# Patient Record
Sex: Female | Born: 1960 | Race: White | Hispanic: No | Smoking: Former smoker
Health system: Southern US, Community
[De-identification: ages and names within clinical notes are randomized; demographics above are authoritative.]

## PROBLEM LIST (undated history)

## (undated) ENCOUNTER — Emergency Department (HOSPITAL_COMMUNITY)

## (undated) ENCOUNTER — Emergency Department (HOSPITAL_COMMUNITY): Admission: EM | Discharge status: Absent without leave | Payer: Medicare HMO | Source: Home / Self Care

## (undated) DIAGNOSIS — M419 Scoliosis, unspecified: Secondary | ICD-10-CM

## (undated) DIAGNOSIS — E785 Hyperlipidemia, unspecified: Secondary | ICD-10-CM

## (undated) DIAGNOSIS — J439 Emphysema, unspecified: Secondary | ICD-10-CM

## (undated) DIAGNOSIS — K219 Gastro-esophageal reflux disease without esophagitis: Secondary | ICD-10-CM

## (undated) DIAGNOSIS — Z9981 Dependence on supplemental oxygen: Secondary | ICD-10-CM

## (undated) DIAGNOSIS — R0902 Hypoxemia: Secondary | ICD-10-CM

## (undated) DIAGNOSIS — F329 Major depressive disorder, single episode, unspecified: Secondary | ICD-10-CM

## (undated) DIAGNOSIS — C801 Malignant (primary) neoplasm, unspecified: Secondary | ICD-10-CM

## (undated) DIAGNOSIS — Z923 Personal history of irradiation: Secondary | ICD-10-CM

## (undated) DIAGNOSIS — F32A Depression, unspecified: Secondary | ICD-10-CM

## (undated) DIAGNOSIS — F419 Anxiety disorder, unspecified: Secondary | ICD-10-CM

## (undated) DIAGNOSIS — J449 Chronic obstructive pulmonary disease, unspecified: Secondary | ICD-10-CM

## (undated) HISTORY — DX: Gastro-esophageal reflux disease without esophagitis: K21.9

## (undated) HISTORY — PX: TUBAL LIGATION: SHX77

## (undated) HISTORY — DX: Hypoxemia: R09.02

## (undated) HISTORY — DX: Scoliosis, unspecified: M41.9

## (undated) HISTORY — DX: Emphysema, unspecified: J43.9

---

## 2002-01-09 ENCOUNTER — Encounter: Payer: Self-pay | Admitting: Internal Medicine

## 2002-01-09 ENCOUNTER — Ambulatory Visit (HOSPITAL_COMMUNITY): Admission: RE | Admit: 2002-01-09 | Discharge: 2002-01-09 | Payer: Self-pay | Admitting: Internal Medicine

## 2002-09-29 ENCOUNTER — Ambulatory Visit (HOSPITAL_COMMUNITY): Admission: RE | Admit: 2002-09-29 | Discharge: 2002-09-29 | Payer: Self-pay | Admitting: Internal Medicine

## 2002-09-29 ENCOUNTER — Encounter: Payer: Self-pay | Admitting: Internal Medicine

## 2002-10-03 ENCOUNTER — Ambulatory Visit (HOSPITAL_COMMUNITY): Admission: RE | Admit: 2002-10-03 | Discharge: 2002-10-03 | Payer: Self-pay | Admitting: Internal Medicine

## 2002-10-16 ENCOUNTER — Ambulatory Visit (HOSPITAL_COMMUNITY): Admission: RE | Admit: 2002-10-16 | Discharge: 2002-10-16 | Payer: Self-pay | Admitting: Internal Medicine

## 2003-01-11 ENCOUNTER — Ambulatory Visit (HOSPITAL_COMMUNITY): Admission: RE | Admit: 2003-01-11 | Discharge: 2003-01-11 | Payer: Self-pay | Admitting: Internal Medicine

## 2003-01-18 ENCOUNTER — Ambulatory Visit (HOSPITAL_COMMUNITY): Admission: RE | Admit: 2003-01-18 | Discharge: 2003-01-18 | Payer: Self-pay | Admitting: Internal Medicine

## 2004-01-14 ENCOUNTER — Ambulatory Visit (HOSPITAL_COMMUNITY): Admission: RE | Admit: 2004-01-14 | Discharge: 2004-01-14 | Payer: Self-pay | Admitting: Internal Medicine

## 2005-01-20 ENCOUNTER — Ambulatory Visit (HOSPITAL_COMMUNITY): Admission: RE | Admit: 2005-01-20 | Discharge: 2005-01-20 | Payer: Self-pay | Admitting: Internal Medicine

## 2006-01-22 ENCOUNTER — Ambulatory Visit (HOSPITAL_COMMUNITY): Admission: RE | Admit: 2006-01-22 | Discharge: 2006-01-22 | Payer: Self-pay | Admitting: Internal Medicine

## 2007-01-24 ENCOUNTER — Ambulatory Visit (HOSPITAL_COMMUNITY): Admission: RE | Admit: 2007-01-24 | Discharge: 2007-01-24 | Payer: Self-pay | Admitting: Internal Medicine

## 2007-06-06 ENCOUNTER — Other Ambulatory Visit: Admission: RE | Admit: 2007-06-06 | Discharge: 2007-06-06 | Payer: Self-pay | Admitting: Obstetrics and Gynecology

## 2007-06-26 ENCOUNTER — Emergency Department (HOSPITAL_COMMUNITY): Admission: EM | Admit: 2007-06-26 | Discharge: 2007-06-26 | Payer: Self-pay | Admitting: Emergency Medicine

## 2008-02-15 ENCOUNTER — Ambulatory Visit (HOSPITAL_COMMUNITY): Admission: RE | Admit: 2008-02-15 | Discharge: 2008-02-15 | Payer: Self-pay | Admitting: Family Medicine

## 2009-01-07 ENCOUNTER — Other Ambulatory Visit: Admission: RE | Admit: 2009-01-07 | Discharge: 2009-01-07 | Payer: Self-pay | Admitting: Obstetrics & Gynecology

## 2009-02-18 ENCOUNTER — Ambulatory Visit (HOSPITAL_COMMUNITY): Admission: RE | Admit: 2009-02-18 | Discharge: 2009-02-18 | Payer: Self-pay | Admitting: Family Medicine

## 2010-01-24 ENCOUNTER — Other Ambulatory Visit (HOSPITAL_COMMUNITY): Payer: Self-pay | Admitting: Family Medicine

## 2010-01-24 DIAGNOSIS — Z1239 Encounter for other screening for malignant neoplasm of breast: Secondary | ICD-10-CM

## 2010-01-26 ENCOUNTER — Encounter: Payer: Self-pay | Admitting: Family Medicine

## 2010-02-24 ENCOUNTER — Ambulatory Visit (HOSPITAL_COMMUNITY): Payer: Medicare Other

## 2010-05-05 ENCOUNTER — Ambulatory Visit (HOSPITAL_COMMUNITY)
Admission: RE | Admit: 2010-05-05 | Discharge: 2010-05-05 | Disposition: A | Discharge status: Home / Self Care | Payer: Medicare Other | Source: Ambulatory Visit | Attending: Family Medicine | Admitting: Family Medicine

## 2010-05-05 ENCOUNTER — Other Ambulatory Visit (HOSPITAL_COMMUNITY): Payer: Self-pay | Admitting: Family Medicine

## 2010-05-05 DIAGNOSIS — R634 Abnormal weight loss: Secondary | ICD-10-CM

## 2010-05-05 DIAGNOSIS — R059 Cough, unspecified: Secondary | ICD-10-CM | POA: Insufficient documentation

## 2010-05-05 DIAGNOSIS — F17200 Nicotine dependence, unspecified, uncomplicated: Secondary | ICD-10-CM | POA: Insufficient documentation

## 2010-05-05 DIAGNOSIS — R05 Cough: Secondary | ICD-10-CM | POA: Insufficient documentation

## 2010-05-19 ENCOUNTER — Ambulatory Visit (HOSPITAL_COMMUNITY): Payer: Medicare Other

## 2010-05-23 NOTE — Procedures (Signed)
   NAMELISSANDRA, Donna Watkins NO.:  [id number]   MEDICAL RECORD NO.:  [id number]                    PATIENT TYPE:   LOCATION:                                       FACILITY:   PHYSICIAN:  Edward L. Juanetta Gosling, M.D.             DATE OF BIRTH:   DATE OF PROCEDURE:  DATE OF DISCHARGE:                              PULMONARY FUNCTION TEST   IMPRESSION:  1. Spirometry shows a moderate ventilatory defect with evidence of airflow     obstruction.  2. Lung  volumes show no restrictive change, but marked air trapping.  3. DLCO is severely reduced.  4. Arterial blood gas shows marked resting hypoxemia as well as increased     pCO2.      ___________________________________________                                            Oneal Deputy. Juanetta Gosling, M.D.   ELH/MEDQ  D:  10/06/2002  T:  10/06/2002  Job:  [id number]   cc:   Tesfaye D. Felecia Shelling, M.D.  [address]  [place]  [place] [address]  Fax: [phone]

## 2010-05-26 ENCOUNTER — Ambulatory Visit (HOSPITAL_COMMUNITY)
Admission: RE | Admit: 2010-05-26 | Discharge: 2010-05-26 | Disposition: A | Discharge status: Home / Self Care | Payer: Medicare Other | Source: Ambulatory Visit | Attending: Family Medicine | Admitting: Family Medicine

## 2010-05-26 DIAGNOSIS — Z1231 Encounter for screening mammogram for malignant neoplasm of breast: Secondary | ICD-10-CM | POA: Insufficient documentation

## 2010-05-26 DIAGNOSIS — Z1239 Encounter for other screening for malignant neoplasm of breast: Secondary | ICD-10-CM

## 2010-10-02 LAB — DIFFERENTIAL
Basophils Absolute: 0.1
Basophils Relative: 1
Eosinophils Absolute: 0
Eosinophils Relative: 0
Lymphocytes Relative: 17
Monocytes Absolute: 0.5
Monocytes Relative: 4
Neutrophils Relative %: 77

## 2010-10-02 LAB — POCT CARDIAC MARKERS
CKMB, poc: 1.2
Myoglobin, poc: 60.2
Operator id: 237661
Troponin i, poc: <0.05

## 2010-10-02 LAB — BASIC METABOLIC PANEL
BUN: 4 — ABNORMAL LOW
CO2: 30
Calcium: 8.9
Chloride: 105
Creatinine, Ser: 0.64
GFR calc Af Amer: >60
GFR calc non Af Amer: >60
Glucose, Bld: 112 — ABNORMAL HIGH
Potassium: 3.6
Sodium: 139

## 2010-10-02 LAB — CBC
HCT: 48.2 — ABNORMAL HIGH
Hemoglobin: 16.4 — ABNORMAL HIGH
MCHC: 34.1
MCV: 93.3
Platelets: 256
RBC: 5.16 — ABNORMAL HIGH
RDW: 13.3
WBC: 10.8 — ABNORMAL HIGH

## 2011-06-12 ENCOUNTER — Inpatient Hospital Stay (HOSPITAL_COMMUNITY)
Admission: EM | Admit: 2011-06-12 | Discharge: 2011-06-16 | DRG: 189 | Disposition: A | Discharge status: Home / Self Care | Payer: Medicare Other | Attending: Family Medicine | Admitting: Family Medicine

## 2011-06-12 ENCOUNTER — Encounter (HOSPITAL_COMMUNITY): Payer: Self-pay | Admitting: *Deleted

## 2011-06-12 ENCOUNTER — Emergency Department (HOSPITAL_COMMUNITY): Payer: Medicare Other

## 2011-06-12 DIAGNOSIS — I498 Other specified cardiac arrhythmias: Secondary | ICD-10-CM | POA: Diagnosis present

## 2011-06-12 DIAGNOSIS — G935 Compression of brain: Secondary | ICD-10-CM | POA: Diagnosis present

## 2011-06-12 DIAGNOSIS — R911 Solitary pulmonary nodule: Secondary | ICD-10-CM | POA: Diagnosis present

## 2011-06-12 DIAGNOSIS — Z79899 Other long term (current) drug therapy: Secondary | ICD-10-CM

## 2011-06-12 DIAGNOSIS — J9600 Acute respiratory failure, unspecified whether with hypoxia or hypercapnia: Secondary | ICD-10-CM | POA: Diagnosis not present

## 2011-06-12 DIAGNOSIS — E876 Hypokalemia: Secondary | ICD-10-CM | POA: Diagnosis not present

## 2011-06-12 DIAGNOSIS — D509 Iron deficiency anemia, unspecified: Secondary | ICD-10-CM | POA: Diagnosis present

## 2011-06-12 DIAGNOSIS — E860 Dehydration: Secondary | ICD-10-CM | POA: Diagnosis present

## 2011-06-12 DIAGNOSIS — D751 Secondary polycythemia: Secondary | ICD-10-CM | POA: Diagnosis present

## 2011-06-12 DIAGNOSIS — J9692 Respiratory failure, unspecified with hypercapnia: Secondary | ICD-10-CM

## 2011-06-12 DIAGNOSIS — F329 Major depressive disorder, single episode, unspecified: Secondary | ICD-10-CM | POA: Diagnosis present

## 2011-06-12 DIAGNOSIS — J449 Chronic obstructive pulmonary disease, unspecified: Secondary | ICD-10-CM

## 2011-06-12 DIAGNOSIS — J441 Chronic obstructive pulmonary disease with (acute) exacerbation: Secondary | ICD-10-CM | POA: Diagnosis present

## 2011-06-12 DIAGNOSIS — D7589 Other specified diseases of blood and blood-forming organs: Secondary | ICD-10-CM | POA: Diagnosis present

## 2011-06-12 DIAGNOSIS — I959 Hypotension, unspecified: Secondary | ICD-10-CM | POA: Diagnosis present

## 2011-06-12 DIAGNOSIS — IMO0002 Reserved for concepts with insufficient information to code with codable children: Secondary | ICD-10-CM

## 2011-06-12 DIAGNOSIS — R0602 Shortness of breath: Secondary | ICD-10-CM | POA: Diagnosis not present

## 2011-06-12 DIAGNOSIS — J9611 Chronic respiratory failure with hypoxia: Secondary | ICD-10-CM | POA: Diagnosis present

## 2011-06-12 DIAGNOSIS — R519 Headache, unspecified: Secondary | ICD-10-CM | POA: Diagnosis present

## 2011-06-12 DIAGNOSIS — J9620 Acute and chronic respiratory failure, unspecified whether with hypoxia or hypercapnia: Principal | ICD-10-CM | POA: Diagnosis present

## 2011-06-12 DIAGNOSIS — R634 Abnormal weight loss: Secondary | ICD-10-CM | POA: Diagnosis present

## 2011-06-12 DIAGNOSIS — J9601 Acute respiratory failure with hypoxia: Secondary | ICD-10-CM | POA: Diagnosis present

## 2011-06-12 DIAGNOSIS — F341 Dysthymic disorder: Secondary | ICD-10-CM | POA: Diagnosis present

## 2011-06-12 DIAGNOSIS — R51 Headache: Secondary | ICD-10-CM | POA: Diagnosis not present

## 2011-06-12 DIAGNOSIS — Q0700 Arnold-Chiari syndrome without spina bifida or hydrocephalus: Secondary | ICD-10-CM | POA: Diagnosis present

## 2011-06-12 DIAGNOSIS — F32A Depression, unspecified: Secondary | ICD-10-CM | POA: Diagnosis present

## 2011-06-12 DIAGNOSIS — J4489 Other specified chronic obstructive pulmonary disease: Secondary | ICD-10-CM | POA: Diagnosis not present

## 2011-06-12 DIAGNOSIS — R0609 Other forms of dyspnea: Secondary | ICD-10-CM | POA: Diagnosis not present

## 2011-06-12 DIAGNOSIS — R0902 Hypoxemia: Secondary | ICD-10-CM | POA: Diagnosis present

## 2011-06-12 DIAGNOSIS — R001 Bradycardia, unspecified: Secondary | ICD-10-CM | POA: Diagnosis present

## 2011-06-12 DIAGNOSIS — J9612 Chronic respiratory failure with hypercapnia: Secondary | ICD-10-CM

## 2011-06-12 HISTORY — DX: Depression, unspecified: F32.A

## 2011-06-12 HISTORY — DX: Hyperlipidemia, unspecified: E78.5

## 2011-06-12 HISTORY — DX: Major depressive disorder, single episode, unspecified: F32.9

## 2011-06-12 HISTORY — DX: Anxiety disorder, unspecified: F41.9

## 2011-06-12 HISTORY — DX: Chronic obstructive pulmonary disease, unspecified: J44.9

## 2011-06-12 MED ORDER — METHYLPREDNISOLONE SODIUM SUCC 125 MG IJ SOLR
125.0000 mg | Freq: Once | INTRAMUSCULAR | Status: AC
  Administered 2011-06-13: 125 mg via INTRAVENOUS
  Filled 2011-06-12: qty 2

## 2011-06-12 MED ORDER — ALBUTEROL SULFATE (5 MG/ML) 0.5% IN NEBU
2.5000 mg | INHALATION_SOLUTION | Freq: Once | RESPIRATORY_TRACT | Status: AC
  Administered 2011-06-12: 2.5 mg via RESPIRATORY_TRACT
  Filled 2011-06-12: qty 0.5

## 2011-06-12 MED ORDER — IPRATROPIUM BROMIDE 0.02 % IN SOLN
0.5000 mg | Freq: Once | RESPIRATORY_TRACT | Status: AC
  Administered 2011-06-12: 0.5 mg via RESPIRATORY_TRACT
  Filled 2011-06-12: qty 2.5

## 2011-06-12 MED ORDER — SODIUM CHLORIDE 0.9 % IV SOLN
INTRAVENOUS | Status: DC
  Administered 2011-06-13: via INTRAVENOUS

## 2011-06-12 NOTE — ED Provider Notes (Addendum)
History   This chart was scribed for Shelda Jakes, MD by Charolett Bumpers . The patient was seen in room APA08/APA08.    CSN: [id number]  Arrival date & time 06/12/11  2306   First MD Initiated Contact with Patient 06/12/11 2308      Chief Complaint  Patient presents with  . Shortness of Breath    (Consider location/radiation/quality/duration/timing/severity/associated sxs/prior treatment) HPI Donna Watkins is a 51 y.o. female who presents to the Emergency Department complaining of intermittent, moderate SOB for the past 2 months, with it worsening over the last 2 nights. Patient reports a h/o COPD and Emphysema. Patient state that she uses an inhaler, but denies using oxygen at home. Patient denies taking prednisone. Patient reports an associated productive cough and diaphoresis. Patient also reports diarrhea, unexpected weight loss and loss of appetite for the past several weeks and states Dr. Sudie Bailey is aware. Patient also reports an associated headache for the past several weeks. Patient states that she is a smoker, pack/day. Patient states that she has breathing treatment regularly, but states she did not do a breathing treatment today.   PCP: Dr. Sudie Bailey  No past medical history on file.  No past surgical history on file.  No family history on file.  History  Substance Use Topics  . Smoking status: Not on file  . Smokeless tobacco: Not on file  . Alcohol Use: Not on file    OB History    No data available      Review of Systems  Constitutional: Positive for diaphoresis, appetite change and unexpected weight change. Negative for fever.  HENT: Negative for congestion, sore throat and neck pain.   Respiratory: Positive for cough and shortness of breath.   Cardiovascular: Negative for chest pain and leg swelling.  Gastrointestinal: Positive for diarrhea. Negative for nausea, vomiting and abdominal pain.  Genitourinary: Negative for dysuria.    Musculoskeletal: Negative for back pain.  Skin: Negative for rash.  Neurological: Positive for headaches.  All other systems reviewed and are negative.    Allergies  Review of patient's allergies indicates not on file.  Home Medications  No current outpatient prescriptions on file.  BP 122/77  Pulse 98  Temp 98.7 F (37.1 C)  Resp 24  Ht 5\' 3"  (1.6 m)  Wt 133 lb (60.328 kg)  BMI 23.56 kg/m2  SpO2 70%  Physical Exam  Nursing note and vitals reviewed. Constitutional: She is oriented to person, place, and time. She appears well-developed and well-nourished. No distress.  HENT:  Head: Normocephalic and atraumatic.  Mouth/Throat: Oropharynx is clear and moist.       Mucous membranes moist.   Eyes: EOM are normal. Pupils are equal, round, and reactive to light.  Neck: Normal range of motion. Neck supple. No tracheal deviation present.  Cardiovascular: Normal rate, regular rhythm and normal heart sounds.   No murmur heard. Pulmonary/Chest: No respiratory distress. She has no wheezes.       Minimal air movement.   Abdominal: Soft. Bowel sounds are normal. She exhibits no distension. There is no tenderness.  Musculoskeletal: Normal range of motion. She exhibits no edema.  Lymphadenopathy:    She has no cervical adenopathy.  Neurological: She is alert and oriented to person, place, and time. No cranial nerve deficit or sensory deficit.  Skin: Skin is warm and dry.       Cap refill is 1 sec. Lips were initially cyanotic, but resolved with oxygen.   Psychiatric: She  has a normal mood and affect. Her behavior is normal.    ED Course  Procedures (including critical care time)  DIAGNOSTIC STUDIES: Oxygen Saturation is 70% on 6 L [place], low by my interpretation.    COORDINATION OF CARE:  2332: Discussed planned course of treatment with the patient and family, who is agreeable at this time.  2345: Medication Orders: Methylprednisolone sodium succinate (Solu-medrol) 125 mg/2 mL  injection 125 mg-once; Ipratropium (Atrovent) nebulizer solution 0.5 mg-once; Albuterol (Proventil) (5 mg/mL) 0.5% nebulizer solution 2.5 mg-once 0000: Medication Orders: 0.9% Sodium chloride infusion-continuous.    Labs Reviewed - No data to display No results found.   No diagnosis found.  CRITICAL CARE Performed by: Shelda Jakes.   Total critical care time: 60  Critical care time was exclusive of separately billable procedures and treating other patients.  Critical care was necessary to treat or prevent imminent or life-threatening deterioration.  Critical care was time spent personally by me on the following activities: development of treatment plan with patient and/or surrogate as well as nursing, discussions with consultants, evaluation of patient's response to treatment, examination of patient, obtaining history from patient or surrogate, ordering and performing treatments and interventions, ordering and review of laboratory studies, ordering and review of radiographic studies, pulse oximetry and re-evaluation of patient's condition.   MDM  Patient with known history of COPD. Arrived cyanotic room air oxygen saturation was 70%. Patient was alert and mentating and oriented. In ED started on 4 L of oxygen she's and sats only up into the 80s oxygen raised to 6 L oxygen saturation slowly improved to around 90%. No wheezing on exam however poor air movement bilaterally. Patient does have a nebulizer treatments at home has been using them. Patient also with a complaint of headache for 2 weeks mild not severe no injury. The headaches resulted in getting a head CT thinking more in terms since a heavy smoker possible metastatic disease to the brain. Head CT raised concern for subarachnoid hemorrhage and/or edema. This did not fit her clinical scenario. Discussed with neurosurgery at [place] they were not overly impressed with a head CT did not feel that it is a subarachnoid hemorrhage  there is significant edema however do agree it's not normal and recommended MRI/MRA when possible. Felt the patient could be admitted here. For the pulmonary stuff the patient was started on BiPAP initial blood gas showed a markedly elevated PCO2 surprisingly patient mentating fine suspect that the elevated PCO2 has been chronic and not acute. On the BiPAP patient improves significantly oxygen sats up and skin color improved to a pink color once again patient was never in any acute distress never showed any focal neural deficits or acute mental status changes. Repeat blood gas after being on the BiPAP showed a PCO2 of 71 PO2 of 178. Patient's pH was not significantly acidotic. We'll get CT angios to rule out PE had discussed the case with the hospitalist they are admitting for Dr. Sudie Bailey this week he is on vacation. After this CT angios will recontact them they are concerned about the head CT findings have confirmed with radiology here the MRI will not be available over the weekend patient may need to be transferred to [facility] for admission to the internal medicine hospitalist service there. Updates to come.  CT angiogram negative for pulmonary embolism or any other significant pulmonary findings other than evidence of emphysema. Hospitalist will come and see the patient here will most likely transfer patient to [facility] portion  get an MRI when off the BiPAP. Did confirm not able to get an MRI here all weekend long. Patient remains alert tolerating BiPAP fine.   I personally performed the services described in this documentation, which was scribed in my presence. The recorded information has been reviewed and considered.         Shelda Jakes, MD 06/13/11 1610  Shelda Jakes, MD 06/13/11 2142

## 2011-06-12 NOTE — ED Notes (Signed)
O2 Saturation increased to 94 on 6lpm.

## 2011-06-12 NOTE — ED Notes (Addendum)
Pt reports increasing SOB.  Also reporting diarrhea and loss of appetite for several days.  Pt's lips very cyanotic.  Placed on oxygen. Pt denies home 02.  Reports she has been using nebulizer treatments. Pt O2 saturations 40% on room air.

## 2011-06-12 NOTE — ED Notes (Signed)
EDP at bedside. Pt O2 Saturations increased to 87% on 6 lpm.

## 2011-06-13 ENCOUNTER — Encounter (HOSPITAL_COMMUNITY): Payer: Self-pay | Admitting: Internal Medicine

## 2011-06-13 ENCOUNTER — Inpatient Hospital Stay (HOSPITAL_COMMUNITY): Payer: Medicare Other

## 2011-06-13 ENCOUNTER — Emergency Department (HOSPITAL_COMMUNITY): Payer: Medicare Other

## 2011-06-13 DIAGNOSIS — D7589 Other specified diseases of blood and blood-forming organs: Secondary | ICD-10-CM | POA: Diagnosis present

## 2011-06-13 DIAGNOSIS — E876 Hypokalemia: Secondary | ICD-10-CM | POA: Diagnosis present

## 2011-06-13 DIAGNOSIS — R634 Abnormal weight loss: Secondary | ICD-10-CM | POA: Diagnosis present

## 2011-06-13 DIAGNOSIS — J9601 Acute respiratory failure with hypoxia: Secondary | ICD-10-CM | POA: Diagnosis present

## 2011-06-13 DIAGNOSIS — J9612 Chronic respiratory failure with hypercapnia: Secondary | ICD-10-CM | POA: Diagnosis present

## 2011-06-13 DIAGNOSIS — J441 Chronic obstructive pulmonary disease with (acute) exacerbation: Secondary | ICD-10-CM | POA: Diagnosis present

## 2011-06-13 DIAGNOSIS — J9611 Chronic respiratory failure with hypoxia: Secondary | ICD-10-CM | POA: Diagnosis present

## 2011-06-13 DIAGNOSIS — R0602 Shortness of breath: Secondary | ICD-10-CM | POA: Diagnosis not present

## 2011-06-13 DIAGNOSIS — F329 Major depressive disorder, single episode, unspecified: Secondary | ICD-10-CM | POA: Diagnosis present

## 2011-06-13 DIAGNOSIS — F32A Depression, unspecified: Secondary | ICD-10-CM | POA: Diagnosis present

## 2011-06-13 DIAGNOSIS — R519 Headache, unspecified: Secondary | ICD-10-CM | POA: Diagnosis present

## 2011-06-13 DIAGNOSIS — R51 Headache: Secondary | ICD-10-CM | POA: Diagnosis present

## 2011-06-13 DIAGNOSIS — Q0700 Arnold-Chiari syndrome without spina bifida or hydrocephalus: Secondary | ICD-10-CM | POA: Diagnosis present

## 2011-06-13 LAB — HEPATIC FUNCTION PANEL
ALT: 13 U/L (ref 0–35)
AST: 15 U/L (ref 0–37)
Albumin: 3.4 g/dL — ABNORMAL LOW (ref 3.5–5.2)
Alkaline Phosphatase: 66 U/L (ref 39–117)
Bilirubin, Direct: 0.2 mg/dL (ref 0.0–0.3)
Indirect Bilirubin: 0.7 mg/dL (ref 0.3–0.9)
Total Protein: 6.5 g/dL (ref 6.0–8.3)

## 2011-06-13 LAB — BLOOD GAS, ARTERIAL
Acid-Base Excess: 18.2 mmol/L — ABNORMAL HIGH (ref 0.0–2.0)
Bicarbonate: 41.3 mEq/L — ABNORMAL HIGH (ref 20.0–24.0)
Bicarbonate: 44.5 mEq/L — ABNORMAL HIGH (ref 20.0–24.0)
Delivery systems: POSITIVE
O2 Content: 6 L/min
O2 Saturation: 89.7 %
O2 Saturation: 98.9 %
Patient temperature: 37
TCO2: 34.4 mmol/L (ref 0–100)
pCO2 arterial: 71.9 mmHg (ref 35.0–45.0)
pCO2 arterial: 78.8 mmHg (ref 35.0–45.0)
pH, Arterial: 7.377 (ref 7.350–7.400)
pO2, Arterial: 63.5 mmHg — ABNORMAL LOW (ref 80.0–100.0)

## 2011-06-13 LAB — URINALYSIS, ROUTINE W REFLEX MICROSCOPIC
Glucose, UA: NEGATIVE mg/dL
Ketones, ur: NEGATIVE mg/dL
Leukocytes, UA: NEGATIVE
Nitrite: NEGATIVE
Protein, ur: 30 mg/dL — AB
Specific Gravity, Urine: >1.03 — ABNORMAL HIGH (ref 1.005–1.030)
pH: 6 (ref 5.0–8.0)

## 2011-06-13 LAB — URINE MICROSCOPIC-ADD ON

## 2011-06-13 LAB — BASIC METABOLIC PANEL
BUN: 8 mg/dL (ref 6–23)
Chloride: 91 mEq/L — ABNORMAL LOW (ref 96–112)
Glucose, Bld: 128 mg/dL — ABNORMAL HIGH (ref 70–99)
Potassium: 2.8 mEq/L — ABNORMAL LOW (ref 3.5–5.1)
Sodium: 141 mEq/L (ref 135–145)

## 2011-06-13 LAB — DIFFERENTIAL
Basophils Absolute: 0 10*3/uL (ref 0.0–0.1)
Basophils Relative: 0 % (ref 0–1)
Eosinophils Absolute: 0 10*3/uL (ref 0.0–0.7)
Eosinophils Relative: 1 % (ref 0–5)
Lymphocytes Relative: 22 % (ref 12–46)
Lymphs Abs: 1.1 10*3/uL (ref 0.7–4.0)
Monocytes Absolute: 0.4 10*3/uL (ref 0.1–1.0)
Monocytes Relative: 8 % (ref 3–12)
Neutrophils Relative %: 69 % (ref 43–77)

## 2011-06-13 LAB — CBC
Hemoglobin: 18.9 g/dL — ABNORMAL HIGH (ref 12.0–15.0)
MCH: 32.6 pg (ref 26.0–34.0)
MCHC: 31.8 g/dL (ref 30.0–36.0)
MCV: 102.6 fL — ABNORMAL HIGH (ref 78.0–100.0)
Platelets: 180 10*3/uL (ref 150–400)
WBC: 5.1 10*3/uL (ref 4.0–10.5)

## 2011-06-13 LAB — MRSA PCR SCREENING: MRSA by PCR: NEGATIVE

## 2011-06-13 LAB — MAGNESIUM: Magnesium: 1.6 mg/dL (ref 1.5–2.5)

## 2011-06-13 LAB — TSH: TSH: 0.428 u[IU]/mL (ref 0.350–4.500)

## 2011-06-13 MED ORDER — BIOTENE DRY MOUTH MT LIQD
15.0000 mL | Freq: Two times a day (BID) | OROMUCOSAL | Status: DC
  Administered 2011-06-14 – 2011-06-16 (×5): 15 mL via OROMUCOSAL

## 2011-06-13 MED ORDER — ONDANSETRON HCL 4 MG/2ML IJ SOLN: 4.0000 mg | Freq: Four times a day (QID) | INTRAMUSCULAR | Status: DC | PRN

## 2011-06-13 MED ORDER — ACETAMINOPHEN 325 MG PO TABS: 650.0000 mg | ORAL_TABLET | Freq: Four times a day (QID) | ORAL | Status: DC | PRN

## 2011-06-13 MED ORDER — LEVOFLOXACIN 500 MG PO TABS
500.0000 mg | ORAL_TABLET | ORAL | Status: DC
  Administered 2011-06-14 – 2011-06-16 (×3): 500 mg via ORAL
  Filled 2011-06-13 (×4): qty 1

## 2011-06-13 MED ORDER — POTASSIUM CHLORIDE CRYS ER 20 MEQ PO TBCR
40.0000 meq | EXTENDED_RELEASE_TABLET | Freq: Once | ORAL | Status: AC
  Administered 2011-06-13: 40 meq via ORAL
  Filled 2011-06-13: qty 2

## 2011-06-13 MED ORDER — ALBUTEROL SULFATE (5 MG/ML) 0.5% IN NEBU
2.5000 mg | INHALATION_SOLUTION | RESPIRATORY_TRACT | Status: DC
  Administered 2011-06-13 – 2011-06-15 (×13): 2.5 mg via RESPIRATORY_TRACT
  Filled 2011-06-13 (×13): qty 0.5

## 2011-06-13 MED ORDER — ALPRAZOLAM 0.5 MG PO TABS: 0.5000 mg | ORAL_TABLET | Freq: Three times a day (TID) | ORAL | Status: DC | PRN

## 2011-06-13 MED ORDER — POTASSIUM CHLORIDE 10 MEQ/100ML IV SOLN
10.0000 meq | Freq: Once | INTRAVENOUS | Status: AC
  Administered 2011-06-13: 10 meq via INTRAVENOUS

## 2011-06-13 MED ORDER — FLUTICASONE-SALMETEROL 250-50 MCG/DOSE IN AEPB
1.0000 | INHALATION_SPRAY | Freq: Two times a day (BID) | RESPIRATORY_TRACT | Status: DC
  Administered 2011-06-13 – 2011-06-15 (×4): 1 via RESPIRATORY_TRACT
  Filled 2011-06-13: qty 14

## 2011-06-13 MED ORDER — SODIUM CHLORIDE 0.9 % IV BOLUS (SEPSIS)
500.0000 mL | Freq: Once | INTRAVENOUS | Status: AC
  Administered 2011-06-13: 500 mL via INTRAVENOUS

## 2011-06-13 MED ORDER — ONDANSETRON HCL 4 MG PO TABS: 4.0000 mg | ORAL_TABLET | Freq: Four times a day (QID) | ORAL | Status: DC | PRN

## 2011-06-13 MED ORDER — GADOBENATE DIMEGLUMINE 529 MG/ML IV SOLN
13.0000 mL | Freq: Once | INTRAVENOUS | Status: AC | PRN
  Administered 2011-06-13: 13 mL via INTRAVENOUS

## 2011-06-13 MED ORDER — ALBUTEROL SULFATE (5 MG/ML) 0.5% IN NEBU: 2.5000 mg | INHALATION_SOLUTION | RESPIRATORY_TRACT | Status: DC | PRN

## 2011-06-13 MED ORDER — LEVOFLOXACIN IN D5W 500 MG/100ML IV SOLN
500.0000 mg | Freq: Once | INTRAVENOUS | Status: AC
  Administered 2011-06-13: 500 mg via INTRAVENOUS
  Filled 2011-06-13: qty 100

## 2011-06-13 MED ORDER — PANTOPRAZOLE SODIUM 40 MG PO TBEC
40.0000 mg | DELAYED_RELEASE_TABLET | Freq: Every day | ORAL | Status: DC
  Administered 2011-06-13 – 2011-06-16 (×4): 40 mg via ORAL
  Filled 2011-06-13 (×5): qty 1

## 2011-06-13 MED ORDER — OXYCODONE HCL 5 MG PO TABS: 5.0000 mg | ORAL_TABLET | ORAL | Status: DC | PRN

## 2011-06-13 MED ORDER — POTASSIUM CHLORIDE 10 MEQ/100ML IV SOLN
10.0000 meq | Freq: Once | INTRAVENOUS | Status: AC
Start: 2011-06-13 — End: 2011-06-13
  Administered 2011-06-13: 10 meq via INTRAVENOUS
  Filled 2011-06-13 (×2): qty 100

## 2011-06-13 MED ORDER — NICOTINE 21 MG/24HR TD PT24
21.0000 mg | MEDICATED_PATCH | Freq: Every day | TRANSDERMAL | Status: DC
  Administered 2011-06-13 – 2011-06-15 (×3): 21 mg via TRANSDERMAL
  Filled 2011-06-13 (×4): qty 1

## 2011-06-13 MED ORDER — LEVOFLOXACIN IN D5W 500 MG/100ML IV SOLN: 500.0000 mg | INTRAVENOUS | Status: DC

## 2011-06-13 MED ORDER — ACETAMINOPHEN 650 MG RE SUPP: 650.0000 mg | Freq: Four times a day (QID) | RECTAL | Status: DC | PRN

## 2011-06-13 MED ORDER — METHYLPREDNISOLONE SODIUM SUCC 125 MG IJ SOLR
80.0000 mg | Freq: Four times a day (QID) | INTRAMUSCULAR | Status: DC
  Administered 2011-06-13 – 2011-06-14 (×5): 80 mg via INTRAVENOUS
  Filled 2011-06-13 (×2): qty 2
  Filled 2011-06-13: qty 1.28
  Filled 2011-06-13: qty 2
  Filled 2011-06-13 (×4): qty 1.28

## 2011-06-13 MED ORDER — ALPRAZOLAM 0.5 MG PO TABS
1.0000 mg | ORAL_TABLET | Freq: Four times a day (QID) | ORAL | Status: DC | PRN
  Administered 2011-06-13 – 2011-06-16 (×10): 1 mg via ORAL
  Filled 2011-06-13 (×11): qty 2

## 2011-06-13 MED ORDER — IOHEXOL 350 MG/ML SOLN
100.0000 mL | Freq: Once | INTRAVENOUS | Status: AC | PRN
  Administered 2011-06-13: 100 mL via INTRAVENOUS

## 2011-06-13 MED ORDER — IPRATROPIUM BROMIDE 0.02 % IN SOLN
0.5000 mg | RESPIRATORY_TRACT | Status: DC
  Administered 2011-06-13 – 2011-06-15 (×13): 0.5 mg via RESPIRATORY_TRACT
  Filled 2011-06-13 (×13): qty 2.5

## 2011-06-13 MED ORDER — POTASSIUM CHLORIDE IN NACL 20-0.9 MEQ/L-% IV SOLN
INTRAVENOUS | Status: DC
  Filled 2011-06-13 (×2): qty 1000

## 2011-06-13 NOTE — ED Notes (Signed)
[facility] to department to transfer pt.

## 2011-06-13 NOTE — ED Notes (Signed)
Report given to [facility]  

## 2011-06-13 NOTE — ED Notes (Signed)
CRITICAL VALUE ALERT  Critical value received:  ABG result  Date of notification:  06/13/2011  Time of notification: 0008  Critical value read back: Yes  Nurse who received alert. Mendel Corning RN  EDP notified at [employer]

## 2011-06-13 NOTE — Progress Notes (Signed)
[facility] PROGRESS NOTE  Donna Watkins YNW:[id number] DOB: [birth year] DOA: 06/12/2011 PCP: Milana Obey, MD, MD  Assessment/Plan: 1. COPD exacerbation in a patient with severe emphysema on Ct scan and chronic CO2 retention suggestive of chronic respiratory failure  - improving - off Bipap - oxygenation is better - c/w steroids, abx, nebs 2. Abnormal Head CT - ? Significance - no neuro defficits - no headache - will get MRI brain  3. Hypokalemia - replete and folllow bmet 4. Lung nodule - outpt F/U  5. Gastric mucosa thickening  - Ct abdomen ordered for tomorrow . C/w PPI 6. Tobacco abuse - counseling and nicotine patch  7.   Principal Problem:  *Acute exacerbation of chronic obstructive pulmonary disease (COPD) Active Problems:  Abnormal CT of brain  Depression  Headache  Weight loss, non-intentional  Hypokalemia  Acute respiratory failure  Macrocytosis  Chronic respiratory failure with hypercapnia  Code Status: full Family Communication:  Disposition Plan: home  Jedaiah Rathbun, MD  [facility] Pager [phone]  If 7PM-7AM, please contact night-coverage [email or web address] Password [email or web address] 06/13/2011, 8:58 AM   LOS: 1 day    Antibiotics:  Levaquin 6/7  HPI/Subjective: Much better today  Objective: Filed Vitals:   06/13/11 0618 06/13/11 0730 06/13/11 0814 06/13/11 0856  BP: 95/57 105/70    Pulse: 66 60    Temp: 98.5 F (36.9 C)  98.5 F (36.9 C)   TempSrc: Oral  Axillary   Resp: 24 30    Height:      Weight:      SpO2: 92% 91%  96%   No intake or output data in the 24 hours ending 06/13/11 0858  Exam:   General:  Alert and oriented x3  Cardiovascular: RRR  Respiratory: poor air movement, no wheezes  Abdomen: soft, NT  Neuro - strength 5/5 all 4 extremities,  cerebellar intact  Data Reviewed: Basic Metabolic Panel:  Lab 06/12/11 6295  NA 141  K 2.8*  CL 91*  CO2 42*  GLUCOSE 128*  BUN 8  CREATININE 0.53  CALCIUM  9.3  MG --  PHOS --   Liver Function Tests:  Lab 06/12/11 2341  AST 15  ALT 13  ALKPHOS 66  BILITOT 0.9  PROT 6.5  ALBUMIN 3.4*   No results found for this basename: LIPASE:5,AMYLASE:5 in the last 168 hours No results found for this basename: AMMONIA:5 in the last 168 hours CBC:  Lab 06/12/11 2341  WBC 5.1  NEUTROABS 3.5  HGB 18.9*  HCT 59.4*  MCV 102.6*  PLT 180   Cardiac Enzymes: No results found for this basename: CKTOTAL:5,CKMB:5,CKMBINDEX:5,TROPONINI:5 in the last 168 hours BNP (last 3 results) No results found for this basename: PROBNP:3 in the last 8760 hours CBG: No results found for this basename: GLUCAP:5 in the last 168 hours  No results found for this or any previous visit (from the past 240 hour(s)).   Studies: Dg Chest 2 View  06/13/2011  *RADIOLOGY REPORT*  Clinical Data: Dyspnea, lethargy, weakness.  CHEST - 2 VIEW  Comparison: 05/05/2010  Findings: Hyperinflation with mild interstitial prominence.  Mild left hemidiaphragm elevation and mild bibasilar opacity.  Stable cardiomediastinal contours with mild pulmonary arterial vascular prominence.  No pneumothorax.  No pleural effusion.  No acute osseous finding.  IMPRESSION: Mild hyperinflation.  Mild lung base opacities likely scarring or atelectasis though early infiltrate not entirely excluded.  Original Report Authenticated By: Waneta Martins, M.D.   Ct Head Wo Contrast  06/13/2011  *RADIOLOGY  REPORT*  Clinical Data: Headache  CT HEAD WITHOUT CONTRAST  Technique:  Contiguous axial images were obtained from the base of the skull through the vertex without contrast.  Comparison: None.  Findings: There is increased attenuation within the subarachnoid spaces, fairly diffusely.  The sulci, cisterns, and posterior horn left lateral ventricle appear partially effaced.  There is mild fullness at the foramen magnum and tonsillar herniation is not excluded.  No intraparenchymal hemorrhage or mass identified. The  visualized paranasal sinuses and mastoid air cells are predominately clear.  No displaced calvarial fracture.  IMPRESSION: Sulcal, cisterns, and posterior horn left lateral ventricle effacement suggests diffuse cerebral edema. Per discussion with the ordering clinician, patient has not undergone a recent contrast enhanced examination.  Therefore, the high attenuation within the subarachnoid spaces may reflect subarachnoid hemorrhage or perhaps pseudo subarachnoid hemorrhage. There is mild fullness at the foramen magnum and early tonsillar herniation is not excluded.  Recommend MRI / MRA follow-up.  Critical Value/emergent results were called by telephone at the time of interpretation on 06/13/2011  at 01:00 a.m.  to  Dr. Deretha Emory, who verbally acknowledged these results.  .  Original Report Authenticated By: Waneta Martins, M.D.   Ct Angio Chest W/cm &/or Wo Cm  06/13/2011  *RADIOLOGY REPORT*  Clinical Data: Shortness of breath  CT ANGIOGRAPHY CHEST  Technique:  Multidetector CT imaging of the chest using the standard protocol during bolus administration of intravenous contrast. Multiplanar reconstructed images including MIPs were obtained and reviewed to evaluate the vascular anatomy.  Contrast: OMNIPAQUE IOHEXOL 350 MG/ML SOLN  Comparison: 06/13/2011 radiograph  Findings: No pulmonary arterial branch filling defect.  Prominence of the right pulmonary artery measuring up to 3.3 cm.  Normal caliber aorta.  Normal to mildly enlarged heart size.  Trace pericardial fluid versus thickening.  No pleural effusion.  No intrathoracic lymphadenopathy.  Debris layering dependently within the left main bronchus.  Mild to moderate centrolobular emphysematous changes and biapical scarring. There is a 4 mm nodule within the left lung apex on image 18 of series 5.  Linear opacities within the right upper lobe and associated 5 mm nodular opacity on image 22.  Hyperinflation. Minimal lung base scarring versus  atelectasis.  No pneumothorax.  Limited images through the upper abdomen demonstrate nonspecific gastric wall thickening given incomplete distension.  There is soft tissue encasing the left gastric artery (image 29 of series 8 for example.  No acute osseous finding.  IMPRESSION: No pulmonary embolism or acute intrathoracic process.  Emphysematous changes and biapical scarring.  Biapical nodular opacities also likely reflect sequelae of scarring however as the patient is at high risk for bronchogenic carcinoma, follow-up chest CT at 6-12 months is recommended.  This recommendation follows the consensus statement: Guidelines for Management of Small Pulmonary Nodules Detected on CT Scans: A Statement from the Fleischner Society as published in Radiology 2005; 237:395-400.  Gastric wall thickening is nonspecific given incomplete distension. Consider EGD if clinically warranted.  Soft tissue surrounding the left gastric artery is nonspecific and may represent prominent venous collaterals however I cannot exclude infiltrative tumor on this examination.  Consider a dedicated abdominal CT with intravenous contrast.  Original Report Authenticated By: Waneta Martins, M.D.    Scheduled Meds:   . albuterol  2.5 mg Nebulization Once  . albuterol  2.5 mg Nebulization Q4H  . Fluticasone-Salmeterol  1 puff Inhalation Q12H  . ipratropium  0.5 mg Nebulization Once  . ipratropium  0.5 mg Nebulization Q4H  .  levofloxacin (LEVAQUIN) IV  500 mg Intravenous Once  . levofloxacin  500 mg Oral Daily  . methylPREDNISolone sodium succinate  125 mg Intravenous Once  . methylPREDNISolone (SOLU-MEDROL) injection  80 mg Intravenous Q6H  . pantoprazole  40 mg Oral Q1200  . potassium chloride  10 mEq Intravenous Once  . potassium chloride  10 mEq Intravenous Once  . potassium chloride SA  40 mEq Oral Once  . DISCONTD: levofloxacin (LEVAQUIN) IV  500 mg Intravenous Q24H   Continuous Infusions:   . DISCONTD: sodium chloride  100 mL/hr at 06/13/11 0003  . DISCONTD: 0.9 % NaCl with KCl 20 mEq / L

## 2011-06-13 NOTE — Progress Notes (Signed)
Pt. Refused bipap for tonight. Pt. States she just wants to wear her oxygen. Pt. States she will notify if she changes her mind.

## 2011-06-13 NOTE — ED Notes (Signed)
CRITICAL VALUE ALERT  Critical value received:  PCO2  71.9  Date of notification: 09/13/2011  Time of notification:  0215  Critical value read back yes  Nurse who received alert:  [email or web address]  MD notified: Dr. Deretha Emory  Time: 9811

## 2011-06-13 NOTE — ED Notes (Signed)
CRITICAL VALUE ALERT  Critical value received:  co2  Date of notification:  06/13/11  Time of notification:  0027  Critical value read back:yes  Nurse who received alert:  Thornton Dales, RN  MD notified (1st page):  [email or web address]  Time of first page:  0028  MD notified (2nd page):  Time of second page:  Responding MD:  zackowski  Time MD responded:  [phone]

## 2011-06-13 NOTE — ED Notes (Signed)
Patient transported to CT 

## 2011-06-13 NOTE — H&P (Signed)
Donna Watkins is an 51 y.o. female.    PCP: Milana Obey, MD, MD   Chief Complaint: Shortness of breath  HPI: This is a 51 year old, Caucasian female, with a past medical history of COPD, depression, who was in her usual state of health about 3-4 weeks ago, when she started having difficulty breathing. She tells me that initially, she would does start getting short of breath with some exertion, but then subsequently, even with minimal exertion she would get short of breath. The symptoms worsened over the past few days and then she decided to come in because the nebulizer treatments she was taking at home were not really helping her. She's had a cough, which is dry. Denies any chest pain. No leg swelling. No fever or chills. Denies any nausea, vomiting. No sick contacts. No recent travel. In the emergency department patient was found to be in respiratory distress. She was given nebulizer treatments, and was placed on BiPAP. Her breathing is much improved now.  Patient also tells me, that she's been having headaches on and off for the last couple of weeks. Located all over the head, but predominantly in the front side. Would get as worse as 8/10 in intensity. Denies any visual changes or focal neurological deficits or seizure activity. She has noted some pain in the neck also at times.  She also tells me, that she's lost about 50 pounds over the last one year. She's had some issues with diarrhea and loose stools. She has a history of hemorrhoids and has noted blood in the tissue paper, but not recently. She's never had a colonoscopy.   Home Medications: Prior to Admission medications   Medication Sig Start Date End Date Taking? Authorizing Provider  albuterol (PROVENTIL HFA;VENTOLIN HFA) 108 (90 BASE) MCG/ACT inhaler Inhale 2 puffs into the lungs every 6 (six) hours as needed.   Yes Historical Provider, MD  albuterol (PROVENTIL) (2.5 MG/3ML) 0.083% nebulizer solution Take 2.5 mg by  nebulization every 6 (six) hours as needed.   Yes Historical Provider, MD  ALPRAZolam Prudy Feeler) 1 MG tablet Take 1 mg by mouth 3 (three) times daily as needed.   Yes Historical Provider, MD  citalopram (CELEXA) 20 MG tablet Take 20 mg by mouth daily.   Yes Historical Provider, MD  fluticasone (FLONASE) 50 MCG/ACT nasal spray Place 2 sprays into the nose daily.   Yes Historical Provider, MD  Fluticasone-Salmeterol (ADVAIR) 250-50 MCG/DOSE AEPB Inhale 1 puff into the lungs every 12 (twelve) hours.   Yes Historical Provider, MD  furosemide (LASIX) 40 MG tablet Take 40 mg by mouth daily.   Yes Historical Provider, MD  ipratropium (ATROVENT) 0.02 % nebulizer solution Take 500 mcg by nebulization 4 (four) times daily.   Yes Historical Provider, MD  omeprazole (PRILOSEC) 20 MG capsule Take 20 mg by mouth daily.   Yes Historical Provider, MD    Allergies: No Known Allergies  Past Medical History: Past Medical History  Diagnosis Date  . COPD (chronic obstructive pulmonary disease)   . Anxiety   . Depression   . Hyperlipidemia     Past Surgical History  Procedure Date  . Tubal ligation     Social History:  reports that she has been smoking Cigarettes.  She has been smoking about 1 pack per day. She does not have any smokeless tobacco history on file. She reports that she does not drink alcohol or use illicit drugs.  Family History:  Family History  Problem Relation Age of  Onset  . COPD Mother     Review of Systems - History obtained from the patient General ROS: positive for  - fatigue Psychological ROS: positive for - anxiety Ophthalmic ROS: negative ENT ROS: negative Allergy and Immunology ROS: negative Hematological and Lymphatic ROS: negative Endocrine ROS: negative Respiratory ROS: as in hpi Cardiovascular ROS: negative Gastrointestinal ROS: as in hpi Genito-Urinary ROS: no dysuria, trouble voiding, or hematuria Musculoskeletal ROS: negative Neurological ROS: positive for -  headaches Dermatological ROS: negative  Physical Examination Blood pressure 122/77, pulse 71, temperature 98.7 F (37.1 C), resp. rate 28, height 5\' 3"  (1.6 m), weight 60.328 kg (133 lb), last menstrual period 05/06/2010, SpO2 96.00%.  General appearance: alert, cooperative, appears stated age and no distress Head: Normocephalic, without obvious abnormality, atraumatic Eyes: conjunctivae/corneas clear. PERRL, EOM's intact.  Throat: lips, mucosa, and tongue normal; teeth and gums normal Neck: no adenopathy, no carotid bruit, no JVD, supple, symmetrical, trachea midline and thyroid not enlarged, symmetric, no tenderness/mass/nodules Resp: Decreased air entry at the bases. No wheezing is appreciated. No crackles. Cardio: regular rate and rhythm, S1, S2 normal, no murmur, click, rub or gallop GI: soft, non-tender; bowel sounds normal; no masses,  no organomegaly Extremities: extremities normal, atraumatic, no cyanosis or edema Pulses: 2+ and symmetric Skin: Skin color, texture, turgor normal. No rashes or lesions Lymph nodes: Cervical, supraclavicular, and axillary nodes normal. Neurologic: Alert and oriented X 3, normal strength and tone. Normal symmetric reflexes.   Laboratory Data: Results for orders placed during the hospital encounter of 06/12/11 (from the past 48 hour(s))  CBC     Status: Abnormal   Collection Time   06/12/11 11:41 PM      Component Value Range Comment   WBC 5.1  4.0 - 10.5 (K/uL)    RBC 5.79 (*) 3.87 - 5.11 (MIL/uL)    Hemoglobin 18.9 (*) 12.0 - 15.0 (g/dL)    HCT 62.1 (*) 30.8 - 46.0 (%)    MCV 102.6 (*) 78.0 - 100.0 (fL)    MCH 32.6  26.0 - 34.0 (pg)    MCHC 31.8  30.0 - 36.0 (g/dL)    RDW 65.7  84.6 - 96.2 (%)    Platelets 180  150 - 400 (K/uL)   DIFFERENTIAL     Status: Normal   Collection Time   06/12/11 11:41 PM      Component Value Range Comment   Neutrophils Relative 69  43 - 77 (%)    Neutro Abs 3.5  1.7 - 7.7 (K/uL)    Lymphocytes Relative 22  12 -  46 (%)    Lymphs Abs 1.1  0.7 - 4.0 (K/uL)    Monocytes Relative 8  3 - 12 (%)    Monocytes Absolute 0.4  0.1 - 1.0 (K/uL)    Eosinophils Relative 1  0 - 5 (%)    Eosinophils Absolute 0.0  0.0 - 0.7 (K/uL)    Basophils Relative 0  0 - 1 (%)    Basophils Absolute 0.0  0.0 - 0.1 (K/uL)   BASIC METABOLIC PANEL     Status: Abnormal   Collection Time   06/12/11 11:41 PM      Component Value Range Comment   Sodium 141  135 - 145 (mEq/L)    Potassium 2.8 (*) 3.5 - 5.1 (mEq/L)    Chloride 91 (*) 96 - 112 (mEq/L)    CO2 42 (*) 19 - 32 (mEq/L)    Glucose, Bld 128 (*) 70 - 99 (mg/dL)  BUN 8  6 - 23 (mg/dL)    Creatinine, Ser 4.01  0.50 - 1.10 (mg/dL)    Calcium 9.3  8.4 - 10.5 (mg/dL)    GFR calc non Af Amer >90  >90 (mL/min)    GFR calc Af Amer >90  >90 (mL/min)   HEPATIC FUNCTION PANEL     Status: Abnormal   Collection Time   06/12/11 11:41 PM      Component Value Range Comment   Total Protein 6.5  6.0 - 8.3 (g/dL)    Albumin 3.4 (*) 3.5 - 5.2 (g/dL)    AST 15  0 - 37 (U/L)    ALT 13  0 - 35 (U/L)    Alkaline Phosphatase 66  39 - 117 (U/L)    Total Bilirubin 0.9  0.3 - 1.2 (mg/dL)    Bilirubin, Direct 0.2  0.0 - 0.3 (mg/dL)    Indirect Bilirubin 0.7  0.3 - 0.9 (mg/dL)   BLOOD GAS, ARTERIAL     Status: Abnormal   Collection Time   06/12/11 11:59 PM      Component Value Range Comment   O2 Content 6.0      Delivery systems NASAL CANNULA      pH, Arterial 7.371  7.350 - 7.400     pCO2 arterial 78.8 (*) 35.0 - 45.0 (mmHg)    pO2, Arterial 63.5 (*) 80.0 - 100.0 (mmHg)    Bicarbonate 44.5 (*) 20.0 - 24.0 (mEq/L)    TCO2 37.0  0 - 100 (mmol/L)    Acid-Base Excess 18.2 (*) 0.0 - 2.0 (mmol/L)    O2 Saturation 89.7      Patient temperature 37.0      Collection site RIGHT RADIAL      Drawn by COLLECTED BY RT      Sample type ARTERIAL      Allens test (pass/fail) PASS  PASS    URINALYSIS, ROUTINE W REFLEX MICROSCOPIC     Status: Abnormal   Collection Time   06/13/11  1:48 AM      Component  Value Range Comment   Color, Urine YELLOW  YELLOW     APPearance CLEAR  CLEAR     Specific Gravity, Urine >1.030 (*) 1.005 - 1.030     pH 6.0  5.0 - 8.0     Glucose, UA NEGATIVE  NEGATIVE (mg/dL)    Hgb urine dipstick TRACE (*) NEGATIVE     Bilirubin Urine MODERATE (*) NEGATIVE     Ketones, ur NEGATIVE  NEGATIVE (mg/dL)    Protein, ur 30 (*) NEGATIVE (mg/dL)    Urobilinogen, UA 1.0  0.0 - 1.0 (mg/dL)    Nitrite NEGATIVE  NEGATIVE     Leukocytes, UA NEGATIVE  NEGATIVE    URINE MICROSCOPIC-ADD ON     Status: Abnormal   Collection Time   06/13/11  1:48 AM      Component Value Range Comment   Squamous Epithelial / LPF RARE  RARE     WBC, UA 7-10  <3 (WBC/hpf)    RBC / HPF 3-6  <3 (RBC/hpf)    Bacteria, UA MANY (*) RARE    BLOOD GAS, ARTERIAL     Status: Abnormal   Collection Time   06/13/11  2:14 AM      Component Value Range Comment   FIO2 0.60      Delivery systems BILEVEL POSITIVE AIRWAY PRESSURE      Inspiratory PAP 8      Expiratory PAP 4  pH, Arterial 7.377  7.350 - 7.400     pCO2 arterial 71.9 (*) 35.0 - 45.0 (mmHg)    pO2, Arterial 178.0 (*) 80.0 - 100.0 (mmHg)    Bicarbonate 41.3 (*) 20.0 - 24.0 (mEq/L)    TCO2 34.4  0 - 100 (mmol/L)    Acid-Base Excess 15.4 (*) 0.0 - 2.0 (mmol/L)    O2 Saturation 98.9      Patient temperature 37.0      Collection site RIGHT RADIAL      Drawn by COLLECTED BY RT      Sample type ARTERIAL      Allens test (pass/fail) PASS  PASS      Radiology Reports: Dg Chest 2 View  06/13/2011  *RADIOLOGY REPORT*  Clinical Data: Dyspnea, lethargy, weakness.  CHEST - 2 VIEW  Comparison: 05/05/2010  Findings: Hyperinflation with mild interstitial prominence.  Mild left hemidiaphragm elevation and mild bibasilar opacity.  Stable cardiomediastinal contours with mild pulmonary arterial vascular prominence.  No pneumothorax.  No pleural effusion.  No acute osseous finding.  IMPRESSION: Mild hyperinflation.  Mild lung base opacities likely scarring or  atelectasis though early infiltrate not entirely excluded.  Original Report Authenticated By: Waneta Martins, M.D.   Ct Head Wo Contrast  06/13/2011  *RADIOLOGY REPORT*  Clinical Data: Headache  CT HEAD WITHOUT CONTRAST  Technique:  Contiguous axial images were obtained from the base of the skull through the vertex without contrast.  Comparison: None.  Findings: There is increased attenuation within the subarachnoid spaces, fairly diffusely.  The sulci, cisterns, and posterior horn left lateral ventricle appear partially effaced.  There is mild fullness at the foramen magnum and tonsillar herniation is not excluded.  No intraparenchymal hemorrhage or mass identified. The visualized paranasal sinuses and mastoid air cells are predominately clear.  No displaced calvarial fracture.  IMPRESSION: Sulcal, cisterns, and posterior horn left lateral ventricle effacement suggests diffuse cerebral edema. Per discussion with the ordering clinician, patient has not undergone a recent contrast enhanced examination.  Therefore, the high attenuation within the subarachnoid spaces may reflect subarachnoid hemorrhage or perhaps pseudo subarachnoid hemorrhage. There is mild fullness at the foramen magnum and early tonsillar herniation is not excluded.  Recommend MRI / MRA follow-up.  Critical Value/emergent results were called by telephone at the time of interpretation on 06/13/2011  at 01:00 a.m.  to  Dr. Deretha Emory, who verbally acknowledged these results.  .  Original Report Authenticated By: Waneta Martins, M.D.   Ct Angio Chest W/cm &/or Wo Cm  06/13/2011  *RADIOLOGY REPORT*  Clinical Data: Shortness of breath  CT ANGIOGRAPHY CHEST  Technique:  Multidetector CT imaging of the chest using the standard protocol during bolus administration of intravenous contrast. Multiplanar reconstructed images including MIPs were obtained and reviewed to evaluate the vascular anatomy.  Contrast: OMNIPAQUE IOHEXOL 350 MG/ML SOLN   Comparison: 06/13/2011 radiograph  Findings: No pulmonary arterial branch filling defect.  Prominence of the right pulmonary artery measuring up to 3.3 cm.  Normal caliber aorta.  Normal to mildly enlarged heart size.  Trace pericardial fluid versus thickening.  No pleural effusion.  No intrathoracic lymphadenopathy.  Debris layering dependently within the left main bronchus.  Mild to moderate centrolobular emphysematous changes and biapical scarring. There is a 4 mm nodule within the left lung apex on image 18 of series 5.  Linear opacities within the right upper lobe and associated 5 mm nodular opacity on image 22.  Hyperinflation. Minimal lung base scarring versus atelectasis.  No pneumothorax.  Limited images through the upper abdomen demonstrate nonspecific gastric wall thickening given incomplete distension.  There is soft tissue encasing the left gastric artery (image 29 of series 8 for example.  No acute osseous finding.  IMPRESSION: No pulmonary embolism or acute intrathoracic process.  Emphysematous changes and biapical scarring.  Biapical nodular opacities also likely reflect sequelae of scarring however as the patient is at high risk for bronchogenic carcinoma, follow-up chest CT at 6-12 months is recommended.  This recommendation follows the consensus statement: Guidelines for Management of Small Pulmonary Nodules Detected on CT Scans: A Statement from the Fleischner Society as published in Radiology 2005; 237:395-400.  Gastric wall thickening is nonspecific given incomplete distension. Consider EGD if clinically warranted.  Soft tissue surrounding the left gastric artery is nonspecific and may represent prominent venous collaterals however I cannot exclude infiltrative tumor on this examination.  Consider a dedicated abdominal CT with intravenous contrast.  Original Report Authenticated By: Waneta Martins, M.D.     Assessment/Plan  Principal Problem:  *Acute exacerbation of chronic  obstructive pulmonary disease (COPD) Active Problems:  Abnormal CT of brain  Depression  Headache  Weight loss, non-intentional  Hypokalemia  Acute respiratory failure  Macrocytosis   #1 acute exacerbation of the COPD with acute respiratory failure: She'll be given steroids, antibiotics, nebulizer treatments. BiPAP will be continued. ABG will be repeated later in the day today. Am hoping that the BiPAP can be discontinued later today if the patient is feeling better. Smoking cessation emphasized to the patient.  #2 headache with an abnormal CT head: This CT finding has been discussed with the neurosurgeon, Dr. Venetia Maxon. According to Dr. Deretha Emory who discussed with the neurosurgeon, they were not very impressed with a CT scan. Especially considering her clinical presentation. However, MRI was recommended. I also spoke to Dr. Roseanne Reno with neurology, and they also recommend MRI and MRA with contrast at this time. Patient is without any significant headache at this time. She does not have any neurological deficits. She's completely stable from a neurological standpoint. Unfortunately MRI is not available at this facility through the weekend and so, the patient will need to be transferred to [facility].  #3 hypokalemia: This will be repleted intravenously. Magnesium level will be checked.  #4 abnormal abdominal findings on chest CT: Soft tissue encasing of the left gastric artery was noted. In light of her weight loss and diarrhea and loss of appetite she will require abdominal CT with contrast. This will be done on a nonurgent basis.  #5 history of weight loss. And there is always concern about malignancy in such situations. She gives me a history of diarrhea with some blood in the stools occasionally. However, she is not anemic. As mentioned above we will first proceed with an abdominal CT and if that is unremarkable further management can be pursued as an outpatient.  #6 pulmonary nodules:  Considering that she is a smoker there is always concern about cancer so, a repeat CT of the chest should be considered in 3-6 months time. Would also recommend followup with the pulmonologist. However, this can be pursued as an outpatient.  She's a full code. Sequential compressive devices will be utilized for DVT, prophylaxis.  Patient is agreeable with the plan to transfer to [facility].  Further management decisions will depend on results of further testing and patient's response to treatment.  [facility]  [facility] Pager [phone]  06/13/2011, 5:03 AM

## 2011-06-14 ENCOUNTER — Inpatient Hospital Stay (HOSPITAL_COMMUNITY): Payer: Medicare Other

## 2011-06-14 DIAGNOSIS — R001 Bradycardia, unspecified: Secondary | ICD-10-CM | POA: Diagnosis present

## 2011-06-14 DIAGNOSIS — R634 Abnormal weight loss: Secondary | ICD-10-CM

## 2011-06-14 DIAGNOSIS — E876 Hypokalemia: Secondary | ICD-10-CM

## 2011-06-14 DIAGNOSIS — G9389 Other specified disorders of brain: Secondary | ICD-10-CM

## 2011-06-14 DIAGNOSIS — J441 Chronic obstructive pulmonary disease with (acute) exacerbation: Secondary | ICD-10-CM

## 2011-06-14 DIAGNOSIS — I959 Hypotension, unspecified: Secondary | ICD-10-CM | POA: Diagnosis present

## 2011-06-14 DIAGNOSIS — E860 Dehydration: Secondary | ICD-10-CM | POA: Diagnosis present

## 2011-06-14 LAB — RETICULOCYTES
RBC.: 5.11 MIL/uL (ref 3.87–5.11)
Retic Count, Absolute: 122.6 10*3/uL (ref 19.0–186.0)
Retic Ct Pct: 2.4 % (ref 0.4–3.1)

## 2011-06-14 LAB — BASIC METABOLIC PANEL
CO2: 37 mEq/L — ABNORMAL HIGH (ref 19–32)
Calcium: 8.4 mg/dL (ref 8.4–10.5)
Chloride: 98 mEq/L (ref 96–112)
GFR calc non Af Amer: >90 mL/min (ref >90)
Glucose, Bld: 143 mg/dL — ABNORMAL HIGH (ref 70–99)
Potassium: 3.5 mEq/L (ref 3.5–5.1)
Sodium: 140 mEq/L (ref 135–145)

## 2011-06-14 LAB — CBC
HCT: 53.3 % — ABNORMAL HIGH (ref 36.0–46.0)
Hemoglobin: 16.9 g/dL — ABNORMAL HIGH (ref 12.0–15.0)
MCH: 32.4 pg (ref 26.0–34.0)
MCHC: 31.7 g/dL (ref 30.0–36.0)
MCV: 102.1 fL — ABNORMAL HIGH (ref 78.0–100.0)
Platelets: 120 10*3/uL — ABNORMAL LOW (ref 150–400)
RBC: 5.22 MIL/uL — ABNORMAL HIGH (ref 3.87–5.11)
WBC: 7.3 10*3/uL (ref 4.0–10.5)

## 2011-06-14 LAB — IRON AND TIBC
Iron: 16 ug/dL — ABNORMAL LOW (ref 42–135)
Saturation Ratios: 7 % — ABNORMAL LOW (ref 20–55)
TIBC: 235 ug/dL — ABNORMAL LOW (ref 250–470)

## 2011-06-14 LAB — FOLATE: Folate: 4.6 ng/mL

## 2011-06-14 LAB — FERRITIN: Ferritin: 16 ng/mL (ref 10–291)

## 2011-06-14 MED ORDER — IOHEXOL 300 MG/ML  SOLN
100.0000 mL | Freq: Once | INTRAMUSCULAR | Status: AC | PRN
  Administered 2011-06-14: 100 mL via INTRAVENOUS

## 2011-06-14 MED ORDER — IOHEXOL 300 MG/ML  SOLN
20.0000 mL | INTRAMUSCULAR | Status: AC
  Administered 2011-06-14: 20 mL via ORAL

## 2011-06-14 MED ORDER — SODIUM CHLORIDE 0.9 % IV SOLN
INTRAVENOUS | Status: DC
  Administered 2011-06-14: 125 mL/h via INTRAVENOUS
  Administered 2011-06-14 – 2011-06-15 (×2): via INTRAVENOUS

## 2011-06-14 MED ORDER — METHYLPREDNISOLONE SODIUM SUCC 125 MG IJ SOLR
80.0000 mg | Freq: Two times a day (BID) | INTRAMUSCULAR | Status: DC
  Administered 2011-06-14 – 2011-06-16 (×4): 80 mg via INTRAVENOUS
  Filled 2011-06-14 (×4): qty 1.28

## 2011-06-14 NOTE — Plan of Care (Signed)
Problem: Phase I Progression Outcomes Goal: O2 sats > or equal 90% or at baseline Outcome: Progressing Pt on 5L via [place]; pt doesn't feel she needs to wear BiPAP; O2 sats being maintained >90%

## 2011-06-14 NOTE — Progress Notes (Signed)
Pt. Refused bipap for tonight. Pt. States she just wants to wear her oxygen. 

## 2011-06-14 NOTE — Progress Notes (Signed)
[facility]  Interim history: This is a 51 year old, Caucasian female, with a past medical history of COPD, depression, who was in her usual state of health about 3-4 weeks ago, when she started having difficulty breathing.The symptoms worsened over the past few days and then she decided to come in because the nebulizer treatments she was taking at home were not really helping her. She's had a cough, which is dry.  Antibiotics: Levaquin  Subjective: Mild dry cough still. Breathing better. Has lost 50 lb in 1 yr. Appetite poor. Has constipation alternating with diarrhea without use of laxative. Wants to have a colonoscopy as outpt and not here.   Objective: Blood pressure 113/70, pulse 53, temperature 98.1 F (36.7 C), temperature source Oral, resp. rate 24, height 5\' 3"  (1.6 m), weight 60.328 kg (133 lb), last menstrual period 05/06/2010, SpO2 93.00%. Weight change:   Intake/Output Summary (Last 24 hours) at 06/14/11 1707 Last data filed at 06/14/11 0800  Gross per 24 hour  Intake   1040 ml  Output    300 ml  Net    740 ml    Physical Exam: General appearance: alert, cooperative and no distress Throat: lips, mucosa, and tongue normal; teeth and gums normal Lungs: extermely poor air entry- no wheeze, ronchi or crackles Heart: regular rate and rhythm, S1, S2 normal, no murmur, click, rub or gallop Abdomen: soft, non-tender; bowel sounds normal; no masses,  no organomegaly Extremities: extremities normal, atraumatic, no cyanosis or edema Skin: Skin color, texture, turgor normal. No rashes or lesions  Lab Results:  Basename 06/14/11 0550 06/13/11 0932 06/12/11 2341  NA 140 -- 141  K 3.5 -- 2.8*  CL 98 -- 91*  CO2 37* -- 42*  GLUCOSE 143* -- 128*  BUN 10 -- 8  CREATININE 0.44* -- 0.53  CALCIUM 8.4 -- 9.3  MG -- 1.6 --  PHOS -- -- --    Basename 06/12/11 2341  AST 15  ALT 13  ALKPHOS 66  BILITOT 0.9  PROT 6.5  ALBUMIN 3.4*   No results found for this basename:  LIPASE:2,AMYLASE:2 in the last 72 hours  Basename 06/14/11 0550 06/12/11 2341  WBC 7.3 5.1  NEUTROABS -- 3.5  HGB 16.9* 18.9*  HCT 53.3* 59.4*  MCV 102.1* 102.6*  PLT 120* 180   No results found for this basename: CKTOTAL:3,CKMB:3,CKMBINDEX:3,TROPONINI:3 in the last 72 hours No components found with this basename: POCBNP:3 No results found for this basename: DDIMER:2 in the last 72 hours No results found for this basename: HGBA1C:2 in the last 72 hours No results found for this basename: CHOL:2,HDL:2,LDLCALC:2,TRIG:2,CHOLHDL:2,LDLDIRECT:2 in the last 72 hours  Basename 06/13/11 0932  TSH 0.428  T4TOTAL --  T3FREE --  THYROIDAB --    Basename 06/14/11 1227  VITAMINB12 --  FOLATE --  FERRITIN --  TIBC --  IRON --  RETICCTPCT 2.4    Micro Results: Recent Results (from the past 240 hour(s))  MRSA PCR SCREENING     Status: Normal   Collection Time   06/13/11  8:04 AM      Component Value Range Status Comment   MRSA by PCR NEGATIVE  NEGATIVE  Final     Studies/Results: Dg Chest 2 View  06/13/2011  *RADIOLOGY REPORT*  Clinical Data: Dyspnea, lethargy, weakness.  CHEST - 2 VIEW  Comparison: 05/05/2010  Findings: Hyperinflation with mild interstitial prominence.  Mild left hemidiaphragm elevation and mild bibasilar opacity.  Stable cardiomediastinal contours with mild pulmonary arterial vascular prominence.  No pneumothorax.  No pleural effusion.  No acute osseous finding.  IMPRESSION: Mild hyperinflation.  Mild lung base opacities likely scarring or atelectasis though early infiltrate not entirely excluded.  Original Report Authenticated By: Waneta Martins, M.D.   Ct Head Wo Contrast  06/13/2011  *RADIOLOGY REPORT*  Clinical Data: Headache  CT HEAD WITHOUT CONTRAST  Technique:  Contiguous axial images were obtained from the base of the skull through the vertex without contrast.  Comparison: None.  Findings: There is increased attenuation within the subarachnoid spaces, fairly  diffusely.  The sulci, cisterns, and posterior horn left lateral ventricle appear partially effaced.  There is mild fullness at the foramen magnum and tonsillar herniation is not excluded.  No intraparenchymal hemorrhage or mass identified. The visualized paranasal sinuses and mastoid air cells are predominately clear.  No displaced calvarial fracture.  IMPRESSION: Sulcal, cisterns, and posterior horn left lateral ventricle effacement suggests diffuse cerebral edema. Per discussion with the ordering clinician, patient has not undergone a recent contrast enhanced examination.  Therefore, the high attenuation within the subarachnoid spaces may reflect subarachnoid hemorrhage or perhaps pseudo subarachnoid hemorrhage. There is mild fullness at the foramen magnum and early tonsillar herniation is not excluded.  Recommend MRI / MRA follow-up.  Critical Value/emergent results were called by telephone at the time of interpretation on 06/13/2011  at 01:00 a.m.  to  Dr. Deretha Emory, who verbally acknowledged these results.  .  Original Report Authenticated By: Waneta Martins, M.D.   Ct Angio Chest W/cm &/or Wo Cm  06/13/2011  *RADIOLOGY REPORT*  Clinical Data: Shortness of breath  CT ANGIOGRAPHY CHEST  Technique:  Multidetector CT imaging of the chest using the standard protocol during bolus administration of intravenous contrast. Multiplanar reconstructed images including MIPs were obtained and reviewed to evaluate the vascular anatomy.  Contrast: OMNIPAQUE IOHEXOL 350 MG/ML SOLN  Comparison: 06/13/2011 radiograph  Findings: No pulmonary arterial branch filling defect.  Prominence of the right pulmonary artery measuring up to 3.3 cm.  Normal caliber aorta.  Normal to mildly enlarged heart size.  Trace pericardial fluid versus thickening.  No pleural effusion.  No intrathoracic lymphadenopathy.  Debris layering dependently within the left main bronchus.  Mild to moderate centrolobular emphysematous changes and  biapical scarring. There is a 4 mm nodule within the left lung apex on image 18 of series 5.  Linear opacities within the right upper lobe and associated 5 mm nodular opacity on image 22.  Hyperinflation. Minimal lung base scarring versus atelectasis.  No pneumothorax.  Limited images through the upper abdomen demonstrate nonspecific gastric wall thickening given incomplete distension.  There is soft tissue encasing the left gastric artery (image 29 of series 8 for example.  No acute osseous finding.  IMPRESSION: No pulmonary embolism or acute intrathoracic process.  Emphysematous changes and biapical scarring.  Biapical nodular opacities also likely reflect sequelae of scarring however as the patient is at high risk for bronchogenic carcinoma, follow-up chest CT at 6-12 months is recommended.  This recommendation follows the consensus statement: Guidelines for Management of Small Pulmonary Nodules Detected on CT Scans: A Statement from the Fleischner Society as published in Radiology 2005; 237:395-400.  Gastric wall thickening is nonspecific given incomplete distension. Consider EGD if clinically warranted.  Soft tissue surrounding the left gastric artery is nonspecific and may represent prominent venous collaterals however I cannot exclude infiltrative tumor on this examination.  Consider a dedicated abdominal CT with intravenous contrast.  Original Report Authenticated By: Waneta Martins, M.D.  Mr Laqueta Jean Wo Contrast  06/13/2011  *RADIOLOGY REPORT*  Clinical Data:  Headache, poor dentition.  MRI HEAD WITHOUT AND WITH CONTRAST MRA HEAD WITHOUT CONTRAST  Technique:  Multiplanar, multiecho pulse sequences of the brain and surrounding structures were obtained without and with intravenous contrast.  Angiographic images of the head were obtained using MRA technique without contrast.  Contrast: 13mL MULTIHANCE GADOBENATE DIMEGLUMINE 529 MG/ML IV SOLN  Comparison:  CT head earlier in the day.  MRI HEAD WITHOUT AND  WITH CONTRAST  Findings:  There is no acute stroke or intracranial hemorrhage.  9 mm of tonsillar herniation with slight impaction of the cerebellar tonsils suggest mild Chiari I malformation.  No visible subarachnoid blood or cerebritis by MR.  No abnormal post contrast enhancement.  No venous sinus thrombosis.  Carotid and basilar arteries widely patent.  Compared with prior CT, a similar appearance is noted.   There is slight effacement of the cortical sulci on the left (image 49 series 12) as compared to the right which may be related to Chiari deformity. There is slight ventriculomegaly without dilated temporal horns.  Normal pituitary.  Extracranial soft tissues unremarkable. No visible cervical hydromyelia or syrinx.  IMPRESSION: Mild Chiari I malformation. 9 mm of measurable tonsillar descent below the foramen magnum with slight abnormal shape.  Mild ventriculomegaly.  No acute stroke, cerebritis, or abnormal intracranial enhancement.  MRA HEAD  Findings: Carotid arteries are widely patent.  Basilar artery is widely patent with vertebrals codominant.  No intracranial stenosis or aneurysm.  IMPRESSION: Negative.  Original Report Authenticated By: Elsie Stain, M.D.   Ct Abdomen Pelvis W Contrast  06/14/2011  *RADIOLOGY REPORT*  Clinical Data: A loss.  Abnormality on chest CT in the region of the left gastric artery.  CT ABDOMEN AND PELVIS WITH CONTRAST  Technique:  Multidetector CT imaging of the abdomen and pelvis was performed following the standard protocol during bolus administration of intravenous contrast.  Contrast: OMNIPAQUE IOHEXOL 300 MG/ML  SOLN, 1 OMNIPAQUE IOHEXOL 300 MG/ML  SOLN  Comparison: CT angiography chest 06/13/2011  Findings: The imaged lung bases are clear, aside from focal atelectasis or scarring in the lingula.  Please also see report for recent CT of the chest dated 06/13/2011.  There is small hiatal hernia.  The stomach is moderately distended with oral contrast on today's  examination.  The apparent gastric wall thickening at the fundus on yesterday's chest CT appears much less prominent on today's examination with better distention.  There is however some reason there is however some haziness and stranding in the upper central abdominal mesenteries along the course of the celiac axis and left gastric artery.  It appears less mass-like on the abdominal CT compared to yesterday's chest CT.  There is suggestion of mild periportal edema in the liver. Adjacent to the falciform ligament of the liver is a nonspecific 16 mm low density lesion.  This could reflect a benign oblong cyst or focal fatty infiltration.  There is a small amount of fluid surrounding the gallbladder.  The gallbladder mucosa is enhancing.  No definite gallbladder wall thickening.  No calcified gallstones are seen.  The common bile duct is normal in caliber.  The spleen is normal in size and enhancement.  The pancreas shows no evidence of mass, calcification or ductal dilatation.  Small cyst lower pole right kidney.  Otherwise, the kidneys are within normal limits.  The duodenum, jejunum, and proximal ileal loops appear normal. There is some fecalization of  the distal/terminal ileum.  Colon and rectum demonstrate normal wall thickness. The appendix is normal.  No lymphadenopathy is identified in the mesenteries or retroperitoneum of the abdomen or pelvis.  The abdominal aorta is normal in caliber.  Uterus, urinary bladder, and adnexa are within normal limits. There is a small amount of free fluid in the right pelvis.  No acute or suspicious bony abnormality.  IMPRESSION:  1.  Nonspecific findings suggestive of some inflammatory changes in the upper abdomen.  Specifically, there is some mesenteric edema adjacent to the celiac axis the left gastric artery, mild periportal edema within the liver, and small amount of pericholecystic fluid.  Suggest correlation with laboratory results to exclude hepatitis or pancreatitis.   If the patient has clinical signs or symptoms of gastritis, upper endoscopy could be considered.  It is noted that the stomach is better distended on today's examination and does not show any definite persistent gastric wall thickening. 2.  Given the presence of the pericholecystic edema, if the patient has pain in the right upper quadrant, right upper quadrant ultrasound could be performed to exclude gallbladder wall thickening or noncalcified stones.  Gallbladder wall appears within normal thickness by CT. 3.  Low density lesion adjacent to the falciform ligament the liver.  This is favored to be a cyst or focal fatty infiltration. 4.  Fecalization of the distal/terminal ileum.  This can be seen in patients with chronic constipation.  Original Report Authenticated By: Britta Mccreedy, M.D.    Medications: Scheduled Meds:   . albuterol  2.5 mg Nebulization Q4H  . antiseptic oral rinse  15 mL Mouth Rinse BID  . Fluticasone-Salmeterol  1 puff Inhalation Q12H  . iohexol  20 mL Oral Q1 Hr x 2  . ipratropium  0.5 mg Nebulization Q4H  . levofloxacin  500 mg Oral Q24H  . methylPREDNISolone (SOLU-MEDROL) injection  80 mg Intravenous Q12H  . nicotine  21 mg Transdermal Daily  . pantoprazole  40 mg Oral Q1200  . sodium chloride  500 mL Intravenous Once  . DISCONTD: methylPREDNISolone (SOLU-MEDROL) injection  80 mg Intravenous Q6H   Continuous Infusions:   . sodium chloride 125 mL/hr (06/14/11 1238)   PRN Meds:.acetaminophen, acetaminophen, albuterol, ALPRAZolam, iohexol, ondansetron (ZOFRAN) IV, ondansetron, DISCONTD: oxyCODONE  Assessment/Plan: Principal Problem:  COPD exacerbation/  Acute respiratory failure/ Chronic respiratory failure with hypercapnia Cont steroids but taper slightly, cont nebs and levaquin- still on 5 L O2 - cont to watch in SDU.  Active Problems:  Arnold-Chiari malformation- mild per MRI- neuro, Dr Marvel Plan does not recommend further w/u.    Depression Her PCP suspects  this is the cause of her weight loss.    Weight loss, non-intentional May be cancer related as well- may need biopsy lung nodules Needs EGD and colonoscopy with above mentioned CT results   Nodules on CT High risk for cancer- will likely need biopsy when resp status improves-    Hypokalemia Replaced    Macrocytosis F/u anemia profile   Dehydration, severe/ Hypotension Will be resuming hydration today    Sinus bradycardia BP and mental status holding     Code Status: Family Communication: DispositionCalvert Cantor [phone] 06/14/2011, 5:07 PM  LOS: 2 days

## 2011-06-14 NOTE — Progress Notes (Signed)
Pt removed Nicotine patch; found on the bedside table; pt states that she feels that the Nicotine patch is what may be keeping her awake and states she is not having any cravings; will continue to monitor

## 2011-06-15 DIAGNOSIS — G9389 Other specified disorders of brain: Secondary | ICD-10-CM

## 2011-06-15 DIAGNOSIS — R911 Solitary pulmonary nodule: Secondary | ICD-10-CM | POA: Diagnosis present

## 2011-06-15 DIAGNOSIS — E876 Hypokalemia: Secondary | ICD-10-CM

## 2011-06-15 DIAGNOSIS — D509 Iron deficiency anemia, unspecified: Secondary | ICD-10-CM | POA: Diagnosis present

## 2011-06-15 DIAGNOSIS — R634 Abnormal weight loss: Secondary | ICD-10-CM

## 2011-06-15 DIAGNOSIS — D751 Secondary polycythemia: Secondary | ICD-10-CM | POA: Diagnosis present

## 2011-06-15 DIAGNOSIS — J441 Chronic obstructive pulmonary disease with (acute) exacerbation: Secondary | ICD-10-CM

## 2011-06-15 LAB — CBC
HCT: 55.2 % — ABNORMAL HIGH (ref 36.0–46.0)
Hemoglobin: 17.6 g/dL — ABNORMAL HIGH (ref 12.0–15.0)
MCH: 32.6 pg (ref 26.0–34.0)
MCV: 102.2 fL — ABNORMAL HIGH (ref 78.0–100.0)
Platelets: 125 10*3/uL — ABNORMAL LOW (ref 150–400)
RBC: 5.4 MIL/uL — ABNORMAL HIGH (ref 3.87–5.11)

## 2011-06-15 LAB — BASIC METABOLIC PANEL
CO2: 38 mEq/L — ABNORMAL HIGH (ref 19–32)
Calcium: 8.6 mg/dL (ref 8.4–10.5)
Chloride: 99 mEq/L (ref 96–112)
GFR calc Af Amer: >90 mL/min (ref >90)
Glucose, Bld: 115 mg/dL — ABNORMAL HIGH (ref 70–99)
Potassium: 3.8 mEq/L (ref 3.5–5.1)
Sodium: 142 mEq/L (ref 135–145)

## 2011-06-15 MED ORDER — ENSURE COMPLETE PO LIQD
237.0000 mL | ORAL | Status: DC
  Administered 2011-06-15 – 2011-06-16 (×2): 237 mL via ORAL

## 2011-06-15 MED ORDER — IPRATROPIUM BROMIDE 0.02 % IN SOLN
0.5000 mg | Freq: Four times a day (QID) | RESPIRATORY_TRACT | Status: DC
  Administered 2011-06-15 – 2011-06-16 (×4): 0.5 mg via RESPIRATORY_TRACT
  Filled 2011-06-15 (×5): qty 2.5

## 2011-06-15 MED ORDER — ALBUTEROL SULFATE (5 MG/ML) 0.5% IN NEBU
2.5000 mg | INHALATION_SOLUTION | Freq: Four times a day (QID) | RESPIRATORY_TRACT | Status: DC
  Administered 2011-06-15 – 2011-06-16 (×4): 2.5 mg via RESPIRATORY_TRACT
  Filled 2011-06-15 (×5): qty 0.5

## 2011-06-15 NOTE — Care Management Note (Addendum)
    Page 1 of 1   06/16/2011     3:09:50 PM   CARE MANAGEMENT NOTE 06/16/2011  Patient:  Donna Watkins, Donna Watkins   Account Number:  [id number]  Date Initiated:  06/15/2011  Documentation initiated by:  Junius Creamer  Subjective/Objective Assessment:   adm w resp failure     Action/Plan:   lives alone, pcp dr Sudie Bailey   Anticipated DC Date:     Anticipated DC Plan:        DC Planning Services  CM consult      Choice offered to / List presented to:  C-1 Patient   DME arranged  [facility]      DME agency  [facility]        Status of service:  Completed, signed off Medicare Important Message given?   (If response is "NO", the following Medicare IM given date fields will be blank) Date Medicare IM given:   Date Additional Medicare IM given:    Discharge Disposition:  HOME/SELF CARE  Per UR Regulation:  Reviewed for med. necessity/level of care/duration of stay  If discussed at Long Length of Stay Meetings, dates discussed:    Comments:  06/16/11 Onnie Boer, RN, BSN 1507 PT IS TO DC TO HOME WITH SELF CARE.  PT NEEDED HOME O2 AND HAS CHOSEN THE ABOVE AGENCY.  O2 IS TO BE DELIVERED TO THE PT PRIOR TO DC.  6/10 12:30p debbie dowell rn,bsn [phone]

## 2011-06-15 NOTE — Progress Notes (Addendum)
[facility] [place] TEAM 1 - Stepdown/ICU TEAM  Subjective: Alert and reports that previous dyspnea has resolved. Clarify that although recently was smoking only one pack of cigarettes per day has been much heavier smoker in the past. Also related that first and second degree family members died from complications related to COPD although no family history of liver disorders or other non-smoking-related lung disorders. The patient clarified to [place] at least an 80 pound nonvolitional weight loss over the past year. She adds that she has had anorexia and has had recurrent issues with irritable bowel syndrome alternating between constipated and looser diarrheal stools for at least one month.  Objective: Blood pressure 112/65, pulse 59, temperature 98.5 F (36.9 C), temperature source Oral, resp. rate 17, height 5\' 3"  (1.6 m), weight 60.328 kg (133 lb), last menstrual period 05/06/2010, SpO2 90.00%.  Intake/Output from previous day: 06/09 0701 - 06/10 0700 In: 2465 [P.O.:840; I.V.:1625] Out: 975 [Urine:975] Intake/Output this shift: Total I/O In: 400 [P.O.:150; I.V.:250] Out: 100 [Urine:100]  General appearance: alert, cooperative, appears older than stated age and no distress Resp: clear to auscultation bilaterally without wheezes but poor air movement noted th/o all areas  Cardio: regular rate and sinus rhythm, S1, S2 normal, no murmur, click, rub or gallop GI: soft, non-tender; bowel sounds normal; no masses,  no organomegaly Extremities: extremities normal, atraumatic, no cyanosis or edema Neurologic: Grossly normal  Lab Results:  Basename 06/15/11 0535 06/14/11 0550  WBC 7.8 7.3  HGB 17.6* 16.9*  HCT 55.2* 53.3*  PLT 125* 120*   BMET  Basename 06/15/11 0535 06/14/11 0550  NA 142 140  K 3.8 3.5  CL 99 98  CO2 38* 37*  GLUCOSE 115* 143*  BUN 8 10  CREATININE 0.48* 0.44*  CALCIUM 8.6 8.4    Studies/Results: Ct Abdomen Pelvis W Contrast  06/14/2011  *RADIOLOGY  REPORT*  Clinical Data: A loss.  Abnormality on chest CT in the region of the left gastric artery.  CT ABDOMEN AND PELVIS WITH CONTRAST  Technique:  Multidetector CT imaging of the abdomen and pelvis was performed following the standard protocol during bolus administration of intravenous contrast.  Contrast: OMNIPAQUE IOHEXOL 300 MG/ML  SOLN, 1 OMNIPAQUE IOHEXOL 300 MG/ML  SOLN  Comparison: CT angiography chest 06/13/2011  Findings: The imaged lung bases are clear, aside from focal atelectasis or scarring in the lingula.  Please also see report for recent CT of the chest dated 06/13/2011.  There is small hiatal hernia.  The stomach is moderately distended with oral contrast on today's examination.  The apparent gastric wall thickening at the fundus on yesterday's chest CT appears much less prominent on today's examination with better distention.  There is however some reason there is however some haziness and stranding in the upper central abdominal mesenteries along the course of the celiac axis and left gastric artery.  It appears less mass-like on the abdominal CT compared to yesterday's chest CT.  There is suggestion of mild periportal edema in the liver. Adjacent to the falciform ligament of the liver is a nonspecific 16 mm low density lesion.  This could reflect a benign oblong cyst or focal fatty infiltration.  There is a small amount of fluid surrounding the gallbladder.  The gallbladder mucosa is enhancing.  No definite gallbladder wall thickening.  No calcified gallstones are seen.  The common bile duct is normal in caliber.  The spleen is normal in size and enhancement.  The pancreas shows no evidence of  mass, calcification or ductal dilatation.  Small cyst lower pole right kidney.  Otherwise, the kidneys are within normal limits.  The duodenum, jejunum, and proximal ileal loops appear normal. There is some fecalization of the distal/terminal ileum.  Colon and rectum demonstrate normal wall  thickness. The appendix is normal.  No lymphadenopathy is identified in the mesenteries or retroperitoneum of the abdomen or pelvis.  The abdominal aorta is normal in caliber.  Uterus, urinary bladder, and adnexa are within normal limits. There is a small amount of free fluid in the right pelvis.  No acute or suspicious bony abnormality.  IMPRESSION:  1.  Nonspecific findings suggestive of some inflammatory changes in the upper abdomen.  Specifically, there is some mesenteric edema adjacent to the celiac axis the left gastric artery, mild periportal edema within the liver, and small amount of pericholecystic fluid.  Suggest correlation with laboratory results to exclude hepatitis or pancreatitis.  If the patient has clinical signs or symptoms of gastritis, upper endoscopy could be considered.  It is noted that the stomach is better distended on today's examination and does not show any definite persistent gastric wall thickening. 2.  Given the presence of the pericholecystic edema, if the patient has pain in the right upper quadrant, right upper quadrant ultrasound could be performed to exclude gallbladder wall thickening or noncalcified stones.  Gallbladder wall appears within normal thickness by CT. 3.  Low density lesion adjacent to the falciform ligament the liver.  This is favored to be a cyst or focal fatty infiltration. 4.  Fecalization of the distal/terminal ileum.  This can be seen in patients with chronic constipation.  Original Report Authenticated By: Britta Mccreedy, M.D.   Medications: I have reviewed the patient's current medications.  Assessment/Plan:  Acute exacerbation of chronic obstructive pulmonary disease (COPD) *Compensated and not actively wheezing *Continue supportive care with oxygen and nebulizer/MDI *Continue empiric Levaquin to cover for acute bronchitis *Continue IV steroids and taper soon *CT scan reveals severe bullous disease - needs referral to Pulmonary MD for f/u as outpt  as sx will be difficult to manage (we have arranged for 06/26/2011 at [facility] at 3:30PM) - may also need home O2 - will check RA ABG in AM  Acute respiratory failure with hypoxia *Likely has a degree of chronic hypoxemia as well related to acute COPD exac *Check ABG in the morning - likely patient will require home O2  Chronic respiratory failure with hypercapnia *Related to severe COPD *Treat symptoms and avoid excessive amounts of oxygen *Currently alert and oriented  Weight loss, non-intentional *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 *Obtain nutritional consultation *Will need to followup with primary care to continue to monitor response to medical therapy and to ensure patient doesn't have underlying malignancy *Followup CT abdomen does not demonstrate significant gastric wall thickening - nonetheless, we have discussed with the patient the absolute need to followup with a gastroenterologist for EGD as well as for routine screening colonoscopy given her age and weight loss sx  Hypokalemia *Resolved  Dehydration, severe *Appears to be euvolemic but we'll continue to monitor *Was on when necessary Lasix prior to admission which was ordered for swelling of the feet and hands  Hypotension *Related to dehydration and improving with systolic blood pressure now consistently greater than 95  Sinus bradycardia *Likely related to acute on chronic hypoxemia *Was not on beta blocker prior to admission  Pulmonary nodule seen on imaging study *4 mm nodule left lung apex *Radiologist's recommendation based on the consensus statement "  guidelines for management of small pulmonary nodules detected on CT scans..." is for followup chest CT at 6 months  Iron deficiency anemia/ Macrocytosis *B12 level is normal at 409 but iron is low at 16 with a low TIBC of 235 *These abnormalities and iron studies likely related to malnutrition *Treat underlying issues related to malnutrition and recent  weight loss  Arnold-Chiari malformation- mild *No symptoms-continue to follow  Depression *On Xanax prior to admission  Polycythemia secondary to smoking *An expected finding in this patient  Disposition *Transfer to non-telemetry floor   LOS: 3 days   Junious Silk, ANP pager [phone]  [facility] [email or web address] Password: [email or web address]  06/15/2011, 1:00 PM  I have personally examined this patient and reviewed the entire database. I have reviewed the above note, made any necessary editorial changes, and agree with its content.  Lonia Blood, MD [facility]

## 2011-06-15 NOTE — Progress Notes (Signed)
INITIAL ADULT NUTRITION ASSESSMENT Date: 06/15/2011   Time: 11:01 AM  Reason for Assessment: MD Consult + Nutrition Risk Report  ASSESSMENT: Female 51 y.o.  Dx: Acute exacerbation of chronic obstructive pulmonary disease (COPD)  Hx:  Past Medical History  Diagnosis Date  . COPD (chronic obstructive pulmonary disease)   . Anxiety   . Depression   . Hyperlipidemia    Past Surgical History  Procedure Date  . Tubal ligation    Related Meds:     . albuterol  2.5 mg Nebulization QID  . antiseptic oral rinse  15 mL Mouth Rinse BID  . Fluticasone-Salmeterol  1 puff Inhalation Q12H  . ipratropium  0.5 mg Nebulization QID  . levofloxacin  500 mg Oral Q24H  . methylPREDNISolone (SOLU-MEDROL) injection  80 mg Intravenous Q12H  . nicotine  21 mg Transdermal Daily  . pantoprazole  40 mg Oral Q1200  . DISCONTD: albuterol  2.5 mg Nebulization Q4H  . DISCONTD: ipratropium  0.5 mg Nebulization Q4H  . DISCONTD: methylPREDNISolone (SOLU-MEDROL) injection  80 mg Intravenous Q6H   Ht: 5\' 3"  (160 cm)  Wt: 133 lb (60.328 kg)  Ideal Wt: 52.3 kg % Ideal Wt: 115%  Usual Wt: 180 lb per pt % Usual Wt: 74%  Body mass index is 23.56 kg/(m^2). Weight is WNL.  Food/Nutrition Related Hx: declining wt x 1 year  Labs:  CMP     Component Value Date/Time   NA 142 06/15/2011 0535   K 3.8 06/15/2011 0535   CL 99 06/15/2011 0535   CO2 38* 06/15/2011 0535   GLUCOSE 115* 06/15/2011 0535   BUN 8 06/15/2011 0535   CREATININE 0.48* 06/15/2011 0535   CALCIUM 8.6 06/15/2011 0535   PROT 6.5 06/12/2011 2341   ALBUMIN 3.4* 06/12/2011 2341   AST 15 06/12/2011 2341   ALT 13 06/12/2011 2341   ALKPHOS 66 06/12/2011 2341   BILITOT 0.9 06/12/2011 2341   GFRNONAA >90 06/15/2011 0535   GFRAA >90 06/15/2011 0535   Magnesium  Date/Time Value Range Status  06/13/2011  9:32 AM 1.6  1.5-2.5 (mg/dL) Final     Intake/Output Summary (Last 24 hours) at 06/15/11 1103 Last data filed at 06/15/11 0900  Gross per 24 hour  Intake    2625 ml  Output    975 ml  Net   1650 ml    Diet Order: General  Supplements/Tube Feeding:  IVF:    DISCONTD: sodium chloride Last Rate: 125 mL/hr at 06/15/11 0628    Estimated Nutritional Needs:   Kcal: 1400 - 1600 kcal Protein:  66 - 78 grams protein Fluid:  at least 1.8 liters daily  Admitted with SOB. RD consulted 2/2 "Unintentional Wt Loss." Per H&P, pt has lost about 50 lbs over the last year. Pt reports that she usually weighs 180 lb and last weighed this around March 2012. Weight has been slowly declining since that time. Pt reports that she typically eats only 1-2 meals a day and has never regularly consumed 3 meals daily.  Pt drinks at least 4 cans of Pepsi daily and often has a large meal around dinner time. Pt does admit to having recent stressful events in her life recently and suspected that may be attributing to some of her weight loss, including death of her mother and moving. Based on dietary recall, suspect pt is not meeting protein needs.  Noted abnormal abdominal findings on CT. Per MD, concern for malignancy is setting of unintentional wt loss.  Pt states  that she is eating well currently and enjoys the food here. Agreeable to Ensure Complete daily. Discussed importance of eating throughout the day to maximize intake.  Pt meets criteria for moderate malnutrition in the context of social/environmental circumstances 2/2 intake of <75% of estimated energy requirement for at least 3 months, 26% wt loss x 1 year. Suspect some level of fluctuating intake to stress per pt's report however also noted possible malignancy per chart review.  NUTRITION DIAGNOSIS: -Inadequate protein energy intake (NI-5.3).  Status: Ongoing  RELATED TO: food preferences   AS EVIDENCE BY: dietary recall and ongoing wt loss  MONITORING/EVALUATION(Goals): Goal: Pt to consume at least 90% of estimated needs. Monitor: weights, labs, PO intake, I/O's  EDUCATION NEEDS: -Education needs  addressed  INTERVENTION: 1. Discussed importance of consistent intake throughout the day to maximize PO intake. 2. Ensure Complete PO daily 3. RD to continue to follow nutrition care plan  Dietitian #: [phone]  DOCUMENTATION CODES Per approved criteria  -Non-severe (moderate) malnutrition in the context of social or environmental circumstances    Adair Laundry 06/15/2011, 11:01 AM

## 2011-06-16 DIAGNOSIS — R634 Abnormal weight loss: Secondary | ICD-10-CM

## 2011-06-16 DIAGNOSIS — E876 Hypokalemia: Secondary | ICD-10-CM

## 2011-06-16 DIAGNOSIS — J441 Chronic obstructive pulmonary disease with (acute) exacerbation: Secondary | ICD-10-CM

## 2011-06-16 DIAGNOSIS — G9389 Other specified disorders of brain: Secondary | ICD-10-CM

## 2011-06-16 LAB — BLOOD GAS, ARTERIAL
Acid-Base Excess: 13.4 mmol/L — ABNORMAL HIGH (ref 0.0–2.0)
Bicarbonate: 38.6 mEq/L — ABNORMAL HIGH (ref 20.0–24.0)
FIO2: 0.45 %
O2 Saturation: 90.2 %
TCO2: 40.4 mmol/L (ref 0–100)
pCO2 arterial: 60.5 mmHg (ref 35.0–45.0)
pH, Arterial: 7.421 — ABNORMAL HIGH (ref 7.350–7.400)
pO2, Arterial: 60.6 mmHg — ABNORMAL LOW (ref 80.0–100.0)

## 2011-06-16 LAB — CBC
HCT: 55.7 % — ABNORMAL HIGH (ref 36.0–46.0)
Hemoglobin: 17.6 g/dL — ABNORMAL HIGH (ref 12.0–15.0)
MCH: 32.1 pg (ref 26.0–34.0)
MCHC: 31.6 g/dL (ref 30.0–36.0)
MCV: 101.6 fL — ABNORMAL HIGH (ref 78.0–100.0)
Platelets: 111 10*3/uL — ABNORMAL LOW (ref 150–400)
RBC: 5.48 MIL/uL — ABNORMAL HIGH (ref 3.87–5.11)
RDW: 14.9 % (ref 11.5–15.5)
WBC: 9.3 10*3/uL (ref 4.0–10.5)

## 2011-06-16 MED ORDER — PREDNISONE 20 MG PO TABS: 20.0000 mg | ORAL_TABLET | ORAL | Status: AC

## 2011-06-16 MED ORDER — NICOTINE 21 MG/24HR TD PT24: 1.0000 | MEDICATED_PATCH | Freq: Every day | TRANSDERMAL | Status: DC

## 2011-06-16 MED ORDER — LEVOFLOXACIN 500 MG PO TABS: 500.0000 mg | ORAL_TABLET | ORAL | Status: AC

## 2011-06-16 NOTE — Progress Notes (Signed)
Upon d/c gave all health education and arranged the O2 calender to take home. No concerns.

## 2011-06-16 NOTE — Progress Notes (Signed)
With 5 ltr O2 pt was having 96% sat. Took the O2 off and sat dropped down to 85% with in 2 mts, also pt stated to have cough.  Put her back on O2 5 ltr and sat came back to 93%. No other concerns.

## 2011-06-16 NOTE — Consult Note (Signed)
Unable to see pt at this time. Will revisit later.

## 2011-06-16 NOTE — Progress Notes (Signed)
Call received from respiratory reference blood gas result; MD made aware of result, which is to be follow up with attending this morning.  On coming RN made aware. Patient has been asymptomatic except for ocassional cough and  Oxygen saturation of 89% @ 0600; patient continues on oxygen @ 5l [place], MD is aware.

## 2011-06-16 NOTE — Discharge Summary (Signed)
Physician Discharge Summary  Patient ID: Donna Watkins MRN: [id number] DOB/AGE: [id number] y.o.  Admit date: 06/12/2011 Discharge date: 06/16/2011  Admission Diagnoses: Principal Problem:  *Acute exacerbation of chronic obstructive pulmonary disease (COPD)  Abnormal CT of brain  Depression  Headache  Weight loss, non-intentional  Hypokalemia  Acute respiratory failure  Macrocytosis    Discharge Diagnoses:    *Acute exacerbation of chronic obstructive pulmonary disease (COPD)  Arnold-Chiari malformation- mild  Depression  Weight loss, non-intentional  Hypokalemia  Acute respiratory failure with hypoxia  Macrocytosis  Chronic respiratory failure with hypercapnia  Dehydration, severe  Hypotension  Sinus bradycardia  Pulmonary nodule seen on imaging study  Polycythemia secondary to smoking  Iron deficiency anemia  Discharged Condition: stable, GOOD  Hospital Course: THINGS TO FOLLOW UP ON WITH PRIMARY CARE PHYSICIAN  Acute exacerbation of chronic obstructive pulmonary disease (COPD)  *Compensated and not actively wheezing  *Continue supportive care with oxygen and nebulizer/MDI  *Continue empiric Levaquin to cover for acute bronchitis  *tapering steroids over next 9 days Close follow up with primary care physician  *CT scan reveals severe bullous disease - needs referral to Pulmonary MD for f/u as outpt as sx will be difficult to manage (we have arranged for 06/26/2011 at [facility] at 3:30PM) -  The patient is going home with home oxygen arranged at 5 L per minute.  Acute respiratory failure with hypoxia  *Likely has a degree of chronic hypoxemia as well related to acute COPD exac  *The patient is going home on oxygen 5 L per minute as above.   Chronic respiratory failure with hypercapnia  *Related to severe COPD  *Treat symptoms and avoid excessive amounts of oxygen  *Currently alert and oriented and in no respiratory distress.  headache with an abnormal  CT head: This CT finding has been discussed with the neurosurgeon, Dr. Venetia Maxon. According to Dr. Deretha Emory who discussed with the neurosurgeon, they were not very impressed with a CT scan. Especially considering her clinical presentation. However, MRI was recommended. I also spoke to Dr. Roseanne Reno with neurology, and they also recommend MRI and MRA with contrast at this time. Patient is without any significant headache at this time. She does not have any neurological deficits. She's completely stable from a neurological standpoint.  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  *Will need to followup with primary care to continue to monitor response to medical therapy and to ensure patient doesn't have underlying malignancy  *Followup CT abdomen does not demonstrate significant gastric wall thickening - nonetheless, we have discussed with the patient the absolute need to followup with a gastroenterologist for EGD as well as for routine screening colonoscopy given her age and weight loss sx   Hypokalemia  *Resolved   Dehydration, severe  *Appears to be euvolemic but we'll continue to monitor  *Was on Lasix prior to admission which was ordered for swelling of the feet and hands   Hypotension  *Related to dehydration and improving with systolic blood pressure now consistently greater than 95   Sinus bradycardia  *Likely related to acute on chronic hypoxemia  *Was not on beta blocker prior to admission   Pulmonary nodule seen on imaging study  *4 mm nodule left lung apex  *Radiologist's recommendation based on the consensus statement "guidelines for management of small pulmonary nodules detected on CT scans..." is for followup chest CT at 6 months   Iron deficiency anemia/ Macrocytosis  *B12 level is normal at 409 but iron is  low at 16 with a low TIBC of 235  *These abnormalities and iron studies likely related to malnutrition  *Treat underlying issues related to  malnutrition and recent weight loss   Arnold-Chiari malformation- mild  *No symptoms-continue to follow   Depression  *On Xanax prior to admission   Polycythemia secondary to smoking  *An expected finding in this patient   Discharge Exam: The patient says she feels much better.  She's not having any shortness of breath except for when ambulating more than 500 feet.  She is going to require the home oxygen and is willing to use it at home.  She's currently requiring 5 L per minute.  She's in no distress and asking to go home.  She will followup with her primary care physician shortly.  She also agrees to followup with the pulmonologist that we scheduled for her and follow up with a gastroenterologist for further evaluation. Blood pressure 127/77, pulse 66, temperature 98.1 F (36.7 C), temperature source Oral, resp. rate 18, height 5\' 3"  (1.6 m), weight 60.328 kg (133 lb), last menstrual period 05/06/2010, SpO2 93.00%. General appearance: alert, cooperative, appears older than stated age and no distress  Resp: clear to auscultation bilaterally without wheezes but poor air movement noted th/o all areas  Cardio: regular rate and sinus rhythm, S1, S2 normal, no murmur, click, rub or gallop  GI: soft, non-tender; bowel sounds normal; no masses, no organomegaly  Extremities: extremities normal, atraumatic, no cyanosis or edema  Neurologic: Grossly normal  Disposition: Home with home oxygen  Discharge Orders    Future Appointments: Provider: Department: Dept Phone: Center:   06/26/2011 3:30 PM Julio Sicks, NP [facility] [phone] None     Future Orders Please Complete By Expires   Increase activity slowly      Discharge instructions      Comments:   RETURN IF SYMPTOMS RECUR, WORSEN OR NEW PROBLEMS DEVELOP.  FOLLOW UP ON Monday WITH PRIMARY CARE PHYSICIAN FOLLOW UP WITH LUNG DOCTOR AS SCHEDULED GET AN APPOINTMENT TO SEE A GASTROENTEROLOGIST REGARDING WEIGHT LOSS ASAP.       Medication List  As of 06/16/2011 11:22 AM   TAKE these medications         albuterol 108 (90 BASE) MCG/ACT inhaler   Commonly known as: PROVENTIL HFA;VENTOLIN HFA   Inhale 2 puffs into the lungs every 6 (six) hours as needed. For shortness of breath      albuterol (2.5 MG/3ML) 0.083% nebulizer solution   Commonly known as: PROVENTIL   Take 2.5 mg by nebulization every 6 (six) hours as needed. For shortness of breath      ALPRAZolam 1 MG tablet   Commonly known as: XANAX   Take 1 mg by mouth 4 (four) times daily as needed. For anxiety      fluticasone 50 MCG/ACT nasal spray   Commonly known as: FLONASE   Place 2 sprays into the nose daily.      Fluticasone-Salmeterol 250-50 MCG/DOSE Aepb   Commonly known as: ADVAIR   Inhale 1 puff into the lungs every 12 (twelve) hours.                  ipratropium 0.02 % nebulizer solution   Commonly known as: ATROVENT   Take 500 mcg by nebulization 4 (four) times daily as needed. For shortness of breath      levofloxacin 500 MG tablet   Commonly known as: LEVAQUIN   Take 1 tablet (500 mg total) by  mouth daily.      nicotine 21 mg/24hr patch   Commonly known as: NICODERM CQ - dosed in mg/24 hours   Place 1 patch onto the skin daily.      omeprazole 20 MG capsule   Commonly known as: PRILOSEC   Take 20 mg by mouth daily.      predniSONE 20 MG tablet   Commonly known as: DELTASONE   Take 1 tablet (20 mg total) by mouth as directed. TAKE 3 TABLETS PO BID X 3 DAYS, THEN TAKE 2 TABLET PO BID X 3 DAYS, THEN TAKE 1 TABLET PO BID X 3 DAYS, THEN TAKE 1 TABLET PO DAILY X 3 DAYS, THEN STOP           Follow-up Information    Follow up with [facility] on 06/26/2011. (An apppointment has been scheduled for you with the NP @ 330pm)    Contact information:   [address] [place] [address]       Follow up with Milana Obey, MD. Schedule an appointment as soon as possible for a visit in 5 days.  [facility] FOLLOW UP)    Contact information:   [address] [place] [address] [phone]       Follow up with GASTROENTEROLOGIST. Schedule an appointment as soon as possible for a visit in 3 weeks.        I spent 40 mins preparing discharge, reviewing hospital course, notes, records, imaging, labs, reconciling medications, arranging for follow up, counseling patient, etc.   Signed: Carlos Quackenbush 06/16/2011, 11:22 AM Pager [phone]

## 2011-06-16 NOTE — Discharge Instructions (Signed)
Chronic Obstructive Pulmonary Disease Chronic obstructive pulmonary disease (COPD) is a condition in which airflow from the lungs is restricted. The lungs can never return to normal, but there are measures you can take which will improve them and make you feel better. CAUSES   Smoking.   Exposure to secondhand smoke.   Breathing in irritants (pollution, cigarette smoke, strong smells, aerosol sprays, paint fumes).   History of lung infections.  TREATMENT  Treatment focuses on making you comfortable (supportive care). Your caregiver may prescribe medications (inhaled or pills) to help improve your breathing. HOME CARE INSTRUCTIONS   If you smoke, stop smoking.   Avoid exposure to smoke, chemicals, and fumes that aggravate your breathing.   Take antibiotic medicines as directed by your caregiver.   Avoid medicines that dry up your system and slow down the elimination of secretions (antihistamines and cough syrups). This decreases respiratory capacity and may lead to infections.   Drink enough water and fluids to keep your urine clear or pale yellow. This loosens secretions.   Use humidifiers at home and at your bedside if they do not make breathing difficult.   Receive all protective vaccines your caregiver suggests, especially pneumococcal and influenza.   Use home oxygen as suggested.   Stay active. Exercise and physical activity will help maintain your ability to do things you want to do.   Eat a healthy diet.  SEEK MEDICAL CARE IF:   You develop pus-like mucus (sputum).   Breathing is more labored or exercise becomes difficult to do.   You are running out of the medicine you take for your breathing.  SEEK IMMEDIATE MEDICAL CARE IF:   You have a rapid heart rate.   You have agitation, confusion, tremors, or are in a stupor (family members may need to observe this).   It becomes difficult to breathe.   You develop chest pain.   You have a fever.  MAKE SURE YOU:    Understand these instructions.   Will watch your condition.   Will get help right away if you are not doing well or get worse.  Document Released: 10/01/2004 Document Revised: 12/11/2010 Document Reviewed: 02/21/2010 [facility] Patient Information 2012 [place], [place].  Oxygen Use at Home Oxygen can be prescribed for home use. The prescription will show the flow rate. This is how much oxygen is to be used per minute. This will be listed in liters per minute (LPM or L/M). A liter is a metric measurement of volume. You will use oxygen therapy as directed. It can be used while exercising, sleeping, or at rest. You may need oxygen continuously. Your caregiver may order a blood oxygen test (arterial blood gas or pulse oximetry test) that will show what your oxygen level is. Your caregiver will use these measurements to learn about your needs and follow your progress. Home oxygen therapy is commonly used on patients with various lung (pulmonary) related conditions. Some of these conditions include:  Asthma.   Lung cancer.   Pneumonia.   Emphysema.   Chronic bronchitis.   Cystic fibrosis.   Other lung diseases.   Pulmonary fibrosis.   Occupational lung disease.   Heart failure.   Chronic obstructive pulmonary disease (COPD).  3 COMMON WAYS OF PROVIDING OXYGEN THERAPY  Gas: The gas form of oxygen is put into variously sized cylinders or tanks. The cylinders or oxygen tanks contain compressed oxygen. The cylinder is equipped with a regulator that controls the flow rate. Because the flow of oxygen out  of the cylinder is constant, an oxygen conserving device may be attached to the system to avoid waste. This device releases the gas only when you inhale and cuts it off when you exhale. Oxygen can be provided in a small cylinder that can be carried with you. Large tanks are heavy and are only for stationary use. After use, empty tanks must be exchanged for full tanks.   Liquid: The liquid  form of oxygen is put into a container similar to a thermos. When released, the liquid converts to a gas and you breathe it in just like the compressed gas. This storage method takes up less space than the compressed gas cylinder, and you can transfer the liquid to a small, portable vessel at home. Liquid oxygen is more expensive than the compressed gas, and the vessel vents when not in use. An oxygen conserving device may be built into the vessel to conserve the oxygen. Liquid oxygen is very cold, around 297 below zero.   Oxygen concentrator: This medical device filters oxygen from room air and gives almost 100% oxygen to the patient. Oxygen concentrators are powered by electricity. Benefits of this system are:   It does not need to be resupplied.   It is not as costly as liquid oxygen.   Extra tubing permits the user to move around easier.  There are several types of small, portable oxygen systems available which can help you remain active and mobile. You must have a cylinder of oxygen as a backup in the event of a power failure. Advise your electric power company that you are on oxygen therapy in order to get priority service when there is a power failure. OXYGEN DELIVERY DEVICES There are 3 common ways to deliver oxygen to your body.  Nasal cannula. This is a 2-pronged device inserted in the nostrils that is connected to tubing carrying the oxygen. The tubing can rest on the ears or be attached to the frame of eyeglasses.   Mask. People who need a high flow of oxygen generally use a mask.   Transtracheal catheter. Transtracheal oxygen therapy requires the insertion of a small, flexible tube (catheter) in the windpipe (trachea). This catheter is held in place by a necklace. Since transtracheal oxygen bypasses the mouth, nose, and throat, a humidifier is absolutely required at flow rates of 1 LPM or greater.  SAFETY ISSUES  Never smoke while using oxygen.   Oxygen does not burn or explode,  but materials will burn faster in the presence of oxygen.   Keep a Government social research officer close by. Let your fire department know that you have oxygen in your home.   Warn visitors not to smoke near you when you are using oxygen. Put up "no smoking" signs in your home where you most often use the oxygen.   When you go to a restaurant with your portable oxygen source, ask to be seated in the nonsmoking section.   Stay at least 5 feet away from gas stoves, candles, lighted fireplaces, or other heat sources.   Do not use materials that burn easily (flammable) while using your oxygen.   If you use an oxygen cylinder, make sure it is secured to some fixed object or in a stand. If you use liquid oxygen, make sure the vessel is kept upright to keep the oxygen from pouring out. Liquid oxygen is so cold it can hurt your skin.   If you use an oxygen concentrator, call your electric company so you will be  given priority if there is a power failure. Avoid using extension cords, if possible.  GUIDELINES FOR CLEANING YOUR EQUIPMENT  Wash the nasal prongs with a liquid soap. Thoroughly rinse them once or twice a week.   Replace the prongs every 2 to 4 weeks. If you have an infection (cold, pneumonia) change them when you are well.   Your caregiver will give you instructions on how to clean your transtracheal catheter.   The humidifier bottle should be washed with soap and warm water and rinsed thoroughly between each refill. Air-dry the bottle before filling it with sterile or distilled water. The bottle and its top should be disinfected after they are cleaned.   If you use an oxygen concentrator, unplug the unit. Then wipe down the cabinet with a damp cloth and dry it daily. The air filter should be cleaned at least twice a week.   Follow your home medical equipment and service company's directions for cleaning the compressor filter.  HOME CARE INSTRUCTIONS   Do not change the flow of oxygen unless  directed by your caregiver.   Do not use alcohol or other sedating drugs unless instructed. They slow your breathing rate.   Do not use materials that burn easily (flammable) while using your oxygen.   Always keep a spare tank of oxygen. Plan ahead for holidays when you may not be able to get a prescription filled.   Use water-based lubricants on your lips or nostrils. Do not use an oil-based product like petroleum jelly.   To prevent your cheeks or the skin behind your ears from becoming irritated, tuck some gauze under the tubing.   If you have persistent redness under your nose, call your caregiver.   When you no longer need oxygen, your doctor will have the oxygen discontinued. Oxygen is not addicting or habit-forming.   Use the oxygen as instructed. Too much oxygen can be harmful and too little will not give you the benefit you need.   Shortness of breath is not always from a lack of oxygen. If your oxygen level is not the cause of your shortness of breath, taking oxygen will not help.  SEEK MEDICAL CARE IF:   You have frequent headaches.   You have shortness of breath or a lasting cough.   You have anxiety.   You are confused.   You are drowsy or sleepy all the time.   You develop an illness which aggravates your breathing.   You cannot exercise.   You are restless.   You have blue lips or fingernails.   You have difficult or irregular breathing and it is getting worse.   You have an oral temperature above 102 F (38.9 C).  Document Released: 03/14/2003 Document Revised: 12/11/2010 Document Reviewed: 11/18/2006 [facility] Patient Information 2012 [place], [place].  Antibiotic Medication Antibiotic medicine helps fight germs. Germs cause infections. This type of medicine will not work for colds, flu, or other viral infections. Tell your doctor if you:  Are allergic to any medicines.   Are pregnant or are trying to get pregnant.   Are taking other medicines.    Have other medical problems.  HOME CARE  Take your medicine with a glass of water or food as told by your doctor.   Take the medicine as told. Finish them even if you start to feel better.   Do not give your medicine to other people.   Do not use your medicine in the future for a different infection.  Ask your doctor about which side effects to watch for.   Try not to miss any doses. If you miss a dose, take it as soon as possible. If it is almost time for your next dose, and your dosing schedule is:   Two doses a day, take the missed dose and the next dose 5 to 6 hours later.   Three or more doses a day, take the missed dose and the next dose 2 to 4 hours later, or double your next dose.   Then go back to your normal schedule.  GET HELP RIGHT AWAY IF:   You get worse or do not get better within a few days.   The medicine makes you sick.   You develop a rash or any other side effects.   You have questions or concerns.  MAKE SURE YOU:  Understand these instructions.   Will watch your condition.   Will get help right away if you are not doing well or get worse.  Document Released: 10/01/2007 Document Revised: 12/11/2010 Document Reviewed: 11/27/2008 [facility] Patient Information 2012 [place], [place].   Smoking Cessation This document explains the best ways for you to quit smoking and new treatments to help. It lists new medicines that can double or triple your chances of quitting and quitting for good. It also considers ways to avoid relapses and concerns you may have about quitting, including weight gain. NICOTINE: A POWERFUL ADDICTION If you have tried to quit smoking, you know how hard it can be. It is hard because nicotine is a very addictive drug. For some people, it can be as addictive as heroin or cocaine. Usually, people make 2 or 3 tries, or more, before finally being able to quit. Each time you try to quit, you can learn about what helps and what hurts. Quitting  takes hard work and a lot of effort, but you can quit smoking. QUITTING SMOKING IS ONE OF THE MOST IMPORTANT THINGS YOU WILL EVER DO.  You will live longer, feel better, and live better.   The impact on your body of quitting smoking is felt almost immediately:   Within 20 minutes, blood pressure decreases. Pulse returns to its normal level.   After 8 hours, carbon monoxide levels in the blood return to normal. Oxygen level increases.   After 24 hours, chance of heart attack starts to decrease. Breath, hair, and body stop smelling like smoke.   After 48 hours, damaged nerve endings begin to recover. Sense of taste and smell improve.   After 72 hours, the body is virtually free of nicotine. Bronchial tubes relax and breathing becomes easier.   After 2 to 12 weeks, lungs can hold more air. Exercise becomes easier and circulation improves.   Quitting will reduce your risk of having a heart attack, stroke, cancer, or lung disease:   After 1 year, the risk of coronary heart disease is cut in half.   After 5 years, the risk of stroke falls to the same as a nonsmoker.   After 10 years, the risk of lung cancer is cut in half and the risk of other cancers decreases significantly.   After 15 years, the risk of coronary heart disease drops, usually to the level of a nonsmoker.   If you are pregnant, quitting smoking will improve your chances of having a healthy baby.   The people you live with, especially your children, will be healthier.   You will have extra money to spend on things other  than cigarettes.  FIVE KEYS TO QUITTING Studies have shown that these 5 steps will help you quit smoking and quit for good. You have the best chances of quitting if you use them together: 1. Get ready.  2. Get support and encouragement.  3. Learn new skills and behaviors.  4. Get medicine to reduce your nicotine addiction and use it correctly.  5. Be prepared for relapse or difficult situations. Be  determined to continue trying to quit, even if you do not succeed at first.  1. GET READY  Set a quit date.   Change your environment.   Get rid of ALL cigarettes, ashtrays, matches, and lighters in your home, car, and place of work.   Do not let people smoke in your home.   Review your past attempts to quit. Think about what worked and what did not.   Once you quit, do not smoke. NOT EVEN A PUFF!  2. GET SUPPORT AND ENCOURAGEMENT Studies have shown that you have a better chance of being successful if you have help. You can get support in many ways.  Tell your family, friends, and coworkers that you are going to quit and need their support. Ask them not to smoke around you.   Talk to your caregivers (doctor, dentist, nurse, pharmacist, psychologist, and/or smoking counselor).   Get individual, group, or telephone counseling and support. The more counseling you have, the better your chances are of quitting. Programs are available at [employer] and health centers. Call your local health department for information about programs in your area.   Spiritual beliefs and practices may help some smokers quit.   Quit meters are Photographer that keep track of quit statistics, such as amount of "quit-time," cigarettes not smoked, and money saved.   Many smokers find one or more of the many self-help books available useful in helping them quit and stay off tobacco.  3. LEARN NEW SKILLS AND BEHAVIORS  Try to distract yourself from urges to smoke. Talk to someone, go for a walk, or occupy your time with a task.   When you first try to quit, change your routine. Take a different route to work. Drink tea instead of coffee. Eat breakfast in a different place.   Do something to reduce your stress. Take a hot bath, exercise, or read a book.   Plan something enjoyable to do every day. Reward yourself for not smoking.   Explore interactive web-based programs  that specialize in helping you quit.  4. GET MEDICINE AND USE IT CORRECTLY Medicines can help you stop smoking and decrease the urge to smoke. Combining medicine with the above behavioral methods and support can quadruple your chances of successfully quitting smoking. The [place] Food and Drug Administration (FDA) has approved 7 medicines to help you quit smoking. These medicines fall into 3 categories.  Nicotine replacement therapy (delivers nicotine to your body without the negative effects and risks of smoking):   Nicotine gum: Available over-the-counter.   Nicotine lozenges: Available over-the-counter.   Nicotine inhaler: Available by prescription.   Nicotine nasal spray: Available by prescription.   Nicotine skin patches (transdermal): Available by prescription and over-the-counter.   Antidepressant medicine (helps people abstain from smoking, but how this works is unknown):   Bupropion sustained-release (SR) tablets: Available by prescription.   Nicotinic receptor partial agonist (simulates the effect of nicotine in your brain):   Varenicline tartrate tablets: Available by prescription.   Ask your caregiver for  advice about which medicines to use and how to use them. Carefully read the information on the package.   Everyone who is trying to quit may benefit from using a medicine. If you are pregnant or trying to become pregnant, nursing an infant, you are under age 110, or you smoke fewer than 10 cigarettes per day, talk to your caregiver before taking any nicotine replacement medicines.   You should stop using a nicotine replacement product and call your caregiver if you experience nausea, dizziness, weakness, vomiting, fast or irregular heartbeat, mouth problems with the lozenge or gum, or redness or swelling of the skin around the patch that does not go away.   Do not use any other product containing nicotine while using a nicotine replacement product.   Talk to your caregiver  before using these products if you have diabetes, heart disease, asthma, stomach ulcers, you had a recent heart attack, you have high blood pressure that is not controlled with medicine, a history of irregular heartbeat, or you have been prescribed medicine to help you quit smoking.  5. BE PREPARED FOR RELAPSE OR DIFFICULT SITUATIONS  Most relapses occur within the first 3 months after quitting. Do not be discouraged if you start smoking again. Remember, most people try several times before they finally quit.   You may have symptoms of withdrawal because your body is used to nicotine. You may crave cigarettes, be irritable, feel very hungry, cough often, get headaches, or have difficulty concentrating.   The withdrawal symptoms are only temporary. They are strongest when you first quit, but they will go away within 10 to 14 days.  Here are some difficult situations to watch for:  Alcohol. Avoid drinking alcohol. Drinking lowers your chances of successfully quitting.   Caffeine. Try to reduce the amount of caffeine you consume. It also lowers your chances of successfully quitting.   Other smokers. Being around smoking can make you want to smoke. Avoid smokers.   Weight gain. Many smokers will gain weight when they quit, usually less than 10 pounds. Eat a healthy diet and stay active. Do not let weight gain distract you from your main goal, quitting smoking. Some medicines that help you quit smoking may also help delay weight gain. You can always lose the weight gained after you quit.   Bad mood or depression. There are a lot of ways to improve your mood other than smoking.  If you are having problems with any of these situations, talk to your caregiver. SPECIAL SITUATIONS AND CONDITIONS Studies suggest that everyone can quit smoking. Your situation or condition can give you a special reason to quit.  Pregnant women/new mothers: By quitting, you protect your baby's health and your own.    Hospitalized patients: By quitting, you reduce health problems and help healing.   Heart attack patients: By quitting, you reduce your risk of a second heart attack.   Lung, head, and neck cancer patients: By quitting, you reduce your chance of a second cancer.   Parents of children and adolescents: By quitting, you protect your children from illnesses caused by secondhand smoke.  QUESTIONS TO THINK ABOUT Think about the following questions before you try to stop smoking. You may want to talk about your answers with your caregiver.  Why do you want to quit?   If you tried to quit in the past, what helped and what did not?   What will be the most difficult situations for you after you quit? How will  you plan to handle them?   Who can help you through the tough times? Your family? Friends? Caregiver?   What pleasures do you get from smoking? What ways can you still get pleasure if you quit?  Here are some questions to ask your caregiver:  How can you help me to be successful at quitting?   What medicine do you think would be best for me and how should I take it?   What should I do if I need more help?   What is smoking withdrawal like? How can I get information on withdrawal?  Quitting takes hard work and a lot of effort, but you can quit smoking. FOR MORE INFORMATION  Smokefree.gov (http://www.davis-sullivan.com/) provides free, accurate, evidence-based information and professional assistance to help support the immediate and long-term needs of people trying to quit smoking. Document Released: 12/16/2000 Document Revised: 12/11/2010 Document Reviewed: 10/08/2008 [facility] Patient Information 2012 [place], [place].  Smoking Cessation, Tips for Success YOU CAN QUIT SMOKING If you are ready to quit smoking, congratulations! You have chosen to help yourself be healthier. Cigarettes bring nicotine, tar, carbon monoxide, and other irritants into your body. Your lungs, heart, and blood  vessels will be able to work better without these poisons. There are many different ways to quit smoking. Nicotine gum, nicotine patches, a nicotine inhaler, or nicotine nasal spray can help with physical craving. Hypnosis, support groups, and medicines help break the habit of smoking. Here are some tips to help you quit for good.  Throw away all cigarettes.   Clean and remove all ashtrays from your home, work, and car.   On a card, write down your reasons for quitting. Carry the card with you and read it when you get the urge to smoke.   Cleanse your body of nicotine. Drink enough water and fluids to keep your urine clear or pale yellow. Do this after quitting to flush the nicotine from your body.   Learn to predict your moods. Do not let a bad situation be your excuse to have a cigarette. Some situations in your life might tempt you into wanting a cigarette.   Never have "just one" cigarette. It leads to wanting another and another. Remind yourself of your decision to quit.   Change habits associated with smoking. If you smoked while driving or when feeling stressed, try other activities to replace smoking. Stand up when drinking your coffee. Brush your teeth after eating. Sit in a different chair when you read the paper. Avoid alcohol while trying to quit, and try to drink fewer caffeinated beverages. Alcohol and caffeine may urge you to smoke.   Avoid foods and drinks that can trigger a desire to smoke, such as sugary or spicy foods and alcohol.   Ask people who smoke not to smoke around you.   Have something planned to do right after eating or having a cup of coffee. Take a walk or exercise to perk you up. This will help to keep you from overeating.   Try a relaxation exercise to calm you down and decrease your stress. Remember, you may be tense and nervous for the first 2 weeks after you quit, but this will pass.   Find new activities to keep your hands busy. Play with a pen, coin, or  rubber band. Doodle or draw things on paper.   Brush your teeth right after eating. This will help cut down on the craving for the taste of tobacco after meals. You can try mouthwash,  too.   Use oral substitutes, such as lemon drops, carrots, a cinnamon stick, or chewing gum, in place of cigarettes. Keep them handy so they are available when you have the urge to smoke.   When you have the urge to smoke, try deep breathing.   Designate your home as a nonsmoking area.   If you are a heavy smoker, ask your caregiver about a prescription for nicotine chewing gum. It can ease your withdrawal from nicotine.   Reward yourself. Set aside the cigarette money you save and buy yourself something nice.   Look for support from others. Join a support group or smoking cessation program. Ask someone at home or at work to help you with your plan to quit smoking.   Always ask yourself, "Do I need this cigarette or is this just a reflex?" Tell yourself, "Today, I choose not to smoke," or "I do not want to smoke." You are reminding yourself of your decision to quit, even if you do smoke a cigarette.  HOW WILL I FEEL WHEN I QUIT SMOKING?  The benefits of not smoking start within days of quitting.   You may have symptoms of withdrawal because your body is used to nicotine (the addictive substance in cigarettes). You may crave cigarettes, be irritable, feel very hungry, cough often, get headaches, or have difficulty concentrating.   The withdrawal symptoms are only temporary. They are strongest when you first quit but will go away within 10 to 14 days.   When withdrawal symptoms occur, stay in control. Think about your reasons for quitting. Remind yourself that these are signs that your body is healing and getting used to being without cigarettes.   Remember that withdrawal symptoms are easier to treat than the major diseases that smoking can cause.   Even after the withdrawal is over, expect periodic urges to  smoke. However, these cravings are generally short-lived and will go away whether you smoke or not. Do not smoke!   If you relapse and smoke again, do not lose hope. Most smokers quit 3 times before they are successful.   If you relapse, do not give up! Plan ahead and think about what you will do the next time you get the urge to smoke.  LIFE AS A NONSMOKER: MAKE IT FOR A MONTH, MAKE IT FOR LIFE Day 1: Hang this page where you will see it every day. Day 2: Get rid of all ashtrays, matches, and lighters. Day 3: Drink water. Breathe deeply between sips. Day 4: Avoid places with smoke-filled air, such as bars, clubs, or the smoking section of restaurants. Day 5: Keep track of how much money you save by not smoking. Day 6: Avoid boredom. Keep a good book with you or go to the movies. Day 7: Reward yourself! One week without smoking! Day 8: Make a dental appointment to get your teeth cleaned. Day 9: Decide how you will turn down a cigarette before it is offered to you. Day 10: Review your reasons for quitting. Day 11: Distract yourself. Stay active to keep your mind off smoking and to relieve tension. Take a walk, exercise, read a book, do a crossword puzzle, or try a new hobby. Day 12: Exercise. Get off the bus before your stop or use stairs instead of escalators. Day 13: Call on friends for support and encouragement. Day 14: Reward yourself! Two weeks without smoking! Day 15: Practice deep breathing exercises. Day 16: Bet a friend that you can stay a nonsmoker.  Day 17: Ask to sit in nonsmoking sections of restaurants. Day 18: Hang up "No Smoking" signs. Day 19: Think of yourself as a nonsmoker. Day 20: Each morning, tell yourself you will not smoke. Day 21: Reward yourself! Three weeks without smoking! Day 22: Think of smoking in negative ways. Remember how it stains your teeth, gives you bad breath, and leaves you short of breath. Day 23: Eat a nutritious breakfast. Day 24:Do not relive  your days as a smoker. Day 25: Hold a pencil in your hand when talking on the telephone. Day 26: Tell all your friends you do not smoke. Day 27: Think about how much better food tastes. Day 28: Remember, one cigarette is one too many. Day 29: Take up a hobby that will keep your hands busy. Day 30: Congratulations! One month without smoking! Give yourself a big reward. Your caregiver can direct you to community resources or hospitals for support, which may include:  Group support.   Education.   Hypnosis.   Subliminal therapy.  Document Released: 09/20/2003 Document Revised: 12/11/2010 Document Reviewed: 10/08/2008 [facility] Patient Information 2012 [place], [place].  Nicotine skin patches What is this medicine? NICOTINE (NIK oh teen) helps people stop smoking. The patches replace the nicotine found in cigarettes and help to decrease withdrawal effects. They are most effective when used in combination with a stop-smoking program. This medicine may be used for other purposes; ask your health care provider or pharmacist if you have questions. What should I tell my health care provider before I take this medicine? They need to know if you have any of these conditions: -diabetes -heart disease, angina, irregular heartbeat or previous heart attack -lung disease, including asthma -overactive thyroid -pheochromocytoma -skin problems -stomach problems or ulcers -an unusual or allergic reaction to nicotine, adhesives, other medicines, foods, dyes, or preservatives -pregnant or trying to get pregnant -breast-feeding How should I use this medicine? This medicine is for use on the skin. Follow the directions that come with the patches. Find an area of skin on your upper arm, chest, or back that is clean, dry, greaseless, undamaged and hairless. Wash hands with plain soap and water. Do not use anything that contains aloe, lanolin or glycerin as these may prevent the patch from sticking. Dry  thoroughly. Remove the patch from the sealed pouch. Do not try to cut or trim the patch. Using your palm, press the patch firmly in place for 10 seconds to make sure that there is good contact with your skin. After applying the patch, wash your hands. Change the patch every day, keeping to a regular schedule. When you apply a new patch, use a new area of skin. Wait at least 1 week before using the same area again. Talk to your pediatrician regarding the use of this medicine in children. Special care may be needed. Overdosage: If you think you have taken too much of this medicine contact a poison control center or emergency room at once. NOTE: This medicine is only for you. Do not share this medicine with others. What if I miss a dose? If you forget to replace a patch, use it as soon as you can. Only use one patch at a time and do not leave on the skin for longer than directed. If a patch falls off, you can replace it, but keep to your schedule and remove the patch at the right time. What may interact with this medicine? -medicines for asthma -medicines for blood pressure -medicines for mental depression  This list may not describe all possible interactions. Give your health care provider a list of all the medicines, herbs, non-prescription drugs, or dietary supplements you use. Also tell them if you smoke, drink alcohol, or use illegal drugs. Some items may interact with your medicine. What should I watch for while using this medicine? Do not smoke, chew nicotine gum, or use snuff while you are using this medicine. This reduces the chance of a nicotine overdose. You can keep the patch in place during swimming, bathing, and showering. If your patch falls off during these activities, replace it. When you first apply the patch, your skin may itch or burn. This should soon go away. When you remove a patch, the skin may look red, but this should only last for a day. Call your doctor or health care  professional if you get a permanent skin rash. If you are a diabetic and you quit smoking, the effects of insulin may be increased and you may need to reduce your insulin dose. Check with your doctor or health care professional about how you should adjust your insulin dose. If you are going to have a magnetic resonance imaging (MRI) procedure, tell your MRI technician if you have this patch on your body. It must be removed before a MRI. What side effects may I notice from receiving this medicine? Side effects that you should report to your doctor or health care professional as soon as possible: -allergic reactions like skin rash, itching or hives, swelling of the face, lips, or tongue -breathing problems -changes in hearing -changes in vision -chest pain -cold sweats -confusion -fast, irregular heartbeat -feeling faint or lightheaded, falls -headache -increased saliva -nausea, vomiting -skin redness that lasts more than 4 days -stomach pain -weakness Side effects that usually do not require medical attention (report to your doctor or health care professional if they continue or are bothersome): -diarrhea -dry mouth -hiccups -irritability -nervousness or restlessness -trouble sleeping or vivid dreams This list may not describe all possible side effects. Call your doctor for medical advice about side effects. You may report side effects to FDA at 1-800-FDA-1088. Where should I keep my medicine? Keep out of the reach of children. Store at room temperature between 20 and 25 degrees C (68 and 77 degrees F). Protect from heat and light. Store in Tax inspector until ready to use. Throw away unused medicine after the expiration date. When you remove a patch, fold with sticky sides together; put in an empty opened pouch and throw away. NOTE: This sheet is a summary. It may not cover all possible information. If you have questions about this medicine, talk to your doctor, pharmacist,  or health care provider.  2012, Elsevier/Gold Standard. (02/25/2010 1:06:00 PM)   Smoking Hazards Smoking cigarettes is extremely bad for your health. Tobacco smoke has over 200 known poisons in it. There are over 60 chemicals in tobacco smoke that cause cancer. Some of the chemicals found in cigarette smoke include:   Cyanide.   Benzene.   Formaldehyde.   Methanol (wood alcohol).   Acetylene (fuel used in welding torches).   Ammonia.  Cigarette smoke also contains the poisonous gases nitrogen oxide and carbon monoxide.  Cigarette smokers have an increased risk of many serious medical problems, including:  Lung cancer.   Lung disease (such as pneumonia, bronchitis, and emphysema).   Heart attack and chest pain due to the heart not getting enough oxygen (angina).   Heart disease and peripheral blood vessel disease.  Hypertension.   Stroke.   Oral cancer (cancer of the lip, mouth, or voice box).   Bladder cancer.   Pancreatic cancer.   Cervical cancer.   Pregnancy complications, including premature birth.   Low birthweight babies.   Early menopause.   Lower estrogen level for women.   Infertility.   Facial wrinkles.   Blindness.   Increased risk of broken bones (fractures).   Senile dementia.   Stillbirths and smaller newborn babies, birth defects, and genetic damage to sperm.   Stomach ulcers and internal bleeding.  Children of smokers have an increased risk of the following, because of secondhand smoke exposure:   Sudden infant death syndrome (SIDS).   Respiratory infections.   Lung cancer.   Heart disease.   Ear infections.  Smoking causes approximately:  90% of all lung cancer deaths in men.   80% of all lung cancer deaths in women.   90% of deaths from chronic obstructive lung disease.  Compared with nonsmokers, smoking increases the risk of:  Coronary heart disease by 2 to 4 times.   Stroke by 2 to 4 times.   Men developing  lung cancer by 23 times.   Women developing lung cancer by 13 times.   Dying from chronic obstructive lung diseases by 12 times.  Someone who smokes 2 packs a day loses about 8 years of his or her life. Even smoking lightly shortens your life expectancy by several years. You can greatly reduce the risk of medical problems for you and your family by stopping now. Smoking is the most preventable cause of death and disease in our society. Within days of quitting smoking, your circulation returns to normal, you decrease the risk of having a heart attack, and your lung capacity improves. There may be some increased phlegm in the first few days after quitting, and it may take months for your lungs to clear up completely. Quitting for 10 years cuts your lung cancer risk to almost that of a nonsmoker. WHY IS SMOKING ADDICTIVE?  Nicotine is the chemical agent in tobacco that is capable of causing addiction or dependence.   When you smoke and inhale, nicotine is absorbed rapidly into the bloodstream through your lungs. Nicotine absorbed through the lungs is capable of creating a powerful addiction. Both inhaled and non-inhaled nicotine may be addictive.   Addiction studies of cigarettes and spit tobacco show that addiction to nicotine occurs mainly during the teen years, when young people begin using tobacco products.  WHAT ARE THE BENEFITS OF QUITTING?  There are many health benefits to quitting smoking.   Likelihood of developing cancer and heart disease decreases. Health improvements are seen almost immediately.   Blood pressure, pulse rate, and breathing patterns start returning to normal soon after quitting.   People who quit may see an improvement in their overall quality of life.  Some people choose to quit all at once. Other options include nicotine replacement products, such as patches, gum, and nasal sprays. Do not use these products without first checking with your caregiver. QUITTING  SMOKING It is not easy to quit smoking. Nicotine is addicting, and longtime habits are hard to change. To start, you can write down all your reasons for quitting, tell your family and friends you want to quit, and ask for their help. Throw your cigarettes away, chew gum or cinnamon sticks, keep your hands busy, and drink extra water or juice. Go for walks and practice deep breathing to relax. Think of all the  money you are saving: around $1,000 a year, for the average pack-a-day smoker. Nicotine patches and gum have been shown to improve success at efforts to stop smoking. Zyban (bupropion) is an anti-depressant drug that can be prescribed to reduce nicotine withdrawal symptoms and to suppress the urge to smoke. Smoking is an addiction with both physical and psychological effects. Joining a stop-smoking support group can help you cope with the emotional issues. For more information and advice on programs to stop smoking, call your doctor, your local hospital, or these organizations:  American Lung Association - 1-800-LUNGUSA   American Cancer Society - 1-800-ACS-2345  Document Released: 01/30/2004 Document Revised: 12/11/2010 Document Reviewed: 10/03/2008 [facility] Patient Information 2012 [place], [place].  Please stop smoking and please usually nicotine patches to help you quit.  Please do not smoking use the nicotine patches at the same time.

## 2011-06-26 ENCOUNTER — Inpatient Hospital Stay: Payer: Medicare Other | Admitting: Adult Health

## 2011-06-30 ENCOUNTER — Encounter: Payer: Self-pay | Admitting: Pulmonary Disease

## 2011-06-30 ENCOUNTER — Other Ambulatory Visit (INDEPENDENT_AMBULATORY_CARE_PROVIDER_SITE_OTHER): Payer: Medicare Other

## 2011-06-30 ENCOUNTER — Other Ambulatory Visit (HOSPITAL_COMMUNITY): Payer: Self-pay | Admitting: Family Medicine

## 2011-06-30 ENCOUNTER — Ambulatory Visit (INDEPENDENT_AMBULATORY_CARE_PROVIDER_SITE_OTHER): Payer: Medicare Other | Admitting: Pulmonary Disease

## 2011-06-30 VITALS — BP 112/72 | HR 78 | Temp 98.5°F | Ht 63.0 in | Wt 149.2 lb

## 2011-06-30 DIAGNOSIS — J4489 Other specified chronic obstructive pulmonary disease: Secondary | ICD-10-CM

## 2011-06-30 DIAGNOSIS — Z139 Encounter for screening, unspecified: Secondary | ICD-10-CM

## 2011-06-30 DIAGNOSIS — R911 Solitary pulmonary nodule: Secondary | ICD-10-CM

## 2011-06-30 DIAGNOSIS — J449 Chronic obstructive pulmonary disease, unspecified: Secondary | ICD-10-CM

## 2011-06-30 NOTE — Patient Instructions (Addendum)
Taper off prednisone Stay on 2.5 L O2, chk satn on this while walking to advise CT scan in 6 months to follow nodules Rehab program Check Potassium level today Stop lasix YOU have to QUIT smoking

## 2011-06-30 NOTE — Assessment & Plan Note (Signed)
Taper off prednisone Stay on 2.5 L O2, chk satn on this while walking to advise CT scan in 6 months to follow nodules Rehab program Check Potassium level today Stop lasix

## 2011-06-30 NOTE — Progress Notes (Signed)
  Subjective:    Patient ID: Donna Watkins, female    DOB: 01/30/1960, 51 y.o.   MRN: [id number]  HPI PCP - Sudie Bailey  50/F smoker recent adm 6/53from [facility] to [facility] for  COPD flaresuspected sub arachnoid hemorrhage. She was admitted with weight loss, headache & worsening DOE x months &  hypoxia. She required bipap in the ED for ABG of 7.37/79/63 on 6L [place] ABG on 6/11 was 7.42/60/61/90% MRA head was neg for [facility] CT angio was neg for PE, showed  Emphysematous changes and biapical scarring. Biapical nodular  opacities also likely reflect sequelae of scarring  CT abdomen was neg. She was treated with levaquin, steroids & dc'd  home with home oxygen arranged at 5 L per minute.  She is now trying to quit smoking. Oxygen helps- she is 93% on 2.5 L [place] at rest & does not desatn on walking 3 laps around the office  Past Medical History  Diagnosis Date  . COPD (chronic obstructive pulmonary disease)   . Anxiety   . Depression   . Hyperlipidemia    Past Surgical History  Procedure Date  . Tubal ligation     No Known Allergies  History   Social History  . Marital Status: Divorced    Spouse Name: N/A    Number of Children: N/A  . Years of Education: N/A   Occupational History  . Not on file.   Social History Main Topics  . Smoking status: Former Smoker -- 2.5 packs/day for 30 years    Types: Cigarettes    Quit date: 06/12/2011  . Smokeless tobacco: Never Used  . Alcohol Use: No  . Drug Use: No  . Sexually Active: Not on file   Other Topics Concern  . Not on file   Social History Narrative  . No narrative on file   ABG    Component Value Date/Time   PHART 7.421* 06/16/2011 0630   PCO2ART 60.5* 06/16/2011 0630   PO2ART 60.6* 06/16/2011 0630   HCO3 38.6* 06/16/2011 0630   TCO2 40.4 06/16/2011 0630   O2SAT 90.2 06/16/2011 0630      Review of Systems  Constitutional: negative for anorexia, fevers and sweats  POS for wt loss Eyes: negative for irritation, redness and  visual disturbance  Ears, nose, mouth, throat, and face: negative for earaches, epistaxis, nasal congestion and sore throat  Respiratory: POS for cough, dyspnea on exertion, sputum and wheezing  Cardiovascular: negative for chest pain, dyspnea, lower extremity edema, orthopnea, palpitations and syncope  Gastrointestinal: negative for abdominal pain, constipation, diarrhea, melena, nausea and vomiting  Genitourinary:negative for dysuria, frequency and hematuria  Hematologic/lymphatic: negative for bleeding, easy bruising and lymphadenopathy  Musculoskeletal:negative for arthralgias, muscle weakness and stiff joints  Neurological: negative for coordination problems, gait problems, headaches and weakness  Endocrine: negative for diabetic symptoms including polydipsia, polyuria and weight loss     Objective:   Physical Exam  Gen. Pleasant, thin woman, in no distress, normal affect ENT - no lesions, no post nasal drip Neck: No JVD, no thyromegaly, no carotid bruits Lungs: no use of accessory muscles, no dullness to percussion, decreased without rales or rhonchi  Cardiovascular: Rhythm regular, heart sounds  normal, no murmurs or gallops, no peripheral edema Abdomen: soft and non-tender, no hepatosplenomegaly, BS normal. Musculoskeletal: No deformities, no cyanosis or clubbing Neuro:  alert, non focal       Assessment & Plan:

## 2011-07-01 ENCOUNTER — Ambulatory Visit (HOSPITAL_COMMUNITY)
Admission: RE | Admit: 2011-07-01 | Discharge: 2011-07-01 | Disposition: A | Discharge status: Home / Self Care | Payer: Medicare Other | Source: Ambulatory Visit | Attending: Family Medicine | Admitting: Family Medicine

## 2011-07-01 DIAGNOSIS — Z1231 Encounter for screening mammogram for malignant neoplasm of breast: Secondary | ICD-10-CM | POA: Insufficient documentation

## 2011-07-01 DIAGNOSIS — Z139 Encounter for screening, unspecified: Secondary | ICD-10-CM

## 2011-07-01 LAB — BASIC METABOLIC PANEL
BUN: 13 mg/dL (ref 6–23)
CO2: 38 mEq/L — ABNORMAL HIGH (ref 19–32)
Calcium: 8.9 mg/dL (ref 8.4–10.5)
Creatinine, Ser: 0.5 mg/dL (ref 0.4–1.2)
GFR: 145.73 mL/min (ref >60.00)
Glucose, Bld: 109 mg/dL — ABNORMAL HIGH (ref 70–99)
Potassium: 4.4 mEq/L (ref 3.5–5.1)

## 2011-07-13 NOTE — Assessment & Plan Note (Signed)
FU CT in 42m for stability

## 2011-08-03 ENCOUNTER — Encounter: Payer: Self-pay | Admitting: Adult Health

## 2011-08-03 ENCOUNTER — Ambulatory Visit (INDEPENDENT_AMBULATORY_CARE_PROVIDER_SITE_OTHER): Payer: Medicare Other | Admitting: Adult Health

## 2011-08-03 VITALS — BP 118/64 | HR 77 | Temp 98.1°F | Ht 63.0 in | Wt 147.2 lb

## 2011-08-03 DIAGNOSIS — J449 Chronic obstructive pulmonary disease, unspecified: Secondary | ICD-10-CM

## 2011-08-03 DIAGNOSIS — J4489 Other specified chronic obstructive pulmonary disease: Secondary | ICD-10-CM

## 2011-08-03 NOTE — Patient Instructions (Addendum)
Continue on current regimen.  Great job quitting smoking.  follow up Dr. Vassie Loll  In 3 months and As needed

## 2011-08-03 NOTE — Progress Notes (Signed)
Subjective:    Patient ID: Donna Watkins, female    DOB: 1960-11-16, 51 y.o.   MRN: [id number]  HPI  PCP - Sudie Bailey  50/F smoker recent adm 6/78from [facility] for  COPD flare suspected sub arachnoid hemorrhage. She was admitted with weight loss, headache & worsening DOE x months &  hypoxia. She required bipap in the ED for ABG of 7.37/79/63 on 6L [place] ABG on 6/11 was 7.42/60/61/90% MRA head was neg for [facility]. CT angio was neg for PE, showed  Emphysematous changes and biapical scarring. Biapical nodular  opacities also likely reflect sequelae of scarring  CT abdomen was neg. She was treated with levaquin, steroids & dc'd  home with home oxygen arranged at 5 L per minute.  She is now trying to quit smoking. Oxygen helps- she is 93% on 2.5 L [place] at rest & does not desatn on walking 3 laps around the office  08/03/11 Follow  Returns for 1 month follow up COPD - reports breathing "is good".  no new complaints Has quit smoking  No flare in cough or dyspnea Has finished prednisone taper.     Past Medical History  Diagnosis Date  . COPD (chronic obstructive pulmonary disease)   . Anxiety   . Depression   . Hyperlipidemia    Past Surgical History  Procedure Date  . Tubal ligation     No Known Allergies  History   Social History  . Marital Status: Divorced    Spouse Name: N/A    Number of Children: N/A  . Years of Education: N/A   Occupational History  . Not on file.   Social History Main Topics  . Smoking status: Former Smoker -- 2.5 packs/day for 30 years    Types: Cigarettes    Quit date: 06/12/2011  . Smokeless tobacco: Never Used  . Alcohol Use: No  . Drug Use: No  . Sexually Active: Not on file   Other Topics Concern  . Not on file   Social History Narrative  . No narrative on file   ABG    Component Value Date/Time   PHART 7.421* 06/16/2011 0630   PCO2ART 60.5* 06/16/2011 0630   PO2ART 60.6* 06/16/2011 0630   HCO3 38.6* 06/16/2011 0630   TCO2 40.4  06/16/2011 0630   O2SAT 90.2 06/16/2011 0630      Review of Systems Constitutional:   No  weight loss, night sweats,  Fevers, chills, + fatigue, or  lassitude.  HEENT:   No headaches,  Difficulty swallowing,  Tooth/dental problems, or  Sore throat,                No sneezing, itching, ear ache, nasal congestion, post nasal drip,   CV:  No chest pain,  Orthopnea, PND, swelling in lower extremities, anasarca, dizziness, palpitations, syncope.   GI  No heartburn, indigestion, abdominal pain, nausea, vomiting, diarrhea, change in bowel habits, loss of appetite, bloody stools.   Resp:    No coughing up of blood.  No change in color of mucus.  No wheezing.  No chest wall deformity  Skin: no rash or lesions.  GU: no dysuria, change in color of urine, no urgency or frequency.  No flank pain, no hematuria   MS:  No joint pain or swelling.  No decreased range of motion.  No back pain.  Psych:  No change in mood or affect. No depression or anxiety.  No memory loss.  Objective:   Physical Exam   Gen. Pleasant, thin woman, in no distress, normal affect ENT - no lesions, no post nasal drip Neck: No JVD, no thyromegaly, no carotid bruits Lungs: no use of accessory muscles, no dullness to percussion, decreased without rales or rhonchi  Cardiovascular: Rhythm regular, heart sounds  normal, no murmurs or gallops, no peripheral edema Abdomen: soft and non-tender, no hepatosplenomegaly, BS normal. Musculoskeletal: No deformities, no cyanosis or clubbing Neuro:  alert, non focal       Assessment & Plan:

## 2011-08-04 NOTE — Assessment & Plan Note (Signed)
Compensated on present regimen.  No changes Congratulated on smoking cesstation

## 2011-10-08 ENCOUNTER — Telehealth: Payer: Self-pay | Admitting: Pulmonary Disease

## 2011-10-08 NOTE — Telephone Encounter (Signed)
Called pt and left message x3 to make a follow up apt. No return call back. Sent letter 10/08/11 for pt to call and schd follow up.  °

## 2011-12-25 ENCOUNTER — Telehealth: Payer: Self-pay | Admitting: Pulmonary Disease

## 2011-12-25 DIAGNOSIS — R911 Solitary pulmonary nodule: Secondary | ICD-10-CM

## 2011-12-25 NOTE — Telephone Encounter (Signed)
I do not have any orders in my queue to sign - so do not know how to do this - pl put it in again if required

## 2011-12-25 NOTE — Telephone Encounter (Signed)
I spoke with Misty Stanley and she states they it's showing on there end that Dr. Vassie Loll has not "signed" pt's CT order. So either he needs to sign the order. Please advise if you can sign this order in epic Dr. Vassie Loll thanks

## 2011-12-25 NOTE — Telephone Encounter (Signed)
I have put this is bc they are still saying this is unsigned by Dr. Vassie Loll. Please sign off on order Dr. Vassie Loll thanks.

## 2011-12-28 ENCOUNTER — Ambulatory Visit (HOSPITAL_COMMUNITY)
Admission: RE | Admit: 2011-12-28 | Discharge: 2011-12-28 | Disposition: A | Discharge status: Home / Self Care | Payer: Medicare Other | Source: Ambulatory Visit | Attending: Pulmonary Disease | Admitting: Pulmonary Disease

## 2011-12-28 DIAGNOSIS — J449 Chronic obstructive pulmonary disease, unspecified: Secondary | ICD-10-CM | POA: Insufficient documentation

## 2011-12-28 DIAGNOSIS — R918 Other nonspecific abnormal finding of lung field: Secondary | ICD-10-CM | POA: Insufficient documentation

## 2011-12-28 DIAGNOSIS — R911 Solitary pulmonary nodule: Secondary | ICD-10-CM

## 2011-12-28 DIAGNOSIS — J4489 Other specified chronic obstructive pulmonary disease: Secondary | ICD-10-CM | POA: Insufficient documentation

## 2011-12-28 DIAGNOSIS — F17200 Nicotine dependence, unspecified, uncomplicated: Secondary | ICD-10-CM | POA: Insufficient documentation

## 2011-12-31 ENCOUNTER — Telehealth: Payer: Self-pay | Admitting: Pulmonary Disease

## 2011-12-31 NOTE — Telephone Encounter (Signed)
Notes Recorded by Oretha Milch, MD on 12/28/2011 at 4:46 PM LUL nodule has resolved compared to 6/13 New 4mm RUL nodule needs 6-12 month FU CT      Pt aware of results and will follow up on 01-08-12 at 2 pm with RA as she is pass due for OV.

## 2012-01-04 ENCOUNTER — Telehealth: Payer: Self-pay | Admitting: Pulmonary Disease

## 2012-01-04 NOTE — Telephone Encounter (Signed)
Rehab closed at [facility] for today. Will need to call back in the morning.

## 2012-01-05 NOTE — Telephone Encounter (Signed)
ok 

## 2012-01-05 NOTE — Telephone Encounter (Signed)
RA, do you wish to refer the pt to pulm rehab at [facility]? Please advise thanks!

## 2012-01-05 NOTE — Telephone Encounter (Signed)
LMTCB for Donna Watkins

## 2012-01-05 NOTE — Telephone Encounter (Signed)
Spoke with Diane at [facility] rehab-- she is aware that Dr. Vassie Loll is ok with this.  Diane unsure if refertral made on June 2013 is still good.  Per Diane, she thinks this will suffice. If not I have informed her to call our office and a new order will be placed.  Nothing further needed at this time.

## 2012-01-07 ENCOUNTER — Other Ambulatory Visit: Payer: Self-pay | Admitting: Pulmonary Disease

## 2012-01-07 DIAGNOSIS — J441 Chronic obstructive pulmonary disease with (acute) exacerbation: Secondary | ICD-10-CM

## 2012-01-07 DIAGNOSIS — J449 Chronic obstructive pulmonary disease, unspecified: Secondary | ICD-10-CM

## 2012-01-08 ENCOUNTER — Ambulatory Visit: Payer: Medicare Other | Admitting: Pulmonary Disease

## 2012-01-15 ENCOUNTER — Telehealth: Payer: Self-pay | Admitting: Pulmonary Disease

## 2012-01-15 NOTE — Telephone Encounter (Signed)
Spoke with Diane at [facility] She states that she is unable to reach the pt after many attempts  She states that this is the second referral for this service without a response from the pt I verified that she is calling the number that we have in the system I called the pt myself and LMOMTCB If we cant reach her will just forward to RA and let him know

## 2012-01-18 NOTE — Telephone Encounter (Signed)
Spoke with pt advised her she needed to call pulm rehab and let them  Know if she is still wanting to participate at this time or wait. Advised her  That pulm rehab has a waiting list and takes a while to get in. Gave her phone  Number to call and confirm .verbally understood nothing further needed.

## 2012-01-18 NOTE — Telephone Encounter (Addendum)
lmomtcb for pt 

## 2012-02-09 ENCOUNTER — Ambulatory Visit: Payer: Medicare Other | Admitting: Pulmonary Disease

## 2012-02-29 ENCOUNTER — Encounter: Payer: Self-pay | Admitting: Pulmonary Disease

## 2012-02-29 ENCOUNTER — Telehealth: Payer: Self-pay | Admitting: Pulmonary Disease

## 2012-02-29 ENCOUNTER — Ambulatory Visit (INDEPENDENT_AMBULATORY_CARE_PROVIDER_SITE_OTHER): Payer: Medicare Other | Admitting: Pulmonary Disease

## 2012-02-29 VITALS — BP 112/76 | HR 77 | Temp 97.7°F | Ht 63.0 in | Wt 192.4 lb

## 2012-02-29 DIAGNOSIS — J449 Chronic obstructive pulmonary disease, unspecified: Secondary | ICD-10-CM

## 2012-02-29 DIAGNOSIS — J4489 Other specified chronic obstructive pulmonary disease: Secondary | ICD-10-CM

## 2012-02-29 DIAGNOSIS — R911 Solitary pulmonary nodule: Secondary | ICD-10-CM

## 2012-02-29 NOTE — Patient Instructions (Addendum)
Decrease O2 to 2L/m with oximiser & 4L/m at home Check ABG at [facility] on these settings Pulmonary rehab - you have to increase activity Get pulse oximeter to measure O2 saturation levels,  >90% ok Trial of melatonin 5 mg at 9 pm to see if we can restore your nighttime sleep pattern

## 2012-02-29 NOTE — Progress Notes (Signed)
  Subjective:    Patient ID: Donna Watkins, female    DOB: 1960-01-07, 52 y.o.   MRN: [id number]  HPI PCP - Sudie Bailey   51/F for FU of COPD on home O2.  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  She was admitted with weight loss, headache & worsening DOE x months & hypoxia.  She required bipap in the ED for ABG of 7.37/79/63 on 6L [place]  ABG on 6/11 was 7.42/60/61/90%  MRA head was neg for [facility].  CT angio was neg for PE, showed Emphysematous changes and biapical scarring. Biapical nodular  opacities also likely reflect sequelae of scarring  CT abdomen was neg.  She was treated with levaquin, steroids & dc'd home with home oxygen arranged at 5 L per minute.  Oxygen helps- she is 93% on 2.5 L [place] at rest & does not desatn on walking 3 laps around the office    02/29/2012 Accompanied by sister breathing has not been good. pt has not been exercising and never started pulm rehab. C/o cough w/ clear phkem, chest tx, occasional wheezing w/ exertion.  She c/o days & nights being messed up Ct chest 12/13 LUL nodule has resolved compared to 6/13  4mm RUL nodule persists Quit smoking 6/13 , gained wt, dyspneic on daily activities She uses 2.5 L with oximiser & 5L at home   Past Medical History  Diagnosis Date  . COPD (chronic obstructive pulmonary disease)   . Anxiety   . Depression   . Hyperlipidemia     Review of Systems neg for any significant sore throat, dysphagia, itching, sneezing, nasal congestion or excess/ purulent secretions, fever, chills, sweats, unintended wt loss, pleuritic or exertional cp, hempoptysis, orthopnea pnd or change in chronic leg swelling. Also denies presyncope, palpitations, heartburn, abdominal pain, nausea, vomiting, diarrhea or change in bowel or urinary habits, dysuria,hematuria, rash, arthralgias, visual complaints, headache, numbness weakness or ataxia.     Objective:   Physical Exam   Gen. Pleasant, well-nourished,  in no distress ENT - no lesions, no post nasal drip Neck: No JVD, no thyromegaly, no carotid bruits Lungs: no use of accessory muscles, no dullness to percussion, clear without rales or rhonchi  Cardiovascular: Rhythm regular, heart sounds  normal, no murmurs or gallops, no peripheral edema Musculoskeletal: No deformities, no cyanosis or clubbing         Assessment & Plan:

## 2012-02-29 NOTE — Assessment & Plan Note (Signed)
Decrease O2 to 2L/m with oximiser & 4L/m at home Check ABG at [facility] on these settings Pulmonary rehab - you have to increase activity Get pulse oximeter to measure O2 saturation levels,  >90% ok Trial of melatonin 5 mg at 9 pm to see if we can restore your nighttime sleep pattern    

## 2012-02-29 NOTE — Telephone Encounter (Signed)
Order was placed. Will forward to [facility] so they are for when they schedule ABG.

## 2012-02-29 NOTE — Telephone Encounter (Signed)
She needs spirometry pre-post scheduled - never got this done - can do at [facility] with ABG or can do in our office

## 2012-02-29 NOTE — Assessment & Plan Note (Signed)
RPt CT in 1 yr for stability

## 2012-02-29 NOTE — Addendum Note (Signed)
Addended by: Tommie Sams on: 02/29/2012 05:33 PM   Modules accepted: Orders

## 2012-03-01 ENCOUNTER — Telehealth: Payer: Self-pay | Admitting: Pulmonary Disease

## 2012-03-01 DIAGNOSIS — J449 Chronic obstructive pulmonary disease, unspecified: Secondary | ICD-10-CM

## 2012-03-01 HISTORY — DX: Chronic obstructive pulmonary disease, unspecified: J44.9

## 2012-03-01 NOTE — Telephone Encounter (Signed)
ABG'S and spiro pre and post scheduled @[facility] 03-09-12

## 2012-03-01 NOTE — Telephone Encounter (Signed)
I have made pt aware pulm rehab will be contacting her to keep a watch out on there phone call. She voiced her understanding. Will sign off message

## 2012-03-09 ENCOUNTER — Ambulatory Visit (HOSPITAL_COMMUNITY): Payer: Medicare Other | Attending: Family Medicine

## 2012-03-09 ENCOUNTER — Ambulatory Visit (HOSPITAL_COMMUNITY): Admission: RE | Admit: 2012-03-09 | Payer: Medicare Other | Source: Ambulatory Visit

## 2012-03-17 ENCOUNTER — Telehealth: Payer: Self-pay | Admitting: Pulmonary Disease

## 2012-03-17 NOTE — Telephone Encounter (Signed)
I spoke with pt. She stated she will need to re-qualify for her O2 by 06/15/12. She has appt scheduled w/ TP on 06/09/12 at 4:00. She will have this done at that time. Nothing further was needed

## 2012-04-08 ENCOUNTER — Telehealth: Payer: Self-pay | Admitting: Pulmonary Disease

## 2012-04-08 DIAGNOSIS — J9601 Acute respiratory failure with hypoxia: Secondary | ICD-10-CM

## 2012-04-08 NOTE — Telephone Encounter (Signed)
Pt is scheduled for 4-9/14 at 2:30 at [facility]. Pt is to go over to admitting. I called and made pt aware of this. She voiced her understanding I called michelle bc per RA her wants her to have this done at these setting and did not put this in order before this was scheduled: Decrease O2 to 2L/m with oximiser & 4L/m at home  Check ABG at [facility] on these settings She asked that I fax this over to her so she can give this to RT. I have done so [phone]. Nothing further was needed

## 2012-04-13 ENCOUNTER — Ambulatory Visit (HOSPITAL_COMMUNITY): Payer: Medicare Other

## 2012-04-14 ENCOUNTER — Encounter (HOSPITAL_COMMUNITY): Payer: Medicare Other

## 2012-04-21 ENCOUNTER — Encounter (HOSPITAL_COMMUNITY)
Admission: RE | Admit: 2012-04-21 | Discharge: 2012-04-21 | Disposition: A | Discharge status: Home / Self Care | Payer: Medicare Other | Source: Ambulatory Visit | Attending: Pulmonary Disease | Admitting: Pulmonary Disease

## 2012-04-21 ENCOUNTER — Encounter (HOSPITAL_COMMUNITY): Payer: Medicare Other

## 2012-04-21 ENCOUNTER — Telehealth: Payer: Self-pay | Admitting: Pulmonary Disease

## 2012-04-21 ENCOUNTER — Encounter (HOSPITAL_COMMUNITY): Payer: Self-pay

## 2012-04-21 DIAGNOSIS — Z5189 Encounter for other specified aftercare: Secondary | ICD-10-CM | POA: Insufficient documentation

## 2012-04-21 DIAGNOSIS — J4489 Other specified chronic obstructive pulmonary disease: Secondary | ICD-10-CM | POA: Insufficient documentation

## 2012-04-21 DIAGNOSIS — J449 Chronic obstructive pulmonary disease, unspecified: Secondary | ICD-10-CM | POA: Insufficient documentation

## 2012-04-21 NOTE — Progress Notes (Signed)
Patient was referred to Pulmonary Rehab by Dr. Vassie Loll due to COPD 496. During orientation advised patient on arrival and appointment times what to wear, what to do before, during and after exercise. Reviewed attendance and class policy. Talked about inclement weather and class consultation policy. Pt is scheduled to start Pulm Rehab on 04/25/12 at 1 pm. Pt was advised to come to class 5 minutes before class starts. He was also given instructions on meeting with the dietician and attending the Family Structure classes. Pt is eager to get started. Patient performed the 6 minute pre-walk test. We also discussed what pursed-lip breathing is and how to preform it to improve breathing.

## 2012-04-21 NOTE — Patient Instructions (Signed)
Pt has finished orientation and is scheduled to start PR on 04/25/12 at 1 pm. Pt has been instructed to arrive to class 15 minutes early for scheduled class. Pt has been instructed to wear comfortable clothing and shoes with rubber soles. Pt has been told to take their medications 1 hour prior to coming to class.  If the patient is not going to attend class, he/she has been instructed to call.   

## 2012-04-21 NOTE — Telephone Encounter (Signed)
I spoke with Donna Watkins. She stated pt decided she will do both at the same time. Nothing further was eneded

## 2012-04-25 ENCOUNTER — Encounter (HOSPITAL_COMMUNITY)
Admission: RE | Admit: 2012-04-25 | Discharge: 2012-04-25 | Disposition: A | Discharge status: Home / Self Care | Payer: Medicare Other | Source: Ambulatory Visit | Attending: Pulmonary Disease | Admitting: Pulmonary Disease

## 2012-04-27 ENCOUNTER — Encounter (HOSPITAL_COMMUNITY)
Admission: RE | Admit: 2012-04-27 | Discharge: 2012-04-27 | Disposition: A | Discharge status: Home / Self Care | Payer: Medicare Other | Source: Ambulatory Visit | Attending: Pulmonary Disease | Admitting: Pulmonary Disease

## 2012-04-27 ENCOUNTER — Ambulatory Visit (HOSPITAL_COMMUNITY)
Admission: RE | Admit: 2012-04-27 | Discharge: 2012-04-27 | Disposition: A | Discharge status: Home / Self Care | Payer: Medicare Other | Source: Ambulatory Visit | Attending: Pulmonary Disease | Admitting: Pulmonary Disease

## 2012-04-27 DIAGNOSIS — R0989 Other specified symptoms and signs involving the circulatory and respiratory systems: Secondary | ICD-10-CM | POA: Insufficient documentation

## 2012-04-27 DIAGNOSIS — J4489 Other specified chronic obstructive pulmonary disease: Secondary | ICD-10-CM | POA: Insufficient documentation

## 2012-04-27 DIAGNOSIS — R0609 Other forms of dyspnea: Secondary | ICD-10-CM | POA: Insufficient documentation

## 2012-04-27 DIAGNOSIS — J9601 Acute respiratory failure with hypoxia: Secondary | ICD-10-CM

## 2012-04-27 DIAGNOSIS — J449 Chronic obstructive pulmonary disease, unspecified: Secondary | ICD-10-CM

## 2012-04-27 LAB — BLOOD GAS, ARTERIAL
Acid-Base Excess: 6.7 mmol/L — ABNORMAL HIGH (ref 0.0–2.0)
Bicarbonate: 32.1 mEq/L — ABNORMAL HIGH (ref 20.0–24.0)
O2 Content: 2 L/min
Patient temperature: 37
TCO2: 28.9 mmol/L (ref 0–100)
pCO2 arterial: 59.7 mmHg (ref 35.0–45.0)
pH, Arterial: 7.35 (ref 7.350–7.450)
pO2, Arterial: 73.8 mmHg — ABNORMAL LOW (ref 80.0–100.0)

## 2012-04-27 MED ORDER — ALBUTEROL SULFATE (5 MG/ML) 0.5% IN NEBU
2.5000 mg | INHALATION_SOLUTION | Freq: Once | RESPIRATORY_TRACT | Status: AC
  Administered 2012-04-27: 2.5 mg via RESPIRATORY_TRACT

## 2012-05-02 ENCOUNTER — Encounter (HOSPITAL_COMMUNITY): Payer: Medicare Other

## 2012-05-02 LAB — PULMONARY FUNCTION TEST

## 2012-05-04 ENCOUNTER — Encounter (HOSPITAL_COMMUNITY): Payer: Medicare Other

## 2012-05-04 NOTE — Procedures (Signed)
Donna Watkins, Donna Watkins NO.:  [id number]  MEDICAL RECORD NO.:  [id number]  LOCATION:                                 FACILITY:  PHYSICIAN:  Ranson Belluomini L. Juanetta Gosling, M.D.DATE OF BIRTH:  Jan 17, 1960  DATE OF PROCEDURE:  05/02/2012 DATE OF DISCHARGE:                           PULMONARY FUNCTION TEST   Reason for pulmonary function testing is acute respiratory failure. 1. Spirometry shows a severe ventilatory defect with evidence of     airflow obstruction. 2. Lung volumes show marked air trapping. 3. DLCO is moderately reduced. 4. Airflow obstruction.  Airway resistance is elevated confirming the     presence of airflow obstruction. 5. Arterial blood gas shows elevated pCO2 and relatively normal pH     suggesting chronic respiratory failure. 6. This study is consistent with COPD.     Loran Auguste L. Juanetta Gosling, M.D.     ELH/MEDQ  D:  05/02/2012  T:  05/03/2012  Job:  [id number]  cc:   Oretha Milch, MD [address] [place] [address]

## 2012-05-09 ENCOUNTER — Encounter (HOSPITAL_COMMUNITY)
Admission: RE | Admit: 2012-05-09 | Discharge: 2012-05-09 | Disposition: A | Discharge status: Home / Self Care | Payer: Medicare Other | Source: Ambulatory Visit | Attending: Pulmonary Disease | Admitting: Pulmonary Disease

## 2012-05-09 DIAGNOSIS — J449 Chronic obstructive pulmonary disease, unspecified: Secondary | ICD-10-CM | POA: Insufficient documentation

## 2012-05-09 DIAGNOSIS — J4489 Other specified chronic obstructive pulmonary disease: Secondary | ICD-10-CM | POA: Insufficient documentation

## 2012-05-09 DIAGNOSIS — Z5189 Encounter for other specified aftercare: Secondary | ICD-10-CM | POA: Insufficient documentation

## 2012-05-11 ENCOUNTER — Encounter (HOSPITAL_COMMUNITY)
Admission: RE | Admit: 2012-05-11 | Discharge: 2012-05-11 | Disposition: A | Discharge status: Home / Self Care | Payer: Medicare Other | Source: Ambulatory Visit | Attending: Pulmonary Disease | Admitting: Pulmonary Disease

## 2012-05-12 LAB — PULMONARY FUNCTION TEST

## 2012-05-16 ENCOUNTER — Encounter (HOSPITAL_COMMUNITY)
Admission: RE | Admit: 2012-05-16 | Discharge: 2012-05-16 | Disposition: A | Discharge status: Home / Self Care | Payer: Medicare Other | Source: Ambulatory Visit | Attending: Pulmonary Disease | Admitting: Pulmonary Disease

## 2012-05-18 ENCOUNTER — Encounter (HOSPITAL_COMMUNITY)
Admission: RE | Admit: 2012-05-18 | Discharge: 2012-05-18 | Disposition: A | Discharge status: Home / Self Care | Payer: Medicare Other | Source: Ambulatory Visit | Attending: Pulmonary Disease | Admitting: Pulmonary Disease

## 2012-05-23 ENCOUNTER — Encounter (HOSPITAL_COMMUNITY): Payer: Medicare Other

## 2012-05-25 ENCOUNTER — Encounter (HOSPITAL_COMMUNITY)
Admission: RE | Admit: 2012-05-25 | Discharge: 2012-05-25 | Disposition: A | Discharge status: Home / Self Care | Payer: Medicare Other | Source: Ambulatory Visit | Attending: Pulmonary Disease | Admitting: Pulmonary Disease

## 2012-05-26 ENCOUNTER — Telehealth: Payer: Self-pay | Admitting: Pulmonary Disease

## 2012-05-26 NOTE — Telephone Encounter (Signed)
ABG on 2L/m oximiser was OK - stay on this level at rest - she may require more during exercise which rehab will adjust Pl obtain pFTs for me

## 2012-05-26 NOTE — Telephone Encounter (Signed)
Called and spoke with pt and she is requesting results of the PFT and ABG that was done on 4/23.    Pt stated that she is also doing pulmonary rehab and they increase her oxygen while she is exercising.  Pt is wanting to know if she will need to do this at home as well. She stated she is on her concentrator at home at 4 liters and on 2 liters on the portable tank.  RA please advise.  Thanks  No Known Allergies

## 2012-05-26 NOTE — Telephone Encounter (Signed)
Will call [facility] in AM to get results. And once RA advises on this will call with results.

## 2012-05-27 NOTE — Telephone Encounter (Signed)
I spoke with patient about results and she verbalized understanding and had no questions 

## 2012-05-27 NOTE — Telephone Encounter (Signed)
I called for results. Will await

## 2012-05-27 NOTE — Telephone Encounter (Signed)
Results have been received and giving to Dr. Vassie Loll

## 2012-05-27 NOTE — Telephone Encounter (Signed)
PFTs showed severe COPD with fev1 only 26%, diffusion at 52%

## 2012-05-30 ENCOUNTER — Encounter (HOSPITAL_COMMUNITY): Payer: Medicare Other

## 2012-06-01 ENCOUNTER — Encounter (HOSPITAL_COMMUNITY): Payer: Medicare Other

## 2012-06-06 ENCOUNTER — Encounter (HOSPITAL_COMMUNITY)
Admission: RE | Admit: 2012-06-06 | Discharge: 2012-06-06 | Disposition: A | Discharge status: Home / Self Care | Payer: Medicare Other | Source: Ambulatory Visit | Attending: Pulmonary Disease | Admitting: Pulmonary Disease

## 2012-06-06 DIAGNOSIS — J4489 Other specified chronic obstructive pulmonary disease: Secondary | ICD-10-CM | POA: Insufficient documentation

## 2012-06-06 DIAGNOSIS — J449 Chronic obstructive pulmonary disease, unspecified: Secondary | ICD-10-CM | POA: Insufficient documentation

## 2012-06-06 DIAGNOSIS — Z5189 Encounter for other specified aftercare: Secondary | ICD-10-CM | POA: Insufficient documentation

## 2012-06-08 ENCOUNTER — Encounter (HOSPITAL_COMMUNITY)
Admission: RE | Admit: 2012-06-08 | Discharge: 2012-06-08 | Disposition: A | Discharge status: Home / Self Care | Payer: Medicare Other | Source: Ambulatory Visit | Attending: Pulmonary Disease | Admitting: Pulmonary Disease

## 2012-06-09 ENCOUNTER — Ambulatory Visit (INDEPENDENT_AMBULATORY_CARE_PROVIDER_SITE_OTHER): Payer: Medicare Other | Admitting: Adult Health

## 2012-06-09 ENCOUNTER — Encounter: Payer: Self-pay | Admitting: Adult Health

## 2012-06-09 VITALS — BP 118/70 | HR 86 | Temp 97.9°F | Ht 63.0 in | Wt 188.4 lb

## 2012-06-09 DIAGNOSIS — J449 Chronic obstructive pulmonary disease, unspecified: Secondary | ICD-10-CM

## 2012-06-09 DIAGNOSIS — Z23 Encounter for immunization: Secondary | ICD-10-CM

## 2012-06-09 DIAGNOSIS — J4489 Other specified chronic obstructive pulmonary disease: Secondary | ICD-10-CM

## 2012-06-09 NOTE — Progress Notes (Signed)
  Subjective:    Patient ID: Donna Watkins, female    DOB: [birth year], 52 y.o.   MRN: [id number]  HPI  PCP - Sudie Bailey   51/F for FU of COPD on home O2.  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  She was admitted with weight loss, headache & worsening DOE x months & hypoxia.  She required bipap in the ED for ABG of 7.37/79/63 on 6L [place]  ABG on 6/11 was 7.42/60/61/90%  MRA head was neg for [facility].  CT angio was neg for PE, showed Emphysematous changes and biapical scarring. Biapical nodular  opacities also likely reflect sequelae of scarring  CT abdomen was neg.  She was treated with levaquin, steroids & dc'd home with home oxygen arranged at 5 L per minute.  Oxygen helps- she is 93% on 2.5 L [place] at rest & does not desatn on walking 3 laps around the office    02/29/12  Accompanied by sister breathing has not been good. pt has not been exercising and never started pulm rehab. C/o cough w/ clear phkem, chest tx, occasional wheezing w/ exertion.  She c/o days & nights being messed up Ct chest 12/13 LUL nodule has resolved compared to 6/13  4mm RUL nodule persists Quit smoking 6/13 , gained wt, dyspneic on daily activities She uses 2.5 L with oximiser & 5L at home  06/09/2012  Returns for follow up .  At baseline, no flare of cough or dyspnea.  DOE is at her baseline.  Started Pulmonary rehab in April . Likes it well.  No ER visits, no admission.  Believes Prilosec is causing loose stools.  No hemoptysis, chest pain or edema.   Past Medical History  Diagnosis Date  . COPD (chronic obstructive pulmonary disease)   . Anxiety   . Depression   . Hyperlipidemia   . COPD (chronic obstructive pulmonary disease) 03/01/2012    Review of Systems  neg for any significant sore throat, dysphagia, itching, sneezing, nasal congestion or excess/ purulent secretions, fever, chills, sweats, unintended wt loss, pleuritic or exertional cp, hempoptysis, orthopnea  pnd or change in chronic leg swelling. Also denies presyncope, palpitations, heartburn, abdominal pain, nausea, vomiting, diarrhea or change in bowel or urinary habits, dysuria,hematuria, rash, arthralgias, visual complaints, headache, numbness weakness or ataxia.     Objective:   Physical Exam    Gen. Pleasant, well-nourished, in no distress ENT - no lesions, no post nasal drip Neck: No JVD, no thyromegaly, no carotid bruits Lungs: no use of accessory muscles, no dullness to percussion, clear without rales or rhonchi  Cardiovascular: Rhythm regular, heart sounds  normal, no murmurs or gallops, no peripheral edema Musculoskeletal: No deformities, no cyanosis or clubbing         Assessment & Plan:

## 2012-06-09 NOTE — Patient Instructions (Addendum)
May stop prilosec for now May use Pepcid As needed  Heartburn.  Continue on Advair Twice daily  .  Continue on Ipratropium and Albuterol every 4 hr as needed.  Continue with pulmonary rehab.  Pneumovax today  Follow up Dr. Vassie Loll  In 4 months and As needed

## 2012-06-10 NOTE — Assessment & Plan Note (Addendum)
Compensated on present regimen.  Stop prilosec for now to see if helps with loose stools.   Plan  May stop prilosec for now May use Pepcid As needed  Heartburn.  Continue on Advair Twice daily  .  Continue on Ipratropium and Albuterol every 4 hr as needed.  Continue with pulmonary rehab.  Pneumovax today  Follow up Dr. Vassie Loll  In 4 months and As needed

## 2012-06-13 ENCOUNTER — Encounter (HOSPITAL_COMMUNITY)
Admission: RE | Admit: 2012-06-13 | Discharge: 2012-06-13 | Disposition: A | Discharge status: Home / Self Care | Payer: Medicare Other | Source: Ambulatory Visit | Attending: Pulmonary Disease | Admitting: Pulmonary Disease

## 2012-06-15 ENCOUNTER — Encounter (HOSPITAL_COMMUNITY): Payer: Medicare Other

## 2012-06-17 ENCOUNTER — Encounter: Payer: Self-pay | Admitting: Pulmonary Disease

## 2012-06-20 ENCOUNTER — Encounter (HOSPITAL_COMMUNITY)
Admission: RE | Admit: 2012-06-20 | Discharge: 2012-06-20 | Disposition: A | Discharge status: Home / Self Care | Payer: Medicare Other | Source: Ambulatory Visit | Attending: Pulmonary Disease | Admitting: Pulmonary Disease

## 2012-06-22 ENCOUNTER — Encounter (HOSPITAL_COMMUNITY)
Admission: RE | Admit: 2012-06-22 | Discharge: 2012-06-22 | Disposition: A | Discharge status: Home / Self Care | Payer: Medicare Other | Source: Ambulatory Visit | Attending: Pulmonary Disease | Admitting: Pulmonary Disease

## 2012-06-27 ENCOUNTER — Encounter (HOSPITAL_COMMUNITY)
Admission: RE | Admit: 2012-06-27 | Discharge: 2012-06-27 | Disposition: A | Discharge status: Home / Self Care | Payer: Medicare Other | Source: Ambulatory Visit | Attending: Pulmonary Disease | Admitting: Pulmonary Disease

## 2012-06-29 ENCOUNTER — Encounter (HOSPITAL_COMMUNITY): Payer: Medicare Other

## 2012-06-30 ENCOUNTER — Ambulatory Visit: Payer: Medicare Other | Admitting: Adult Health

## 2012-07-04 ENCOUNTER — Encounter (HOSPITAL_COMMUNITY): Payer: Medicare Other

## 2012-07-04 ENCOUNTER — Other Ambulatory Visit (HOSPITAL_COMMUNITY): Payer: Self-pay | Admitting: Family Medicine

## 2012-07-04 DIAGNOSIS — Z139 Encounter for screening, unspecified: Secondary | ICD-10-CM

## 2012-07-06 ENCOUNTER — Encounter (HOSPITAL_COMMUNITY): Payer: Medicare Other

## 2012-07-11 ENCOUNTER — Ambulatory Visit (HOSPITAL_COMMUNITY)
Admission: RE | Admit: 2012-07-11 | Discharge: 2012-07-11 | Disposition: A | Discharge status: Home / Self Care | Payer: Medicare Other | Source: Ambulatory Visit | Attending: Family Medicine | Admitting: Family Medicine

## 2012-07-11 ENCOUNTER — Encounter (HOSPITAL_COMMUNITY)
Admission: RE | Admit: 2012-07-11 | Discharge: 2012-07-11 | Disposition: A | Discharge status: Home / Self Care | Payer: Medicare Other | Source: Ambulatory Visit | Attending: Pulmonary Disease | Admitting: Pulmonary Disease

## 2012-07-11 DIAGNOSIS — Z1231 Encounter for screening mammogram for malignant neoplasm of breast: Secondary | ICD-10-CM | POA: Insufficient documentation

## 2012-07-11 DIAGNOSIS — Z139 Encounter for screening, unspecified: Secondary | ICD-10-CM

## 2012-07-11 DIAGNOSIS — J4489 Other specified chronic obstructive pulmonary disease: Secondary | ICD-10-CM | POA: Insufficient documentation

## 2012-07-11 DIAGNOSIS — J449 Chronic obstructive pulmonary disease, unspecified: Secondary | ICD-10-CM | POA: Insufficient documentation

## 2012-07-11 DIAGNOSIS — Z5189 Encounter for other specified aftercare: Secondary | ICD-10-CM | POA: Insufficient documentation

## 2012-07-13 ENCOUNTER — Encounter (HOSPITAL_COMMUNITY)
Admission: RE | Admit: 2012-07-13 | Discharge: 2012-07-13 | Disposition: A | Discharge status: Home / Self Care | Payer: Medicare Other | Source: Ambulatory Visit | Attending: Pulmonary Disease | Admitting: Pulmonary Disease

## 2012-07-18 ENCOUNTER — Encounter (HOSPITAL_COMMUNITY)
Admission: RE | Admit: 2012-07-18 | Discharge: 2012-07-18 | Disposition: A | Discharge status: Home / Self Care | Payer: Medicare Other | Source: Ambulatory Visit | Attending: Pulmonary Disease | Admitting: Pulmonary Disease

## 2012-07-20 ENCOUNTER — Encounter (HOSPITAL_COMMUNITY)
Admission: RE | Admit: 2012-07-20 | Discharge: 2012-07-20 | Disposition: A | Discharge status: Home / Self Care | Payer: Medicare Other | Source: Ambulatory Visit | Attending: Pulmonary Disease | Admitting: Pulmonary Disease

## 2012-07-25 ENCOUNTER — Encounter (HOSPITAL_COMMUNITY): Payer: Medicare Other

## 2012-07-27 ENCOUNTER — Encounter (HOSPITAL_COMMUNITY): Payer: Medicare Other

## 2012-08-01 ENCOUNTER — Encounter (HOSPITAL_COMMUNITY)
Admission: RE | Admit: 2012-08-01 | Discharge: 2012-08-01 | Disposition: A | Discharge status: Home / Self Care | Payer: Medicare Other | Source: Ambulatory Visit | Attending: Pulmonary Disease | Admitting: Pulmonary Disease

## 2012-08-02 ENCOUNTER — Telehealth: Payer: Self-pay

## 2012-08-02 NOTE — Telephone Encounter (Signed)
Pt is scheduled OV with [facility] on 08/30/2012 at 2:30 PM for hemorrhoids and constipation.

## 2012-08-03 ENCOUNTER — Encounter (HOSPITAL_COMMUNITY): Payer: Medicare Other

## 2012-08-08 ENCOUNTER — Encounter (HOSPITAL_COMMUNITY): Payer: Medicare Other

## 2012-08-10 ENCOUNTER — Encounter (HOSPITAL_COMMUNITY): Payer: Medicare Other

## 2012-08-11 ENCOUNTER — Telehealth: Payer: Self-pay | Admitting: Pulmonary Disease

## 2012-08-11 NOTE — Telephone Encounter (Signed)
I called and gave pt names of meds and she will see which is cheaper for her and let us know. Will sign off message in the meantime

## 2012-08-11 NOTE — Telephone Encounter (Signed)
Pt is currently on Advair 500/50. This rx is too expensive for the pt. She is requesting an alternative.   Dr. Vassie Loll - please advise. Thanks.

## 2012-08-11 NOTE — Telephone Encounter (Signed)
Can try symbicort 160 or advair 250 or dulera - whichever is on her formulary

## 2012-08-15 ENCOUNTER — Encounter (HOSPITAL_COMMUNITY): Payer: Medicare Other

## 2012-08-17 ENCOUNTER — Encounter (HOSPITAL_COMMUNITY): Payer: Medicare Other

## 2012-08-22 ENCOUNTER — Encounter (HOSPITAL_COMMUNITY)
Admission: RE | Admit: 2012-08-22 | Discharge: 2012-08-22 | Disposition: A | Discharge status: Home / Self Care | Payer: Medicare Other | Source: Ambulatory Visit | Attending: Pulmonary Disease | Admitting: Pulmonary Disease

## 2012-08-22 DIAGNOSIS — Z5189 Encounter for other specified aftercare: Secondary | ICD-10-CM | POA: Insufficient documentation

## 2012-08-22 DIAGNOSIS — J4489 Other specified chronic obstructive pulmonary disease: Secondary | ICD-10-CM | POA: Insufficient documentation

## 2012-08-22 DIAGNOSIS — J449 Chronic obstructive pulmonary disease, unspecified: Secondary | ICD-10-CM | POA: Insufficient documentation

## 2012-08-24 ENCOUNTER — Encounter (HOSPITAL_COMMUNITY)
Admission: RE | Admit: 2012-08-24 | Discharge: 2012-08-24 | Disposition: A | Discharge status: Home / Self Care | Payer: Medicare Other | Source: Ambulatory Visit | Attending: Pulmonary Disease | Admitting: Pulmonary Disease

## 2012-08-29 ENCOUNTER — Encounter (HOSPITAL_COMMUNITY)
Admission: RE | Admit: 2012-08-29 | Discharge: 2012-08-29 | Disposition: A | Discharge status: Home / Self Care | Payer: Medicare Other | Source: Ambulatory Visit | Attending: Pulmonary Disease | Admitting: Pulmonary Disease

## 2012-08-30 ENCOUNTER — Ambulatory Visit (INDEPENDENT_AMBULATORY_CARE_PROVIDER_SITE_OTHER): Payer: Medicare Other | Admitting: Gastroenterology

## 2012-08-30 ENCOUNTER — Encounter: Payer: Self-pay | Admitting: Gastroenterology

## 2012-08-30 VITALS — BP 115/70 | HR 87 | Temp 97.4°F | Ht 63.0 in | Wt 187.0 lb

## 2012-08-30 DIAGNOSIS — Z1211 Encounter for screening for malignant neoplasm of colon: Secondary | ICD-10-CM

## 2012-08-30 MED ORDER — LINACLOTIDE 145 MCG PO CAPS: 145.0000 ug | ORAL_CAPSULE | Freq: Every day | ORAL | Status: DC

## 2012-08-30 MED ORDER — PEG 3350-KCL-NA BICARB-NACL 420 G PO SOLR: 4000.0000 mL | ORAL | Status: DC

## 2012-08-30 NOTE — Progress Notes (Signed)
Primary Care Physician:  Milana Obey, MD Primary Gastroenterologist:  Dr. Darrick Penna   Chief Complaint  Patient presents with  . Colonoscopy  . Constipation  . Hemorrhoids    HPI:   [name] is a 52 year old female presenting today for a screening colonoscopy at the request of Dr. Sudie Bailey. States last June everything she ate ran right through her, loss of appetite. Ended up with COPD exacerbation June 2013. Since then, she is dealing with more constipation. Feels like it has to do with her diet. Small BM this morning. 2-3 times per week. Low-volume paper hematochezia. No abdominal pain currently. Weighed in the 130s in June 2013, now she is at her normal weight. No nausea or vomiting. No issues with indigestion, takes Prilosec prn. No dysphagia.   CT in June 2013 while inpatient noted non-specific findings of inflammatory changes in upper abdomen. No persistent gastric wall thickening. Again, no concerning upper GI features at time of appt.   Past Medical History  Diagnosis Date  . Anxiety     panic attacks  . Depression   . Hyperlipidemia   . COPD (chronic obstructive pulmonary disease) 03/01/2012  . GERD (gastroesophageal reflux disease)     Past Surgical History  Procedure Laterality Date  . Tubal ligation      Current Outpatient Prescriptions  Medication Sig Dispense Refill  . albuterol (PROVENTIL HFA;VENTOLIN HFA) 108 (90 BASE) MCG/ACT inhaler Inhale 2 puffs into the lungs every 6 (six) hours as needed. For shortness of breath      . albuterol (PROVENTIL) (2.5 MG/3ML) 0.083% nebulizer solution Take 2.5 mg by nebulization every 4 (four) hours as needed. For shortness of breath      . ALPRAZolam (XANAX) 1 MG tablet Take 1 mg by mouth 4 (four) times daily as needed. For anxiety      . aspirin 81 MG tablet Take 81 mg by mouth daily.      . fenofibrate micronized (LOFIBRA) 134 MG capsule Take 134 mg by mouth daily before breakfast.      . Fluticasone-Salmeterol  (ADVAIR) 500-50 MCG/DOSE AEPB Inhale 1 puff into the lungs every 12 (twelve) hours.      Marland Kitchen ipratropium (ATROVENT) 0.02 % nebulizer solution Take 500 mcg by nebulization 4 (four) times daily as needed. For shortness of breath      . omeprazole (PRILOSEC) 20 MG capsule Take 20 mg by mouth daily.       No current facility-administered medications for this visit.    Allergies as of 08/30/2012  . (No Known Allergies)    Family History  Problem Relation Age of Onset  . COPD Mother   . Colon cancer Neg Hx     History   Social History  . Marital Status: Divorced    Spouse Name: N/A    Number of Children: N/A  . Years of Education: N/A   Occupational History  . Not on file.   Social History Main Topics  . Smoking status: Former Smoker -- 2.50 packs/day for 30 years    Types: Cigarettes    Quit date: 06/12/2011  . Smokeless tobacco: Never Used  . Alcohol Use: No  . Drug Use: No  . Sexual Activity: Not on file   Other Topics Concern  . Not on file   Social History Narrative  . No narrative on file    Review of Systems: Negative unless mentioned in HPI  Physical Exam: BP 115/70  Pulse 87  Temp(Src) 97.4 F (36.3 C) (  Oral)  Ht 5\' 3"  (1.6 m)  Wt 187 lb (84.823 kg)  BMI 33.13 kg/m2 General:   Alert and oriented. Pleasant and cooperative. Well-nourished and well-developed. O2 4 liters continuous Head:  Normocephalic and atraumatic. Eyes:  Without icterus, sclera clear and conjunctiva pink.  Ears:  Normal auditory acuity. Nose:  No deformity, discharge,  or lesions. Mouth:  No deformity or lesions, oral mucosa pink.  Neck:  Supple, without mass or thyromegaly. Lungs:  Clear to auscultation bilaterally. Diminished bases Heart:  S1, S2 present without murmurs appreciated.  Abdomen:  +BS, soft, non-tender and non-distended. No HSM noted. No guarding or rebound. No masses appreciated.  Rectal:  Deferred  Msk:  Symmetrical without gross deformities. Normal  posture. Extremities:  Without edema. Neurologic:  Alert and  oriented x4;  grossly normal neurologically. Skin:  Intact without significant lesions or rashes. Cervical Nodes:  No significant cervical adenopathy. Psych:  Alert and cooperative. Normal mood and affect.   CT abd/pelvis with contrast June 2013:  IMPRESSION:  1. Nonspecific findings suggestive of some inflammatory changes in the upper abdomen. Specifically, there is some mesenteric edema  adjacent to the celiac axis the left gastric artery, mild  periportal edema within the liver, and small amount of  pericholecystic fluid. Suggest correlation with laboratory results to exclude hepatitis or pancreatitis. If the patient has clinical signs or symptoms of gastritis, upper endoscopy could be considered. It is noted that the stomach is better distended on today's examination and does not show any definite persistent gastric wall thickening.  2. Given the presence of the pericholecystic edema, if the patient has pain in the right upper quadrant, right upper quadrant ultrasound could be performed to exclude gallbladder wall thickening or noncalcified stones. Gallbladder wall appears within  normal thickness by CT.  3. Low density lesion adjacent to the falciform ligament the liver. This is favored to be a cyst or focal fatty infiltration.  4. Fecalization of the distal/terminal ileum. This can be seen in patients with chronic constipation.  June 2014 outside labs: Hgb 12.9  Hct 40 Plts 135

## 2012-08-30 NOTE — Patient Instructions (Addendum)
For constipation: Start taking Linzess 1 capsule each morning on an empty stomach. Make sure this is 30 minutes before breakfast to avoid diarrhea.  We have scheduled you for a colonoscopy with Dr. Darrick Penna in the near future.

## 2012-08-31 ENCOUNTER — Encounter (HOSPITAL_COMMUNITY)
Admission: RE | Admit: 2012-08-31 | Discharge: 2012-08-31 | Disposition: A | Discharge status: Home / Self Care | Payer: Medicare Other | Source: Ambulatory Visit | Attending: Pulmonary Disease | Admitting: Pulmonary Disease

## 2012-08-31 DIAGNOSIS — Z1211 Encounter for screening for malignant neoplasm of colon: Secondary | ICD-10-CM | POA: Insufficient documentation

## 2012-08-31 NOTE — Assessment & Plan Note (Signed)
52 year old female due for initial screening colonoscopy, with need for more aggressive management of constipation. Low-volume hematochezia likely benign in the setting of constipation.  Proceed with colonoscopy with Dr. Darrick Penna in the near future. The risks, benefits, and alternatives have been discussed in detail with the patient. They state understanding and desire to proceed.  Linzess 145 mcg daily As of note, no upper GI symptoms of concern. Prilosec used prn for GERD.

## 2012-09-01 ENCOUNTER — Encounter (HOSPITAL_COMMUNITY): Payer: Self-pay | Admitting: Pharmacy Technician

## 2012-09-01 NOTE — Progress Notes (Signed)
cc'd to pcp 

## 2012-09-05 ENCOUNTER — Encounter (HOSPITAL_COMMUNITY): Payer: Medicare Other

## 2012-09-07 ENCOUNTER — Encounter (HOSPITAL_COMMUNITY): Payer: Medicare Other

## 2012-09-09 ENCOUNTER — Ambulatory Visit (HOSPITAL_COMMUNITY)
Admission: RE | Admit: 2012-09-09 | Discharge: 2012-09-09 | Disposition: A | Discharge status: Home / Self Care | Payer: Medicare Other | Source: Ambulatory Visit | Attending: Gastroenterology | Admitting: Gastroenterology

## 2012-09-09 ENCOUNTER — Encounter (HOSPITAL_COMMUNITY)
Admission: RE | Disposition: A | Discharge status: Home / Self Care | Payer: Self-pay | Source: Ambulatory Visit | Attending: Gastroenterology

## 2012-09-09 ENCOUNTER — Encounter (HOSPITAL_COMMUNITY): Payer: Self-pay | Admitting: *Deleted

## 2012-09-09 DIAGNOSIS — K621 Rectal polyp: Secondary | ICD-10-CM

## 2012-09-09 DIAGNOSIS — K5730 Diverticulosis of large intestine without perforation or abscess without bleeding: Secondary | ICD-10-CM | POA: Insufficient documentation

## 2012-09-09 DIAGNOSIS — K620 Anal polyp: Secondary | ICD-10-CM

## 2012-09-09 DIAGNOSIS — D126 Benign neoplasm of colon, unspecified: Secondary | ICD-10-CM

## 2012-09-09 DIAGNOSIS — J4489 Other specified chronic obstructive pulmonary disease: Secondary | ICD-10-CM | POA: Insufficient documentation

## 2012-09-09 DIAGNOSIS — D128 Benign neoplasm of rectum: Secondary | ICD-10-CM | POA: Insufficient documentation

## 2012-09-09 DIAGNOSIS — Z1211 Encounter for screening for malignant neoplasm of colon: Secondary | ICD-10-CM

## 2012-09-09 DIAGNOSIS — K648 Other hemorrhoids: Secondary | ICD-10-CM | POA: Insufficient documentation

## 2012-09-09 DIAGNOSIS — J449 Chronic obstructive pulmonary disease, unspecified: Secondary | ICD-10-CM | POA: Insufficient documentation

## 2012-09-09 DIAGNOSIS — K644 Residual hemorrhoidal skin tags: Secondary | ICD-10-CM | POA: Insufficient documentation

## 2012-09-09 HISTORY — PX: COLONOSCOPY: SHX5424

## 2012-09-09 HISTORY — DX: Dependence on supplemental oxygen: Z99.81

## 2012-09-09 SURGERY — COLONOSCOPY
Anesthesia: Moderate Sedation

## 2012-09-09 MED ORDER — MEPERIDINE HCL 100 MG/ML IJ SOLN
INTRAMUSCULAR | Status: AC
  Filled 2012-09-09: qty 1

## 2012-09-09 MED ORDER — MEPERIDINE HCL 50 MG/ML IJ SOLN
INTRAMUSCULAR | Status: AC
  Filled 2012-09-09: qty 1

## 2012-09-09 MED ORDER — PROMETHAZINE HCL 25 MG/ML IJ SOLN
INTRAMUSCULAR | Status: DC | PRN
  Administered 2012-09-09: 12.5 mg via INTRAVENOUS

## 2012-09-09 MED ORDER — PROMETHAZINE HCL 25 MG/ML IJ SOLN
INTRAMUSCULAR | Status: AC
  Filled 2012-09-09: qty 1

## 2012-09-09 MED ORDER — MIDAZOLAM HCL 5 MG/5ML IJ SOLN
INTRAMUSCULAR | Status: DC | PRN
  Administered 2012-09-09 (×2): 1 mg via INTRAVENOUS
  Administered 2012-09-09 (×2): 2 mg via INTRAVENOUS
  Administered 2012-09-09 (×2): 1 mg via INTRAVENOUS

## 2012-09-09 MED ORDER — HYDROCORTISONE ACE-PRAMOXINE 1-1 % RE CREA: TOPICAL_CREAM | RECTAL | Status: DC

## 2012-09-09 MED ORDER — MEPERIDINE HCL 100 MG/ML IJ SOLN
INTRAMUSCULAR | Status: DC | PRN
  Administered 2012-09-09: 25 mg via INTRAVENOUS
  Administered 2012-09-09: 50 mg via INTRAVENOUS
  Administered 2012-09-09 (×2): 25 mg via INTRAVENOUS

## 2012-09-09 MED ORDER — SIMETHICONE 40 MG/0.6ML PO SUSP
ORAL | Status: DC | PRN
  Administered 2012-09-09: 13:00:00

## 2012-09-09 MED ORDER — SODIUM CHLORIDE 0.9 % IJ SOLN
INTRAMUSCULAR | Status: AC
  Filled 2012-09-09: qty 10

## 2012-09-09 MED ORDER — MIDAZOLAM HCL 5 MG/5ML IJ SOLN
INTRAMUSCULAR | Status: AC
  Filled 2012-09-09: qty 10

## 2012-09-09 MED ORDER — SODIUM CHLORIDE 0.9 % IV SOLN
INTRAVENOUS | Status: DC
  Administered 2012-09-09: 12:00:00 via INTRAVENOUS

## 2012-09-09 NOTE — Discharge Instructions (Signed)
You had 3 small polyps removed. You have internal  7 external hemorrhoids and diverticulosis IN YOUR LEFT COLON.   USE PROCTO-CREAM FOUR TIMES A DAY FOR 10 DAYS. CALL IF SX NOT BETTER IN 7 DAYS.   DRINK WATER TO KEEP YOUR URINE LIGHT YELLOW.  FOLLOW A HIGH FIBER DIET. AVOID ITEMS THAT CAUSE BLOATING. SEE INFO BELOW.  YOUR BIOPSY RESULTS SHOULD BE BACK IN 7 DAYS.  Next colonoscopy in 10 years.   Colonoscopy Care After Read the instructions outlined below and refer to this sheet in the next week. These discharge instructions provide you with general information on caring for yourself after you leave the hospital. While your treatment has been planned according to the most current medical practices available, unavoidable complications occasionally occur. If you have any problems or questions after discharge, call DR. Somaya Grassi, [phone].  ACTIVITY  You may resume your regular activity, but move at a slower pace for the next 24 hours.   Take frequent rest periods for the next 24 hours.   Walking will help get rid of the air and reduce the bloated feeling in your belly (abdomen).   No driving for 24 hours (because of the medicine (anesthesia) used during the test).   You may shower.   Do not sign any important legal documents or operate any machinery for 24 hours (because of the anesthesia used during the test).    NUTRITION  Drink plenty of fluids.   You may resume your normal diet as instructed by your doctor.   Begin with a light meal and progress to your normal diet. Heavy or fried foods are harder to digest and may make you feel sick to your stomach (nauseated).   Avoid alcoholic beverages for 24 hours or as instructed.    MEDICATIONS  You may resume your normal medications.   WHAT YOU CAN EXPECT TODAY  Some feelings of bloating in the abdomen.   Passage of more gas than usual.   Spotting of blood in your stool or on the toilet paper  .  IF YOU HAD POLYPS  REMOVED DURING THE COLONOSCOPY:  Eat a soft diet IF YOU HAVE NAUSEA, BLOATING, ABDOMINAL PAIN, OR VOMITING.    FINDING OUT THE RESULTS OF YOUR TEST Not all test results are available during your visit. DR. Darrick Penna WILL CALL YOU WITHIN 7 DAYS OF YOUR PROCEDUE WITH YOUR RESULTS. Do not assume everything is normal if you have not heard from DR. Lugene Beougher IN ONE WEEK, CALL HER OFFICE AT [phone].  SEEK IMMEDIATE MEDICAL ATTENTION AND CALL THE OFFICE: [phone] IF:  You have more than a spotting of blood in your stool.   Your belly is swollen (abdominal distention).   You are nauseated or vomiting.   You have a temperature over 101F.   You have abdominal pain or discomfort that is severe or gets worse throughout the day.  Polyps, Colon  A polyp is extra tissue that grows inside your body. Colon polyps grow in the large intestine. The large intestine, also called the colon, is part of your digestive system. It is a long, hollow tube at the end of your digestive tract where your body makes and stores stool. Most polyps are not dangerous. They are benign. This means they are not cancerous. But over time, some types of polyps can turn into cancer. Polyps that are smaller than a pea are usually not harmful. But larger polyps could someday become or may already be cancerous. To be safe, doctors  remove all polyps and test them.   WHO GETS POLYPS? Anyone can get polyps, but certain people are more likely than others. You may have a greater chance of getting polyps if:  You are over 50.   You have had polyps before.   Someone in your family has had polyps.   Someone in your family has had cancer of the large intestine.   Find out if someone in your family has had polyps. You may also be more likely to get polyps if you:   Eat a lot of fatty foods   Smoke   Drink alcohol   Do not exercise  Eat too much   TREATMENT  The caregiver will remove the polyp during sigmoidoscopy or  colonoscopy.  PREVENTION There is not one sure way to prevent polyps. You might be able to lower your risk of getting them if you:  Eat more fruits and vegetables and less fatty food.   Do not smoke.   Avoid alcohol.   Exercise every day.   Lose weight if you are overweight.   Eating more calcium and folate can also lower your risk of getting polyps. Some foods that are rich in calcium are milk, cheese, and broccoli. Some foods that are rich in folate are chickpeas, kidney beans, and spinach.   High-Fiber Diet A high-fiber diet changes your normal diet to include more whole grains, legumes, fruits, and vegetables. Changes in the diet involve replacing refined carbohydrates with unrefined foods. The calorie level of the diet is essentially unchanged. The Dietary Reference Intake (recommended amount) for adult males is 38 grams per day. For adult females, it is 25 grams per day. Pregnant and lactating women should consume 28 grams of fiber per day. Fiber is the intact part of a plant that is not broken down during digestion. Functional fiber is fiber that has been isolated from the plant to provide a beneficial effect in the body. PURPOSE  Increase stool bulk.   Ease and regulate bowel movements.   Lower cholesterol.  INDICATIONS THAT YOU NEED MORE FIBER  Constipation and hemorrhoids.   Uncomplicated diverticulosis (intestine condition) and irritable bowel syndrome.   Weight management.   As a protective measure against hardening of the arteries (atherosclerosis), diabetes, and cancer.   GUIDELINES FOR INCREASING FIBER IN THE DIET  Start adding fiber to the diet slowly. A gradual increase of about 5 more grams (2 slices of whole-wheat bread, 2 servings of most fruits or vegetables, or 1 bowl of high-fiber cereal) per day is best. Too rapid an increase in fiber may result in constipation, flatulence, and bloating.   Drink enough water and fluids to keep your urine clear or pale  yellow. Water, juice, or caffeine-free drinks are recommended. Not drinking enough fluid may cause constipation.   Eat a variety of high-fiber foods rather than one type of fiber.   Try to increase your intake of fiber through using high-fiber foods rather than fiber pills or supplements that contain small amounts of fiber.   The goal is to change the types of food eaten. Do not supplement your present diet with high-fiber foods, but replace foods in your present diet.  INCLUDE A VARIETY OF FIBER SOURCES  Replace refined and processed grains with whole grains, canned fruits with fresh fruits, and incorporate other fiber sources. White rice, white breads, and most bakery goods contain little or no fiber.   Brown whole-grain rice, buckwheat oats, and many fruits and vegetables are all  good sources of fiber. These include: broccoli, Brussels sprouts, cabbage, cauliflower, beets, sweet potatoes, white potatoes (skin on), carrots, tomatoes, eggplant, squash, berries, fresh fruits, and dried fruits.   Cereals appear to be the richest source of fiber. Cereal fiber is found in whole grains and bran. Bran is the fiber-rich outer coat of cereal grain, which is largely removed in refining. In whole-grain cereals, the bran remains. In breakfast cereals, the largest amount of fiber is found in those with "bran" in their names. The fiber content is sometimes indicated on the label.   You may need to include additional fruits and vegetables each day.   In baking, for 1 cup white flour, you may use the following substitutions:   1 cup whole-wheat flour minus 2 tablespoons.   1/2 cup white flour plus 1/2 cup whole-wheat flour.   Diverticulosis Diverticulosis is a common condition that develops when small pouches (diverticula) form in the wall of the colon. The risk of diverticulosis increases with age. It happens more often in people who eat a low-fiber diet. Most individuals with diverticulosis have no  symptoms. Those individuals with symptoms usually experience belly (abdominal) pain, constipation, or loose stools (diarrhea).  HOME CARE INSTRUCTIONS  Increase the amount of fiber in your diet as directed by your caregiver or dietician. This may reduce symptoms of diverticulosis.   Drink at least 6 to 8 glasses of water each day to prevent constipation.   Try not to strain when you have a bowel movement.   Avoiding nuts and seeds to prevent complications is still an uncertain benefit.       FOODS HAVING HIGH FIBER CONTENT INCLUDE:  Fruits. Apple, peach, pear, tangerine, raisins, prunes.   Vegetables. Brussels sprouts, asparagus, broccoli, cabbage, carrot, cauliflower, romaine lettuce, spinach, summer squash, tomato, winter squash, zucchini.   Starchy Vegetables. Baked beans, kidney beans, lima beans, split peas, lentils, potatoes (with skin).   Grains. Whole wheat bread, brown rice, bran flake cereal, plain oatmeal, white rice, shredded wheat, bran muffins.    SEEK IMMEDIATE MEDICAL CARE IF:  You develop increasing pain or severe bloating.   You have an oral temperature above 101F.   You develop vomiting or bowel movements that are bloody or black.   Hemorrhoids Hemorrhoids are dilated (enlarged) veins around the rectum. Sometimes clots will form in the veins. This makes them swollen and painful. These are called thrombosed hemorrhoids. Causes of hemorrhoids include:  Constipation.   Straining to have a bowel movement.   HEAVY LIFTING HOME CARE INSTRUCTIONS  Eat a well balanced diet and drink 6 to 8 glasses of water every day to avoid constipation. You may also use a bulk laxative.   Avoid straining to have bowel movements.   Keep anal area dry and clean.   Do not use a donut shaped pillow or sit on the toilet for long periods. This increases blood pooling and pain.   Move your bowels when your body has the urge; this will require less straining and will decrease  pain and pressure.

## 2012-09-09 NOTE — H&P (Signed)
Primary Care Physician:  Milana Obey, MD Primary Gastroenterologist:  Dr. Darrick Penna  Pre-Procedure History & Physical: HPI:  Donna Watkins is a 52 y.o. female here for COLON CANCER SCREENING.  Past Medical History  Diagnosis Date  . Anxiety     panic attacks  . Depression   . Hyperlipidemia   . COPD (chronic obstructive pulmonary disease) 03/01/2012  . GERD (gastroesophageal reflux disease)   . Oxygen dependent     Past Surgical History  Procedure Laterality Date  . Tubal ligation      Prior to Admission medications   Medication Sig Start Date End Date Taking? Authorizing Provider  albuterol (PROVENTIL HFA;VENTOLIN HFA) 108 (90 BASE) MCG/ACT inhaler Inhale 2 puffs into the lungs every 6 (six) hours as needed. For shortness of breath   Yes Historical Provider, MD  albuterol (PROVENTIL) (2.5 MG/3ML) 0.083% nebulizer solution Take 2.5 mg by nebulization every 4 (four) hours as needed. For shortness of breath   Yes Historical Provider, MD  ALPRAZolam Prudy Feeler) 1 MG tablet Take 1 mg by mouth 4 (four) times daily as needed. For anxiety   Yes Historical Provider, MD  aspirin 81 MG tablet Take 81 mg by mouth daily.   Yes Historical Provider, MD  Fluticasone-Salmeterol (ADVAIR) 500-50 MCG/DOSE AEPB Inhale 1 puff into the lungs every 12 (twelve) hours.   Yes Historical Provider, MD  ipratropium (ATROVENT) 0.02 % nebulizer solution Take 500 mcg by nebulization 4 (four) times daily as needed. For shortness of breath   Yes Historical Provider, MD  Linaclotide (LINZESS) 145 MCG CAPS capsule Take 1 capsule (145 mcg total) by mouth daily. Take 30 minutes before breakfast daily for constipation. 08/30/12  Yes Nira Retort, NP  omeprazole (PRILOSEC) 20 MG capsule Take 20 mg by mouth daily.   Yes Historical Provider, MD  polyethylene glycol-electrolytes (TRILYTE) 420 G solution Take 4,000 mLs by mouth as directed. 08/30/12  Yes West Bali, MD  fenofibrate micronized (LOFIBRA) 134 MG capsule  Take 134 mg by mouth daily before breakfast.    Historical Provider, MD    Allergies as of 08/30/2012  . (No Known Allergies)    Family History  Problem Relation Age of Onset  . COPD Mother   . Colon cancer Neg Hx     History   Social History  . Marital Status: Divorced    Spouse Name: N/A    Number of Children: N/A  . Years of Education: N/A   Occupational History  . Not on file.   Social History Main Topics  . Smoking status: Former Smoker -- 2.50 packs/day for 30 years    Types: Cigarettes    Quit date: 06/12/2011  . Smokeless tobacco: Never Used  . Alcohol Use: No  . Drug Use: No  . Sexual Activity: Not on file   Other Topics Concern  . Not on file   Social History Narrative  . No narrative on file    Review of Systems: See HPI, otherwise negative ROS   Physical Exam: BP 110/74  Pulse 85  Temp(Src) 97.7 F (36.5 C) (Oral)  Resp 26  Ht 5\' 3"  (1.6 m)  Wt 187 lb (84.823 kg)  BMI 33.13 kg/m2  SpO2 92%  LMP 05/06/2010 General:   Alert,  pleasant and cooperative in NAD Head:  Normocephalic and atraumatic. Neck:  Supple; Lungs:  Clear throughout to auscultation.    Heart:  Regular rate and rhythm. Abdomen:  Soft, nontender and nondistended. Normal bowel sounds, without guarding,  and without rebound.   Neurologic:  Alert and  oriented x4;  grossly normal neurologically.  Impression/Plan:     SCREENING  Plan:  1. TCS TODAY

## 2012-09-10 NOTE — Op Note (Signed)
[facility] [address] [place], [address]   COLONOSCOPY PROCEDURE REPORT  PATIENT: Donna Watkins, Donna Watkins  MR#: [id number] BIRTHDATE: 12-15-1960 , 51  yrs. old GENDER: Female ENDOSCOPIST: Jonette Eva, MD REFERRED XB:JYNWG Sudie Bailey, M.D. PROCEDURE DATE:  09/09/2012 PROCEDURE:   Colonoscopy with cold biopsy polypectomy INDICATIONS:Average risk patient for colon cancer. MEDICATIONS: Demerol 125 mg IV, Versed 8 mg IV, and Promethazine (Phenergan) 12.5mg  IV  DESCRIPTION OF PROCEDURE:    Physical exam was performed.  Informed consent was obtained from the patient after explaining the benefits, risks, and alternatives to procedure.  The patient was connected to monitor and placed in left lateral position. Continuous oxygen was provided by nasal cannula and IV medicine administered through an indwelling cannula.  After administration of sedation and rectal exam, the patients rectum was intubated and the EC-[id number] ([id number])  colonoscope was advanced under direct visualization to the ileum.  The scope was removed slowly by carefully examining the color, texture, anatomy, and integrity mucosa on the way out.  The patient was recovered in endoscopy and discharged home in satisfactory condition.     COLON FINDINGS: Three sessile polyps measuring 2-4 mm in size were found in the sigmoid colon and rectum.  A polypectomy was performed with cold forceps.  , Mild diverticulosis was noted in the sigmoid colon.  , and Moderate sized internal and LARGE external hemorrhoids were found.  PREP QUALITY: good.    CECAL W/D TIME: 13 minutes     COMPLICATIONS: None  ENDOSCOPIC IMPRESSION: Three colon polyps removed MILD SIGMOID COLON DIVERTICULOSIS MODERATE INTERNAL HEMORRHOID LARGE EXTERNAL HEMORRHOIDS  RECOMMENDATIONS: USE PROCTO-CREAM FOUR TIMES A DAY FOR 10 DAYS.  CALL IF SX NOT BETTER IN 7 DAYS. DRINK WATER TO KEEP YOUR URINE LIGHT YELLOW. FOLLOW A HIGH FIBER DIET.   AVOID ITEMS THAT CAUSE BLOATING. BIOPSY RESULTS SHOULD BE BACK IN 7 DAYS. OPV OCT 1 FOR [facility] BANDING Next colonoscopy in 10 years.       _______________________________ Rosalie DoctorJonette Eva, MD 09/10/2012 7:38 AM

## 2012-09-12 ENCOUNTER — Encounter (HOSPITAL_COMMUNITY): Payer: Medicare Other

## 2012-09-12 LAB — CBC
HCT: 40 %
HGB: 12.9 g/dL
platelet count: 135

## 2012-09-13 ENCOUNTER — Encounter (HOSPITAL_COMMUNITY): Payer: Self-pay | Admitting: Gastroenterology

## 2012-09-14 ENCOUNTER — Encounter (HOSPITAL_COMMUNITY): Payer: Medicare Other

## 2012-09-15 ENCOUNTER — Telehealth: Payer: Self-pay | Admitting: Gastroenterology

## 2012-09-15 NOTE — Telephone Encounter (Signed)
Please call pt. She had a THREE polypoid lesions, removed and THEY ARE benign. SHE SHOULD:  USE PROCTO-CREAM FOUR TIMES A DAY FOR 10 DAYS.  DRINK WATER TO KEEP YOUR URINE LIGHT YELLOW. FOLLOW A HIGH FIBER DIET. AVOID ITEMS THAT CAUSE BLOATING.  Next colonoscopy in 10 years.

## 2012-09-16 NOTE — Telephone Encounter (Signed)
Tried to call with no answer  

## 2012-09-19 ENCOUNTER — Encounter (HOSPITAL_COMMUNITY): Payer: Medicare Other

## 2012-09-19 ENCOUNTER — Telehealth: Payer: Self-pay | Admitting: *Deleted

## 2012-09-19 NOTE — Telephone Encounter (Signed)
Error

## 2012-09-19 NOTE — Telephone Encounter (Signed)
LMOM to call.

## 2012-09-19 NOTE — Telephone Encounter (Signed)
Pt returned call and was informed.  

## 2012-09-21 ENCOUNTER — Encounter (HOSPITAL_COMMUNITY): Payer: Medicare Other

## 2012-09-26 ENCOUNTER — Encounter (HOSPITAL_COMMUNITY): Payer: Medicare Other

## 2012-09-27 ENCOUNTER — Encounter: Payer: Medicare Other | Admitting: Gastroenterology

## 2012-09-28 ENCOUNTER — Encounter (HOSPITAL_COMMUNITY): Payer: Medicare Other

## 2012-09-30 ENCOUNTER — Telehealth: Payer: Self-pay | Admitting: Pulmonary Disease

## 2012-09-30 NOTE — Telephone Encounter (Signed)
Pt requests late afternoon OV and CT Chest appointments. Pt understands she will get a call first part of next week regarding this matter.

## 2012-09-30 NOTE — Telephone Encounter (Signed)
CT scan is already ordered in [facility]. Please advise PCC's thanks

## 2012-10-03 ENCOUNTER — Telehealth: Payer: Self-pay | Admitting: Pulmonary Disease

## 2012-10-03 ENCOUNTER — Encounter (HOSPITAL_COMMUNITY): Payer: Medicare Other

## 2012-10-03 NOTE — Telephone Encounter (Signed)
Dr. Vassie Loll you have consult slot on hold for 11/03/12. Please advise if okay to use one slot for pt? thanks

## 2012-10-03 NOTE — Telephone Encounter (Signed)
lmomtcb Sally E Ottinger ° °

## 2012-10-03 NOTE — Telephone Encounter (Signed)
appt for chest ct @[facility] 10/26/12@4 :00pm pt is aware Donna Watkins

## 2012-10-04 NOTE — Telephone Encounter (Signed)
Per RA don't use the block spots. I called pt and appt scheduled.

## 2012-10-04 NOTE — Telephone Encounter (Signed)
lmtcb x1 for pt. 

## 2012-10-04 NOTE — Telephone Encounter (Signed)
Make routine fu appt

## 2012-10-04 NOTE — Telephone Encounter (Signed)
Mindy, It was unclear to me if RA was okay with using a blocked spot for this pt Please advise and I will be happy to call her, thanks

## 2012-10-04 NOTE — Telephone Encounter (Signed)
Pt returned call. Donna Watkins °

## 2012-10-05 ENCOUNTER — Encounter (HOSPITAL_COMMUNITY): Payer: Medicare Other

## 2012-10-26 ENCOUNTER — Ambulatory Visit (HOSPITAL_COMMUNITY)
Admission: RE | Admit: 2012-10-26 | Discharge: 2012-10-26 | Disposition: A | Discharge status: Home / Self Care | Payer: Medicare Other | Source: Ambulatory Visit | Attending: Pulmonary Disease | Admitting: Pulmonary Disease

## 2012-10-26 DIAGNOSIS — I10 Essential (primary) hypertension: Secondary | ICD-10-CM | POA: Insufficient documentation

## 2012-10-26 DIAGNOSIS — J449 Chronic obstructive pulmonary disease, unspecified: Secondary | ICD-10-CM | POA: Insufficient documentation

## 2012-10-26 DIAGNOSIS — J4489 Other specified chronic obstructive pulmonary disease: Secondary | ICD-10-CM | POA: Insufficient documentation

## 2012-10-26 DIAGNOSIS — R0602 Shortness of breath: Secondary | ICD-10-CM | POA: Insufficient documentation

## 2012-10-26 DIAGNOSIS — R911 Solitary pulmonary nodule: Secondary | ICD-10-CM | POA: Insufficient documentation

## 2012-10-26 DIAGNOSIS — E119 Type 2 diabetes mellitus without complications: Secondary | ICD-10-CM | POA: Insufficient documentation

## 2012-10-27 ENCOUNTER — Telehealth: Payer: Self-pay | Admitting: Pulmonary Disease

## 2012-10-27 NOTE — Telephone Encounter (Signed)
Result Note    Slight decrease in size of nodule     I spoke with patient about results and she verbalized understanding and had no questions

## 2012-11-04 ENCOUNTER — Ambulatory Visit: Payer: Medicare Other | Admitting: Pulmonary Disease

## 2012-11-28 ENCOUNTER — Ambulatory Visit (INDEPENDENT_AMBULATORY_CARE_PROVIDER_SITE_OTHER): Payer: Medicare Other | Admitting: Pulmonary Disease

## 2012-11-28 ENCOUNTER — Encounter: Payer: Self-pay | Admitting: Pulmonary Disease

## 2012-11-28 VITALS — BP 118/66 | HR 86 | Ht 63.0 in | Wt 180.4 lb

## 2012-11-28 DIAGNOSIS — J449 Chronic obstructive pulmonary disease, unspecified: Secondary | ICD-10-CM

## 2012-11-28 DIAGNOSIS — R911 Solitary pulmonary nodule: Secondary | ICD-10-CM

## 2012-11-28 DIAGNOSIS — J4489 Other specified chronic obstructive pulmonary disease: Secondary | ICD-10-CM

## 2012-11-28 NOTE — Patient Instructions (Signed)
Advair 250/50 ok - sample Stay on nebs

## 2012-11-28 NOTE — Progress Notes (Signed)
  Subjective:    Patient ID: Donna Watkins, female    DOB: 1960/09/17, 52 y.o.   MRN: [id number]  HPI  PCP - Sudie Bailey   52/F for FU of COPD on home O2.  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  She was admitted with weight loss, headache & worsening DOE x months & hypoxia.  She required bipap in the ED for ABG of 7.37/79/63 on 6L [place]  ABG on 6/11 was 7.42/60/61/90%  MRA head was neg for [facility].  CT angio was neg for PE, showed Emphysematous changes and biapical scarring. Biapical nodular  opacities also likely reflect sequelae of scarring  CT abdomen was neg.  She was treated with levaquin, steroids & dc'd home with home oxygen arranged at 5 L per minute.   CT chest 12/13 LUL nodule resolved compared to 6/13  4mm RUL nodule persists  Quit smoking 6/13     11/28/2012 33m FU -Accompanied by sister  PFTs 5/14 showed severe COPD with fev1 only 26%, diffusion at 52% ABG on 2L/m oximiser was OK - stay on this level at rest - she may require more during exercise which rehab will adjust  Completed pulm rehab Advair too expensive $185 -in doughnut hole - PCP increased to 500/50 Ct chest >>Reduction in size of the Rt upper lobe pulmonary nodule over the past 10 months.  Cylindrical bronchiectasis in both lower lobes.   Review of Systems neg for any significant sore throat, dysphagia, itching, sneezing, nasal congestion or excess/ purulent secretions, fever, chills, sweats, unintended wt loss, pleuritic or exertional cp, hempoptysis, orthopnea pnd or change in chronic leg swelling. Also denies presyncope, palpitations, heartburn, abdominal pain, nausea, vomiting, diarrhea or change in bowel or urinary habits, dysuria,hematuria, rash, arthralgias, visual complaints, headache, numbness weakness or ataxia.      Objective:   Physical Exam  Gen. Pleasant, well-nourished, in no distress ENT - no lesions, no post nasal drip Neck: No JVD, no thyromegaly, no  carotid bruits Lungs: no use of accessory muscles, no dullness to percussion, clear without rales or rhonchi  Cardiovascular: Rhythm regular, heart sounds  normal, no murmurs or gallops, no peripheral edema Musculoskeletal: No deformities, no cyanosis or clubbing        Assessment & Plan:

## 2012-11-29 NOTE — Assessment & Plan Note (Signed)
Reduced size of RUL on CT 11/14 makes malignancy unlikely FU CT in 1 yr for completion

## 2012-11-29 NOTE — Assessment & Plan Note (Signed)
Advair 250/50 ok - sample Stay on nebs  

## 2012-12-22 NOTE — Progress Notes (Signed)
Pulmonary Rehabilitation Program Outcomes Report   Orientation:  04/21/2012 !st week report: 05/09/2012 Graduate Date:  tbd  Discharge Date:  tbd # of sessions completed: 3  DX: COPD  Pulmonologist: Vassie Loll Family MD:  Ellene Route Time:  13:00  A.  Exercise Program:  Tolerates exercise @ 3.81 METS for 15 minutes and Walk Test Results:  Pre: Pre walk Test: Resting HR 72, BP 100/60, O2 96%, RPE 7, and RPD 8. 6 minute HR 81, BP 90/60, O2 93%, Rpe 12, and RPD 15, Post HR 71, BP 112/70 O2 99% RPE 7 and RPD 8. Walked 950 ft at 1.79 mph at 2.38 METS.  B.  Mental Health:  Good mental attitude and Quality of Life (QOL)  improvements:  Overall  21.00 %, Health/Functioning 21.00 %, Socioeconomics 21.00 %, Psych/Spiritual 21.00 %, Family 21.00 %    C.  Education/Instruction/Skills  Accurately checks own pulse.  Rest:  78  Exercise:  88, Knows THR for exercise and Uses Perceived Exertion Scale and/or Dyspnea Scale  Uses Perceived Exertion Scale and/or Dyspnea Scale  D.  Nutrition/Weight Control/Body Composition:  Adherence to prescribed nutrition program: good    E.  Blood Lipids    No results found for this basename: CHOL, HDL, LDLCALC, LDLDIRECT, TRIG, CHOLHDL    F.  Lifestyle Changes:  Making positive lifestyle changes and Not smoking:  Quit 2013  G.  Symptoms noted with exercise:  Asymptomatic  Report Completed By:  Lelon Huh. Jacorey Donaway RN   Comments:  This is patients 1st week report. She has done well in pulmonary rehab. She achieved a peak METS of 3.81. Her resting Hr was 78 and her resting BP was 110/60, Peak HR was 88 and peak BP was 130/62. A halfway report will follow upon her 12 visit.

## 2012-12-22 NOTE — Progress Notes (Signed)
Pulmonary Rehabilitation Program Outcomes Report   Orientation:  04/21/2012 Halfway report: 08/22/2012 Graduate Date:  tbd Discharge Date:  tbd # of sessions completed: 12 DX: COPD  Pulmonologist: Vassie Loll Family MD:  Ellene Route Time:  13:00  A.  Exercise Program:  Tolerates exercise @ 3.81 METS for 15 minutes  B.  Mental Health:  Good mental attitude  C.  Education/Instruction/Skills  Accurately checks own pulse.  Rest:  80  Exercise: 87, Knows THR for exercise and Uses Perceived Exertion Scale and/or Dyspnea Scale  Uses Perceived Exertion Scale and/or Dyspnea Scale  D.  Nutrition/Weight Control/Body Composition:  Adherence to prescribed nutrition program: good    E.  Blood Lipids    No results found for this basename: CHOL, HDL, LDLCALC, LDLDIRECT, TRIG, CHOLHDL    F.  Lifestyle Changes:  Making positive lifestyle changes and Not smoking:  Quit 2013  G.  Symptoms noted with exercise:  Asymptomatic  Report Completed By:  Lelon Huh. Lesly Joslyn RN   Comments:  This is patients halfway report. She has completed 12 sessions. She achieved a peak METS of 3.81. Her resting Hr was 80 and resting BP was 90/60, Her peak HR was 87 and peak BP was 100/62. A graduation report will follow upon her 24 th visit.

## 2012-12-22 NOTE — Addendum Note (Signed)
Encounter addended by: Angelica Pou, RN on: 12/22/2012  7:36 AM<BR>     Documentation filed: Notes Section

## 2012-12-22 NOTE — Progress Notes (Signed)
Pulmonary Rehabilitation Program Outcomes Report   Orientation:  04/21/2012 Graduate Date:  NA Discharge Date:  08/31/2012 # of sessions completed: 22 of 24 sessions DX: COPD  Pulmonologist: Vassie Loll Family MD:  Ellene Route Time:  13:00  A.  Exercise Program:  Tolerates exercise @ 3.80 METS for 15 minutes, Walk Test Results:  Post: No post walk test performed patient did not finish program. and Discharged  B.  Mental Health:  Good mental attitude  C.  Education/Instruction/Skills  Accurately checks own pulse.  Rest:  73  Exercise:  108, Knows THR for exercise, Uses Perceived Exertion Scale and/or Dyspnea Scale and Attended 11education classes  Uses Perceived Exertion Scale and/or Dyspnea Scale  D.  Nutrition/Weight Control/Body Composition:  Adherence to prescribed nutrition program: good    E.  Blood Lipids    No results found for this basename: CHOL, HDL, LDLCALC, LDLDIRECT, TRIG, CHOLHDL    F.  Lifestyle Changes:  Making positive lifestyle changes and Not smoking:  Quit 2013  G.  Symptoms noted with exercise:  Asymptomatic  Report Completed By:  Lelon Huh. Dallan Schonberg RN   Comments:  This is patients discharge report. She attended 22 out of 24 sessions. She went out sick and never returned to finish her 2 sessions. She did well while in rehab. Her resting HR was 73 and her resting BP was 120/62and her peak HR was 108 and peak BP was 118/60. A call will be made on patients 1 month , 6 month, and 1 year as a follow up.

## 2012-12-22 NOTE — Addendum Note (Signed)
Encounter addended by: Angelica Pou, RN on: 12/22/2012  7:19 AM<BR>     Documentation filed: Notes Section

## 2012-12-22 NOTE — Addendum Note (Signed)
Encounter addended by: Angelica Pou, RN on: 12/22/2012  7:37 AM<BR>     Documentation filed: Clinical Notes

## 2012-12-22 NOTE — Addendum Note (Signed)
Encounter addended by: Angelica Pou, RN on: 12/22/2012  7:20 AM<BR>     Documentation filed: Clinical Notes

## 2012-12-22 NOTE — Addendum Note (Signed)
Encounter addended by: Angelica Pou, RN on: 12/22/2012  7:50 AM<BR>     Documentation filed: Notes Section

## 2012-12-22 NOTE — Addendum Note (Signed)
Encounter addended by: Angelica Pou, RN on: 12/22/2012  7:51 AM<BR>     Documentation filed: Clinical Notes

## 2013-01-24 ENCOUNTER — Telehealth: Payer: Self-pay | Admitting: Pulmonary Disease

## 2013-01-24 MED ORDER — FLUTICASONE-SALMETEROL 500-50 MCG/DOSE IN AEPB: 1.0000 | INHALATION_SPRAY | Freq: Two times a day (BID) | RESPIRATORY_TRACT | Status: DC

## 2013-01-24 NOTE — Telephone Encounter (Signed)
Spoke with pt. Aware RX has been sent. Nothing further needed

## 2013-02-07 ENCOUNTER — Telehealth: Payer: Self-pay | Admitting: Pulmonary Disease

## 2013-02-07 DIAGNOSIS — J449 Chronic obstructive pulmonary disease, unspecified: Secondary | ICD-10-CM

## 2013-02-07 NOTE — Telephone Encounter (Signed)
Order has been placed. Pt is aware.

## 2013-02-13 DIAGNOSIS — J4489 Other specified chronic obstructive pulmonary disease: Secondary | ICD-10-CM | POA: Diagnosis not present

## 2013-03-28 NOTE — Progress Notes (Signed)
REVIEWED. TCS SEP 2014 POLYPOID LESIONS

## 2013-03-30 ENCOUNTER — Ambulatory Visit: Payer: Medicare Other | Admitting: Adult Health

## 2013-04-03 ENCOUNTER — Ambulatory Visit: Payer: Medicare Other | Admitting: Adult Health

## 2013-04-17 ENCOUNTER — Ambulatory Visit: Payer: Medicare Other | Admitting: Adult Health

## 2013-05-23 ENCOUNTER — Ambulatory Visit (INDEPENDENT_AMBULATORY_CARE_PROVIDER_SITE_OTHER): Payer: Medicare Other | Admitting: Adult Health

## 2013-05-23 ENCOUNTER — Encounter: Payer: Self-pay | Admitting: Adult Health

## 2013-05-23 VITALS — BP 108/58 | HR 68 | Temp 98.1°F | Ht 63.0 in | Wt 180.8 lb

## 2013-05-23 DIAGNOSIS — J449 Chronic obstructive pulmonary disease, unspecified: Secondary | ICD-10-CM

## 2013-05-23 DIAGNOSIS — R911 Solitary pulmonary nodule: Secondary | ICD-10-CM

## 2013-05-23 DIAGNOSIS — J4489 Other specified chronic obstructive pulmonary disease: Secondary | ICD-10-CM

## 2013-05-23 MED ORDER — IPRATROPIUM BROMIDE 0.02 % IN SOLN: 500.0000 ug | Freq: Four times a day (QID) | RESPIRATORY_TRACT | Status: DC | PRN

## 2013-05-23 NOTE — Assessment & Plan Note (Signed)
Compensated without exacerbation  Plan  Continue on Advair Twice daily  .  Continue on Ipratropium and Albuterol every 4 hr as needed.  Call if you run into "Doughnut hole " and we can look at using nebs only .  Follow up Dr. Elsworth Soho  In 4 months and As needed

## 2013-05-23 NOTE — Addendum Note (Signed)
Addended by: Parke Poisson E on: 05/23/2013 05:23 PM   Modules accepted: Orders

## 2013-05-23 NOTE — Patient Instructions (Addendum)
Continue on Advair Twice daily  .  Continue on Ipratropium and Albuterol every 4 hr as needed.  Call if you run into "Doughnut hole " and we can look at using nebs only .  Follow up Dr. Elsworth Soho  In 4 months and As needed   CT chest prior to next office visit.

## 2013-05-23 NOTE — Assessment & Plan Note (Signed)
Plan for repeat CT chest in 10/2012  Ov with Dr. Elsworth Soho  To discuss  .

## 2013-05-23 NOTE — Addendum Note (Signed)
Addended by: Elie Confer on: 05/23/2013 04:49 PM   Modules accepted: Orders

## 2013-05-23 NOTE — Progress Notes (Signed)
  Subjective:    Patient ID: Donna Watkins, female    DOB: February 09, 1960, 53 y.o.   MRN: [id number]  HPI   PCP - Karie Kirks   53/F for FU of COPD on home O2.  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  She was admitted with weight loss, headache & worsening DOE x months & hypoxia.  She required bipap in the ED for ABG of 7.37/79/63 on 6L [place]  ABG on 6/11 was 7.42/60/61/90%  MRA head was neg for [facility].  CT angio was neg for PE, showed Emphysematous changes and biapical scarring. Biapical nodular  opacities also likely reflect sequelae of scarring  CT abdomen was neg.  She was treated with levaquin, steroids & dc'd home with home oxygen arranged at 5 L per minute.   CT chest 12/13 LUL nodule resolved compared to 6/13  56mm RUL nodule persists  Quit smoking 6/13   11/28/13 20m FU -Accompanied by sister  PFTs 5/14 showed severe COPD with fev1 only 26%, diffusion at 52% ABG on 2L/m oximiser was OK - stay on this level at rest - she may require more during exercise which rehab will adjust  Completed pulm rehab Advair too expensive $185 -in doughnut hole - PCP increased to 500/50 Ct chest >>Reduction in size of the Rt upper lobe pulmonary nodule over the past 10 months.  Cylindrical bronchiectasis in both lower lobes. >>no changes   05/23/2013 Follow up for COPD  Returns for 6 month follow up for COPD  Her breathing has been, at baseline. Does notice that when the weather changes, that she does have occasional cough, and wheezing intermittently. She denies any flare of cough, shortness, of breath. No fever or hemoptysis, orthopnea, PND, or increased leg swelling. We did discuss the cost of her medications as she runs into a doughnut hole. At times with her medication We discussed using nebulizer. Medications only. However, she was to wait at this time  Review of Systems  neg for any significant sore throat, dysphagia, itching, sneezing, nasal congestion  or excess/ purulent secretions, fever, chills, sweats, unintended wt loss, pleuritic or exertional cp, hempoptysis, orthopnea pnd or change in chronic leg swelling. Also denies presyncope, palpitations, heartburn, abdominal pain, nausea, vomiting, diarrhea or change in bowel or urinary habits, dysuria,hematuria, rash, arthralgias, visual complaints, headache, numbness weakness or ataxia.      Objective:   Physical Exam   Gen. Pleasant, in no distress ENT - no lesions, no post nasal drip Neck: No JVD, no thyromegaly, no carotid bruits Lungs: no use of accessory muscles, no dullness to percussion, clear without rales or rhonchi  Cardiovascular: Rhythm regular, heart sounds  normal, no murmurs or gallops, no peripheral edema Musculoskeletal: No deformities, no cyanosis or clubbing        Assessment & Plan:

## 2013-06-15 ENCOUNTER — Other Ambulatory Visit (HOSPITAL_COMMUNITY): Payer: Self-pay | Admitting: Family Medicine

## 2013-06-15 DIAGNOSIS — Z1231 Encounter for screening mammogram for malignant neoplasm of breast: Secondary | ICD-10-CM

## 2013-07-17 ENCOUNTER — Ambulatory Visit (HOSPITAL_COMMUNITY)
Admission: RE | Admit: 2013-07-17 | Discharge: 2013-07-17 | Disposition: A | Discharge status: Home / Self Care | Payer: Medicare Other | Source: Ambulatory Visit | Attending: Family Medicine | Admitting: Family Medicine

## 2013-07-17 DIAGNOSIS — Z1231 Encounter for screening mammogram for malignant neoplasm of breast: Secondary | ICD-10-CM | POA: Insufficient documentation

## 2013-08-21 ENCOUNTER — Telehealth: Payer: Self-pay | Admitting: Pulmonary Disease

## 2013-08-21 NOTE — Telephone Encounter (Signed)
Called spoke with pt. She can no longer afford her advair bc she is in the donut hole. She has to meet her deductible first. Per last OV with TP:  Patient Instructions      Continue on Advair Twice daily  .   Continue on Ipratropium and Albuterol every 4 hr as needed.   Call if you run into "Doughnut hole " and we can look at using nebs only .   Follow up Dr. Elsworth Soho  In 4 months and As needed    CT chest prior to next office visit   Please advise RA thanks

## 2013-08-22 NOTE — Telephone Encounter (Signed)
Pt called back and she is aware of RA recs to stop the advair and use the nebulizer meds TID   Pt voiced her understanding and nothing further is needed.

## 2013-08-22 NOTE — Telephone Encounter (Signed)
[email or web address].Derrion Tritz, CMA  

## 2013-08-22 NOTE — Telephone Encounter (Signed)
Ok to stop & use nebs (albut + atrovent)  tid

## 2013-09-25 ENCOUNTER — Telehealth: Payer: Self-pay | Admitting: Pulmonary Disease

## 2013-09-25 NOTE — Telephone Encounter (Signed)
ches [email or web address] 09/27/13@4 :00 pt aware Joellen Jersey

## 2013-09-25 NOTE — Telephone Encounter (Signed)
CT order is already in epic. Please advise PCC;s thanks

## 2013-09-27 ENCOUNTER — Ambulatory Visit (HOSPITAL_COMMUNITY): Admission: RE | Admit: 2013-09-27 | Payer: Medicare Other | Source: Ambulatory Visit

## 2013-09-27 ENCOUNTER — Telehealth: Payer: Self-pay | Admitting: Adult Health

## 2013-09-27 NOTE — Telephone Encounter (Signed)
CT rescheduled to 10.26.15 Will sign off

## 2013-09-27 NOTE — Telephone Encounter (Signed)
Pt scheduled for serial CT this afternoon at [facility] at 4pm Last CT was 10.22.14, insurance will not pay for next CT until after this date Per the 5.19.15 ov w/ TP, pt was to have CT prior to October visit with RA but pt thought this was to be done in Sept  Discussed with TP, it is okay for the CT to be done when insurance will pay for it  Called spoke with [facility] and advised okay for CT to be done in Oct.  She will call the patient to reschedule. ATC pt to discuss with her as well >> LMOM TCB x1.

## 2013-10-02 ENCOUNTER — Telehealth: Payer: Self-pay | Admitting: Pulmonary Disease

## 2013-10-02 NOTE — Telephone Encounter (Signed)
Error just needed to know new appointment date.Donna Watkins

## 2013-10-30 ENCOUNTER — Ambulatory Visit (HOSPITAL_COMMUNITY): Payer: Medicare Other

## 2013-11-01 ENCOUNTER — Telehealth: Payer: Self-pay | Admitting: Pulmonary Disease

## 2013-11-01 NOTE — Telephone Encounter (Signed)
Spoke with patient-she is aware that our PCC's will assist tomorrow in getting her CT scan [facility]. Pt would like to have any day appt but around 4:30pm if possible. Thanks.

## 2013-11-02 NOTE — Telephone Encounter (Signed)
LMTCB Sally E Ottinger ° °

## 2013-11-02 NOTE — Telephone Encounter (Signed)
Ct rescheduled to 11/09/13@4 :30pm Donna Watkins

## 2013-11-02 NOTE — Telephone Encounter (Signed)
Pt returned my call and is aware of this appt date and time Donna Watkins

## 2013-11-09 ENCOUNTER — Ambulatory Visit (HOSPITAL_COMMUNITY)
Admission: RE | Admit: 2013-11-09 | Discharge: 2013-11-09 | Disposition: A | Discharge status: Home / Self Care | Payer: Medicare Other | Source: Ambulatory Visit | Attending: Adult Health | Admitting: Adult Health

## 2013-11-09 DIAGNOSIS — R911 Solitary pulmonary nodule: Secondary | ICD-10-CM

## 2013-11-10 ENCOUNTER — Ambulatory Visit (HOSPITAL_COMMUNITY): Payer: Medicare Other

## 2013-11-11 ENCOUNTER — Other Ambulatory Visit: Payer: Self-pay | Admitting: Adult Health

## 2013-11-11 ENCOUNTER — Inpatient Hospital Stay (HOSPITAL_COMMUNITY): Admission: RE | Admit: 2013-11-11 | Payer: Medicare Other | Source: Ambulatory Visit

## 2013-11-11 DIAGNOSIS — R911 Solitary pulmonary nodule: Secondary | ICD-10-CM

## 2013-11-13 ENCOUNTER — Ambulatory Visit (HOSPITAL_COMMUNITY): Admission: RE | Admit: 2013-11-13 | Payer: Medicare Other | Source: Ambulatory Visit

## 2013-11-15 ENCOUNTER — Ambulatory Visit (HOSPITAL_COMMUNITY)
Admission: RE | Admit: 2013-11-15 | Discharge: 2013-11-15 | Disposition: A | Discharge status: Home / Self Care | Payer: Medicare Other | Source: Ambulatory Visit | Attending: Adult Health | Admitting: Adult Health

## 2013-11-15 DIAGNOSIS — Z09 Encounter for follow-up examination after completed treatment for conditions other than malignant neoplasm: Secondary | ICD-10-CM | POA: Insufficient documentation

## 2013-11-15 DIAGNOSIS — J449 Chronic obstructive pulmonary disease, unspecified: Secondary | ICD-10-CM | POA: Diagnosis not present

## 2013-11-15 DIAGNOSIS — R911 Solitary pulmonary nodule: Secondary | ICD-10-CM | POA: Insufficient documentation

## 2013-11-15 DIAGNOSIS — J479 Bronchiectasis, uncomplicated: Secondary | ICD-10-CM | POA: Diagnosis not present

## 2013-11-15 DIAGNOSIS — Z23 Encounter for immunization: Secondary | ICD-10-CM | POA: Diagnosis not present

## 2013-11-15 DIAGNOSIS — G4700 Insomnia, unspecified: Secondary | ICD-10-CM | POA: Diagnosis not present

## 2013-11-15 DIAGNOSIS — F410 Panic disorder [episodic paroxysmal anxiety] without agoraphobia: Secondary | ICD-10-CM | POA: Diagnosis not present

## 2014-01-22 ENCOUNTER — Emergency Department (HOSPITAL_COMMUNITY)
Admission: EM | Admit: 2014-01-22 | Discharge: 2014-01-22 | Disposition: A | Discharge status: Home / Self Care | Payer: Medicare Other | Attending: Emergency Medicine | Admitting: Emergency Medicine

## 2014-01-22 ENCOUNTER — Emergency Department (HOSPITAL_COMMUNITY): Payer: Medicare Other

## 2014-01-22 ENCOUNTER — Encounter (HOSPITAL_COMMUNITY): Payer: Self-pay | Admitting: Emergency Medicine

## 2014-01-22 DIAGNOSIS — J449 Chronic obstructive pulmonary disease, unspecified: Secondary | ICD-10-CM | POA: Insufficient documentation

## 2014-01-22 DIAGNOSIS — F410 Panic disorder [episodic paroxysmal anxiety] without agoraphobia: Secondary | ICD-10-CM | POA: Insufficient documentation

## 2014-01-22 DIAGNOSIS — Z9981 Dependence on supplemental oxygen: Secondary | ICD-10-CM | POA: Insufficient documentation

## 2014-01-22 DIAGNOSIS — R05 Cough: Secondary | ICD-10-CM | POA: Diagnosis not present

## 2014-01-22 DIAGNOSIS — F329 Major depressive disorder, single episode, unspecified: Secondary | ICD-10-CM | POA: Insufficient documentation

## 2014-01-22 DIAGNOSIS — K219 Gastro-esophageal reflux disease without esophagitis: Secondary | ICD-10-CM | POA: Diagnosis not present

## 2014-01-22 DIAGNOSIS — Z79899 Other long term (current) drug therapy: Secondary | ICD-10-CM | POA: Insufficient documentation

## 2014-01-22 DIAGNOSIS — Z7982 Long term (current) use of aspirin: Secondary | ICD-10-CM | POA: Diagnosis not present

## 2014-01-22 DIAGNOSIS — R059 Cough, unspecified: Secondary | ICD-10-CM

## 2014-01-22 DIAGNOSIS — R0989 Other specified symptoms and signs involving the circulatory and respiratory systems: Secondary | ICD-10-CM | POA: Insufficient documentation

## 2014-01-22 DIAGNOSIS — R0981 Nasal congestion: Secondary | ICD-10-CM | POA: Diagnosis present

## 2014-01-22 MED ORDER — BENZONATATE 100 MG PO CAPS: 100.0000 mg | ORAL_CAPSULE | Freq: Three times a day (TID) | ORAL | Status: DC | PRN

## 2014-01-22 MED ORDER — HYDROCOD POLST-CHLORPHEN POLST 10-8 MG/5ML PO LQCR
5.0000 mL | Freq: Once | ORAL | Status: AC
  Administered 2014-01-22: 5 mL via ORAL
  Filled 2014-01-22: qty 5

## 2014-01-22 MED ORDER — HYDROXYZINE HCL 25 MG PO TABS: 25.0000 mg | ORAL_TABLET | Freq: Every evening | ORAL | Status: DC | PRN

## 2014-01-22 MED ORDER — AZITHROMYCIN 500 MG PO TABS: 500.0000 mg | ORAL_TABLET | Freq: Every day | ORAL | Status: DC

## 2014-01-22 MED ORDER — AZITHROMYCIN 250 MG PO TABS
500.0000 mg | ORAL_TABLET | Freq: Once | ORAL | Status: AC
  Administered 2014-01-22: 500 mg via ORAL
  Filled 2014-01-22: qty 2

## 2014-01-22 NOTE — Discharge Instructions (Signed)
Cough, Adult   A cough is a reflex. It helps you clear your throat and airways. A cough can help heal your body. A cough can last 2 or 3 weeks (acute) or may last more than 8 weeks (chronic). Some common causes of a cough can include an infection, allergy, or a cold.  HOME CARE  · Only take medicine as told by your doctor.  · If given, take your medicines (antibiotics) as told. Finish them even if you start to feel better.  · Use a cold steam vaporizer or humidifier in your home. This can help loosen thick spit (secretions).  · Sleep so you are almost sitting up (semi-upright). Use pillows to do this. This helps reduce coughing.  · Rest as needed.  · Stop smoking if you smoke.  GET HELP RIGHT AWAY IF:  · You have yellowish-white fluid (pus) in your thick spit.  · Your cough gets worse.  · Your medicine does not reduce coughing, and you are losing sleep.  · You cough up blood.  · You have trouble breathing.  · Your pain gets worse and medicine does not help.  · You have a fever.  MAKE SURE YOU:   · Understand these instructions.  · Will watch your condition.  · Will get help right away if you are not doing well or get worse.  Document Released: 09/04/2010 Document Revised: 05/08/2013 Document Reviewed: 09/04/2010  ExitCare® Patient Information ©2015 [employer]. This information is not intended to replace advice given to you by your health care provider. Make sure you discuss any questions you have with your health care provider.

## 2014-01-22 NOTE — ED Notes (Signed)
Onset Friday, head, chest congestion, pt on home 02, coughing with clear drainage, headache

## 2014-01-25 NOTE — ED Provider Notes (Signed)
CSN: [id number]     Arrival date & time 01/22/14  1807 History   First MD Initiated Contact with Patient 01/22/14 1951     Chief Complaint  Patient presents with  . Nasal Congestion  . chest congestion      (Consider location/radiation/quality/duration/timing/severity/associated sxs/prior Treatment) HPI  Johnie E Teigen is a 54 y.o. female with chronic COPD and oxygen dependant, presents to the Emergency Department complaining of nasal and chest congestion and cough for 3 days.  She states she has been taking her albuterol tx's and using her O2 as directed, but feels "like I have a bad cold".  She reports difficulty breathing from her nose.  She reports productive cough with clear sputum, body aches.  She denies increased shortness of breath, chest pain, fever, chills, or sore throat.     Past Medical History  Diagnosis Date  . Anxiety     panic attacks  . Depression   . Hyperlipidemia   . COPD (chronic obstructive pulmonary disease) 03/01/2012  . GERD (gastroesophageal reflux disease)   . Oxygen dependent    Past Surgical History  Procedure Laterality Date  . Tubal ligation    . Colonoscopy N/A 09/09/2012    Procedure: COLONOSCOPY;  Surgeon: Danie Binder, MD;  Location: [facility];  Service: Endoscopy;  Laterality: N/A;  1:15-moved to 12:45 Melanie notified pt   Family History  Problem Relation Age of Onset  . COPD Mother   . Colon cancer Neg Hx    History  Substance Use Topics  . Smoking status: Former Smoker -- 2.50 packs/day for 30 years    Types: Cigarettes    Quit date: 06/12/2011  . Smokeless tobacco: Never Used  . Alcohol Use: No   OB History    No data available     Review of Systems  Constitutional: Negative for fever, chills and appetite change.  HENT: Positive for congestion, rhinorrhea and sneezing. Negative for sore throat and trouble swallowing.   Respiratory: Positive for cough. Negative for chest tightness, shortness of breath and wheezing.    Cardiovascular: Negative for chest pain.  Gastrointestinal: Negative for nausea, vomiting and abdominal pain.  Genitourinary: Negative for dysuria.  Musculoskeletal: Positive for myalgias. Negative for arthralgias and neck pain.  Skin: Negative for rash.  Neurological: Negative for dizziness, weakness and numbness.  Hematological: Negative for adenopathy.  All other systems reviewed and are negative.     Allergies  Review of patient's allergies indicates no known allergies.  Home Medications   Prior to Admission medications   Medication Sig Start Date End Date Taking? Authorizing Provider  albuterol (PROVENTIL HFA;VENTOLIN HFA) 108 (90 BASE) MCG/ACT inhaler Inhale 2 puffs into the lungs every 6 (six) hours as needed. For shortness of breath    Historical Provider, MD  albuterol (PROVENTIL) (2.5 MG/3ML) 0.083% nebulizer solution Take 2.5 mg by nebulization every 4 (four) hours as needed. For shortness of breath    Historical Provider, MD  ALPRAZolam Duanne Moron) 1 MG tablet Take 1 mg by mouth 4 (four) times daily as needed. For anxiety    Historical Provider, MD  aspirin 81 MG tablet Take 81 mg by mouth daily.    Historical Provider, MD  atorvastatin (LIPITOR) 10 MG tablet Take 1 tablet by mouth at bedtime. 02/27/13   Historical Provider, MD  azithromycin (ZITHROMAX) 500 MG tablet Take 1 tablet (500 mg total) by mouth daily. Take one tablet daily for 3 days 01/22/14   Brittian Renaldo L. Raiquan Chandler, PA-C  benzonatate (  TESSALON) 100 MG capsule Take 1 capsule (100 mg total) by mouth 3 (three) times daily as needed for cough. 01/22/14   Emberlin Verner L. Yzabella Crunk, PA-C  fenofibrate micronized (LOFIBRA) 134 MG capsule Take 134 mg by mouth daily before breakfast.    Historical Provider, MD  Fluticasone-Salmeterol (ADVAIR) 500-50 MCG/DOSE AEPB Inhale 1 puff into the lungs every 12 (twelve) hours. 01/24/13   Rigoberto Noel, MD  hydrOXYzine (ATARAX/VISTARIL) 25 MG tablet Take 1 tablet (25 mg total) by mouth at bedtime as  needed (for sleep). 01/22/14   Musa Rewerts L. Chestine Belknap, PA-C  ipratropium (ATROVENT) 0.02 % nebulizer solution Take 2.5 mLs (500 mcg total) by nebulization 4 (four) times daily as needed. For shortness of breath 05/23/13   Derric Dealmeida S Parrett, NP  Linaclotide (LINZESS) 145 MCG CAPS capsule Take 1 capsule (145 mcg total) by mouth daily. Take 30 minutes before breakfast daily for constipation. 08/30/12   Orvil Feil, NP  omeprazole (PRILOSEC) 20 MG capsule Take 20 mg by mouth daily.    Historical Provider, MD  pramoxine-hydrocortisone (PROCTOCREAM-HC) 1-1 % rectal cream USE PR 4 TIMES A DAY FOR 10 DAYS. 09/09/12   Danie Binder, MD   BP 131/95 mmHg  Pulse 72  Temp(Src) 98 F (36.7 C) (Oral)  Resp 20  Ht 5\' 3"  (1.6 m)  Wt 180 lb (81.647 kg)  BMI 31.89 kg/m2  SpO2 98%  LMP 05/06/2010 Physical Exam  Constitutional: She is oriented to person, place, and time. She appears well-developed and well-nourished. No distress.  HENT:  Head: Normocephalic and atraumatic.  Right Ear: Tympanic membrane and ear canal normal.  Left Ear: Tympanic membrane and ear canal normal.  Nose: Mucosal edema and rhinorrhea present.  Mouth/Throat: Uvula is midline, oropharynx is clear and moist and mucous membranes are normal. No oropharyngeal exudate.  Eyes: EOM are normal. Pupils are equal, round, and reactive to light.  Neck: Normal range of motion, full passive range of motion without pain and phonation normal. Neck supple.  Cardiovascular: Normal rate, regular rhythm, normal heart sounds and intact distal pulses.   No murmur heard. Pulmonary/Chest: Effort normal and breath sounds normal. No stridor. No respiratory distress. She has no decreased breath sounds. She has no wheezes. She has no rhonchi. She has no rales. She exhibits no tenderness.  Musculoskeletal: Normal range of motion. She exhibits no edema.  No edema of the LE's  Lymphadenopathy:    She has no cervical adenopathy.  Neurological: She is alert and oriented to  person, place, and time. She exhibits normal muscle tone. Coordination normal.  Skin: Skin is warm and dry.  Nursing note and vitals reviewed.   ED Course  Procedures (including critical care time) Labs Review Labs Reviewed - No data to display  Imaging Review Dg Chest 2 View  01/22/2014   CLINICAL DATA:  Onset Friday, head, chest congestion, pt on home 02, coughing with clear drainage, headache  EXAM: CHEST  2 VIEW  COMPARISON:  Chest CT, 11/15/2013.  FINDINGS: Cardiac silhouette normal in size and configuration. Normal mediastinal and hilar contours.  Lungs are hyperexpanded. No lung consolidation or edema. No pleural effusion or pneumothorax.  Bony thorax is intact.  IMPRESSION: 1. No acute cardiopulmonary disease. 2. COPD.   Electronically Signed   By: Lajean Manes M.D.   On: 01/22/2014 18:50     EKG Interpretation None      MDM   Final diagnoses:  Cough  Chest congestion    Pt is non-toxic appearing.  Productive cough, no wheezing or rales.  On continuous home O2.  No active distress on exam.  Vitals improved on recheck, no tachycardia or hypoxia. Ambulates without respiratory compromise .  CXR neg for PNA.  Pt feeling better after tussionex.  Has albuterol at home.  She agrees to close f/u with her pulmonologist in 1-2 days for recheck or to return here if the sx's are not improving.      Ethleen Lormand L. Vanessa Clifton, PA-C 01/25/14 [place] Alvino Chapel, MD 01/26/14 2248

## 2014-05-03 ENCOUNTER — Telehealth: Payer: Self-pay | Admitting: Adult Health

## 2014-05-03 NOTE — Telephone Encounter (Signed)
ATC pt line busy x 4 WCB

## 2014-05-04 NOTE — Telephone Encounter (Signed)
Spoke with pt - needing a refill of Alprazolam (has 1 pill left) Has not been able to get in touch with Dr Karie Kirks office (PCP) to get this refill.  Advised that I would try and call PCP to have them contact her and if not able to get in touch then will ask Dr Elsworth Soho.   Called Dr Karie Kirks office - they stated that they spoke with the patient about 30 mins ago and have a message sent to nurse to contact patient about refill.  Called patient and advised that Dr Elsworth Soho will not be refilling this as a message is with her PCP to approve.  Nothing further needed.

## 2014-06-26 ENCOUNTER — Other Ambulatory Visit (HOSPITAL_COMMUNITY): Payer: Self-pay | Admitting: Family Medicine

## 2014-06-26 DIAGNOSIS — Z1231 Encounter for screening mammogram for malignant neoplasm of breast: Secondary | ICD-10-CM

## 2014-07-23 ENCOUNTER — Ambulatory Visit (HOSPITAL_COMMUNITY)
Admission: RE | Admit: 2014-07-23 | Discharge: 2014-07-23 | Disposition: A | Discharge status: Home / Self Care | Payer: Medicare Other | Source: Ambulatory Visit | Attending: Family Medicine | Admitting: Family Medicine

## 2014-07-23 DIAGNOSIS — Z1231 Encounter for screening mammogram for malignant neoplasm of breast: Secondary | ICD-10-CM

## 2014-11-13 DIAGNOSIS — J449 Chronic obstructive pulmonary disease, unspecified: Secondary | ICD-10-CM | POA: Diagnosis not present

## 2015-07-25 ENCOUNTER — Other Ambulatory Visit (HOSPITAL_COMMUNITY): Payer: Self-pay | Admitting: Family Medicine

## 2015-07-25 DIAGNOSIS — Z1231 Encounter for screening mammogram for malignant neoplasm of breast: Secondary | ICD-10-CM

## 2015-07-29 ENCOUNTER — Ambulatory Visit (HOSPITAL_COMMUNITY)
Admission: RE | Admit: 2015-07-29 | Discharge: 2015-07-29 | Disposition: A | Discharge status: Home / Self Care | Payer: Medicare Other | Source: Ambulatory Visit | Attending: Family Medicine | Admitting: Family Medicine

## 2015-07-29 DIAGNOSIS — Z1231 Encounter for screening mammogram for malignant neoplasm of breast: Secondary | ICD-10-CM | POA: Diagnosis not present

## 2015-11-12 DIAGNOSIS — J449 Chronic obstructive pulmonary disease, unspecified: Secondary | ICD-10-CM | POA: Diagnosis not present

## 2016-07-31 ENCOUNTER — Other Ambulatory Visit (INDEPENDENT_AMBULATORY_CARE_PROVIDER_SITE_OTHER): Payer: Medicare Other

## 2016-07-31 ENCOUNTER — Ambulatory Visit (INDEPENDENT_AMBULATORY_CARE_PROVIDER_SITE_OTHER): Payer: Medicare Other | Admitting: Internal Medicine

## 2016-07-31 ENCOUNTER — Encounter: Payer: Self-pay | Admitting: Internal Medicine

## 2016-07-31 ENCOUNTER — Ambulatory Visit (INDEPENDENT_AMBULATORY_CARE_PROVIDER_SITE_OTHER)
Admission: RE | Admit: 2016-07-31 | Discharge: 2016-07-31 | Disposition: A | Discharge status: Home / Self Care | Payer: Medicare Other | Source: Ambulatory Visit | Attending: Internal Medicine | Admitting: Internal Medicine

## 2016-07-31 VITALS — BP 98/62 | HR 94 | Temp 98.3°F | Ht 63.0 in | Wt 145.6 lb

## 2016-07-31 DIAGNOSIS — J449 Chronic obstructive pulmonary disease, unspecified: Secondary | ICD-10-CM

## 2016-07-31 DIAGNOSIS — J9612 Chronic respiratory failure with hypercapnia: Secondary | ICD-10-CM

## 2016-07-31 DIAGNOSIS — J9611 Chronic respiratory failure with hypoxia: Secondary | ICD-10-CM

## 2016-07-31 DIAGNOSIS — E876 Hypokalemia: Secondary | ICD-10-CM | POA: Diagnosis not present

## 2016-07-31 LAB — CBC WITH DIFFERENTIAL/PLATELET
Basophils Absolute: 0 10*3/uL (ref 0.0–0.1)
Basophils Relative: 0.9 % (ref 0.0–3.0)
Eosinophils Absolute: 0.1 10*3/uL (ref 0.0–0.7)
Eosinophils Relative: 2.1 % (ref 0.0–5.0)
HEMATOCRIT: 41.8 % (ref 36.0–46.0)
Hemoglobin: 13.4 g/dL (ref 12.0–15.0)
LYMPHS PCT: 27.4 % (ref 12.0–46.0)
Lymphs Abs: 1.5 10*3/uL (ref 0.7–4.0)
MCHC: 32 g/dL (ref 30.0–36.0)
MCV: 92 fl (ref 78.0–100.0)
Monocytes Absolute: 0.2 10*3/uL (ref 0.1–1.0)
Monocytes Relative: 4.4 % (ref 3.0–12.0)
Neutro Abs: 3.7 10*3/uL (ref 1.4–7.7)
Neutrophils Relative %: 65.2 % (ref 43.0–77.0)
Platelets: 222 10*3/uL (ref 150.0–400.0)
RBC: 4.54 Mil/uL (ref 3.87–5.11)
RDW: 13.1 % (ref 11.5–15.5)
WBC: 5.6 10*3/uL (ref 4.0–10.5)

## 2016-07-31 LAB — BASIC METABOLIC PANEL
BUN: 8 mg/dL (ref 6–23)
CO2: 38 mEq/L — ABNORMAL HIGH (ref 19–32)
Calcium: 9.2 mg/dL (ref 8.4–10.5)
Chloride: 98 mEq/L (ref 96–112)
Creatinine, Ser: 0.77 mg/dL (ref 0.40–1.20)
GFR: 82.81 mL/min (ref >60.00)
Glucose, Bld: 100 mg/dL — ABNORMAL HIGH (ref 70–99)
Potassium: 3.2 mEq/L — ABNORMAL LOW (ref 3.5–5.1)
Sodium: 140 mEq/L (ref 135–145)

## 2016-07-31 NOTE — Progress Notes (Signed)
Subjective:    Patient ID: Donna Watkins, female    DOB: Jan 02, 1961,    MRN: [id number]  HPI  15 yowm quit smoking in 2013 with severe copd at baseline last seen in pulmonary clinic  by [facility] 11/2013  GOLD IV criteria with PFTs 05/2012 ith fev1 only 26%, diffusion at 52%     07/31/2016 1st office visit/    GOLD IV copd/ 02 2lpm 24/7  Chief Complaint  Patient presents with  . Pulmonary Consult    Pt is self referred for COPD. Pt c/o DOE, throat clearing. Pt denies CP/tightness anf recentl f/c/s. Pt states she rarely uses albuterol hfa.   doe -  turns up 3lpm walking but still doe x 50 ft Use scooter at [employer] x 5 years  Sleeps on 4lpm / no am ha / cough or congestion  Uses neb avg bid, very poor  insight into meds/02  Some better breathing p neb saba   No obvious day to day or daytime variability or assoc excess/ purulent sputum or mucus plugs or hemoptysis or cp or chest tightness, subjective wheeze or overt sinus or hb symptoms. No unusual exp hx or h/o childhood pna/ asthma or knowledge of premature birth.  Sleeping ok on 4lpm  without nocturnal  or early am exacerbation  of respiratory  c/o's or need for noct saba. Also denies any obvious fluctuation of symptoms with weather or environmental changes or other aggravating or alleviating factors except as outlined above   Current Medications, Allergies, Complete Past Medical History, Past Surgical History, Family History, and Social History were reviewed in [employer] record.        Review of Systems  Constitutional: Negative for chills, fever and unexpected weight change.  HENT: Negative for congestion, dental problem, ear pain, nosebleeds, postnasal drip, rhinorrhea, sinus pressure, sneezing, sore throat, trouble swallowing and voice change.   Eyes: Negative for visual disturbance.  Respiratory: Positive for cough and shortness of breath. Negative for choking.   Cardiovascular: Negative  for chest pain and leg swelling.  Gastrointestinal: Negative for abdominal pain, diarrhea and vomiting.  Genitourinary: Negative for difficulty urinating.  Musculoskeletal: Negative for arthralgias.  Skin: Negative for rash.  Neurological: Negative for tremors, syncope and headaches.  Hematological: Does not bruise/bleed easily.       Objective:   Physical Exam   Chronically ill wf much > stated age   Wt Readings from Last 3 Encounters:  07/31/16 145 lb 9.6 oz (66 kg)  01/22/14 180 lb (81.6 kg)  05/23/13 180 lb 12.8 oz (82 kg)    Vital signs reviewed  - Note on arrival 02 sats  88% on RA  And  95% on 2lpm continuous    HEENT: nl   turbinates bilaterally, and oropharynx. Nl external ear canals without cough reflex- very poor dentition    NECK :  without JVD/Nodes/TM/ nl carotid upstrokes bilaterally   LUNGS: no acc muscle use,  slt barrel contour chest with hyper-resonance bilaterally and very distant bs s audible wheeze    CV:  RRR  no s3 or murmur or increase in P2, and no significant peripheral edema   ABD:  soft and nontender with nl inspiratory excursion in the supine position. No bruits or organomegaly appreciated, bowel sounds nl  MS:  Nl gait/ ext warm without deformities, calf tenderness, cyanosis or clubbing No obvious joint restrictions   SKIN: warm and dry without lesions    NEURO:  alert, approp, nl sensorium with  no motor or cerebellar deficits apparent.    CXR PA and Lateral:   07/31/2016 :    I personally reviewed images and agree with radiology impression as follows:    COPD without acute abnormality.   Labs ordered/ reviewed:      Chemistry      Component Value Date/Time   NA 140 07/31/2016 1715   K 3.2 (L) 07/31/2016 1715   CL 98 07/31/2016 1715   CO2 38 (H) 07/31/2016 1715   BUN 8 07/31/2016 1715   CREATININE 0.77 07/31/2016 1715      Component Value Date/Time   CALCIUM 9.2 07/31/2016 1715   ALKPHOS 66 06/12/2011 2341   AST 15  06/12/2011 2341   ALT 13 06/12/2011 2341   BILITOT 0.9 06/12/2011 2341        Lab Results  Component Value Date   WBC 5.6 07/31/2016   HGB 13.4 07/31/2016   HCT 41.8 07/31/2016   MCV 92.0 07/31/2016   PLT 222.0 07/31/2016       EOS                       0.1                                                                   07/31/2016    Labs ordered 07/31/2016   Alpha one screening      Assessment & Plan:

## 2016-07-31 NOTE — Assessment & Plan Note (Addendum)
07/31/2016  Patient Saturations on Room Air at Rest = 88% and while Ambulating = 83% - HC03  07/31/2016  = 38  - 07/31/2016   Walked 3lpm  2 laps @ 185 ft each stopped due to  Sob at moderate pace with no desat   Though somewhat paradoxic, when the lung fails to clear C02 properly and pC02 rises the lung then becomes a more efficient scavenger of C02 allowing lower work of breathing and  better C02 clearance albeit at a higher serum pC02 level - this is why pts can look a lot better than their ABG's would suggest and why it's so difficult to prognosticate endstage dz.  It's also why I strongly rec DNI status (ventilating pts down to a nl pC02 adversely affects this compensatory mechanism)   rec 2lpm at rest and sleeping and 3lpm with activity and strongly rec restart pulmonary rehab.

## 2016-07-31 NOTE — Patient Instructions (Addendum)
Please remember to go to the lab and x-ray department downstairs in the basement  for your tests - we will call you with the results when they are available.  Call us if you want to be referred to [facility] for pulmonary rehab - in meantime continue 2lpm at rest and 3lpm with activity      Please schedule a follow up visit in 3 months but call sooner if needed with full pfts on return  Add: recheck K on high K diet

## 2016-07-31 NOTE — Assessment & Plan Note (Addendum)
Quit smoking 2013 - PFT's  04/27/12  FEV1 0.70 (26 %) ratio 35  p no % improvement from saba p ? prior to study with DLCO  53 % corrects to 69 % for alv volume with classic curvature    Severe copd but relatively stable on min medications - my concern is she's only 56 years old and acting like 85 so rec 1) needs alpha one screening 2) consider pulmonary rehab  3) repeat pfts and consider bevespi, anoro or stiolto  because at this point Pt is Group B in terms of symptom/risk and laba/lama therefore appropriate rx at this point.    Total time devoted to counseling  > 50 % of initial 60 min office visit:  review case with pt/ discussion of options/alternatives/ personally creating written customized instructions  in presence of pt  then going over those specific  Instructions directly with the pt including how to use all of the meds but in particular covering each new medication in detail and the difference between the maintenance= "automatic" meds and the prns using an action plan format for the latter (If this problem/symptom => do that organization reading Left to right).  Please see AVS from this visit for a full list of these instructions which I personally wrote for this pt and  are unique to this visit.

## 2016-08-03 ENCOUNTER — Other Ambulatory Visit: Payer: Self-pay | Admitting: Internal Medicine

## 2016-08-03 ENCOUNTER — Other Ambulatory Visit (HOSPITAL_COMMUNITY): Payer: Self-pay | Admitting: Family Medicine

## 2016-08-03 DIAGNOSIS — J9612 Chronic respiratory failure with hypercapnia: Principal | ICD-10-CM

## 2016-08-03 DIAGNOSIS — Z1231 Encounter for screening mammogram for malignant neoplasm of breast: Secondary | ICD-10-CM

## 2016-08-03 DIAGNOSIS — J9611 Chronic respiratory failure with hypoxia: Secondary | ICD-10-CM

## 2016-08-03 LAB — ALPHA-1-ANTITRYPSIN: A1 ANTITRYPSIN SER: 145 mg/dL (ref 83–199)

## 2016-08-03 NOTE — Progress Notes (Signed)
LMTCB

## 2016-08-03 NOTE — Assessment & Plan Note (Signed)
Not on diuretics or reporting any vomiting so rec increase K containing foods

## 2016-08-04 ENCOUNTER — Telehealth: Payer: Self-pay | Admitting: Internal Medicine

## 2016-08-04 LAB — ALPHA-1 ANTITRYPSIN PHENOTYPE: A-1 Antitrypsin: 157 mg/dL (ref 83–199)

## 2016-08-04 NOTE — Telephone Encounter (Signed)
Called and spoke to pt. Informed her of the results and recs per MW. Pt verbalized understanding and denied any further questions or concerns at this time.     Notes recorded by Tanda Rockers, MD on 08/01/2016 at 6:25 AM EDT Call pt: Reviewed cxr and no acute change so no change in recommendations made at ov  Notes recorded by Tanda Rockers, MD on 08/03/2016 at 2:39 PM EDT Call patient : Studies are c/w mild hypokalemia so rec lots of fruits/vegetables in diet but no need to change rx (unless on diuretic she didn't report)

## 2016-08-04 NOTE — Progress Notes (Signed)
See phone note from 08/04/16. Will sign off.

## 2016-08-10 ENCOUNTER — Ambulatory Visit (HOSPITAL_COMMUNITY): Payer: Medicare Other

## 2016-08-17 ENCOUNTER — Ambulatory Visit (HOSPITAL_COMMUNITY)
Admission: RE | Admit: 2016-08-17 | Discharge: 2016-08-17 | Disposition: A | Discharge status: Home / Self Care | Payer: Medicare Other | Source: Ambulatory Visit | Attending: Family Medicine | Admitting: Family Medicine

## 2016-08-17 DIAGNOSIS — Z1231 Encounter for screening mammogram for malignant neoplasm of breast: Secondary | ICD-10-CM

## 2016-08-24 ENCOUNTER — Telehealth: Payer: Self-pay | Admitting: Internal Medicine

## 2016-08-24 DIAGNOSIS — J9601 Acute respiratory failure with hypoxia: Secondary | ICD-10-CM

## 2016-08-24 NOTE — Telephone Encounter (Signed)
Attempted to contact pt. Received a fast busy signal x3. Will try back.

## 2016-08-25 NOTE — Telephone Encounter (Signed)
Spoke with pt. Pt states [employer] contacted her last week, requesting to have a debit card on file. Pt states she would like prefer to not have her debit care on file. Pt is requesting to go through [facility].  Order has been placed to [facility] per pt request.  Nothing further needed.

## 2016-08-25 NOTE — Telephone Encounter (Signed)
lmomtcb x 2  

## 2016-10-30 ENCOUNTER — Other Ambulatory Visit: Payer: Self-pay | Admitting: Internal Medicine

## 2016-10-30 DIAGNOSIS — R0600 Dyspnea, unspecified: Secondary | ICD-10-CM

## 2016-11-02 ENCOUNTER — Ambulatory Visit: Payer: Medicare Other | Admitting: Internal Medicine

## 2016-11-10 ENCOUNTER — Ambulatory Visit: Payer: Medicare Other | Admitting: Internal Medicine

## 2016-11-12 ENCOUNTER — Ambulatory Visit: Payer: Medicare Other | Admitting: Internal Medicine

## 2016-11-18 ENCOUNTER — Other Ambulatory Visit (HOSPITAL_COMMUNITY): Payer: Self-pay | Admitting: Family Medicine

## 2016-11-18 DIAGNOSIS — R8290 Unspecified abnormal findings in urine: Secondary | ICD-10-CM

## 2016-11-18 DIAGNOSIS — R109 Unspecified abdominal pain: Secondary | ICD-10-CM

## 2016-11-18 DIAGNOSIS — R634 Abnormal weight loss: Secondary | ICD-10-CM

## 2016-11-23 ENCOUNTER — Other Ambulatory Visit (HOSPITAL_COMMUNITY): Payer: Self-pay | Admitting: Family Medicine

## 2016-11-23 DIAGNOSIS — R634 Abnormal weight loss: Secondary | ICD-10-CM

## 2016-11-25 ENCOUNTER — Other Ambulatory Visit (HOSPITAL_COMMUNITY): Payer: Self-pay | Admitting: Family Medicine

## 2016-11-25 DIAGNOSIS — R8291 Other chromoabnormalities of urine: Secondary | ICD-10-CM

## 2016-11-25 DIAGNOSIS — N390 Urinary tract infection, site not specified: Secondary | ICD-10-CM

## 2016-12-01 ENCOUNTER — Ambulatory Visit (HOSPITAL_COMMUNITY): Payer: Medicare Other

## 2016-12-02 ENCOUNTER — Ambulatory Visit (HOSPITAL_COMMUNITY)
Admission: RE | Admit: 2016-12-02 | Discharge: 2016-12-02 | Disposition: A | Discharge status: Home / Self Care | Payer: Medicare Other | Source: Ambulatory Visit | Attending: Family Medicine | Admitting: Family Medicine

## 2016-12-02 DIAGNOSIS — J439 Emphysema, unspecified: Secondary | ICD-10-CM | POA: Insufficient documentation

## 2016-12-02 DIAGNOSIS — N390 Urinary tract infection, site not specified: Secondary | ICD-10-CM

## 2016-12-02 DIAGNOSIS — N289 Disorder of kidney and ureter, unspecified: Secondary | ICD-10-CM | POA: Diagnosis not present

## 2016-12-02 DIAGNOSIS — R634 Abnormal weight loss: Secondary | ICD-10-CM

## 2016-12-02 MED ORDER — IOPAMIDOL (ISOVUE-300) INJECTION 61%
100.0000 mL | Freq: Once | INTRAVENOUS | Status: AC | PRN
  Administered 2016-12-02: 100 mL via INTRAVENOUS

## 2016-12-07 ENCOUNTER — Other Ambulatory Visit (HOSPITAL_COMMUNITY): Payer: Self-pay | Admitting: Family Medicine

## 2016-12-07 DIAGNOSIS — Z122 Encounter for screening for malignant neoplasm of respiratory organs: Secondary | ICD-10-CM

## 2016-12-15 ENCOUNTER — Ambulatory Visit: Payer: Medicare Other | Admitting: Internal Medicine

## 2016-12-15 ENCOUNTER — Ambulatory Visit: Payer: Medicare Other | Admitting: General Surgery

## 2016-12-16 ENCOUNTER — Ambulatory Visit (HOSPITAL_COMMUNITY): Admission: RE | Admit: 2016-12-16 | Payer: Medicare Other | Source: Ambulatory Visit

## 2016-12-17 ENCOUNTER — Ambulatory Visit: Payer: Medicare Other | Admitting: General Surgery

## 2016-12-30 ENCOUNTER — Ambulatory Visit (HOSPITAL_COMMUNITY)
Admission: RE | Admit: 2016-12-30 | Discharge: 2016-12-30 | Disposition: A | Discharge status: Home / Self Care | Payer: Medicare Other | Source: Ambulatory Visit | Attending: Family Medicine | Admitting: Family Medicine

## 2016-12-30 DIAGNOSIS — R634 Abnormal weight loss: Secondary | ICD-10-CM | POA: Diagnosis present

## 2016-12-30 DIAGNOSIS — K5730 Diverticulosis of large intestine without perforation or abscess without bleeding: Secondary | ICD-10-CM | POA: Insufficient documentation

## 2016-12-30 DIAGNOSIS — Z9981 Dependence on supplemental oxygen: Secondary | ICD-10-CM | POA: Insufficient documentation

## 2016-12-30 DIAGNOSIS — G4700 Insomnia, unspecified: Secondary | ICD-10-CM | POA: Diagnosis not present

## 2016-12-30 DIAGNOSIS — K760 Fatty (change of) liver, not elsewhere classified: Secondary | ICD-10-CM | POA: Insufficient documentation

## 2016-12-30 DIAGNOSIS — J449 Chronic obstructive pulmonary disease, unspecified: Secondary | ICD-10-CM | POA: Insufficient documentation

## 2016-12-30 DIAGNOSIS — E782 Mixed hyperlipidemia: Secondary | ICD-10-CM | POA: Diagnosis not present

## 2016-12-30 DIAGNOSIS — Z87891 Personal history of nicotine dependence: Secondary | ICD-10-CM | POA: Diagnosis present

## 2016-12-30 DIAGNOSIS — J439 Emphysema, unspecified: Secondary | ICD-10-CM | POA: Diagnosis not present

## 2016-12-30 DIAGNOSIS — I2510 Atherosclerotic heart disease of native coronary artery without angina pectoris: Secondary | ICD-10-CM | POA: Diagnosis not present

## 2016-12-30 DIAGNOSIS — Z122 Encounter for screening for malignant neoplasm of respiratory organs: Secondary | ICD-10-CM | POA: Insufficient documentation

## 2016-12-31 ENCOUNTER — Encounter: Payer: Self-pay | Admitting: Internal Medicine

## 2016-12-31 ENCOUNTER — Ambulatory Visit (INDEPENDENT_AMBULATORY_CARE_PROVIDER_SITE_OTHER): Payer: Medicare Other | Admitting: Internal Medicine

## 2016-12-31 VITALS — BP 112/68 | HR 67 | Ht 63.0 in | Wt 136.0 lb

## 2016-12-31 DIAGNOSIS — R0600 Dyspnea, unspecified: Secondary | ICD-10-CM

## 2016-12-31 DIAGNOSIS — J9611 Chronic respiratory failure with hypoxia: Secondary | ICD-10-CM | POA: Diagnosis not present

## 2016-12-31 DIAGNOSIS — J9612 Chronic respiratory failure with hypercapnia: Secondary | ICD-10-CM

## 2016-12-31 DIAGNOSIS — J449 Chronic obstructive pulmonary disease, unspecified: Secondary | ICD-10-CM | POA: Diagnosis not present

## 2016-12-31 LAB — PULMONARY FUNCTION TEST
DL/VA % pred: 50 %
DL/VA: 2.37 ml/min/mmHg/L
DLCO cor % pred: 38 %
DLCO cor: 8.87 ml/min/mmHg
DLCO unc % pred: 37 %
DLCO unc: 8.53 ml/min/mmHg
FEF 25-75 Post: 0.28 L/sec
FEF 25-75 Pre: 0.32 L/sec
FEF2575-%Change-Post: -12 %
FEF2575-%Pred-Post: 11 %
FEF2575-%Pred-Pre: 12 %
FEV1-%Change-Post: -10 %
FEV1-%Pred-Post: 25 %
FEV1-%Pred-Pre: 28 %
FEV1-Post: 0.66 L
FEV1-Pre: 0.74 L
FEV1FVC-%Change-Post: -4 %
FEV1FVC-%Pred-Pre: 48 %
FEV6-%Change-Post: -4 %
FEV6-%Pred-Post: 53 %
FEV6-%Pred-Pre: 56 %
FEV6-Post: 1.72 L
FEV6-Pre: 1.81 L
FEV6FVC-%Change-Post: 0 %
FEV6FVC-%Pred-Post: 97 %
FEV6FVC-%Pred-Pre: 96 %
FVC-%Change-Post: -5 %
FVC-%Pred-Post: 55 %
FVC-%Pred-Pre: 58 %
FVC-Post: 1.83 L
FVC-Pre: 1.94 L
Post FEV1/FVC ratio: 36 %
Post FEV6/FVC ratio: 94 %
Pre FEV1/FVC ratio: 38 %
Pre FEV6/FVC Ratio: 94 %
RV % pred: 236 %
RV: 4.35 L
TLC % pred: 132 %
TLC: 6.49 L

## 2016-12-31 NOTE — Progress Notes (Signed)
Subjective:    Patient ID: Donna Watkins, female    DOB: December 16, 1960,    MRN: [id number]    Brief patient profile:  72 yowm quit smoking in 2013 with severe copd at baseline last seen in pulmonary clinic  by [facility] 11/2013  GOLD IV criteria with PFTs 05/2012 ith fev1 only 26%, diffusion at 52%     07/31/2016 1st office visit/    GOLD IV copd/ 02 2lpm 24/7  Chief Complaint  Patient presents with  . Pulmonary Consult    Pt is self referred for COPD. Pt c/o DOE, throat clearing. Pt denies CP/tightness anf recentl f/c/s. Pt states she rarely uses albuterol hfa.   doe -  turns up 3lpm walking but still doe x 50 ft Use scooter at [employer] x 5 years  Sleeps on 4lpm / no am ha / cough or congestion  Uses neb avg bid, very poor  insight into meds/02  Some better breathing p neb saba rec Please remember to go to the lab and x-ray department downstairs in the basement  for your tests - we will call you with the results when they are available. Call us if you want to be referred to [facility] for pulmonary rehab - in meantime continue 2lpm at rest and 3lpm with activity    Please schedule a follow up visit in 3 months but call sooner if needed with full pfts on return      12/31/2016  f/u ov/ re:  2lpm 24/7  Chief Complaint  Patient presents with  . Follow-up    review PFT results. States breathing has been ok since last visit.    breathing ok at rest but can't walk 300 ft even on up to 3lpm s sob on flat surface slow pace   No obvious day to day or daytime variability or assoc excess/ purulent sputum or mucus plugs or hemoptysis or cp or chest tightness, subjective wheeze or overt sinus or hb symptoms. No unusual exposure hx or h/o childhood pna/ asthma or knowledge of premature birth.  Sleeping ok flat without nocturnal  or early am exacerbation  of respiratory  c/o's or need for noct saba. Also denies any obvious fluctuation of symptoms with weather or  environmental changes or other aggravating or alleviating factors except as outlined above   Current Allergies, Complete Past Medical History, Past Surgical History, Family History, and Social History were reviewed in [employer] record.  ROS  The following are not active complaints unless bolded Hoarseness, sore throat, dysphagia, dental problems, itching, sneezing,  nasal congestion or discharge of excess mucus or purulent secretions, ear ache,   fever, chills, sweats, unintended wt loss or wt gain, classically pleuritic or exertional cp,  orthopnea pnd or leg swelling, presyncope, palpitations, abdominal pain, anorexia, nausea, vomiting, diarrhea  or change in bowel habits or change in bladder habits, change in stools or change in urine, dysuria, hematuria,  rash, arthralgias, visual complaints, headache, numbness, weakness or ataxia or problems with walking or coordination,  change in mood/affect or memory.        Current Meds  Medication Sig  . albuterol (PROVENTIL HFA;VENTOLIN HFA) 108 (90 BASE) MCG/ACT inhaler Inhale 2 puffs into the lungs every 6 (six) hours as needed. For shortness of breath  . albuterol (PROVENTIL) (2.5 MG/3ML) 0.083% nebulizer solution Take 2.5 mg by nebulization every 4 (four) hours as needed. For shortness of breath  . ALPRAZolam Duanne Moron) 1  MG tablet Take 1 mg by mouth 4 (four) times daily as needed. For anxiety  . aspirin 81 MG tablet Take 81 mg by mouth daily.  Marland Kitchen atorvastatin (LIPITOR) 10 MG tablet Take 1 tablet by mouth at bedtime.  Marland Kitchen ipratropium (ATROVENT) 0.02 % nebulizer solution Take 2.5 mLs (500 mcg total) by nebulization 4 (four) times daily as needed. For shortness of breath  . omeprazole (PRILOSEC) 20 MG capsule Take 20 mg by mouth daily.                    Objective:   Physical Exam    amb wf nad    12/31/2016      136   07/31/16 145 lb 9.6 oz (66 kg)  01/22/14 180 lb (81.6 kg)  05/23/13 180 lb 12.8 oz (82 kg)      Vital signs reviewed - Note on arrival 02 sats  93% on 2lpm       HEENT: nl   turbinates bilaterally, and oropharynx. Nl external ear canals without cough reflex- very poor dentition        LUNGS: no acc muscle use,  slt barrel contour chest with hyper-resonance bilaterally and very distant bs s audible wheeze        HEENT: nl   turbinates bilaterally, and oropharynx. Nl external ear canals without cough reflex - very poor dentition    NECK :  without JVD/Nodes/TM/ nl carotid upstrokes bilaterally   LUNGS: no acc muscle use,  Barrel contour chest/ hyperresonant to percussion/ distant bs bilaterally s wheeze    CV:  RRR  no s3 or murmur or increase in P2, and no edema   ABD:  soft and nontender with nl inspiratory excursion in the supine position. No bruits or organomegaly appreciated, bowel sounds nl  MS:  Nl gait/ ext warm without deformities, calf tenderness, cyanosis or clubbing No obvious joint restrictions / mild muscle wasting diffusely  SKIN: warm and dry without lesions    NEURO:  alert, approp, nl sensorium with  no motor or cerebellar deficits apparent.      I personally reviewed images and agree with radiology impression as follows:   Chest CT 12/31/16 LDSCT:  1. Lung-RADS 2, benign appearance or behavior. Continue annual screening with low-dose chest CT without contrast in 12 months. 2.  Emphysema    Assessment & Plan:

## 2016-12-31 NOTE — Patient Instructions (Signed)
Stay on alb/ipatropium up to 4 x daily   Please schedule a follow up visit in 6  months but call sooner if needed

## 2016-12-31 NOTE — Progress Notes (Signed)
PFT done today. 

## 2017-01-02 ENCOUNTER — Encounter: Payer: Self-pay | Admitting: Internal Medicine

## 2017-01-02 NOTE — Assessment & Plan Note (Addendum)
Quit smoking 2013 - PFT's  04/27/12  FEV1 0.70 (26 %) ratio 35  p no % improvement from saba p ? prior to study with DLCO  53 % corrects to 69 % for alv volume with classic curvature   - Alpha one AT screening   07/2716 >   MM - PFT's  12/31/2016  FEV1 0.74 (28 % ) ratio 38  p no % improvement from saba p saba 4h   prior to study with DLCO  37/38c % corrects to 50 % for alv volume    Very severe copd but no change x 5 years since quit smoking and mostly characterized by emphysema and no tendency to aecopd so   No change rx needed here> symptoms controlled on an affordable regimen of duoneb qid.  I had an extended discussion with the patient reviewing all relevant studies completed to date and  lasting 15 to 20 minutes of a 25 minute visit    Each maintenance medication was reviewed in detail including most importantly the difference between maintenance and prns and under what circumstances the prns are to be triggered using an action plan format that is not reflected in the computer generated alphabetically organized AVS.    Please see AVS for specific instructions unique to this visit that I personally wrote and verbalized to the the pt in detail and then reviewed with pt  by my nurse highlighting any  changes in therapy recommended at today's visit to their plan of care.

## 2017-01-02 NOTE — Assessment & Plan Note (Addendum)
07/31/2016  Patient Saturations on Room Air at Rest = 88% and while Ambulating = 83% - HC03  07/31/2016  = 38  - 07/31/2016   Walked 3lpm  2 laps @ 185 ft each stopped due to  Sob at moderate pace with no desat    Well compensated clinically with adequate sats on 2lpm at rest / 3lpm with activity

## 2017-01-07 ENCOUNTER — Ambulatory Visit: Payer: Medicare Other | Admitting: General Surgery

## 2017-02-04 ENCOUNTER — Ambulatory Visit (INDEPENDENT_AMBULATORY_CARE_PROVIDER_SITE_OTHER): Payer: Medicare Other | Admitting: General Surgery

## 2017-02-04 ENCOUNTER — Encounter: Payer: Self-pay | Admitting: General Surgery

## 2017-02-04 VITALS — BP 106/60 | HR 89 | Temp 98.4°F | Resp 18 | Ht 63.0 in | Wt 130.0 lb

## 2017-02-04 DIAGNOSIS — K828 Other specified diseases of gallbladder: Secondary | ICD-10-CM | POA: Diagnosis not present

## 2017-02-04 NOTE — Patient Instructions (Signed)
Cholelithiasis Cholelithiasis is also called "gallstones." It is a kind of gallbladder disease. The gallbladder is an organ that stores a liquid (bile) that helps you digest fat. Gallstones may not cause symptoms (may be silent gallstones) until they cause a blockage, and then they can cause pain (gallbladder attack). Follow these instructions at home:  Take over-the-counter and prescription medicines only as told by your doctor.  Stay at a healthy weight.  Eat healthy foods. This includes: ? Eating fewer fatty foods, like fried foods. ? Eating fewer refined carbs (refined carbohydrates). Refined carbs are breads and grains that are highly processed, like white bread and white rice. Instead, choose whole grains like whole-wheat bread and brown rice. ? Eating more fiber. Almonds, fresh fruit, and beans are healthy sources of fiber.  Keep all follow-up visits as told by your doctor. This is important. Contact a doctor if:  You have sudden pain in the upper right side of your belly (abdomen). Pain might spread to your right shoulder or your chest. This may be a sign of a gallbladder attack.  You feel sick to your stomach (are nauseous).  You throw up (vomit).  You have been diagnosed with gallstones that have no symptoms and you get: ? Belly pain. ? Discomfort, burning, or fullness in the upper part of your belly (indigestion). Get help right away if:  You have sudden pain in the upper right side of your belly, and it lasts for more than 2 hours.  You have belly pain that lasts for more than 5 hours.  You have a fever or chills.  You keep feeling sick to your stomach or you keep throwing up.  Your skin or the whites of your eyes turn yellow (jaundice).  You have dark-colored pee (urine).  You have light-colored poop (stool). Summary  Cholelithiasis is also called "gallstones."  The gallbladder is an organ that stores a liquid (bile) that helps you digest fat.  Silent  gallstones are gallstones that do not cause symptoms.  A gallbladder attack may cause sudden pain in the upper right side of your belly. Pain might spread to your right shoulder or your chest. If this happens, contact your doctor.  If you have sudden pain in the upper right side of your belly that lasts for more than 2 hours, get help right away. This information is not intended to replace advice given to you by your health care provider. Make sure you discuss any questions you have with your health care provider. Document Released: 06/10/2007 Document Revised: 09/08/2015 Document Reviewed: 09/08/2015 Elsevier Interactive Patient Education  2017 [place] gallbladder is a calcification of the gallbladder believed to be brought on by excessive gallstones, although the exact cause is not clear. As with gallstone disease in general, this condition occurs predominantly in overweight female patients of middle age. It is a morphological variant of chronic cholecystitis.   In the past this was thought to be related to gallbladder cancer, but new research has proven that the risk of cancer with porcelain gallbladder is a lot lower than previously thought, and likely <2-3%.

## 2017-02-04 NOTE — Progress Notes (Signed)
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 Karie Watkins   Chief Complaint    Other      Donna Watkins is a 57 y.o. female.  HPI: Donna Watkins is a 57 yo with COPD/ emphysema who is on 2L at rest and 3L at baseline who has had 30 lbs of weight loss in the last year, and Dr. Karie Watkins sent her for her normal CT chest to evaluate for lung cancer and added on a CT a/p given the weight loss. She denies any abdominal pain, blood per rectum, or nausea/vomiting. She says that for the last year she is sleeping during the day and up all night. She does not want to eat, and as no appetite. Her poor dentition also prevents her from eating some.  She says that she had a colonoscopy about 5 yrs ago and is due in another 5 yrs. She has not had a Ob Gyn visit with pap smear in over 10 years.  She says she was told she had a porcelain gallbladder but before then would not have known anything about her gallbladder and has never experienced any symptoms of pain with eating, etc.   Past Medical History:  Diagnosis Date  . Anxiety    panic attacks  . COPD (chronic obstructive pulmonary disease) ([place]) 03/01/2012  . Depression   . GERD (gastroesophageal reflux disease)   . Hyperlipidemia   . Oxygen dependent     Past Surgical History:  Procedure Laterality Date  . COLONOSCOPY N/A 09/09/2012   Procedure: COLONOSCOPY;  Surgeon: Donna Binder, MD;  Location: [facility];  Service: Endoscopy;  Laterality: N/A;  1:15-moved to 12:45 Donna Watkins notified pt  . TUBAL LIGATION      Family History  Problem Relation Age of Onset  . COPD Mother   . Heart disease Father   . Colon cancer Neg Hx     Social History   Tobacco Use  . Smoking status: Former Smoker    Packs/day: 2.50    Years: 30.00    Pack years: 75.00    Types: Cigarettes    Last attempt to quit: 06/12/2011    Years since quitting: 5.6  . Smokeless tobacco: Never Used    Substance Use Topics  . Alcohol use: No  . Drug use: No    Medications: I have reviewed the patient's current medications. Allergies as of 02/04/2017   No Known Allergies     Medication List        Accurate as of 02/04/17  4:18 PM. Always use your most recent med list.          albuterol 108 (90 Base) MCG/ACT inhaler Commonly known as:  PROVENTIL HFA;VENTOLIN HFA Inhale 2 puffs into the lungs every 6 (six) hours as needed. For shortness of breath   albuterol (2.5 MG/3ML) 0.083% nebulizer solution Commonly known as:  PROVENTIL Take 2.5 mg by nebulization every 4 (four) hours as needed. For shortness of breath   ALPRAZolam 1 MG tablet Commonly known as:  XANAX Take 1 mg by mouth 4 (four) times daily as needed. For anxiety   aspirin 81 MG tablet Take 81 mg by mouth daily.   atorvastatin 10 MG tablet Commonly known as:  LIPITOR Take 1 tablet by mouth at bedtime.   ipratropium 0.02 % nebulizer solution Commonly known as:  ATROVENT Take 2.5 mLs (500 mcg total) by nebulization 4 (four) times daily as needed. For shortness  of breath   omeprazole 20 MG capsule Commonly known as:  PRILOSEC Take 20 mg by mouth daily.        ROS:  A comprehensive review of systems was negative except for: Gastrointestinal: positive for reflux symptoms and heartburn  Blood pressure 106/60, pulse 89, temperature 98.4 F (36.9 C), resp. rate 18, height 5\' 3"  (1.6 m), weight 130 lb (59 kg), last menstrual period 05/06/2010. Physical Exam  Constitutional: She is oriented to person, place, and time. She appears cachectic. No distress.  Looks frail, oxygen in place  HENT:  Head: Normocephalic.  Poor dentition  Eyes: Pupils are equal, round, and reactive to light.  Neck: Normal range of motion.  Cardiovascular: Normal rate and regular rhythm.  Pulmonary/Chest: Effort normal and breath sounds normal.  Abdominal: Soft. She exhibits no distension. There is no tenderness.  Musculoskeletal:  Normal range of motion.  Neurological: She is alert and oriented to person, place, and time.  Skin: Skin is warm and dry.  Psychiatric: Mood, memory, affect and judgment normal.    Results: CT a/p 11/2016 - reviewed personally,gallbladder with some calcified wall, CT from 2013 with some calcification also, slightly more in 2018 Also reviewed the CTs with Dr. Thornton Watkins radiology who agrees   IMPRESSION: 1. No acute abnormality.  No abdominal lymphadenopathy. 2. Porcelain gallbladder. 3.  Emphysema (ICD10-J43.9).  Assessment & Plan:  Donna Watkins is a 57 y.o. 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 She has no symptoms of gallbladder disease and has no complaints. Her biggest issues that prompted the CT was weight loss.  She has been up to date with her colonoscopy, but is due for a pap smear per her report.  She has significant pulmonary issues, and is on 2L at rest and 3L with activity. Undergoing a surgery and having general anesthesia and a breathing tube is not without its risk.    -We have discussed that porcelain gallbladder is a description for calcifications in the gallbladder wall. This is normally just related to chronic inflammation, but can rarely be associated with gallbladder cancers. In the past, we took gallbladders out because of the theoretical risk, but more research has shown that this risk is lower than initial thought < 2-3 %.   -We discussed that if she was completely healthy that we take out the gallbladder due to the chance of the cancer and the severity of the cancer, but in her case that answer is not the easiest because any surgery is more risky with her lung disease.   -I reviewed the imaging with Radiology, and they agree there is calcifications in 2013 and they are only minimally increased in 2018. -She should get a repeat CT a/p with IV contrast and should be NPO prior to the CT for surviellance 6 months from the CT a/p, so  05/2017 -I will discuss with Dr. Karie Watkins so he can arrange the CT. If no changes, would recommend an additional repeat 11/2017 and then one again 11/2018, and if no interval changes, it would be unlikely for any cancer to be harbored in the calcifications. -I have given the patient information on gallbladder disease, pending her starting to have any symptoms  -Follow up PRN   All questions were answered to the satisfaction of the patient and family.  In total, I spent 45 minutes explaining the information to the patient and reviewing the images and coordinating the care, and over 50% of that time was spent with the patient. Marland Kitchen  Virl Cagey 02/04/2017, 4:18 PM

## 2017-02-05 ENCOUNTER — Telehealth: Payer: Self-pay | Admitting: General Surgery

## 2017-02-05 NOTE — Telephone Encounter (Signed)
Called Dr. Karie Kirks with regards to plan for patient.  Calcifications present in gallbladder in 2013 and they are only minimally increased in 2018.  Patient should get a repeat CT a/p with IV contrast and should be NPO prior to the CT for surviellance 6 months from the CT a/p, so 05/2017.  If no changes, would recommend an additional repeat 11/2017 and then one again 11/2018, and if no interval changes, it would be unlikely for any cancer to be harbored in the calcifications.  He is in agreement and will order the scans.  Curlene Labrum, MD [facility] [address] [place], [place] [address] [phone] (office)

## 2017-05-26 ENCOUNTER — Other Ambulatory Visit (HOSPITAL_COMMUNITY): Payer: Self-pay | Admitting: Family Medicine

## 2017-05-26 DIAGNOSIS — R1011 Right upper quadrant pain: Secondary | ICD-10-CM

## 2017-05-26 DIAGNOSIS — K8020 Calculus of gallbladder without cholecystitis without obstruction: Secondary | ICD-10-CM

## 2017-05-28 ENCOUNTER — Ambulatory Visit (HOSPITAL_COMMUNITY): Admission: RE | Admit: 2017-05-28 | Payer: Medicare Other | Source: Ambulatory Visit

## 2017-06-02 ENCOUNTER — Ambulatory Visit (HOSPITAL_COMMUNITY)
Admission: RE | Admit: 2017-06-02 | Discharge: 2017-06-02 | Disposition: A | Discharge status: Home / Self Care | Payer: Medicare Other | Source: Ambulatory Visit | Attending: Family Medicine | Admitting: Family Medicine

## 2017-06-02 DIAGNOSIS — K8020 Calculus of gallbladder without cholecystitis without obstruction: Secondary | ICD-10-CM | POA: Diagnosis present

## 2017-06-02 DIAGNOSIS — K760 Fatty (change of) liver, not elsewhere classified: Secondary | ICD-10-CM | POA: Insufficient documentation

## 2017-06-11 ENCOUNTER — Other Ambulatory Visit (HOSPITAL_COMMUNITY): Payer: Self-pay | Admitting: Family Medicine

## 2017-06-11 DIAGNOSIS — K8020 Calculus of gallbladder without cholecystitis without obstruction: Secondary | ICD-10-CM

## 2017-06-25 ENCOUNTER — Ambulatory Visit (HOSPITAL_COMMUNITY)
Admission: RE | Admit: 2017-06-25 | Discharge: 2017-06-25 | Disposition: A | Discharge status: Home / Self Care | Payer: Medicare Other | Source: Ambulatory Visit | Attending: Family Medicine | Admitting: Family Medicine

## 2017-06-25 DIAGNOSIS — K8020 Calculus of gallbladder without cholecystitis without obstruction: Secondary | ICD-10-CM | POA: Diagnosis present

## 2017-06-25 DIAGNOSIS — K828 Other specified diseases of gallbladder: Secondary | ICD-10-CM | POA: Diagnosis not present

## 2017-06-25 MED ORDER — IOPAMIDOL (ISOVUE-300) INJECTION 61%
100.0000 mL | Freq: Once | INTRAVENOUS | Status: AC | PRN
  Administered 2017-06-25: 100 mL via INTRAVENOUS

## 2017-07-02 ENCOUNTER — Encounter: Payer: Self-pay | Admitting: Internal Medicine

## 2017-07-02 ENCOUNTER — Ambulatory Visit (INDEPENDENT_AMBULATORY_CARE_PROVIDER_SITE_OTHER): Payer: Medicare Other | Admitting: Internal Medicine

## 2017-07-02 VITALS — BP 90/60 | HR 70 | Ht 63.0 in | Wt 128.6 lb

## 2017-07-02 DIAGNOSIS — J449 Chronic obstructive pulmonary disease, unspecified: Secondary | ICD-10-CM | POA: Diagnosis not present

## 2017-07-02 DIAGNOSIS — J9611 Chronic respiratory failure with hypoxia: Secondary | ICD-10-CM | POA: Diagnosis not present

## 2017-07-02 DIAGNOSIS — J9612 Chronic respiratory failure with hypercapnia: Secondary | ICD-10-CM

## 2017-07-02 MED ORDER — GLYCOPYRROLATE-FORMOTEROL 9-4.8 MCG/ACT IN AERO: 2.0000 | INHALATION_SPRAY | Freq: Two times a day (BID) | RESPIRATORY_TRACT | 11 refills | Status: DC

## 2017-07-02 MED ORDER — GLYCOPYRROLATE-FORMOTEROL 9-4.8 MCG/ACT IN AERO: 2.0000 | INHALATION_SPRAY | Freq: Two times a day (BID) | RESPIRATORY_TRACT | 0 refills | Status: DC

## 2017-07-02 NOTE — Patient Instructions (Addendum)
Plan A = Automatic = Bevespi Take 2 puffs first thing in am and then another 2 puffs about 12 hours later.   Plan B = Backup Only use your albuterol as a rescue medication to be used if you can't catch your breath by resting or doing a relaxed purse lip breathing pattern.  - The less you use it, the better it will work when you need it. - Ok to use the inhaler up to 2 puffs  every 4 hours if you must but call for appointment if use goes up over your usual need - Don't leave home without it !!  (think of it like the spare tire for your car)   Plan C = Crisis - only use your albuterol (no ipatropium)  nebulizer if you first try Plan B and it fails to help > ok to use the nebulizer up to every 4 hours but if start needing it regularly call for immediate appointment     Please schedule a follow up visit in 6 months but call sooner if needed

## 2017-07-02 NOTE — Progress Notes (Signed)
Subjective:    Patient ID: Donna Watkins, female    DOB: Nov 27, 1960,    MRN: [id number]    Brief patient profile:  72 yowm quit smoking in 2013 with severe copd at baseline last seen in pulmonary clinic  by [facility] 11/2013  GOLD IV criteria with PFTs 05/2012 ith fev1 only 26%, diffusion at 52%    History of Present Illness  07/31/2016 1st office visit/ Donna Watkins   GOLD IV copd/ 02 2lpm 24/7  Chief Complaint  Patient presents with  . Pulmonary Consult    Pt is self referred for COPD. Pt c/o DOE, throat clearing. Pt denies CP/tightness anf recentl f/c/s. Pt states she rarely uses albuterol hfa.   doe -  turns up 3lpm walking but still doe x 50 ft Use scooter at [employer] x 5 years  Sleeps on 4lpm / no am ha / cough or congestion  Uses neb avg bid, very poor  insight into meds/02  Some better breathing p neb saba rec Please remember to go to the lab and x-ray department downstairs in the basement  for your tests - we will call you with the results when they are available. Call us if you want to be referred to [facility] for pulmonary rehab - in meantime continue 2lpm at rest and 3lpm with activity    Please schedule a follow up visit in 3 months but call sooner if needed with full pfts on return      12/31/2016  f/u ov/Donna Watkins re:  2lpm 24/7  Chief Complaint  Patient presents with  . Follow-up    review PFT results. States breathing has been ok since last visit.    breathing ok at rest but can't walk 300 ft even on up to 3lpm s sob on flat surface slow pace  rec Stay on alb/ipatropium up to 4 x daily      07/02/2017  f/u ov/Donna Watkins re: GOLD IV  / 02 24/7  Chief Complaint  Patient presents with  . Follow-up    Breathing is overall doing well overall. She is using her albuterol inhaler once per wk on average. She uses her albuterol and atrovent 2 x daily on average. She states she has been losing wt unintentionally.    Dyspnea:  Not usually walking more than a few hundred  feet on 3lpm / still using scooter at [employer] Cough: not now Sleeping: insomnia no resp complaints  SABA use: about twice dialy duoneb  02: 2lpm x 3lpm walking    No obvious day to day or daytime variability or assoc excess/ purulent sputum or mucus plugs or hemoptysis or cp or chest tightness, subjective wheeze or overt sinus or hb symptoms.   Sleeping flat  without nocturnal  or early am exacerbation  of respiratory  c/o's or need for noct saba. Also denies any obvious fluctuation of symptoms with weather or environmental changes or other aggravating or alleviating factors except as outlined above   No unusual exposure hx or h/o childhood pna/ asthma or knowledge of premature birth.  Current Allergies, Complete Past Medical History, Past Surgical History, Family History, and Social History were reviewed in [employer] record.  ROS  The following are not active complaints unless bolded Hoarseness, sore throat, dysphagia, dental problems, itching, sneezing,  nasal congestion or discharge of excess mucus or purulent secretions, ear ache,   fever, chills, sweats, unintended wt loss or wt gain, classically pleuritic  or exertional cp,  orthopnea pnd or arm/hand swelling  or leg swelling, presyncope, palpitations, abdominal pain, anorexia, nausea, vomiting, diarrhea  or change in bowel habits or change in bladder habits, change in stools or change in urine, dysuria, hematuria,  rash, arthralgias, visual complaints, headache, numbness, weakness or ataxia or problems with walking or coordination,  change in mood or  memory.        Current Meds  Medication Sig  . albuterol (PROVENTIL HFA;VENTOLIN HFA) 108 (90 BASE) MCG/ACT inhaler Inhale 2 puffs into the lungs every 6 (six) hours as needed. For shortness of breath  . albuterol (PROVENTIL) (2.5 MG/3ML) 0.083% nebulizer solution Take 2.5 mg by nebulization every 4 (four) hours as needed. For shortness of breath  . ALPRAZolam  (XANAX) 1 MG tablet Take 1 mg by mouth 4 (four) times daily as needed. For anxiety  . aspirin 81 MG tablet Take 81 mg by mouth daily.  Marland Kitchen atorvastatin (LIPITOR) 10 MG tablet Take 1 tablet by mouth at bedtime.  . diphenhydrAMINE HCl, Sleep, (ZZZQUIL PO) Take by mouth as needed.  Marland Kitchen omeprazole (PRILOSEC) 20 MG capsule Take 20 mg by mouth daily.  . OXYGEN 2lpm 24/7  AHC  . traZODone (DESYREL) 50 MG tablet Take 1 tablet by mouth daily.  . [   ipratropium (ATROVENT) 0.02 % nebulizer solution Take 2.5 mLs (500 mcg total) by nebulization 4 (four) times daily as needed. For shortness of breath                       Objective:   Physical Exam   Chronically ill thin w/c bound wf nad  07/02/2017        128 12/31/2016      136   07/31/16 145 lb 9.6 oz (66 kg)  01/22/14 180 lb (81.6 kg)  05/23/13 180 lb 12.8 oz (82 kg)    Vital signs reviewed - Note on arrival 02 sats  96% on  2lpm continuous     HEENT: nl   oropharynx. Nl external ear canals without cough reflex - mild bilateral non-specific turbinate edema / very poor dentition     NECK :  without JVD/Nodes/TM/ nl carotid upstrokes bilaterally   LUNGS: no acc muscle use,  Mod barrel  contour chest wall with bilateral  Distant bs s audible wheeze and  without cough on insp or exp maneuver and mod   Hyperresonant  to  percussion bilaterally     CV:  RRR  no s3 or murmur or increase in P2, and no edema   ABD:  soft and nontender with pos mid insp Hoover's  in the supine position. No bruits or organomegaly appreciated, bowel sounds nl  MS:      ext warm without deformities, calf tenderness, cyanosis or clubbing No obvious joint restrictions / Pos muscle wasting diffusely   SKIN: warm and dry without lesions    NEURO:  alert, approp, nl sensorium with  no motor or cerebellar deficits apparent.                Assessment & Plan:

## 2017-07-03 ENCOUNTER — Encounter: Payer: Self-pay | Admitting: Internal Medicine

## 2017-07-03 NOTE — Assessment & Plan Note (Signed)
07/31/2016  Patient Saturations on Room Air at Rest = 88% and while Ambulating = 83% - HC03  07/31/2016  = 38  - 07/31/2016   Walked 3lpm  2 laps @ 185 ft each stopped due to  Sob at moderate pace with no desat    Well compensated on present rx = 2lpm except 3lpm walking    NB Though somewhat paradoxic, when the lung fails to clear C02 properly and pC02 rises the lung then becomes a more efficient scavenger of C02 allowing lower work of breathing and  better C02 clearance albeit at a higher serum pC02 level - this is why pts can look a lot better than their ABG's would suggest and why it's so difficult to prognosticate endstage dz.  It's also why I strongly rec DNI status (ventilating pts down to a nl pC02 adversely affects this compensatory mechanism)

## 2017-07-03 NOTE — Assessment & Plan Note (Signed)
Quit smoking 2013 - PFT's  04/27/12  FEV1 0.70 (26 %) ratio 35  p no % improvement from saba p ? prior to study with DLCO  53 % corrects to 69 % for alv volume with classic curvature   - Alpha one AT screening   07/31/16 >   MM - PFT's  12/31/2016  FEV1 0.74 (28 % ) ratio 38  p no % improvement from saba p saba 4h   prior to study with DLCO  37/38c % corrects to 50 % for alv volume   - 07/02/2017  After extensive coaching inhaler device  effectiveness =    90%> try bevespi  Pt is Group B in terms of symptom/risk and laba/lama therefore appropriate rx at this point and would likely benefit from bevespi and reduce reliance on sama/saba if insurance covers   Formulary restrictions will be an ongoing challenge for the forseable future and I would be happy to pick an alternative if the pt will first  provide me a list of them but pt  will need to return here for training for any new device that is required eg dpi vs hfa vs respimat.    In meantime we can always provide samples so the patient never runs out of any needed respiratory medications.   I had an extended discussion with the patient reviewing all relevant studies completed to date and  lasting 15 to 20 minutes of a 25 minute visit    See device teaching which extended face to face time for this visit.  Each maintenance medication was reviewed in detail including emphasizing most importantly the difference between maintenance and prns and under what circumstances the prns are to be triggered using an action plan format that is not reflected in the computer generated alphabetically organized AVS which I have not found useful in most complex patients, especially with respiratory illnesses  Please see AVS for specific instructions unique to this visit that I personally wrote and verbalized to the the pt in detail and then reviewed with pt  by my nurse highlighting any  changes in therapy recommended at today's visit to their plan of care.

## 2017-07-12 ENCOUNTER — Other Ambulatory Visit (HOSPITAL_COMMUNITY): Payer: Self-pay | Admitting: Family Medicine

## 2017-07-13 ENCOUNTER — Other Ambulatory Visit (HOSPITAL_COMMUNITY): Payer: Self-pay | Admitting: Family Medicine

## 2017-07-13 DIAGNOSIS — K828 Other specified diseases of gallbladder: Secondary | ICD-10-CM

## 2017-07-15 ENCOUNTER — Ambulatory Visit (HOSPITAL_COMMUNITY)
Admission: RE | Admit: 2017-07-15 | Discharge: 2017-07-15 | Disposition: A | Discharge status: Home / Self Care | Payer: Medicare Other | Source: Ambulatory Visit | Attending: Family Medicine | Admitting: Family Medicine

## 2017-07-15 DIAGNOSIS — K828 Other specified diseases of gallbladder: Secondary | ICD-10-CM

## 2017-07-21 ENCOUNTER — Ambulatory Visit (HOSPITAL_COMMUNITY)
Admission: RE | Admit: 2017-07-21 | Discharge: 2017-07-21 | Disposition: A | Discharge status: Home / Self Care | Payer: Medicare Other | Source: Ambulatory Visit | Attending: Family Medicine | Admitting: Family Medicine

## 2017-07-21 ENCOUNTER — Other Ambulatory Visit (HOSPITAL_COMMUNITY): Payer: Self-pay | Admitting: Family Medicine

## 2017-07-21 DIAGNOSIS — K828 Other specified diseases of gallbladder: Secondary | ICD-10-CM

## 2017-08-09 ENCOUNTER — Other Ambulatory Visit (HOSPITAL_COMMUNITY): Payer: Self-pay | Admitting: Family Medicine

## 2017-08-09 DIAGNOSIS — Z1231 Encounter for screening mammogram for malignant neoplasm of breast: Secondary | ICD-10-CM

## 2017-08-23 ENCOUNTER — Ambulatory Visit (HOSPITAL_COMMUNITY)
Admission: RE | Admit: 2017-08-23 | Discharge: 2017-08-23 | Disposition: A | Discharge status: Home / Self Care | Payer: Medicare Other | Source: Ambulatory Visit | Attending: Family Medicine | Admitting: Family Medicine

## 2017-08-23 DIAGNOSIS — Z1231 Encounter for screening mammogram for malignant neoplasm of breast: Secondary | ICD-10-CM | POA: Diagnosis not present

## 2017-12-06 ENCOUNTER — Ambulatory Visit: Payer: Medicare Other | Admitting: Internal Medicine

## 2017-12-16 ENCOUNTER — Ambulatory Visit: Payer: Medicare Other | Admitting: Internal Medicine

## 2018-01-03 ENCOUNTER — Ambulatory Visit (INDEPENDENT_AMBULATORY_CARE_PROVIDER_SITE_OTHER): Payer: Medicare Other | Admitting: Internal Medicine

## 2018-01-03 ENCOUNTER — Ambulatory Visit: Payer: Medicare Other | Admitting: Internal Medicine

## 2018-01-03 ENCOUNTER — Encounter: Payer: Self-pay | Admitting: Internal Medicine

## 2018-01-03 VITALS — BP 96/56 | HR 100 | Ht 63.0 in | Wt 126.0 lb

## 2018-01-03 DIAGNOSIS — J9611 Chronic respiratory failure with hypoxia: Secondary | ICD-10-CM

## 2018-01-03 DIAGNOSIS — J9612 Chronic respiratory failure with hypercapnia: Secondary | ICD-10-CM | POA: Diagnosis not present

## 2018-01-03 DIAGNOSIS — J449 Chronic obstructive pulmonary disease, unspecified: Secondary | ICD-10-CM

## 2018-01-03 MED ORDER — GLYCOPYRROLATE-FORMOTEROL 9-4.8 MCG/ACT IN AERO: 2.0000 | INHALATION_SPRAY | Freq: Two times a day (BID) | RESPIRATORY_TRACT | 11 refills | Status: DC

## 2018-01-03 MED ORDER — GLYCOPYRROLATE-FORMOTEROL 9-4.8 MCG/ACT IN AERO: 2.0000 | INHALATION_SPRAY | Freq: Two times a day (BID) | RESPIRATORY_TRACT | 0 refills | Status: DC

## 2018-01-03 NOTE — Progress Notes (Signed)
Subjective:    Patient ID: Donna Watkins, female    DOB: 1960-02-04,    MRN: [id number]    Brief patient profile:  68 yowm quit smoking in 2013 with severe copd at baseline last seen in pulmonary clinic  by [facility] 11/2013  GOLD IV criteria with PFTs 05/2012 ith fev1 only 26%, diffusion at 52%    History of Present Illness  07/31/2016 1st office visit/ Donna Watkins   GOLD IV copd/ 02 2lpm 24/7  Chief Complaint  Patient presents with  . Pulmonary Consult    Pt is self referred for COPD. Pt c/o DOE, throat clearing. Pt denies CP/tightness anf recentl f/c/s. Pt states she rarely uses albuterol hfa.   doe -  turns up 3lpm walking but still doe x 50 ft Use scooter at [employer] x 5 years  Sleeps on 4lpm / no am ha / cough or congestion  Uses neb avg bid, very poor  insight into meds/02  Some better breathing p neb saba rec Please remember to go to the lab and x-ray department downstairs in the basement  for your tests - we will call you with the results when they are available. Call us if you want to be referred to [facility] for pulmonary rehab - in meantime continue 2lpm at rest and 3lpm with activity    Please schedule a follow up visit in 3 months but call sooner if needed with full pfts on return      12/31/2016  f/u ov/Donna Watkins re:  2lpm 24/7  Chief Complaint  Patient presents with  . Follow-up    review PFT results. States breathing has been ok since last visit.    breathing ok at rest but can't walk 300 ft even on up to 3lpm s sob on flat surface slow pace  rec Stay on alb/ipatropium up to 4 x daily      07/02/2017  f/u ov/Donna Watkins re: GOLD IV  / 02 24/7  Chief Complaint  Patient presents with  . Follow-up    Breathing is overall doing well overall. She is using her albuterol inhaler once per wk on average. She uses her albuterol and atrovent 2 x daily on average. She states she has been losing wt unintentionally.    Dyspnea:  Not usually walking more than a few hundred  feet on 3lpm / still using scooter at [employer] Cough: not now Sleeping: insomnia no resp complaints  SABA use: about twice dialy duoneb  02: 2lpm x 3lpm walking   rec Plan A = Automatic = Bevespi Take 2 puffs first thing in am and then another 2 puffs about 12 hours later.  Plan B = Backup Only use your albuterol as a rescue medication Plan C = Crisis - only use your albuterol (no ipatropium)  nebulizer if you first try Plan B and it fails to help > ok to use the nebulizer up to every 4 hours but if start needing it regularly call for immediate appointment     01/03/2018  f/u ov/Donna Watkins re: copd iv/ 02 dep and wants to change DME company Chief Complaint  Patient presents with  . Follow-up    Breathing is unchanged. She is using her albuterol inhaler 1 to 2 x per wk and she uses her neb at least 2 x daily.   Dyspnea:  MMRC3 = can't walk 100 yards even at a slow pace at a flat grade s stopping due to  sob  = uses scooter for [employer]/ has already done rehab Cough: none Sleeping: 2 pillow/ flat bed  SABA use: twice daily neb none noct/ ? Some better on bevespi but did not continue it 02: 3lpm at home  / 2lpm continuous   No obvious day to day or daytime variability or assoc excess/ purulent sputum or mucus plugs or hemoptysis or cp or chest tightness, subjective wheeze or overt sinus or hb symptoms.   sleeping as above without nocturnal  or early am exacerbation  of respiratory  c/o's or need for noct saba. Also denies any obvious fluctuation of symptoms with weather or environmental changes or other aggravating or alleviating factors except as outlined above   No unusual exposure hx or h/o childhood pna/ asthma or knowledge of premature birth.  Current Allergies, Complete Past Medical History, Past Surgical History, Family History, and Social History were reviewed in [employer] record.  ROS  The following are not active complaints unless bolded Hoarseness,  sore throat, dysphagia, dental problems, itching, sneezing,  nasal congestion or discharge of excess mucus or purulent secretions, ear ache,   fever, chills, sweats, unintended wt loss or wt gain, classically pleuritic or exertional cp,  orthopnea pnd or arm/hand swelling  or leg swelling, presyncope, palpitations, abdominal pain, anorexia, nausea, vomiting, diarrhea  or change in bowel habits or change in bladder habits, change in stools or change in urine, dysuria, hematuria,  rash, arthralgias, visual complaints, headache, numbness, weakness or ataxia or problems with walking or coordination,  change in mood= anxious or  memory.        Current Meds  Medication Sig  . albuterol (PROVENTIL HFA;VENTOLIN HFA) 108 (90 BASE) MCG/ACT inhaler Inhale 2 puffs into the lungs every 6 (six) hours as needed. For shortness of breath  . albuterol (PROVENTIL) (2.5 MG/3ML) 0.083% nebulizer solution Take 2.5 mg by nebulization every 4 (four) hours as needed. For shortness of breath  . ALPRAZolam (XANAX) 1 MG tablet Take 1 mg by mouth 4 (four) times daily as needed. For anxiety  . aspirin 81 MG tablet Take 81 mg by mouth daily.  Marland Kitchen atorvastatin (LIPITOR) 10 MG tablet Take 1 tablet by mouth at bedtime.  . diphenhydrAMINE HCl, Sleep, (ZZZQUIL PO) Take by mouth as needed.  Marland Kitchen omeprazole (PRILOSEC) 20 MG capsule Take 20 mg by mouth daily.  . OXYGEN 2lpm 24/7  AHC  . traZODone (DESYREL) 50 MG tablet Take 1 tablet by mouth daily.                Objective:   Physical Exam  Chronically ill thin wf nad   01/03/2018      126  07/02/2017        128 12/31/2016      136   07/31/16 145 lb 9.6 oz (66 kg)  01/22/14 180 lb (81.6 kg)  05/23/13 180 lb 12.8 oz (82 kg)      Wt Readings from Last 3 Encounters:  01/03/18 126 lb (57.2 kg)  07/02/17 128 lb 9.6 oz (58.3 kg)  02/04/17 130 lb (59 kg)     Vital signs reviewed - Note on arrival 02 sats  85% on RA        HEENT: Poor dentition / nl oropharynx. Nl  external ear canals without cough reflex -  Mild bilateral non-specific turbinate edema     NECK :  without JVD/Nodes/TM/ nl carotid upstrokes bilaterally   LUNGS: no acc muscle use,  Mod  barrel  contour chest wall with bilateral  Distant bs s audible wheeze and  without cough on insp or exp maneuver and mod  Hyperresonant  to  percussion bilaterally     CV:  RRR  no s3 or murmur or increase in P2, and no edema   ABD:  soft and nontender with pos mid insp Hoover's  in the supine position. No bruits or organomegaly appreciated, bowel sounds nl  MS:   Nl gait/  ext warm without deformities, calf tenderness, cyanosis or clubbing No obvious joint restrictions   SKIN: warm and dry without lesions    NEURO:  alert, approp, nl sensorium with  no motor or cerebellar deficits apparent.                Assessment & Plan:

## 2018-01-03 NOTE — Patient Instructions (Addendum)
Plan A = Automatic = Bevespi Take 2 puffs first thing in am and then another 2 puffs about 12 hours later.   Work on inhaler technique:  relax and gently blow all the way out then take a nice smooth deep breath back in, triggering the inhaler at same time you start breathing in.  Hold for up to 5 seconds if you can. Blow out thru nose. Rinse and gargle with water when done     Plan B = Backup Only use your albuterol as a rescue medication to be used if you can't catch your breath by resting or doing a relaxed purse lip breathing pattern.  - The less you use it, the better it will work when you need it. - Ok to use the inhaler up to 2 puffs  every 4 hours if you must but call for appointment if use goes up over your usual need - Don't leave home without it !!  (think of it like the spare tire for your car)   Plan C = Crisis - only use your albuterol (no ipatropium)  nebulizer if you first try Plan B and it fails to help > ok to use the nebulizer up to every 4 hours but if start needing it regularly call for immediate appointment    02 is 2lpm 24/7 but ok to adjust to keep over 90% with activity if needed  Please schedule a follow up visit in 6 months but call sooner if needed

## 2018-01-04 ENCOUNTER — Encounter: Payer: Self-pay | Admitting: Internal Medicine

## 2018-01-04 NOTE — Assessment & Plan Note (Signed)
Quit smoking 2013 - PFT's  04/27/12  FEV1 0.70 (26 %) ratio 35  p no % improvement from saba p ? prior to study with DLCO  53 % corrects to 69 % for alv volume with classic curvature   - Alpha one AT screening   07/31/16 >   MM - PFT's  12/31/2016  FEV1 0.74 (28 % ) ratio 38  p no % improvement from saba p saba 4h   prior to study with DLCO  37/38c % corrects to 50 % for alv volume   - 07/02/2017   try bevespi: did not use correctly   - 01/03/2018  After extensive coaching inhaler device,  effectiveness =    75% try bevespi again   Misunderstood last instructions and was waiting for new insurance anyway before changing to bevesop but Pt is Group B in terms of symptom/risk and laba/lama therefore appropriate rx at this point.     Formulary restrictions will be an ongoing challenge for the forseable future and I would be happy to pick an alternative if the pt will first  provide me a list of them but pt  will need to return here for training for any new device that is required eg dpi vs hfa vs respimat.    In meantime we can always provide samples so the patient never runs out of any needed respiratory medications.

## 2018-01-04 NOTE — Assessment & Plan Note (Signed)
07/31/2016  Patient Saturations on Room Air at Rest = 88% and while Ambulating = 83% - HC03  07/31/2016  = 38  - 07/31/2016   Walked 3lpm  2 laps @ 185 ft each stopped due to  Sob at moderate pace with no desat   - RA  = 85% 01/03/2018 corrected to 92 on 2lpm continuous   For now rec 2lpm at "floor" with goal of keeping sats > 90% (but not ideally above 95% due to hypercarbia) with titration if needed with activity.   I had an extended discussion with the patient and fm reviewing all relevant studies completed to date and  lasting 15 to 20 minutes of a 25 minute visit    See device teaching which extended face to face time for this visit as did re- qualification for 02 from different dme based on observed 02 sat study at rest off 02   Each maintenance medication was reviewed in detail including emphasizing most importantly the difference between maintenance and prns and under what circumstances the prns are to be triggered using an action plan format that is not reflected in the computer generated alphabetically organized AVS which I have not found useful in most complex patients, especially with respiratory illnesses  Please see AVS for specific instructions unique to this visit that I personally wrote and verbalized to the the pt in detail and then reviewed with pt  by my nurse highlighting any  changes in therapy recommended at today's visit to their plan of care.

## 2018-01-12 ENCOUNTER — Telehealth: Payer: Self-pay | Admitting: Internal Medicine

## 2018-01-12 DIAGNOSIS — J9611 Chronic respiratory failure with hypoxia: Secondary | ICD-10-CM

## 2018-01-12 DIAGNOSIS — J9612 Chronic respiratory failure with hypercapnia: Principal | ICD-10-CM

## 2018-01-12 NOTE — Telephone Encounter (Signed)
Called and spoke with patient, she is aware and verbalized understanding. Nothing further needed. Order placed.

## 2018-01-12 NOTE — Telephone Encounter (Signed)
Called and spoke with patient she stated that her [facility] office in [place] that she uses is closing. She is going to be using the one in [place] for her supplies. She is needing more portable tanks and they requested that we send in an order for more tanks. MW please advise, thank you.   While on the phone patient also stated that the bevespi did not work for her. Please advise.

## 2018-01-12 NOTE — Telephone Encounter (Signed)
Ok to leave off bevespi and resume whatever she was previously doing pending f/u ov here - ok to move up if not doing well   Fine to order more tanks

## 2018-01-17 ENCOUNTER — Telehealth: Payer: Self-pay | Admitting: Internal Medicine

## 2018-01-17 DIAGNOSIS — J449 Chronic obstructive pulmonary disease, unspecified: Secondary | ICD-10-CM

## 2018-01-17 DIAGNOSIS — J9611 Chronic respiratory failure with hypoxia: Secondary | ICD-10-CM

## 2018-01-17 DIAGNOSIS — J9612 Chronic respiratory failure with hypercapnia: Principal | ICD-10-CM

## 2018-01-17 NOTE — Telephone Encounter (Signed)
Called and spoke with patient regarding her O2 concerns and questions. Pt states that [facility] in [place] is closing soon and will not deliver to her home anymore Pt spoke with [facility] in [place], [place], in order to delivery to her home they need updated order Pt is requesting no homefill anymore, pt would like more E Tanks deliveried by [facility] Leaving message in triage to follow up tomorrow.  Pt verbalized understanding that [facility] is closed as it is after 5pm.  MW are you okay with placing her O2 order to [facility] for E Tanks for this patient. Thank you.

## 2018-01-17 NOTE — Telephone Encounter (Signed)
Ok with me 

## 2018-01-17 NOTE — Telephone Encounter (Signed)
Order placed for pt's O2 needs. Nothing further needed.

## 2018-07-04 ENCOUNTER — Encounter: Payer: Self-pay | Admitting: Internal Medicine

## 2018-07-04 ENCOUNTER — Other Ambulatory Visit: Payer: Self-pay

## 2018-07-04 ENCOUNTER — Ambulatory Visit (INDEPENDENT_AMBULATORY_CARE_PROVIDER_SITE_OTHER): Payer: Medicare Other | Admitting: Internal Medicine

## 2018-07-04 DIAGNOSIS — J449 Chronic obstructive pulmonary disease, unspecified: Secondary | ICD-10-CM | POA: Diagnosis not present

## 2018-07-04 DIAGNOSIS — J9612 Chronic respiratory failure with hypercapnia: Secondary | ICD-10-CM

## 2018-07-04 DIAGNOSIS — J9611 Chronic respiratory failure with hypoxia: Secondary | ICD-10-CM

## 2018-07-04 MED ORDER — IPRATROPIUM-ALBUTEROL 0.5-2.5 (3) MG/3ML IN SOLN: 3.0000 mL | Freq: Three times a day (TID) | RESPIRATORY_TRACT | Status: DC

## 2018-07-04 NOTE — Progress Notes (Signed)
Subjective:    Patient ID: Donna Watkins, female    DOB: 1960/02/06,    MRN: [id number]    Brief patient profile:  46 yowm quit smoking in 2013 with severe copd at baseline last seen in pulmonary clinic  by [facility] 11/2013  GOLD IV criteria with PFTs 05/2012 ith fev1 only 26%, diffusion at 52%    History of Present Illness  07/31/2016 1st office visit/ Donna Watkins   GOLD IV copd/ 02 2lpm 24/7  Chief Complaint  Patient presents with  . Pulmonary Consult    Pt is self referred for COPD. Pt c/o DOE, throat clearing. Pt denies CP/tightness anf recentl f/c/s. Pt states she rarely uses albuterol hfa.   doe -  turns up 3lpm walking but still doe x 50 ft Use scooter at [employer] x 5 years  Sleeps on 4lpm / no am ha / cough or congestion  Uses neb avg bid, very poor  insight into meds/02  Some better breathing p neb saba rec Please remember to go to the lab and x-ray department downstairs in the basement  for your tests - we will call you with the results when they are available. Call us if you want to be referred to [facility] for pulmonary rehab - in meantime continue 2lpm at rest and 3lpm with activity    Please schedule a follow up visit in 3 months but call sooner if needed with full pfts on return      12/31/2016  f/u ov/Donna Watkins re:  2lpm 24/7  Chief Complaint  Patient presents with  . Follow-up    review PFT results. States breathing has been ok since last visit.    breathing ok at rest but can't walk 300 ft even on up to 3lpm s sob on flat surface slow pace  rec Stay on alb/ipatropium up to 4 x daily      07/02/2017  f/u ov/Donna Watkins re: GOLD IV  / 02 24/7  Chief Complaint  Patient presents with  . Follow-up    Breathing is overall doing well overall. She is using her albuterol inhaler once per wk on average. She uses her albuterol and atrovent 2 x daily on average. She states she has been losing wt unintentionally.    Dyspnea:  Not usually walking more than a few hundred  feet on 3lpm / still using scooter at [place] Cough: not now Sleeping: insomnia no resp complaints  SABA use: about twice dialy duoneb  02: 2lpm x 3lpm walking   rec Plan A = Automatic = Bevespi Take 2 puffs first thing in am and then another 2 puffs about 12 hours later.  Plan B = Backup Only use your albuterol as a rescue medication Plan C = Crisis - only use your albuterol (no ipatropium)  nebulizer if you first try Plan B and it fails to help > ok to use the nebulizer up to every 4 hours but if start needing it regularly call for immediate appointment        07/04/2018  f/u ov/Donna Watkins re:  GOLD IV  / 02 24/7  3lpm at home then 2lpm cont out/ duoneb only  Chief Complaint  Patient presents with  . Follow-up    Breathing is about the same. No new co's. She rarely uses her albuterol inhaler but uses her neb 2 x daily.    Dyspnea:  Always rides scooter / room to room at home on 3lpm  Cough: no Sleeping: 2 pillows/ bed flat  SABA use: duoneb bid / can't afford bevespi and did not note change on vs off it 02: as above  No flares of copd since prior    No obvious day to day or daytime variability or assoc excess/ purulent sputum or mucus plugs or hemoptysis or cp or chest tightness, subjective wheeze or overt sinus or hb symptoms.   Sleeping as above  without nocturnal  or early am exacerbation  of respiratory  c/o's or need for noct saba. Also denies any obvious fluctuation of symptoms with weather or environmental changes or other aggravating or alleviating factors except as outlined above   No unusual exposure hx or h/o childhood pna/ asthma or knowledge of premature birth.  Current Allergies, Complete Past Medical History, Past Surgical History, Family History, and Social History were reviewed in [employer] record.  ROS  The following are not active complaints unless bolded Hoarseness, sore throat, dysphagia, dental problems, itching, sneezing,  nasal  congestion or discharge of excess mucus or purulent secretions, ear ache,   fever, chills, sweats, unintended wt loss or wt gain, classically pleuritic or exertional cp,  orthopnea pnd or arm/hand swelling  or leg swelling, presyncope, palpitations, abdominal pain, anorexia, nausea, vomiting, diarrhea  or change in bowel habits or change in bladder habits, change in stools or change in urine, dysuria, hematuria,  rash, arthralgias, visual complaints, headache, numbness, weakness or ataxia or problems with walking or coordination,  change in mood or  memory.        Current Meds  Medication Sig  . albuterol (PROVENTIL HFA;VENTOLIN HFA) 108 (90 BASE) MCG/ACT inhaler Inhale 2 puffs into the lungs every 6 (six) hours as needed. For shortness of breath  . albuterol (PROVENTIL) (2.5 MG/3ML) 0.083% nebulizer solution Take 2.5 mg by nebulization every 4 (four) hours as needed. For shortness of breath  . ALPRAZolam (XANAX) 1 MG tablet Take 1 mg by mouth 4 (four) times daily as needed. For anxiety  . aspirin 81 MG tablet Take 81 mg by mouth daily.  Marland Kitchen atorvastatin (LIPITOR) 10 MG tablet Take 1 tablet by mouth at bedtime.  . diphenhydrAMINE HCl, Sleep, (ZZZQUIL PO) Take by mouth as needed.  . Glycopyrrolate-Formoterol (BEVESPI AEROSPHERE) 9-4.8 MCG/ACT AERO Inhale 2 puffs into the lungs 2 (two) times daily.  Marland Kitchen omeprazole (PRILOSEC) 20 MG capsule Take 20 mg by mouth daily.  . OXYGEN 2lpm 24/7  [facility]  . traZODone (DESYREL) 50 MG tablet Take 1 tablet by mouth daily.            Objective:   Physical Exam  amb wf nad > stated age in appearance   07/04/2018        129 01/03/2018      126  07/02/2017        128 12/31/2016      136   07/31/16 145 lb 9.6 oz (66 kg)  01/22/14 180 lb (81.6 kg)  05/23/13 180 lb 12.8 oz (82 kg)      Wt Readings from Last 3 Encounters:  01/03/18 126 lb (57.2 kg)  07/02/17 128 lb 9.6 oz (58.3 kg)  02/04/17 130 lb (59 kg)      Vital signs reviewed - Note on arrival 02 sats   94% on 2lpm cont         HEENT: Poor dentition/ nl oropharynx. Nl external ear canals without cough reflex -  Mild bilateral non-specific turbinate edema  NECK :  without JVD/Nodes/TM/ nl carotid upstrokes bilaterally   LUNGS: no acc muscle use,  Mod barrel  contour chest wall with bilateral  Distant bs s audible wheeze and  without cough on insp or exp maneuver and mod  Hyperresonant  to  percussion bilaterally     CV:  RRR  no s3 or murmur or increase in P2, and no edema   ABD:  soft and nontender with pos mid insp Hoover's  in the supine position. No bruits or organomegaly appreciated, bowel sounds nl  MS:   Nl gait/  ext warm without deformities, calf tenderness, cyanosis or clubbing No obvious joint restrictions   SKIN: warm and dry without lesions    NEURO:  alert, approp, nl sensorium with  no motor or cerebellar deficits apparent.                  Assessment & Plan:

## 2018-07-04 NOTE — Patient Instructions (Signed)
Duoneb (combination of ipatropium and albuterol) is up to 4 x daily   Only use your albuterol as a rescue medication to be used if you can't catch your breath by resting or doing a relaxed purse lip breathing pattern.  - The less you use it, the better it will work when you need it. - Ok to use up to 2 puffs  every 4 hours if you must but call for immediate appointment if use goes up over your usual need - Don't leave home without it !!  (think of it like the spare tire for your car)    Please schedule a follow up visit in 6 months but call sooner if needed

## 2018-07-05 ENCOUNTER — Encounter: Payer: Self-pay | Admitting: Internal Medicine

## 2018-07-05 NOTE — Assessment & Plan Note (Signed)
07/31/2016  Patient Saturations on Room Air at Rest = 88% and while Ambulating = 83% - HC03  07/31/2016  = 38  - 07/31/2016   Walked 3lpm  2 laps @ 185 ft each stopped due to  Sob at moderate pace with no desat  - RA  = 85% 01/03/2018 corrected to 92 on 2lpm continuous   As of 07/04/2018  3lpm at home, 2lpm on portable cont while out = Adequate control on present rx, reviewed in detail with pt > no change in rx needed     I had an extended discussion with the patient reviewing all relevant studies completed to date and  lasting 15 to 20 minutes of a 25 minute visit    Each maintenance medication was reviewed in detail including most importantly the difference between maintenance and prns and under what circumstances the prns are to be triggered using an action plan format that is not reflected in the computer generated alphabetically organized AVS.     Please see AVS for specific instructions unique to this visit that I personally wrote and verbalized to the the pt in detail and then reviewed with pt  by my nurse highlighting any  changes in therapy recommended at today's visit to their plan of care.

## 2018-07-05 NOTE — Assessment & Plan Note (Signed)
Quit smoking 2013 - PFT's  04/27/12  FEV1 0.70 (26 %) ratio 35  p no % improvement from saba p ? prior to study with DLCO  53 % corrects to 69 % for alv volume with classic curvature   - Alpha one AT screening   07/31/16 >   MM - PFT's  12/31/2016  FEV1 0.74 (28 % ) ratio 38  p no % improvement from saba p saba 4h   prior to study with DLCO  37/38c % corrects to 50 % for alv volume   - 07/02/2017   try bevespi: did not use correctly  - 01/03/2018  After extensive coaching inhaler device,  effectiveness =    75% try bevespi again > could not afford and could not tell really helped vs duoneb     Pt is Group B in terms of symptom/risk and laba/lama therefore appropriate rx at this point >>>  But can't afford it and feels does just as well on duoneb bid which she can certainly increase to qid if needed but if not happy with that needs to return with formulary in hand  Advised:  formulary restrictions will be an ongoing challenge for the forseable future and I would be happy to pick an alternative if the pt will first  provide me a list of them -  pt  will need to return here for training for any new device that is required eg dpi vs hfa vs respimat.    In the meantime we can always provide samples so that the patient never runs out of any needed respiratory medications.

## 2018-07-25 ENCOUNTER — Other Ambulatory Visit (HOSPITAL_COMMUNITY): Payer: Self-pay | Admitting: Family Medicine

## 2018-07-25 DIAGNOSIS — Z1231 Encounter for screening mammogram for malignant neoplasm of breast: Secondary | ICD-10-CM

## 2018-08-29 ENCOUNTER — Ambulatory Visit (HOSPITAL_COMMUNITY): Payer: Medicare Other

## 2018-11-11 ENCOUNTER — Telehealth: Payer: Self-pay | Admitting: Internal Medicine

## 2018-11-11 MED ORDER — ALBUTEROL SULFATE HFA 108 (90 BASE) MCG/ACT IN AERS: 2.0000 | INHALATION_SPRAY | Freq: Four times a day (QID) | RESPIRATORY_TRACT | 11 refills | Status: DC | PRN

## 2018-11-11 MED ORDER — IPRATROPIUM-ALBUTEROL 0.5-2.5 (3) MG/3ML IN SOLN: 3.0000 mL | Freq: Four times a day (QID) | RESPIRATORY_TRACT | 11 refills | Status: DC | PRN

## 2018-11-11 NOTE — Telephone Encounter (Signed)
Spoke with pt, needs albuterol hfa and Duoneb sent to new pharmacy: [facility] in [place].  States she needs a dx code on the rx so Medicare part B will pay for neb med.  This has been sent as requested.  Nothing further needed at this time- will close encounter.

## 2018-11-14 ENCOUNTER — Telehealth: Payer: Self-pay | Admitting: Internal Medicine

## 2018-11-14 MED ORDER — IPRATROPIUM-ALBUTEROL 0.5-2.5 (3) MG/3ML IN SOLN: 3.0000 mL | Freq: Four times a day (QID) | RESPIRATORY_TRACT | 11 refills | Status: DC | PRN

## 2018-11-14 NOTE — Telephone Encounter (Signed)
Spoke with the pt  She states [facility] told her that the Duoneb rx that was sent did not have dx code  I see that we sent it with the dx code of J44.9  Called pharm and they did not receive so I have re-sent rx  Pt aware  Nothing further needed

## 2019-01-03 ENCOUNTER — Encounter: Payer: Self-pay | Admitting: Internal Medicine

## 2019-01-03 ENCOUNTER — Other Ambulatory Visit: Payer: Self-pay

## 2019-01-03 ENCOUNTER — Ambulatory Visit (INDEPENDENT_AMBULATORY_CARE_PROVIDER_SITE_OTHER): Payer: Medicare Other | Admitting: Internal Medicine

## 2019-01-03 DIAGNOSIS — J9611 Chronic respiratory failure with hypoxia: Secondary | ICD-10-CM

## 2019-01-03 DIAGNOSIS — J9612 Chronic respiratory failure with hypercapnia: Secondary | ICD-10-CM

## 2019-01-03 DIAGNOSIS — J449 Chronic obstructive pulmonary disease, unspecified: Secondary | ICD-10-CM

## 2019-01-03 NOTE — Assessment & Plan Note (Signed)
Quit smoking 2013 - PFT's  04/27/12  FEV1 0.70 (26 %) ratio 35  p no % improvement from saba p ? prior to study with DLCO  53 % corrects to 69 % for alv volume with classic curvature   - Alpha one AT screening   07/31/16 >   MM - PFT's  12/31/2016  FEV1 0.74 (28 % ) ratio 38  p no % improvement from saba p saba 4h   prior to study with DLCO  37/38c % corrects to 50 % for alv volume   - 07/02/2017   try bevespi: did not use correctly  - 01/03/2018  After extensive coaching inhaler device,  effectiveness =    75% try bevespi again > could not afford and could not tell really helped vs duoneb  Doing reasonable well on just duneb 2-3 x daily s flare x for the cough which may be gerd related  rec No change on duoneb up to qid and time activities during the day to use the neb 15 min prior For cough > mucinex dm 1200 bid and increase ppi to bid ac  No abx or steroids needed at this point

## 2019-01-03 NOTE — Progress Notes (Signed)
Subjective:    Patient ID: Donna Watkins, female    DOB: [birth year],    MRN: [id number]    Brief patient profile:  7 yowm quit smoking in 2013 with severe copd at baseline last seen in pulmonary clinic  by [facility] 11/2013  GOLD IV criteria with PFTs 05/2012 ith fev1 only 26%, diffusion at 52%    History of Present Illness  07/31/2016 1st office visit/    GOLD IV copd/ 02 2lpm 24/7  Chief Complaint  Patient presents with  . Pulmonary Consult    Pt is self referred for COPD. Pt c/o DOE, throat clearing. Pt denies CP/tightness anf recentl f/c/s. Pt states she rarely uses albuterol hfa.   doe -  turns up 3lpm walking but still doe x 50 ft Use scooter at [employer] x 5 years  Sleeps on 4lpm / no am ha / cough or congestion  Uses neb avg bid, very poor  insight into meds/02  Some better breathing p neb saba rec Please remember to go to the lab and x-ray department downstairs in the basement  for your tests - we will call you with the results when they are available. Call us if you want to be referred to [facility] for pulmonary rehab - in meantime continue 2lpm at rest and 3lpm with activity  Please schedule a follow up visit in 3 months but call sooner if needed with full pfts on return      12/31/2016  f/u ov/ re:  2lpm 24/7  Chief Complaint  Patient presents with  . Follow-up    review PFT results. States breathing has been ok since last visit.    breathing ok at rest but can't walk 300 ft even on up to 3lpm s sob on flat surface slow pace  rec Stay on alb/ipatropium up to 4 x daily      07/02/2017  f/u ov/ re: GOLD IV  / 02 24/7  Chief Complaint  Patient presents with  . Follow-up    Breathing is overall doing well overall. She is using her albuterol inhaler once per wk on average. She uses her albuterol and atrovent 2 x daily on average. She states she has been losing wt unintentionally.    Dyspnea:  Not usually walking more than a few hundred  feet on 3lpm / still using scooter at [employer] Cough: not now Sleeping: insomnia no resp complaints  SABA use: about twice dialy duoneb  02: 2lpm x 3lpm walking   rec Plan A = Automatic = Bevespi Take 2 puffs first thing in am and then another 2 puffs about 12 hours later.  Plan B = Backup Only use your albuterol as a rescue medication Plan C = Crisis - only use your albuterol (no ipatropium)  nebulizer if you first try Plan B and it fails to help > ok to use the nebulizer up to every 4 hours but if start needing it regularly call for immediate appointment        07/04/2018  f/u ov/ re:  GOLD IV  / 02 24/7  3lpm at home then 2lpm cont out/ duoneb only  Chief Complaint  Patient presents with  . Follow-up    Breathing is about the same. No new co's. She rarely uses her albuterol inhaler but uses her neb 2 x daily.    Dyspnea:  Always rides scooter / room to room at home on 3lpm  Cough:  no Sleeping: 2 pillows/ bed flat  SABA use: duoneb bid / can't afford bevespi and did not note change on vs off it 02: as above  No flares of copd since prior  rec Duoneb (combination of ipatropium and albuterol) is up to 4 x daily  Only use your albuterol as a rescue medication   Virtual Visit via Telephone Note 01/03/2019   I connected with Donna Watkins on 01/03/19 at  1:00  PM EST by telephone and verified that I am speaking with the correct person using two identifiers.   I discussed the limitations, risks, security and privacy concerns of performing an evaluation and management service by telephone and the availability of in person appointments. I also discussed with the patient that there may be a patient responsible charge related to this service. The patient expressed understanding and agreed to proceed.   History of Present Illness: Dyspnea:  Room to room on 02 never checks/ rides scooter  Cough: a little rattle esp in am worse x 2 months Sleeping: on back on 2 pillows    SABA use: avg maybe two or three times daily  02: 3lpm sleeping and walking around house  Then 2-3 continuous when out lasts hours   No obvious day to day or daytime variability or assoc excess/ purulent sputum or mucus plugs or hemoptysis or cp or chest tightness, subjective wheeze or overt sinus or hb symptoms.    Also denies any obvious fluctuation of symptoms with weather or environmental changes or other aggravating or alleviating factors except as outlined above.   Meds reviewed/ med reconciliation completed       Observations/Objective: Minimally congested cough on voluntary maneuver but no conversational sob     Assessment and Plan: See problem list for active a/p's   Follow Up Instructions: See avs for instructions unique to this ov which includes revised/ updated med list     I discussed the assessment and treatment plan with the patient. The patient was provided an opportunity to ask questions and all were answered. The patient agreed with the plan and demonstrated an understanding of the instructions.   The patient was advised to call back or seek an in-person evaluation if the symptoms worsen or if the condition fails to improve as anticipated.  I provided 25 minutes of non-face-to-face time during this encounter.   Christinia Gully, MD

## 2019-01-03 NOTE — Patient Instructions (Addendum)
Time duoneb to take it 15 minutes before activity   Goal is to keep your level above 90%   For cough > mucinex dm 1200 mg every 12 hours as needed and increase prilosec (omeprazole)  to where Take 30- 60 min before your first and last meals of the day until cough is better for at least   GERD (REFLUX)  is an extremely common cause of respiratory symptoms just like yours , many times with no obvious heartburn at all.    It can be treated with medication, but also with lifestyle changes including elevation of the head of your bed (ideally with 6 -8inch blocks under the headboard of your bed),  Smoking cessation, avoidance of late meals, excessive alcohol, and avoid fatty foods, chocolate, peppermint, colas, red wine, and acidic juices such as orange juice.  NO MINT OR MENTHOL PRODUCTS SO NO COUGH DROPS  USE SUGARLESS CANDY INSTEAD (Jolley ranchers or Stover's or Life Savers) or even ice chips will also do - the key is to swallow to prevent all throat clearing. NO OIL BASED VITAMINS - use powdered substitutes.  Avoid fish oil when coughing.    Please schedule a follow up visit in 6 months in [place]  but call sooner if needed - strongly rec you take the vaccine when offered

## 2019-01-03 NOTE — Assessment & Plan Note (Addendum)
07/31/2016  Patient Saturations on Room Air at Rest = 88% and while Ambulating = 83% - HC03  07/31/2016  = 38  - 07/31/2016   Walked 3lpm  2 laps @ 185 ft each stopped due to  Sob at moderate pace with no desat  - RA  = 85% 01/03/2018 corrected to 92 on 2lpm continuous   Probably overusing 02 at rest and running out of portable   rec 3lpm hs and walking around the house on home conc Titrate resting/ex  flow rates to target of > 90% when out so as not to run out when outside the home as not likely she needs much at rest  Pt informed of the seriousness of COVID 19 infection as a direct risk to lung health  and safey and to close contacts and should continue to wear a facemask in public and minimize exposure to public locations but especially avoid any area or activity where non-close contacts are not observing distancing or wearing an appropriate face mask.  I strongly recommended vaccine when offered.     >>> f/u 6 months in [place] office    Each maintenance medication was reviewed in detail including most importantly the difference between maintenance and as needed and under what circumstances the prns are to be used.  Please see AVS for specific  Instructions which are unique to this visit and I personally typed out  which were reviewed in detail over the phone with the patient and a copy provided mailed to pt

## 2019-03-06 ENCOUNTER — Telehealth: Payer: Self-pay | Admitting: Internal Medicine

## 2019-03-06 MED ORDER — AZITHROMYCIN 250 MG PO TABS: ORAL_TABLET | ORAL | 0 refills | Status: DC

## 2019-03-06 NOTE — Telephone Encounter (Signed)
Called and spoke to pt. Informed her of the recs per MW. Rx sent to preferred pharmacy. Pt verbalized understanding and denied any further questions or concerns at this time.

## 2019-03-06 NOTE — Telephone Encounter (Signed)
zpak   As per previous instructions >>> for cough takemucinex dm 1200 mg every 12 hours as needed and increase prilosec (omeprazole) to where Take 30- 60 min before your first and last meals of the dayuntil cough is better for at leasta week

## 2019-03-06 NOTE — Telephone Encounter (Signed)
Called and spoke with Patient.  Patient stated she had a productive cough, with yellow sputum, that started Saturday. Patient stated she has been using albuterol, and duonebs as instructed. Patient denies any sob, fever, or body aches.  Patient stated she feels like the beginning of a upper respiratory infection. Patient request any prescriptions to be called in at [place], [place].  Message routed to Dr. Melvyn Novas  Instructions  Time duoneb to take it 15 minutes before activity   Goal is to keep your level above 90%   For cough > mucinex dm 1200 mg every 12 hours as needed and increase prilosec (omeprazole)  to where Take 30- 60 min before your first and last meals of the day until cough is better for at least   GERD (REFLUX)  is an extremely common cause of respiratory symptoms just like yours , many times with no obvious heartburn at all.    It can be treated with medication, but also with lifestyle changes including elevation of the head of your bed (ideally with 6 -8inch blocks under the headboard of your bed),  Smoking cessation, avoidance of late meals, excessive alcohol, and avoid fatty foods, chocolate, peppermint, colas, red wine, and acidic juices such as orange juice.  NO MINT OR MENTHOL PRODUCTS SO NO COUGH DROPS  USE SUGARLESS CANDY INSTEAD (Jolley ranchers or Stover's or Life Savers) or even ice chips will also do - the key is to swallow to prevent all throat clearing. NO OIL BASED VITAMINS - use powdered substitutes.  Avoid fish oil when coughing.    Please schedule a follow up visit in 6 months in [place]  but call sooner if needed - strongly rec you take the vaccine when offered

## 2019-04-27 ENCOUNTER — Other Ambulatory Visit: Payer: Self-pay

## 2019-04-27 ENCOUNTER — Ambulatory Visit (INDEPENDENT_AMBULATORY_CARE_PROVIDER_SITE_OTHER): Payer: Medicare Other | Admitting: Adult Health

## 2019-04-27 DIAGNOSIS — J9612 Chronic respiratory failure with hypercapnia: Secondary | ICD-10-CM

## 2019-04-27 DIAGNOSIS — Z9981 Dependence on supplemental oxygen: Secondary | ICD-10-CM | POA: Diagnosis not present

## 2019-04-27 DIAGNOSIS — J441 Chronic obstructive pulmonary disease with (acute) exacerbation: Secondary | ICD-10-CM | POA: Diagnosis not present

## 2019-04-27 DIAGNOSIS — J9611 Chronic respiratory failure with hypoxia: Secondary | ICD-10-CM

## 2019-04-27 DIAGNOSIS — J449 Chronic obstructive pulmonary disease, unspecified: Secondary | ICD-10-CM

## 2019-04-27 MED ORDER — AZITHROMYCIN 250 MG PO TABS: ORAL_TABLET | ORAL | 0 refills | Status: DC

## 2019-04-27 NOTE — Patient Instructions (Addendum)
Zpack take as directed.  Mucinex DM Twice daily  As needed  Cough/congestion .  Saline nasal rinses As needed   Increase Duoneb Four times a day  .  Fluids and rest .  Continue on Oxygen 3l/m .  Follow up with Dr. Melvyn Novas next month as planned and As needed   Please contact office for sooner follow up if symptoms do not improve or worsen or seek emergency care

## 2019-04-27 NOTE — Progress Notes (Signed)
Virtual Visit via Telephone Note  I connected with Donna Watkins on 04/27/19 at  3:30 PM EDT by telephone and verified that I am speaking with the correct person using two identifiers.  Location: Patient: Home  Provider: Home    I discussed the limitations, risks, security and privacy concerns of performing an evaluation and management service by telephone and the availability of in person appointments. I also discussed with the patient that there may be a patient responsible charge related to this service. The patient expressed understanding and agreed to proceed.   History of Present Illness: 59 year old female former smoker followed for very severe gold for COPD that is oxygen dependent on 3 L of oxygen  Today's televisit as an acute office visit.  Patient complains over the last 3 to 4 days that she has had increased fatigue, shortness of breath and now has a productive cough with thick yellow mucus.  She also has nasal congestion with thick mucus and drainage.  Patient is on DuoNeb she says she uses it most days 2-3 times a day.  She denies any hemoptysis chest pain orthopnea PND or leg swelling.  She remains on 3 L of oxygen.  Denies any increased oxygen demands.   Patient Active Problem List   Diagnosis Date Noted  . Encounter for screening colonoscopy 08/31/2012  . COPD  GOLD IV  06/30/2011  . Pulmonary nodule seen on imaging study 06/15/2011  . Polycythemia secondary to smoking 06/15/2011  . Iron deficiency anemia 06/15/2011  . Dehydration, severe 06/14/2011  . Hypotension 06/14/2011  . Sinus bradycardia 06/14/2011  . Arnold-Chiari malformation- mild 06/13/2011  . Acute exacerbation of chronic obstructive pulmonary disease (COPD) ([place]) 06/13/2011  . Depression 06/13/2011  . Weight loss, non-intentional 06/13/2011  . Hypokalemia 06/13/2011  . Acute respiratory failure with hypoxia ([place]) 06/13/2011  . Macrocytosis 06/13/2011  . Chronic respiratory failure with hypoxia and  hypercapnia ([place]) 06/13/2011      Current Outpatient Medications on File Prior to Visit  Medication Sig Dispense Refill  . albuterol (VENTOLIN HFA) 108 (90 Base) MCG/ACT inhaler Inhale 2 puffs into the lungs every 6 (six) hours as needed. DX: J44.9 18 g 11  . ALPRAZolam (XANAX) 1 MG tablet Take 1 mg by mouth 4 (four) times daily as needed. For anxiety    . aspirin 81 MG tablet Take 81 mg by mouth daily.    Marland Kitchen atorvastatin (LIPITOR) 10 MG tablet Take 1 tablet by mouth at bedtime.    . diphenhydrAMINE HCl, Sleep, (ZZZQUIL PO) Take by mouth as needed.    Marland Kitchen ipratropium-albuterol (DUONEB) 0.5-2.5 (3) MG/3ML SOLN Take 3 mLs by nebulization every 6 (six) hours as needed. DX: J44.9 360 mL 11  . omeprazole (PRILOSEC) 20 MG capsule Take 20 mg by mouth daily.    . OXYGEN 2lpm 24/7  [facility]    . traZODone (DESYREL) 50 MG tablet Take 1 tablet by mouth daily.     No current facility-administered medications on file prior to visit.    Observations/Objective: Speaks in full sentences with no audible wheezing or distress  Assessment and Plan: Acute COPD exacerbation.  Patient is to begin a Z-Pak Increase pulmonary hygiene with nebulizers and Mucinex  Plan  Patient Instructions  Zpack take as directed.  Mucinex DM Twice daily  As needed  Cough/congestion .  Saline nasal rinses As needed   Increase Duoneb Four times a day  .  Fluids and rest .  Continue on Oxygen 3l/m .  Follow  up with Dr. Melvyn Novas next month as planned and As needed   Please contact office for sooner follow up if symptoms do not improve or worsen or seek emergency care        Follow Up Instructions: Follow-up in 1 month as planned and as needed   I discussed the assessment and treatment plan with the patient. The patient was provided an opportunity to ask questions and all were answered. The patient agreed with the plan and demonstrated an understanding of the instructions.   The patient was advised to call back or seek an  in-person evaluation if the symptoms worsen or if the condition fails to improve as anticipated.  I provided 22 minutes of non-face-to-face time during this encounter.   Rexene Edison, NP

## 2019-04-28 ENCOUNTER — Other Ambulatory Visit: Payer: Self-pay

## 2019-04-28 ENCOUNTER — Ambulatory Visit: Payer: Medicare Other | Attending: Internal Medicine

## 2019-04-28 DIAGNOSIS — Z20822 Contact with and (suspected) exposure to covid-19: Secondary | ICD-10-CM

## 2019-04-29 LAB — NOVEL CORONAVIRUS, NAA: SARS-CoV-2, NAA: NOT DETECTED

## 2019-04-29 LAB — SARS-COV-2, NAA 2 DAY TAT

## 2019-05-01 ENCOUNTER — Ambulatory Visit (HOSPITAL_COMMUNITY)
Admission: RE | Admit: 2019-05-01 | Discharge: 2019-05-01 | Disposition: A | Discharge status: Home / Self Care | Payer: Medicare Other | Source: Ambulatory Visit | Attending: Family Medicine | Admitting: Family Medicine

## 2019-05-01 ENCOUNTER — Other Ambulatory Visit: Payer: Self-pay

## 2019-05-01 DIAGNOSIS — Z1231 Encounter for screening mammogram for malignant neoplasm of breast: Secondary | ICD-10-CM | POA: Insufficient documentation

## 2019-05-04 ENCOUNTER — Ambulatory Visit: Payer: Medicare Other

## 2019-05-04 ENCOUNTER — Ambulatory Visit: Payer: Medicare Other | Attending: Internal Medicine

## 2019-05-04 DIAGNOSIS — Z23 Encounter for immunization: Secondary | ICD-10-CM

## 2019-05-04 NOTE — Progress Notes (Signed)
   Covid-19 Vaccination Clinic  Name:  Donna Watkins    MRN: [id number] DOB: Oct 15, 1960  05/04/2019  [name] was observed post Covid-19 immunization for 15 minutes without incident. She was provided with Vaccine Information Sheet and instruction to access the V-Safe system.   [name] was instructed to call 911 with any severe reactions post vaccine: Marland Kitchen Difficulty breathing  . Swelling of face and throat  . A fast heartbeat  . A bad rash all over body  . Dizziness and weakness   Immunizations Administered    Name Date Dose VIS Date Route   Moderna COVID-19 Vaccine 05/04/2019 11:11 AM 0.5 mL 12/2018 Intramuscular   Manufacturer: Moderna   Lot: [id number]   [place]: [id number]

## 2019-05-26 ENCOUNTER — Other Ambulatory Visit: Payer: Self-pay

## 2019-05-26 ENCOUNTER — Ambulatory Visit (INDEPENDENT_AMBULATORY_CARE_PROVIDER_SITE_OTHER): Payer: Medicare Other | Admitting: Internal Medicine

## 2019-05-26 ENCOUNTER — Encounter: Payer: Self-pay | Admitting: Internal Medicine

## 2019-05-26 DIAGNOSIS — J9611 Chronic respiratory failure with hypoxia: Secondary | ICD-10-CM

## 2019-05-26 DIAGNOSIS — Q0700 Arnold-Chiari syndrome without spina bifida or hydrocephalus: Secondary | ICD-10-CM

## 2019-05-26 DIAGNOSIS — J449 Chronic obstructive pulmonary disease, unspecified: Secondary | ICD-10-CM

## 2019-05-26 DIAGNOSIS — J9612 Chronic respiratory failure with hypercapnia: Secondary | ICD-10-CM | POA: Diagnosis not present

## 2019-05-26 MED ORDER — ALBUTEROL SULFATE (2.5 MG/3ML) 0.083% IN NEBU
2.5000 mg | INHALATION_SOLUTION | RESPIRATORY_TRACT | Status: DC | PRN
Start: 2019-05-26 — End: 2020-11-04

## 2019-05-26 MED ORDER — STIOLTO RESPIMAT 2.5-2.5 MCG/ACT IN AERS: 2.0000 | INHALATION_SPRAY | Freq: Every day | RESPIRATORY_TRACT | 11 refills | Status: DC

## 2019-05-26 MED ORDER — STIOLTO RESPIMAT 2.5-2.5 MCG/ACT IN AERS
2.0000 | INHALATION_SPRAY | Freq: Every day | RESPIRATORY_TRACT | 0 refills | Status: DC
Start: 2019-05-26 — End: 2019-05-27

## 2019-05-26 NOTE — Patient Instructions (Addendum)
If nose not better after you finish your antibiotics,  I would recommend ENT referral by your PCP  Plan A = Automatic = Always=    Stiolto 2 puffs each am  - NO IPATPROPIUM   Work on inhaler technique:  relax and gently blow all the way out then take a nice smooth deep breath back in, triggering the inhaler at same time you start breathing in.  Hold for up to 5 seconds if you can.   Rinse and gargle with water when done    Plan B = Backup (to supplement plan A, not to replace it) Only use your albuterol inhaler as a rescue medication to be used if you can't catch your breath by resting or doing a relaxed purse lip breathing pattern.  - The less you use it, the better it will work when you need it. - Ok to use the inhaler up to 2 puffs  every 4 hours if you must but call for appointment if use goes up over your usual need - Don't leave home without it !!  (think of it like the spare tire for your car)   Plan C = Crisis (instead of Plan B but only if Plan B stops working) - only use your albuterol nebulizer if you first try Plan B and it fails to help > ok to use the nebulizer up to every 4 hours but if start needing it regularly call for immediate appointment   Please schedule a follow up visit in 3 months but call sooner if needed  - needs cxr on return

## 2019-05-26 NOTE — Progress Notes (Signed)
Subjective:    Patient ID: Donna Watkins, female    DOB: 1960-10-30,    MRN: [id number]    Brief patient profile:  8 yowm MM/quit smoking in 2013 with severe copd at baseline last seen in pulmonary clinic  by [facility] 11/2013  GOLD IV criteria with PFTs 05/2012 ith fev1 only 26%, diffusion at 52%    History of Present Illness  07/31/2016 1st office visit/ Zariana Strub   GOLD IV copd/ 02 2lpm 24/7  Chief Complaint  Patient presents with  . Pulmonary Consult    Pt is self referred for COPD. Pt c/o DOE, throat clearing. Pt denies CP/tightness anf recentl f/c/s. Pt states she rarely uses albuterol hfa.   doe -  turns up 3lpm walking but still doe x 50 ft Use scooter at [employer] x 5 years  Sleeps on 4lpm / no am ha / cough or congestion  Uses neb avg bid, very poor  insight into meds/02  Some better breathing p neb saba rec Please remember to go to the lab and x-ray department downstairs in the basement  for your tests - we will call you with the results when they are available. Call us if you want to be referred to [facility] for pulmonary rehab - in meantime continue 2lpm at rest and 3lpm with activity    Please schedule a follow up visit in 3 months but call sooner if needed with full pfts on return      12/31/2016  f/u ov/Athol Bolds re:  2lpm 24/7  Chief Complaint  Patient presents with  . Follow-up    review PFT results. States breathing has been ok since last visit.    breathing ok at rest but can't walk 300 ft even on up to 3lpm s sob on flat surface slow pace  rec Stay on alb/ipatropium up to 4 x daily      07/02/2017  f/u ov/Anaiyah Anglemyer re: GOLD IV  / 02 24/7  Chief Complaint  Patient presents with  . Follow-up    Breathing is overall doing well overall. She is using her albuterol inhaler once per wk on average. She uses her albuterol and atrovent 2 x daily on average. She states she has been losing wt unintentionally.    Dyspnea:  Not usually walking more than a few  hundred feet on 3lpm / still using scooter at [employer] Cough: not now Sleeping: insomnia no resp complaints  SABA use: about twice dialy duoneb  02: 2lpm x 3lpm walking   rec Plan A = Automatic = Bevespi Take 2 puffs first thing in am and then another 2 puffs about 12 hours later.  Plan B = Backup Only use your albuterol as a rescue medication Plan C = Crisis - only use your albuterol (no ipatropium)  nebulizer if you first try Plan B and it fails to help > ok to use the nebulizer up to every 4 hours but if start needing it regularly call for immediate appointment        07/04/2018  f/u ov/Kenston Longton re:  GOLD IV  / 02 24/7  3lpm at home then 2lpm cont out/ duoneb only  Chief Complaint  Patient presents with  . Follow-up    Breathing is about the same. No new co's. She rarely uses her albuterol inhaler but uses her neb 2 x daily.    Dyspnea:  Always rides scooter / room to room at home on 3lpm  Cough: no Sleeping: 2 pillows/ bed flat  SABA use: duoneb bid / can't afford bevespi and did not note change on vs off it 02: as above  No flares of copd since prior  rec Duoneb (combination of ipatropium and albuterol) is up to 4 x daily  Only use your albuterol as a rescue medication to be used if you can't catch your breath by resting or doing a relaxed purse lip breathing pattern.  - The less you use it, the better it will work when you need it. - Ok to use up to 2 puffs  every 4 hours if you must       05/26/2019  f/u ov/Nadim Malia re: copd IV just on duoneb qid / has had 1st vaccine  Chief Complaint  Patient presents with  . Follow-up    Breathing improved since had televisit with Tammy a month ago. 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    Dyspnea:  Walking mailbox = 100 ft, better if does so right after duoneb  Cough: better  Sleeping: bed flat two pillows  SABA use: rarely 02: 2lpm and up to 2.5 never checks sats   No obvious day to day or daytime variability or assoc  excess/ purulent sputum or mucus plugs or hemoptysis or cp or chest tightness, subjective wheeze or overt  hb symptoms.   Sleeping  without nocturnal  or early am exacerbation  of respiratory  c/o's or need for noct saba. Also denies any obvious fluctuation of symptoms with weather or environmental changes or other aggravating or alleviating factors except as outlined above   No unusual exposure hx or h/o childhood pna/ asthma or knowledge of premature birth.  Current Allergies, Complete Past Medical History, Past Surgical History, Family History, and Social History were reviewed in [employer] record.  ROS  The following are not active complaints unless bolded Hoarseness, sore throat, dysphagia, dental problems, itching, sneezing,  nasal congestion or discharge of excess mucus or purulent secretions, ear ache,   fever, chills, sweats, unintended wt loss or wt gain, classically pleuritic or exertional cp,  orthopnea pnd or arm/hand swelling  or leg swelling, presyncope, palpitations, abdominal pain, anorexia, nausea, vomiting, diarrhea  or change in bowel habits or change in bladder habits, change in stools or change in urine, dysuria, hematuria,  rash, arthralgias, visual complaints, headache, numbness, weakness or ataxia or problems with walking or coordination,  change in mood or  memory.        Current Meds  Medication Sig  . albuterol (VENTOLIN HFA) 108 (90 Base) MCG/ACT inhaler Inhale 2 puffs into the lungs every 6 (six) hours as needed. DX: J44.9  . ALPRAZolam (XANAX) 1 MG tablet Take 1 mg by mouth 4 (four) times daily as needed. For anxiety  . atorvastatin (LIPITOR) 10 MG tablet Take 1 tablet by mouth at bedtime.  Marland Kitchen doxycycline (VIBRAMYCIN) 100 MG capsule Take 100 mg by mouth 2 (two) times daily.  . Ferrous Sulfate (IRON PO) Take 1 tablet by mouth daily.  Marland Kitchen ibuprofen (ADVIL) 200 MG tablet Take 200 mg by mouth every 6 (six) hours as needed.  Marland Kitchen ipratropium-albuterol  (DUONEB) 0.5-2.5 (3) MG/3ML SOLN Take 3 mLs by nebulization every 6 (six) hours as needed. DX: J44.9  . Multiple Vitamins-Minerals (CENTRUM SILVER PO) Take 1 tablet by mouth daily.  Marland Kitchen omeprazole (PRILOSEC) 20 MG capsule Take 20 mg by mouth daily.  . OXYGEN 2lpm 24/7  [facility]  . traZODone (DESYREL)  50 MG tablet Take 1 tablet by mouth daily.                Objective:   Physical Exam  Thin amb wf nad    05/26/2019       106  07/04/2018        129 01/03/2018      126  07/02/2017        128 12/31/2016      136   07/31/16 145 lb 9.6 oz (66 kg)  01/22/14 180 lb (81.6 kg)  05/23/13 180 lb 12.8 oz (82 kg)         01/03/18 126 lb (57.2 kg)  07/02/17 128 lb 9.6 oz (58.3 kg)  02/04/17 130 lb (59 kg)     Vital signs reviewed  05/26/2019  - Note at rest 02 sats  99% on 2lpm cont       HEENT : pt wearing mask not removed for exam due to covid -19 concerns.    NECK :  without JVD/Nodes/TM/ nl carotid upstrokes bilaterally   LUNGS: no acc muscle use,  Mod barrel  contour chest wall with bilateral  Distant bs s audible wheeze and  without cough on insp or exp maneuvers and mod  Hyperresonant  to  percussion bilaterally     CV:  RRR  no s3 or murmur or increase in P2, and no edema   ABD:  soft and nontender with pos mid insp Hoover's  in the supine position. No bruits or organomegaly appreciated, bowel sounds nl  MS:     ext warm without deformities, calf tenderness, cyanosis   Mild/mod clubbing No obvious joint restrictions   SKIN: warm and dry without lesions    NEURO:  alert, approp, nl sensorium with  no motor or cerebellar deficits apparent.                 Assessment & Plan:

## 2019-05-27 ENCOUNTER — Encounter: Payer: Self-pay | Admitting: Internal Medicine

## 2019-05-27 NOTE — Assessment & Plan Note (Addendum)
07/31/2016  Patient Saturations on Room Air at Rest = 88% and while Ambulating = 83% - HC03  07/31/2016  = 38  - 07/31/2016   Walked 3lpm  2 laps @ 185 ft each stopped due to  Sob at moderate pace with no desat  - RA  = 85% 01/03/2018 corrected to 92 on 2lpm continuous -  05/26/2019   Walked 2lpm cont x   approx   200 ft  @ moderate pace  stopped due to  Sob with sats still 91%     Again reviewed:  Make sure you check your oxygen saturations at highest level of activity to be sure it stays over 90% and adjust upward to maintain this level if needed but remember to turn it back to previous settings when you stop (to conserve your supply).           Each maintenance medication was reviewed in detail including emphasizing most importantly the difference between maintenance and prns and under what circumstances the prns are to be triggered using an action plan format where appropriate.  Total time for H and P, chart review, counseling, teaching device/  directly observing portions of ambulatory 02 saturation study/  and generating customized AVS unique to this office visit / charting = 30 min

## 2019-05-27 NOTE — Assessment & Plan Note (Addendum)
-   PFT's  04/27/12  FEV1 0.70 (26 %) ratio 35  p no % improvement from saba p ? prior to study with DLCO  53 % corrects to 69 % for alv volume with classic curvature   - Alpha one AT screening   07/31/16 >   MM - PFT's  12/31/2016  FEV1 0.74 (28 % ) ratio 38  p no % improvement from saba p saba 4h   prior to study with DLCO  37/38c % corrects to 50 % for alv volume   - 07/02/2017   try bevespi: did not use correctly  - 01/03/2018    try bevespi again > could not afford and could not tell really helped vs duoneb - 05/26/2019  After extensive coaching inhaler device,  effectiveness =    90% with smi > try stiolto   Pt is Group B in terms of symptom/risk and laba/lama therefore appropriate rx at this point >>>  rechallenge with lama/laba   >>> f/u here in 3 m with cxr and  with ent prn rhinitis ? Sinusitis p completes present abx if not back to baseline

## 2019-06-01 ENCOUNTER — Other Ambulatory Visit: Payer: Self-pay

## 2019-06-01 ENCOUNTER — Ambulatory Visit: Payer: Medicare Other | Attending: Internal Medicine

## 2019-06-01 DIAGNOSIS — Z23 Encounter for immunization: Secondary | ICD-10-CM

## 2019-06-01 NOTE — Progress Notes (Signed)
   Covid-19 Vaccination Clinic  Name:  Donna Watkins    MRN: [id number] DOB: 02-Jun-1960  06/01/2019  [name] was observed post Covid-19 immunization for 15 minutes without incident. She was provided with Vaccine Information Sheet and instruction to access the V-Safe system.   [name] was instructed to call 911 with any severe reactions post vaccine: Marland Kitchen Difficulty breathing  . Swelling of face and throat  . A fast heartbeat  . A bad rash all over body  . Dizziness and weakness   Immunizations Administered    Name Date Dose VIS Date Route   Moderna COVID-19 Vaccine 06/01/2019 12:19 PM 0.5 mL 12/2018 Intramuscular   Manufacturer: Moderna   Lot: [id number]   [place]: [id number]

## 2019-07-12 IMAGING — CT CT ABDOMEN W/ CM
2 of 5 series · 16 of 46 positions shown, 18 images · IV contrast (Isovue)
Comparison: 12/02/2016 renal sonogram. 11/15/2013 chest CT.
06/14/2011 CT abdomen/pelvis.

CLINICAL DATA: Abnormal weight loss of 30 pounds in the prior year.

EXAM:
CT ABDOMEN WITH CONTRAST
TECHNIQUE: Multidetector CT imaging of the abdomen was performed using the
standard protocol following bolus administration of intravenous
contrast.
CONTRAST:  100mL [id number] IOPAMIDOL (09WF36-UVV) INJECTION 61%

[Series 2: axial st · axial · 0.67mm/px · z∈[-223,+7]mm · 13 of 54 slices shown, 15 images]
[im 4/54  soft-tissue]
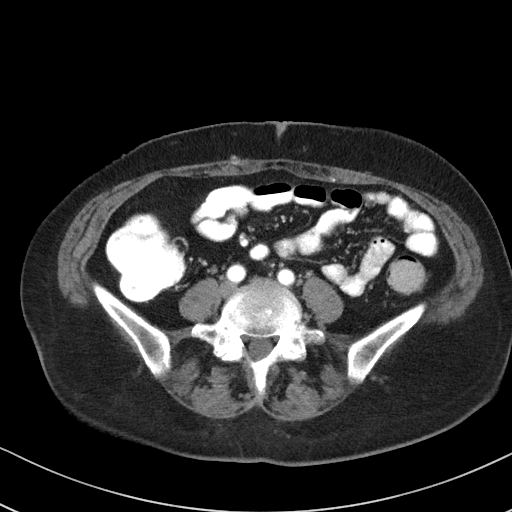
[im 4/54  bone]
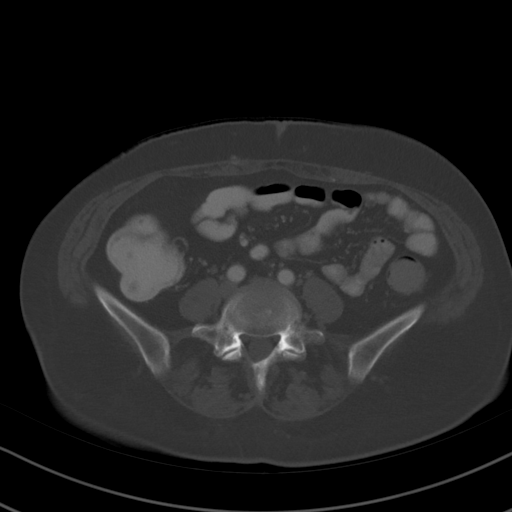
[im 8/54  soft-tissue]
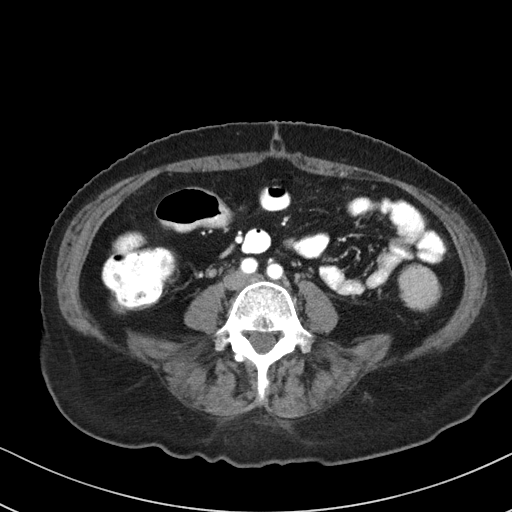
[im 12/54  soft-tissue]
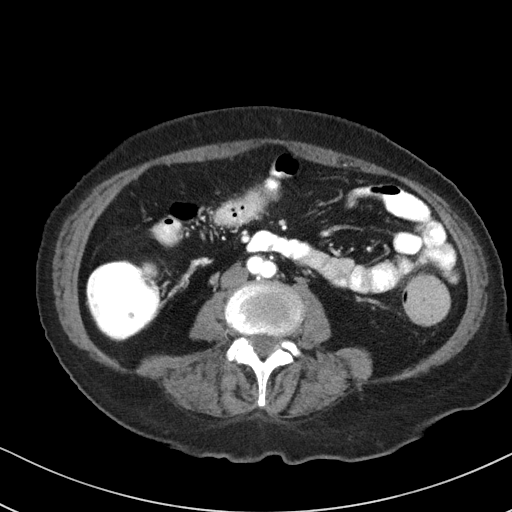
[im 16/54  soft-tissue]
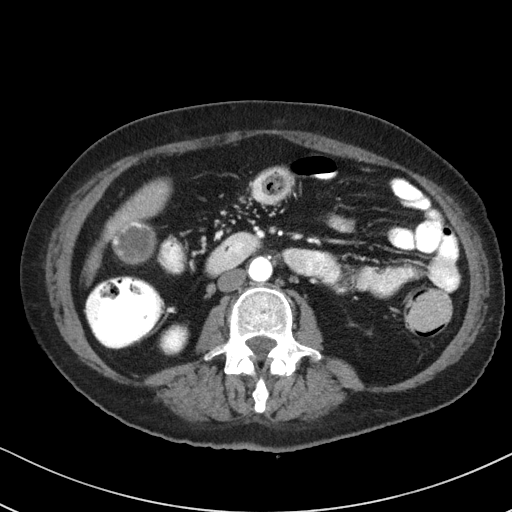
[im 19/54  soft-tissue]
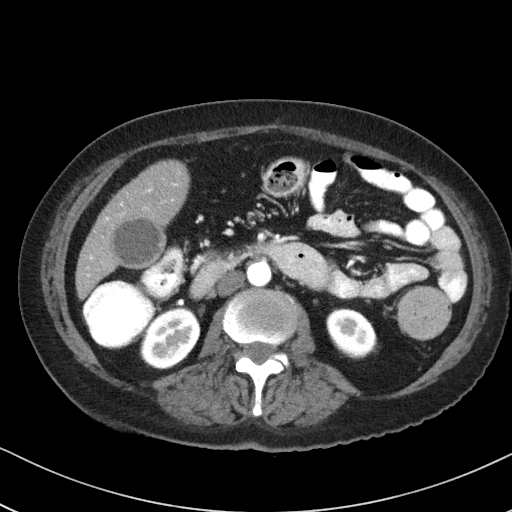
[im 23/54  soft-tissue]
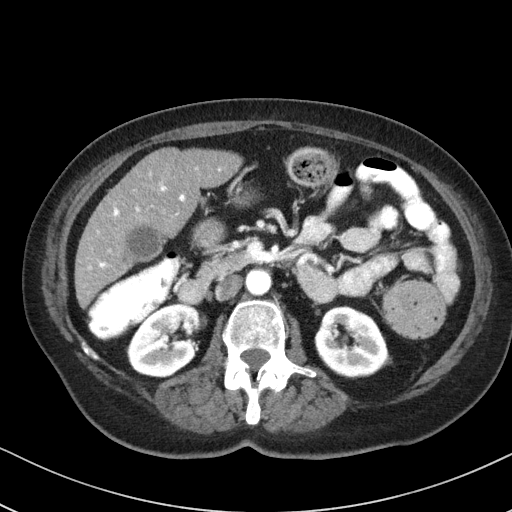
[im 27/54  soft-tissue]
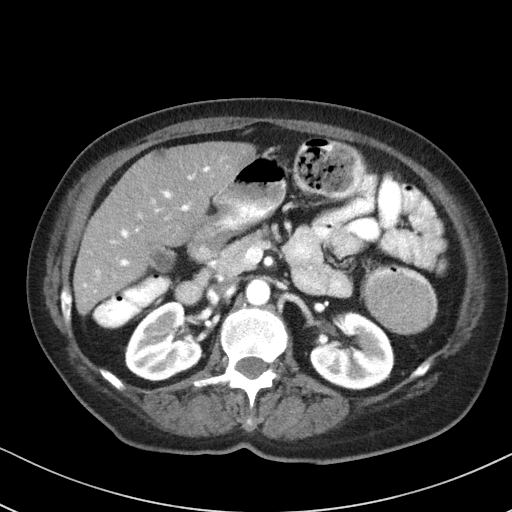
[im 31/54  soft-tissue]
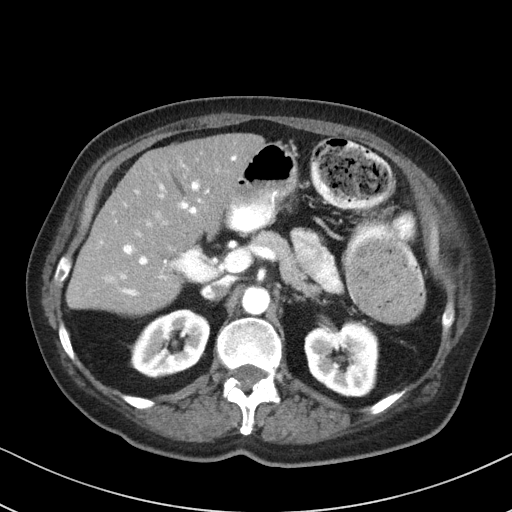
[im 35/54  soft-tissue]
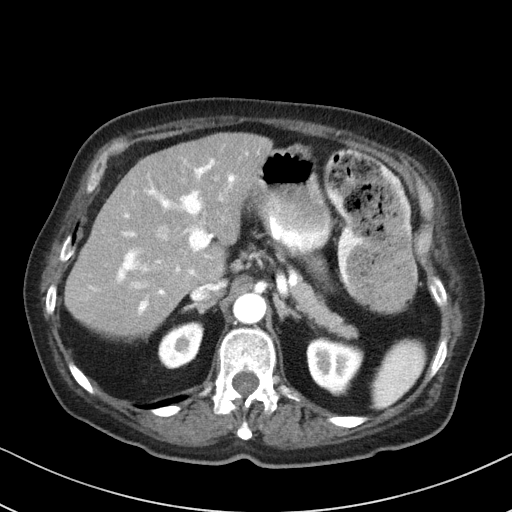
[im 35/54  bone]
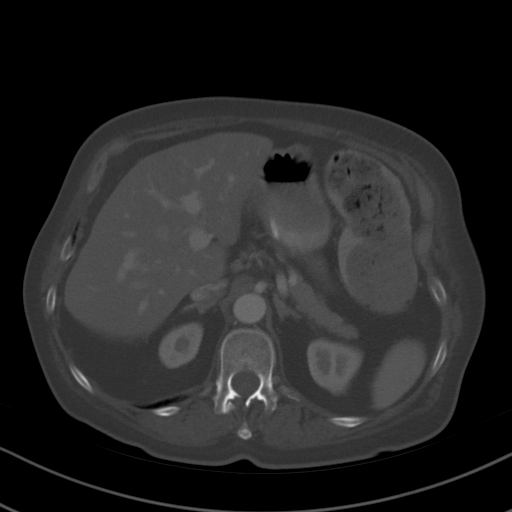
[im 38/54  soft-tissue]
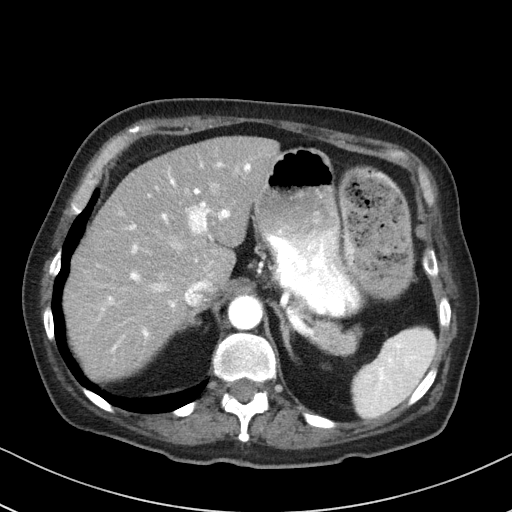
[im 42/54  soft-tissue]
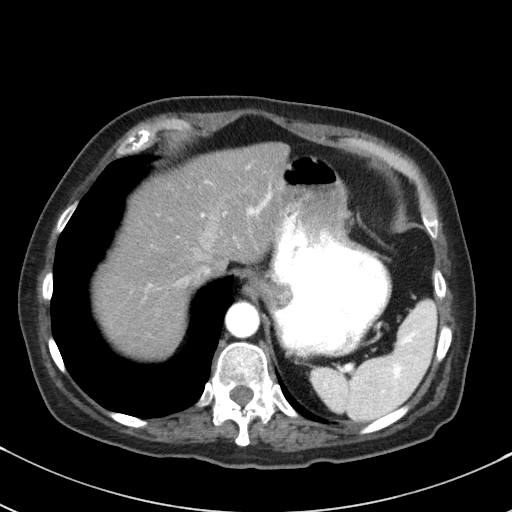
[im 46/54  soft-tissue]
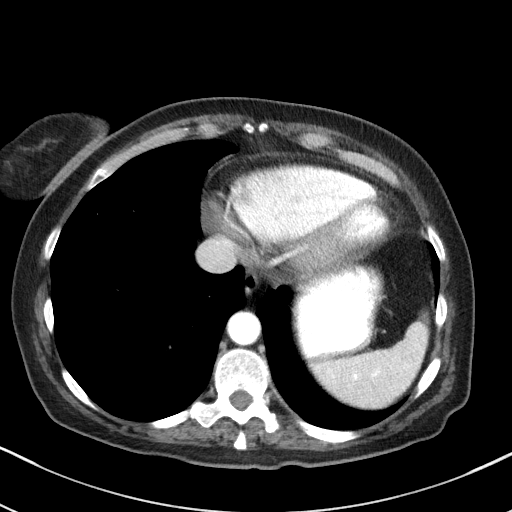
[im 50/54  soft-tissue]
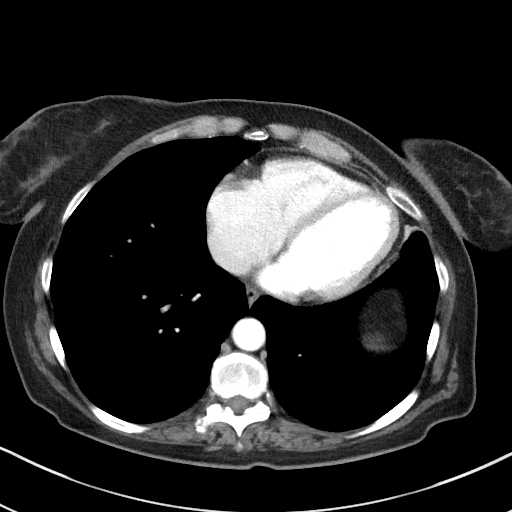

[Series 4: coronal st · coronal · 0.55mm/px · 3 of 85 slices shown]
[im 29/85  soft-tissue]
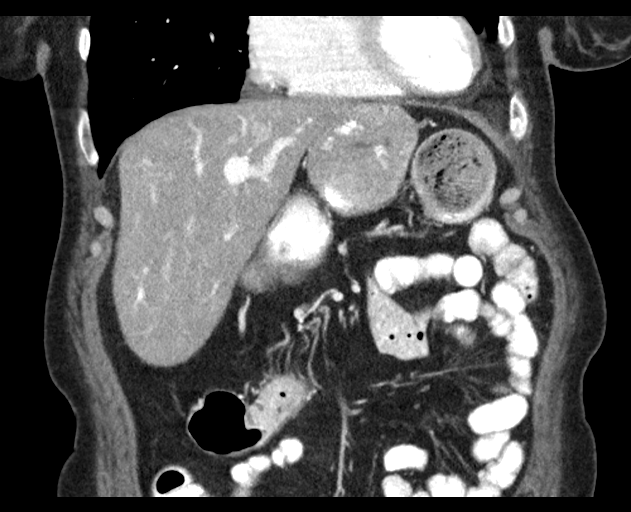
[im 38/85  soft-tissue]
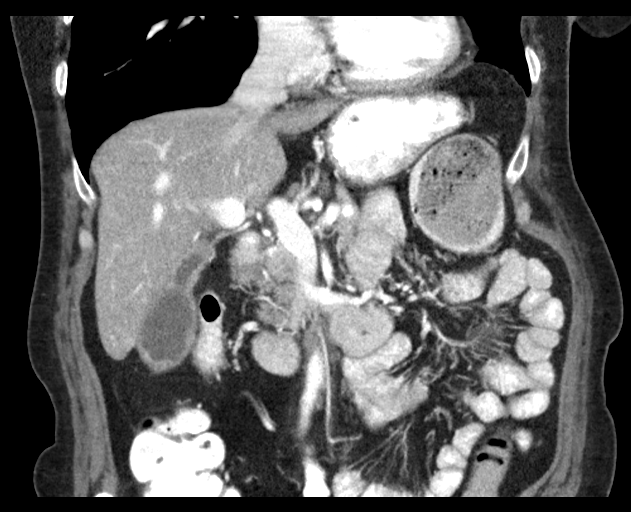
[im 47/85  soft-tissue]
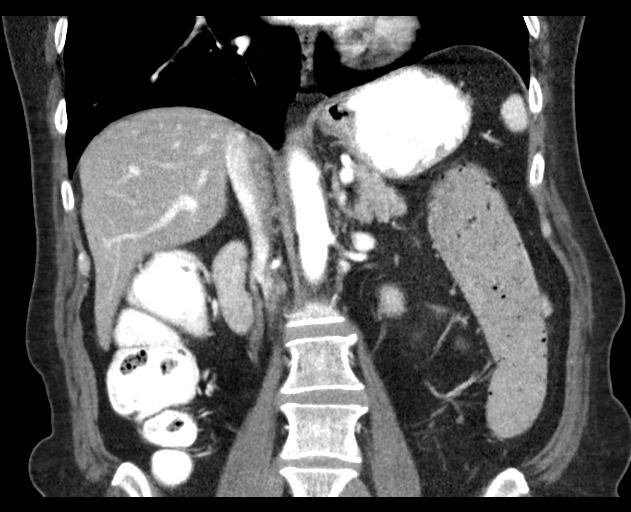

[16 of 46 positions shown; findings below may reference images not displayed]

FINDINGS: Lower chest: Centrilobular emphysema at the lung bases. No acute
abnormality at the lung bases.

Hepatobiliary: Normal liver size. No liver masses. Nondistended
gallbladder. There are scattered curvilinear calcifications in the
fundal gallbladder wall compatible with porcelain gallbladder. No
radiopaque cholelithiasis. No definite gallbladder wall thickening
or pericholecystic fluid. No discrete gallbladder mass. No biliary
ductal dilatation.

Pancreas: Normal, with no mass or duct dilation.

Spleen: Normal size. No mass.

Adrenals/Urinary Tract: Normal adrenals. No hydronephrosis.
Hypodense subcentimeter lower right renal cortical lesion, too small
to characterize, which requires no follow-up. No suspicious renal
masses.

Stomach/Bowel: Grossly normal stomach. Visualized small and large
bowel is normal caliber, with no bowel wall thickening.

Vascular/Lymphatic: Normal caliber abdominal aorta. Patent portal,
splenic, hepatic and renal veins. No pathologically enlarged lymph
nodes in the abdomen.

Other: No pneumoperitoneum, ascites or focal fluid collection.

Musculoskeletal: No aggressive appearing focal osseous lesions.
IMPRESSION: 1. No acute abnormality.  No abdominal lymphadenopathy.
2. Porcelain gallbladder.
3.  Emphysema ([id number]).

## 2019-08-03 ENCOUNTER — Telehealth: Payer: Self-pay | Admitting: Internal Medicine

## 2019-08-03 MED ORDER — PREDNISONE 10 MG PO TABS
ORAL_TABLET | ORAL | 0 refills | Status: DC
Start: 2019-08-03 — End: 2019-08-28

## 2019-08-03 MED ORDER — AZITHROMYCIN 250 MG PO TABS: ORAL_TABLET | ORAL | 0 refills | Status: AC

## 2019-08-03 NOTE — Telephone Encounter (Signed)
Spoke with pt. She is aware of MW's recommendations. Rxs have been sent in. Nothing further was needed. 

## 2019-08-03 NOTE — Telephone Encounter (Signed)
zpak Prednisone 10 mg take  4 each am x 2 days,   2 each am x 2 days,  1 each am x 2 days and stop  

## 2019-08-03 NOTE — Telephone Encounter (Signed)
Spoke with pt. States that she is not feeling well. Reports increased coughing, chest tightness, chest congestion and sinus congestion. Cough is non productive. Denies wheezing, shortness of breath, fever or sick contacts. Pt has been taking Mucinex with no relief. Pt would like recommendations.  Dr. Melvyn Novas - please advise. Thanks.

## 2019-08-28 ENCOUNTER — Other Ambulatory Visit: Payer: Self-pay

## 2019-08-28 ENCOUNTER — Ambulatory Visit (HOSPITAL_COMMUNITY)
Admission: RE | Admit: 2019-08-28 | Discharge: 2019-08-28 | Disposition: A | Discharge status: Home / Self Care | Payer: Medicare Other | Source: Ambulatory Visit | Attending: Internal Medicine | Admitting: Internal Medicine

## 2019-08-28 ENCOUNTER — Encounter: Payer: Self-pay | Admitting: Internal Medicine

## 2019-08-28 ENCOUNTER — Ambulatory Visit (INDEPENDENT_AMBULATORY_CARE_PROVIDER_SITE_OTHER): Payer: Medicare Other | Admitting: Internal Medicine

## 2019-08-28 DIAGNOSIS — J9611 Chronic respiratory failure with hypoxia: Secondary | ICD-10-CM | POA: Diagnosis not present

## 2019-08-28 DIAGNOSIS — J449 Chronic obstructive pulmonary disease, unspecified: Secondary | ICD-10-CM | POA: Diagnosis present

## 2019-08-28 DIAGNOSIS — J9612 Chronic respiratory failure with hypercapnia: Secondary | ICD-10-CM

## 2019-08-28 DIAGNOSIS — R21 Rash and other nonspecific skin eruption: Secondary | ICD-10-CM | POA: Diagnosis not present

## 2019-08-28 MED ORDER — FAMOTIDINE 20 MG PO TABS: ORAL_TABLET | ORAL | 11 refills | Status: DC

## 2019-08-28 NOTE — Assessment & Plan Note (Signed)
07/31/2016  Patient Saturations on Room Air at Rest = 88% and while Ambulating = 83% - HC03  07/31/2016  = 38  - 07/31/2016   Walked 3lpm  2 laps @ 185 ft each stopped due to  Sob at moderate pace with no desat  - RA  = 85% 01/03/2018 corrected to 92 on 2lpm continuous  -  05/26/2019   Walked 2lpm cont x  approx   200 ft  @ moderate pace  stopped due to  Sob with sats still 91%   -  08/28/2019   Walked 2.5 lpm  approx   400 ft  @ moderate pace  stopped due to  Sob with sats still   90%  Adequate control on present rx, reviewed in detail with pt > no change in rx needed    Advised: Make sure you check your oxygen saturations at highest level of activity to be sure it stays over 90% and adjust upward to maintain this level if needed but remember to turn it back to previous settings when you stop (to conserve your supply).

## 2019-08-28 NOTE — Progress Notes (Signed)
Subjective:    Patient ID: Donna Watkins, female    DOB: [birth year],    MRN: [id number]    Brief patient profile:  36   yowm MM/quit smoking in 2013 with severe copd at baseline last seen in pulmonary clinic  by [facility] 11/2013  GOLD IV criteria with PFTs 05/2012 ith fev1 only 26%, diffusion at 52%    History of Present Illness  07/31/2016 1st office visit/ Donna Watkins   GOLD IV copd/ 02 2lpm 24/7  Chief Complaint  Patient presents with  . Pulmonary Consult    Pt is self referred for COPD. Pt c/o DOE, throat clearing. Pt denies CP/tightness anf recentl f/c/s. Pt states she rarely uses albuterol hfa.   doe -  turns up 3lpm walking but still doe x 50 ft Use scooter at [employer] x 5 years  Sleeps on 4lpm / no am ha / cough or congestion  Uses neb avg bid, very poor  insight into meds/02  Some better breathing p neb saba rec  Call us if you want to be referred to [facility] for pulmonary rehab - in meantime continue 2lpm at rest and 3lpm with activity    Please schedule a follow up visit in 3 months but call sooner if needed with full pfts on return      12/31/2016  f/u ov/Donna Watkins re:  2lpm 24/7  Chief Complaint  Patient presents with  . Follow-up    review PFT results. States breathing has been ok since last visit.    breathing ok at rest but can't walk 300 ft even on up to 3lpm s sob on flat surface slow pace  rec Stay on alb/ipatropium up to 4 x daily      07/02/2017  f/u ov/Donna Watkins re: GOLD IV  / 02 24/7  Chief Complaint  Patient presents with  . Follow-up    Breathing is overall doing well overall. She is using her albuterol inhaler once per wk on average. She uses her albuterol and atrovent 2 x daily on average. She states she has been losing wt unintentionally.    Dyspnea:  Not usually walking more than a few hundred feet on 3lpm / still using scooter at [employer] Cough: not now Sleeping: insomnia no resp complaints  SABA use: about twice dialy duoneb  02:  2lpm x 3lpm walking   rec Plan A = Automatic = Bevespi Take 2 puffs first thing in am and then another 2 puffs about 12 hours later.  Plan B = Backup Only use your albuterol as a rescue medication Plan C = Crisis - only use your albuterol (no ipatropium)  nebulizer if you first try Plan B and it fails to help > ok to use the nebulizer up to every 4 hours but if start needing it regularly call for immediate appointment        07/04/2018  f/u ov/Donna Watkins re:  GOLD IV  / 02 24/7  3lpm at home then 2lpm cont out/ duoneb only  Chief Complaint  Patient presents with  . Follow-up    Breathing is about the same. No new co's. She rarely uses her albuterol inhaler but uses her neb 2 x daily.    Dyspnea:  Always rides scooter / room to room at home on 3lpm  Cough: no Sleeping: 2 pillows/ bed flat  SABA use: duoneb bid / can't afford bevespi and did not note change on vs off it 02:  as above  No flares of copd since prior  rec Duoneb (combination of ipatropium and albuterol) is up to 4 x daily  Only use your albuterol as a rescue medication to be used if you can't catch your breath by resting or doing a relaxed purse lip breathing pattern.  - The less you use it, the better it will work when you need it. - Ok to use up to 2 puffs  every 4 hours if you must       05/26/2019  f/u ov/Donna Watkins re: copd IV just on duoneb qid / has had 1st vaccine  Chief Complaint  Patient presents with  . Follow-up    Breathing improved since had televisit with Tammy a month ago. 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    Dyspnea:  Walking mailbox = 100 ft, better if does so right after duoneb  Cough: better  Sleeping: bed flat two pillows  SABA use: rarely 02: 2lpm and up to 2.5 never checks sats rec If nose not better after you finish your antibiotics,  I would recommend ENT referral by your PCP Plan A = Automatic = Always=    Stiolto 2 puffs each am  - NO IPATPROPIUM  Work on inhaler technique  Plan B =  Backup (to supplement plan A, not to replace it) Only use your albuterol inhaler as a rescue medication  Plan C = Crisis (instead of Plan B but only if Plan B stops working) - only use your albuterol nebulizer if you first try Plan B and it fails to help > ok to use the nebulizer up to every 4 hours but if start needing it regularly call for immediate appointment - needs cxr on return    08/28/2019  f/u ov/ office/Donna Watkins re: COPD IV/ 02 dep / duoneb  Chief Complaint  Patient presents with  . Follow-up  Dyspnea:  100 ft to mb and back, flat  Cough: none  Sleeping: flat 2 pillows non noct resp cc's  SABA use: duoneb tid  02: sleeps on 3lpm / walks on 2- 3 lpm    No obvious day to day or daytime variability or assoc excess/ purulent sputum or mucus plugs or hemoptysis or cp or chest tightness, subjective wheeze or overt sinus or hb symptoms.   Sleeping  without nocturnal  or early am exacerbation  of respiratory  c/o's or need for noct saba. Also denies any obvious fluctuation of symptoms with weather or environmental changes or other aggravating or alleviating factors except as outlined above   No unusual exposure hx or h/o childhood pna/ asthma or knowledge of premature birth.  Current Allergies, Complete Past Medical History, Past Surgical History, Family History, and Social History were reviewed in [employer] record.  ROS  The following are not active complaints unless bolded Hoarseness, sore throat, dysphagia, dental problems, itching, sneezing,  nasal congestion or discharge of excess mucus or purulent secretions, ear ache,   fever, chills, sweats, unintended wt loss or wt gain, classically pleuritic or exertional cp,  orthopnea pnd or arm/hand swelling  or leg swelling, presyncope, palpitations, abdominal pain, anorexia, nausea, vomiting, diarrhea  or change in bowel habits or change in bladder habits, change in stools or change in urine, dysuria,  hematuria,  Rash x sev weeks, not puritic , arthralgias, visual complaints, headache, numbness, weakness or ataxia or problems with walking or coordination,  change in mood or  memory.  Current Meds  Medication Sig  . albuterol (PROVENTIL) (2.5 MG/3ML) 0.083% nebulizer solution Take 3 mLs (2.5 mg total) by nebulization every 4 (four) hours as needed for wheezing or shortness of breath.  Marland Kitchen albuterol (VENTOLIN HFA) 108 (90 Base) MCG/ACT inhaler Inhale 2 puffs into the lungs every 6 (six) hours as needed. DX: J44.9  . ALPRAZolam (XANAX) 1 MG tablet Take 1 mg by mouth 4 (four) times daily as needed. For anxiety  . atorvastatin (LIPITOR) 10 MG tablet Take 1 tablet by mouth at bedtime.  . Ferrous Sulfate (IRON PO) Take 1 tablet by mouth daily.  Marland Kitchen ibuprofen (ADVIL) 200 MG tablet Take 200 mg by mouth every 6 (six) hours as needed.  Marland Kitchen ipratropium (ATROVENT) 0.02 % nebulizer solution Take 0.5 mg by nebulization 4 (four) times daily.  . Multiple Vitamins-Minerals (CENTRUM SILVER PO) Take 1 tablet by mouth daily.  Marland Kitchen omeprazole (PRILOSEC) 20 MG capsule Take 20 mg by mouth daily.  . OXYGEN 2lpm 24/7  AHC  . traZODone (DESYREL) 50 MG tablet Take 1 tablet by mouth daily.         .         Objective:   Physical Exam   elderly amb wf nad   08/28/2019         107  05/26/2019       106  07/04/2018        129 01/03/2018      126  07/02/2017        128 12/31/2016      136   07/31/16 145 lb 9.6 oz (66 kg)  01/22/14 180 lb (81.6 kg)  05/23/13 180 lb 12.8 oz (82 kg)         01/03/18 126 lb (57.2 kg)  07/02/17 128 lb 9.6 oz (58.3 kg)  02/04/17 130 lb (59 kg)      Vital signs reviewed  08/28/2019  - Note at rest 02 sats  96% on  2.5 lpm     HEENT : pt wearing mask not removed for exam due to covid -19 concerns.    NECK :  without JVD/Nodes/TM/ nl carotid upstrokes bilaterally   LUNGS: no acc muscle use,  Mod barrel  contour chest wall with bilateral  Distant bs s audible wheeze and   without cough on insp or exp maneuvers and mod  Hyperresonant  to  percussion bilaterally     CV:  RRR  no s3 or murmur or increase in P2, and no edema   ABD:  soft and nontender with pos mid insp Hoover's  in the supine position. No bruits or organomegaly appreciated, bowel sounds nl  MS:     ext warm without deformities, calf tenderness, cyanosis or clubbing No obvious joint restrictions   SKIN: warm and dry with Punctate erythematous rash worse below knees but extends to upper things / not trunk or arms   NEURO:  alert, approp, nl sensorium with  no motor or cerebellar deficits apparent.         CXR PA and Lateral:   08/28/2019 :    I personally reviewed images and  impression as follows:  Copd / mild/mod kyphosis                  Assessment & Plan:

## 2019-08-28 NOTE — Patient Instructions (Addendum)
When you renew your insurance pick the policy that covers [place]  Make sure you check your oxygen saturations at highest level of activity to be sure it stays over 90% and adjust upward to maintain this level if needed but remember to turn it back to previous settings when you stop (to conserve your supply).   Try pepcid 20 mg after bfast and supper and stop prilosec to see if rash resolves and if not see Dr Karie Kirks   Please remember to go to the  x-ray department  @  [facility] for your tests - we will call you with the results when they are available     Please schedule a follow up office visit in 6  Months , call sooner if needed

## 2019-08-28 NOTE — Assessment & Plan Note (Signed)
Quit smoking 2013 - PFT's  04/27/12  FEV1 0.70 (26 %) ratio 35  p no % improvement from saba p ? prior to study with DLCO  53 % corrects to 69 % for alv volume with classic curvature   - Alpha one AT screening   07/31/16 >   MM - PFT's  12/31/2016  FEV1 0.74 (28 % ) ratio 38  p no % improvement from saba p saba 4h   prior to study with DLCO  37/38c % corrects to 50 % for alv volume   - 07/02/2017   try bevespi: did not use correctly  - 01/03/2018    try bevespi again > could not afford and could not tell really helped vs duoneb - 05/26/2019  After extensive coaching inhaler device,  effectiveness =    90% with smi > try stiolto> could not afford it on her plan > pharmacy consult placed 08/28/2019     Pt is Group B in terms of symptom/risk and laba/lama therefore appropriate rx at this point >>>  Needs laba/lama and did better on stiolto so need alternative or she needs to change insurance to cover it when renews  In meantime duoneb up to qid will do

## 2019-08-28 NOTE — Assessment & Plan Note (Signed)
08/28/2019  rec try  off ppi and on pepcid 20 mg bid instead > f/u PCP/ derm prn          Each maintenance medication was reviewed in detail including emphasizing most importantly the difference between maintenance and prns and under what circumstances the prns are to be triggered using an action plan format where appropriate.  Total time for H and P, chart review, counseling, teaching device and generating customized AVS unique to this office visit / charting = 41 min

## 2019-08-29 NOTE — Progress Notes (Signed)
LMTCB

## 2019-08-29 NOTE — Progress Notes (Signed)
Spoke with pt and notified of results per Dr. Wert. Pt verbalized understanding and denied any questions. 

## 2019-08-30 ENCOUNTER — Telehealth: Payer: Self-pay | Admitting: Pharmacist

## 2019-08-30 NOTE — Telephone Encounter (Signed)
Please pass this along to pt with number to call

## 2019-08-30 NOTE — Telephone Encounter (Signed)
-----   Message from Tanda Rockers, MD sent at 08/28/2019 12:02 PM EDT ----- This pt really liked stiolto but not covered by her insurance - which one should be plan for next year

## 2019-08-30 NOTE — Telephone Encounter (Signed)
Called and spoke with patient, provided information and phone number.  Patient verbalized understanding.  Nothing further needed.

## 2019-08-30 NOTE — Telephone Encounter (Signed)
Patient has no prescription insurance on file.  Her nebulized medications are covered 80% through Medicare Part B.  She would need to apply for prescription insurance, Medicare Part D, and select a plan that will cover stiolto or apply for patient assistance.'  We can not advise on plans.  Recommend patient call [place]) at [phone] for medicare selection counseling.   Mariella Saa, PharmD, [place], CPP Clinical Specialty Pharmacist (Rheumatology and Pulmonology)  08/30/2019 1:37 PM

## 2019-09-22 ENCOUNTER — Other Ambulatory Visit: Payer: Self-pay

## 2019-09-22 ENCOUNTER — Other Ambulatory Visit: Payer: Medicare Other

## 2019-09-22 DIAGNOSIS — Z20822 Contact with and (suspected) exposure to covid-19: Secondary | ICD-10-CM

## 2019-09-25 ENCOUNTER — Telehealth: Payer: Self-pay | Admitting: *Deleted

## 2019-09-25 LAB — NOVEL CORONAVIRUS, NAA: SARS-CoV-2, NAA: NOT DETECTED

## 2019-09-25 NOTE — Telephone Encounter (Signed)
Pt is aware covid 19 test is neg on 09-25-2019

## 2019-12-26 ENCOUNTER — Telehealth: Payer: Self-pay | Admitting: Internal Medicine

## 2020-01-30 ENCOUNTER — Other Ambulatory Visit: Payer: Medicare PPO

## 2020-01-30 ENCOUNTER — Other Ambulatory Visit: Payer: Self-pay

## 2020-01-30 DIAGNOSIS — Z20822 Contact with and (suspected) exposure to covid-19: Secondary | ICD-10-CM

## 2020-02-01 LAB — SARS-COV-2, NAA 2 DAY TAT

## 2020-02-01 LAB — NOVEL CORONAVIRUS, NAA: SARS-CoV-2, NAA: NOT DETECTED

## 2020-02-08 ENCOUNTER — Telehealth: Payer: Self-pay | Admitting: Internal Medicine

## 2020-02-08 NOTE — Telephone Encounter (Signed)
Spoke with pt . She states that [employer] needs to authorize her oxygen through her new insurance [employer]. Card is scanned into epic.  I called and spoke with Darlina Guys with [employer] and she has on file pt's insurance change to [facility] and doesn't show any red flags on patient's account. She did recommend for pt to be recertified and a new oxygen order placed at pt's upcoming appt with Dr Melvyn Novas. I have added this info to pt's appt notes and also notified pt. Nothing further needed at this time.

## 2020-02-22 NOTE — Telephone Encounter (Signed)
done

## 2020-02-26 ENCOUNTER — Other Ambulatory Visit (HOSPITAL_COMMUNITY): Payer: Self-pay | Admitting: Family Medicine

## 2020-02-26 DIAGNOSIS — M549 Dorsalgia, unspecified: Secondary | ICD-10-CM

## 2020-02-27 ENCOUNTER — Other Ambulatory Visit: Payer: Self-pay

## 2020-02-27 ENCOUNTER — Ambulatory Visit (INDEPENDENT_AMBULATORY_CARE_PROVIDER_SITE_OTHER): Payer: Medicare PPO | Admitting: Internal Medicine

## 2020-02-27 ENCOUNTER — Encounter: Payer: Self-pay | Admitting: Internal Medicine

## 2020-02-27 ENCOUNTER — Ambulatory Visit (HOSPITAL_COMMUNITY)
Admission: RE | Admit: 2020-02-27 | Discharge: 2020-02-27 | Disposition: A | Discharge status: Home / Self Care | Payer: Medicare PPO | Source: Ambulatory Visit | Attending: Family Medicine | Admitting: Family Medicine

## 2020-02-27 DIAGNOSIS — J9612 Chronic respiratory failure with hypercapnia: Secondary | ICD-10-CM | POA: Diagnosis not present

## 2020-02-27 DIAGNOSIS — J449 Chronic obstructive pulmonary disease, unspecified: Secondary | ICD-10-CM | POA: Diagnosis not present

## 2020-02-27 DIAGNOSIS — J9611 Chronic respiratory failure with hypoxia: Secondary | ICD-10-CM | POA: Diagnosis not present

## 2020-02-27 DIAGNOSIS — M549 Dorsalgia, unspecified: Secondary | ICD-10-CM | POA: Insufficient documentation

## 2020-02-27 NOTE — Progress Notes (Signed)
Subjective:    Patient ID: Donna Watkins, female    DOB: 1960/07/17,    MRN: [id number]    Brief patient profile:  32   yowm MM/quit smoking in 2013 with severe copd at baseline last seen in pulmonary clinic  by [facility] 11/2013  GOLD IV criteria with PFTs 05/2012 ith fev1 only 26%, diffusion at 52%    History of Present Illness  07/31/2016 1st Watkins visit/ Donna Watkins   GOLD IV copd/ 02 2lpm 24/7  Chief Complaint  Patient presents with  . Pulmonary Consult    Pt is self referred for COPD. Pt c/o DOE, throat clearing. Pt denies CP/tightness anf recentl f/c/s. Pt states she rarely uses albuterol hfa.   doe -  turns up 3lpm walking but still doe x 50 ft Use scooter at [employer] x 5 years  Sleeps on 4lpm / no am ha / cough or congestion  Uses neb avg bid, very poor  insight into meds/02  Some better breathing p neb saba rec  Call us if you want to be referred to [facility] for pulmonary rehab - in meantime continue 2lpm at rest and 3lpm with activity    Please schedule a follow up visit in 3 months but call sooner if needed with full pfts on return      12/31/2016  f/u ov/Donna Watkins re:  2lpm 24/7  Chief Complaint  Patient presents with  . Follow-up    review PFT results. States breathing has been ok since last visit.    breathing ok at rest but can't walk 300 ft even on up to 3lpm s sob on flat surface slow pace  rec Stay on alb/ipatropium up to 4 x daily    07/02/2017  f/u ov/Donna Watkins re: GOLD IV  / 02 24/7  Chief Complaint  Patient presents with  . Follow-up    Breathing is overall doing well overall. She is using her albuterol inhaler once per wk on average. She uses her albuterol and atrovent 2 x daily on average. She states she has been losing wt unintentionally.    Dyspnea:  Not usually walking more than a few hundred feet on 3lpm / still using scooter at [employer] Cough: not now Sleeping: insomnia no resp complaints  SABA use: about twice dialy duoneb  02: 2lpm x  3lpm walking   rec Plan A = Automatic = Bevespi Take 2 puffs first thing in am and then another 2 puffs about 12 hours later.  Plan B = Backup Only use your albuterol as a rescue medication Plan C = Crisis - only use your albuterol (no ipatropium)  nebulizer if you first try Plan B and it fails to help > ok to use the nebulizer up to every 4 hours but if start needing it regularly call for immediate appointment        07/04/2018  f/u ov/Donna Watkins re:  GOLD IV  / 02 24/7  3lpm at home then 2lpm cont out/ duoneb only  Chief Complaint  Patient presents with  . Follow-up    Breathing is about the same. No new co's. She rarely uses her albuterol inhaler but uses her neb 2 x daily.    Dyspnea:  Always rides scooter / room to room at home on 3lpm  Cough: no Sleeping: 2 pillows/ bed flat  SABA use: duoneb bid / can't afford bevespi and did not note change on vs off it 02: as above  No flares of copd since prior  rec Duoneb (combination of ipatropium and albuterol) is up to 4 x daily  Only use your albuterol as a rescue medication to be used if you can't catch your breath by resting or doing a relaxed purse lip breathing pattern.  - The less you use it, the better it will work when you need it. - Ok to use up to 2 puffs  every 4 hours if you must       05/26/2019  f/u ov/Donna Watkins re: copd IV just on duoneb qid / has had 1st vaccine  Chief Complaint  Patient presents with  . Follow-up    Breathing improved since had televisit with Donna Watkins a month ago. 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    Dyspnea:  Walking mailbox = 100 ft, better if does so right after duoneb  Cough: better  Sleeping: bed flat two pillows  SABA use: rarely 02: 2lpm and up to 2.5 never checks sats rec If nose not better after you finish your antibiotics,  I would recommend ENT referral by your PCP Plan A = Automatic = Always=    Stiolto 2 puffs each am  - NO IPATPROPIUM  Work on inhaler technique  Plan B = Backup (to  supplement plan A, not to replace it) Only use your albuterol inhaler as a rescue medication  Plan C = Crisis (instead of Plan B but only if Plan B stops working) - only use your albuterol nebulizer if you first try Plan B and it fails to help > ok to use the nebulizer up to every 4 hours but if start needing it regularly call for immediate appointment - needs cxr on return    08/28/2019  f/u ov/Donna Watkins/Donna Watkins re: COPD IV/ 02 dep / duoneb  Chief Complaint  Patient presents with  . Follow-up  Dyspnea:  100 ft to mb and back, flat  Cough: none  Sleeping: flat 2 pillows non noct resp cc's  SABA use: duoneb tid  02: sleeps on 3lpm / walks on 2- 3 lpm rec When you renew your insurance pick the policy that covers Donna Watkins Make sure you check your oxygen saturations at highest level of activity  Try pepcid 20 mg after bfast and supper and stop prilosec to see if rash resolves and if not see Donna Watkins      02/27/2020  f/u ov/[place] Watkins/Donna Watkins re: GOLD IV / o2 dep / never took pepcid and rash went away while still on omeprazole  Chief Complaint  Patient presents with  . Follow-up    Needs o2 recert for [employer]. Breathing has been doing well. She is using her albuterol/atrovent nebs together 3-4 x per day and albuterol inhaler once per wk on average.    Dyspnea:  Still walking to MB x 100 ft flat grade / slower pace than nl = MMRC3 = can't walk 100 yards even at a slow pace at a flat grade s stopping due to sob / now walking instead of riding  @ [employer] but still can't do [employer] Cough: none  Sleeping: flat bed/ 2 pillow no noctornal resp symptoms  SABA use: using as maint rx but only tid 02: 3lpm hs and 2-3 lpm rest of the time  Covid status: moderna x 2 06/01/19  Lung cancer screening: n/a    No obvious day to day or daytime variability or assoc excess/ purulent sputum or mucus plugs or hemoptysis or cp or chest  tightness, subjective wheeze or overt sinus or hb symptoms.    Sleeping  without nocturnal  or early am exacerbation  of respiratory  c/o's or need for noct saba. Also denies any obvious fluctuation of symptoms with weather or environmental changes or other aggravating or alleviating factors except as outlined above   No unusual exposure hx or h/o childhood pna/ asthma or knowledge of premature birth.  Current Allergies, Complete Past Medical History, Past Surgical History, Family History, and Social History were reviewed in [employer] record.  ROS  The following are not active complaints unless bolded Hoarseness, sore throat, dysphagia, dental problems, itching, sneezing,  nasal congestion or discharge of excess mucus or purulent secretions, ear ache,   fever, chills, sweats, unintended wt loss or wt gain, classically pleuritic or exertional cp,  orthopnea pnd or arm/hand swelling  or leg swelling, presyncope, palpitations, abdominal pain, anorexia, nausea, vomiting, diarrhea  or change in bowel habits or change in bladder habits, change in stools or change in urine, dysuria, hematuria,  rash, arthralgias, visual complaints, headache, numbness, weakness or ataxia or problems with walking or coordination,  change in mood or  memory.        Current Meds  Medication Sig  . albuterol (PROVENTIL) (2.5 MG/3ML) 0.083% nebulizer solution Take 3 mLs (2.5 mg total) by nebulization every 4 (four) hours as needed for wheezing or shortness of breath.  Marland Kitchen albuterol (VENTOLIN HFA) 108 (90 Base) MCG/ACT inhaler Inhale 2 puffs into the lungs every 6 (six) hours as needed. DX: J44.9  . ALPRAZolam (XANAX) 1 MG tablet Take 1 mg by mouth 4 (four) times daily as needed. For anxiety  . atorvastatin (LIPITOR) 10 MG tablet Take 1 tablet by mouth at bedtime.  . Ferrous Sulfate (IRON PO) Take 1 tablet by mouth daily.  Marland Kitchen ibuprofen (ADVIL) 200 MG tablet Take 200 mg by mouth every 6 (six) hours as needed.  Marland Kitchen ipratropium (ATROVENT) 0.02 % nebulizer solution Take  0.5 mg by nebulization 4 (four) times daily.  . Multiple Vitamins-Minerals (CENTRUM SILVER PO) Take 1 tablet by mouth daily.  Marland Kitchen omeprazole (PRILOSEC) 20 MG capsule Take 20 mg by mouth daily.  . OXYGEN 2lpm 24/7  AHC  . traZODone (DESYREL) 50 MG tablet Take 1 tablet by mouth daily.                   Objective:   Physical Exam     02/27/2020        116 08/28/2019         107  05/26/2019       106  07/04/2018        129 01/03/2018      126  07/02/2017        128 12/31/2016      136   07/31/16 145 lb 9.6 oz (66 kg)  01/22/14 180 lb (81.6 kg)  05/23/13 180 lb 12.8 oz (82 kg)         01/03/18 126 lb (57.2 kg)  07/02/17 128 lb 9.6 oz (58.3 kg)  02/04/17 130 lb (59 kg)      Vital signs reviewed  02/27/2020  - Note at rest 02 sats  85% on RA    General appearance:    Chronically ill wf nad at rest    HEENT : pt wearing mask not removed for exam due to covid -19 concerns.    NECK :  without JVD/Nodes/TM/ nl carotid upstrokes bilaterally   LUNGS:  no acc muscle use,  Mod barrel  contour chest wall with bilateral  Distant bs s audible wheeze and  without cough on insp or exp maneuvers and mod  Hyperresonant  to  percussion bilaterally     CV:  RRR  no s3 or murmur or increase in P2, and no edema   ABD:  soft and nontender with pos mid insp Hoover's  in the supine position. No bruits or organomegaly appreciated, bowel sounds nl  MS:     ext warm without deformities, calf tenderness, cyanosis  - mild/ mod clubbing No obvious joint restrictions   SKIN: warm and dry without lesions    NEURO:  alert, approp, nl sensorium with  no motor or cerebellar deficits apparent.                        Assessment & Plan:

## 2020-02-27 NOTE — Patient Instructions (Addendum)
I very strongly recommend you get the third  moderna vaccine as soon as possible based on your risk of dying from the virus  and the proven safety and benefit of these vaccines against even the delta and omicron variants.    No change medications    Please schedule a follow up visit in 6  months but call sooner if needed

## 2020-02-27 NOTE — Assessment & Plan Note (Signed)
Quit smoking 2013 - PFT's  04/27/12  FEV1 0.70 (26 %) ratio 35  p no % improvement from saba p ? prior to study with DLCO  53 % corrects to 69 % for alv volume with classic curvature   - Alpha one AT screening   07/31/16 >   MM - PFT's  12/31/2016  FEV1 0.74 (28 % ) ratio 38  p no % improvement from saba p saba 4h   prior to study with DLCO  37/38c % corrects to 50 % for alv volume   - 07/02/2017   try bevespi: did not use correctly  - 01/03/2018    try bevespi again > could not afford and could not tell really helped vs duoneb - 05/26/2019  After extensive coaching inhaler device,  effectiveness =    90% with smi > try stiolto> could not afford it on her plan > pharmacy consult placed 08/28/2019     Doing suprisingly well on just duoneb tid so no need to change rx unless starts having aecopd

## 2020-02-27 NOTE — Assessment & Plan Note (Signed)
07/31/2016  Patient Saturations on Room Air at Rest = 88% and while Ambulating = 83% - HC03  07/31/2016  = 38  - 07/31/2016   Walked 3lpm  2 laps @ 185 ft each stopped due to  Sob at moderate pace with no desat  - RA  = 85% 01/03/2018 corrected to 92 on 2lpm continuous  -  05/26/2019   Walked 2lpm cont x  approx   200 ft  @ moderate pace  stopped due to  Sob with sats still 91%   -  08/28/2019   Walked 2.5 lpm  approx   400 ft  @ moderate pace  stopped due to  Sob with sats still   90% - 02 sats at rst 85% RA 02/27/2020   She is using 02 as instructed and benefiting from it / clearly 02 de so for now rx is 3lpm hs and daytime titrate to sats > 90%          Each maintenance medication was reviewed in detail including emphasizing most importantly the difference between maintenance and prns and under what circumstances the prns are to be triggered using an action plan format where appropriate.  Total time for H and P, chart review, counseling, reviewing 02 device(s) and generating customized AVS unique to this office visit / same day charting = 25 min

## 2020-04-10 ENCOUNTER — Other Ambulatory Visit (HOSPITAL_COMMUNITY): Payer: Self-pay | Admitting: Family Medicine

## 2020-04-10 DIAGNOSIS — Z1231 Encounter for screening mammogram for malignant neoplasm of breast: Secondary | ICD-10-CM

## 2020-05-06 ENCOUNTER — Ambulatory Visit (HOSPITAL_COMMUNITY)
Admission: RE | Admit: 2020-05-06 | Discharge: 2020-05-06 | Disposition: A | Discharge status: Home / Self Care | Payer: Medicare PPO | Source: Ambulatory Visit | Attending: Family Medicine | Admitting: Family Medicine

## 2020-05-06 ENCOUNTER — Ambulatory Visit (HOSPITAL_COMMUNITY): Payer: Medicare PPO

## 2020-05-06 ENCOUNTER — Other Ambulatory Visit: Payer: Self-pay

## 2020-05-06 DIAGNOSIS — Z1231 Encounter for screening mammogram for malignant neoplasm of breast: Secondary | ICD-10-CM | POA: Insufficient documentation

## 2020-06-10 ENCOUNTER — Other Ambulatory Visit: Payer: Self-pay

## 2020-06-10 ENCOUNTER — Ambulatory Visit
Admission: EM | Admit: 2020-06-10 | Discharge: 2020-06-10 | Disposition: A | Discharge status: Home / Self Care | Payer: Medicare PPO | Attending: Family Medicine | Admitting: Family Medicine

## 2020-06-10 DIAGNOSIS — H1032 Unspecified acute conjunctivitis, left eye: Secondary | ICD-10-CM | POA: Diagnosis not present

## 2020-06-10 MED ORDER — POLYMYXIN B-TRIMETHOPRIM 10000-0.1 UNIT/ML-% OP SOLN: 1.0000 [drp] | OPHTHALMIC | 0 refills | Status: AC

## 2020-06-10 NOTE — Discharge Instructions (Signed)
Use the antibiotic eyedrops as prescribed.    Follow-up with your eye doctor for a recheck in 1 to 2 days if your symptoms are not improving.    Go to the emergency department if you have acute eye pain, changes in your vision, or other concerning symptoms.    

## 2020-06-10 NOTE — ED Triage Notes (Signed)
Pt presents with left eye irritation , had a stye in April and now appears to have rash on corner of eye with redness and draining

## 2020-06-11 ENCOUNTER — Telehealth: Payer: Self-pay | Admitting: Internal Medicine

## 2020-06-11 NOTE — Telephone Encounter (Signed)
ATC Patient.  LM to call back. 

## 2020-06-11 NOTE — Telephone Encounter (Signed)
I called and spoke with pt and she stated that she was seen yesterday at urgent care for an eye infection.  She stated that this has been going on since April, but had gotten a little worse.  She was prescribed trimethoprim/polymyxin and this was sent to her local pharmacy.  The pharmacy told her they had to order it and they told her today that it should be here tomorrow.  Pt wanted to see if MW would call her in something to another pharmacy.   I advised the pt that she should call the urgent care and ask if they could send the rx to another pharmacy but she requested that the message be sent to MW.  She is also going to call other pharmacies to see if they have this drop in stock.  MW please advise. Thanks.  No Known Allergies

## 2020-06-11 NOTE — Telephone Encounter (Signed)
This is not a pulmonary issue and I'm not comfortable assuming responsibility for an eye problem so advised to contact UC or Primary asap

## 2020-06-12 NOTE — Telephone Encounter (Signed)
Left message for patient to call back  

## 2020-06-16 NOTE — ED Provider Notes (Signed)
[place]   [id number] 06/10/20 Arrival Time: 1646  CC: EYE REDNESS  SUBJECTIVE:  Donna Watkins is a 60 y.o. female who presents with complaint of eye redness that began with a stye about a month ago. States that the stye has since healed and that now the eye is erythematous, itchy and irritated. Denies a precipitating event, trauma, or close contacts with similar symptoms. Has not tried OTC medications for this. There are not aggravating or alleviating factors. Denies similar symptoms in the past. Denies fever, chills, nausea, vomiting, eye pain, painful eye movements, halos, discharge, itching, vision changes, double vision, FB sensation, periorbital erythema.  Denies contact lens use.    ROS: As per HPI.  All other pertinent ROS negative.     Past Medical History:  Diagnosis Date   Anxiety    panic attacks   COPD (chronic obstructive pulmonary disease) ([place]) 03/01/2012   Depression    GERD (gastroesophageal reflux disease)    Hyperlipidemia    Oxygen dependent    Past Surgical History:  Procedure Laterality Date   COLONOSCOPY N/A 09/09/2012   Procedure: COLONOSCOPY;  Surgeon: Danie Binder, MD;  [facility];  Service: Endoscopy;  Laterality: N/A;  1:15-moved to 12:45 Melanie notified pt   TUBAL LIGATION     No Known Allergies No current facility-administered medications on file prior to encounter.   Current Outpatient Medications on File Prior to Encounter  Medication Sig Dispense Refill   albuterol (PROVENTIL) (2.5 MG/3ML) 0.083% nebulizer solution Take 3 mLs (2.5 mg total) by nebulization every 4 (four) hours as needed for wheezing or shortness of breath.     albuterol (VENTOLIN HFA) 108 (90 Base) MCG/ACT inhaler Inhale 2 puffs into the lungs every 6 (six) hours as needed. DX: J44.9 18 g 11   ALPRAZolam (XANAX) 1 MG tablet Take 1 mg by mouth 4 (four) times daily as needed. For anxiety     atorvastatin (LIPITOR) 10 MG tablet Take 1 tablet by mouth  at bedtime.     Ferrous Sulfate (IRON PO) Take 1 tablet by mouth daily.     ibuprofen (ADVIL) 200 MG tablet Take 200 mg by mouth every 6 (six) hours as needed.     ipratropium (ATROVENT) 0.02 % nebulizer solution Take 0.5 mg by nebulization 4 (four) times daily.     Multiple Vitamins-Minerals (CENTRUM SILVER PO) Take 1 tablet by mouth daily.     omeprazole (PRILOSEC) 20 MG capsule Take 20 mg by mouth daily.     OXYGEN 2lpm 24/7  [facility]     traZODone (DESYREL) 50 MG tablet Take 1 tablet by mouth daily.     Social History   Socioeconomic History   Marital status: Divorced    Spouse name: Not on file   Number of children: Not on file   Years of education: Not on file   Highest education level: Not on file  Occupational History   Occupation: Disabled  Tobacco Use   Smoking status: Former    Packs/day: 2.50    Years: 30.00    Pack years: 75.00    Types: Cigarettes    Quit date: 06/12/2011    Years since quitting: 9.0   Smokeless tobacco: Never  Vaping Use   Vaping Use: Never used  Substance and Sexual Activity   Alcohol use: No   Drug use: No   Sexual activity: Not on file  Other Topics Concern   Not on file  Social History Narrative  Disability.    Lives with sister   Social Determinants of Health   Financial Resource Strain: Not on file  Food Insecurity: Not on file  Transportation Needs: Not on file  Physical Activity: Not on file  Stress: Not on file  Social Connections: Not on file  Intimate Partner Violence: Not on file   Family History  Problem Relation Age of Onset   COPD Mother    Heart disease Father    Colon cancer Neg Hx     OBJECTIVE:    Visual Acuity      Vitals:   06/10/20 1755  BP: 124/77  Pulse: 82  Resp: 16  Temp: 98.8 F (37.1 C)  TempSrc: Tympanic  SpO2: 98%    General appearance: alert; no distress Eyes: Left conjunctival and scleral erythema noted, PERRL; EOMI without discomfort;  no obvious drainage; lid everted without obvious  FB; no obvious fluorescein uptake  Neck: supple Lungs: clear to auscultation bilaterally Heart: regular rate and rhythm Skin: warm and dry Psychological: alert and cooperative; normal mood and affect   ASSESSMENT & PLAN:  1. Acute bacterial conjunctivitis of left eye     Meds ordered this encounter  Medications   trimethoprim-polymyxin b (POLYTRIM) ophthalmic solution    Sig: Place 1 drop into the left eye every 4 (four) hours for 7 days.    Dispense:  10 mL    Refill:  0    Order Specific Question:   Supervising Provider    Answer:   Chase Picket [[id number]]     Conjunctivitis Use eye drops as prescribed and to completion Dispose of old contacts and wear glasses until you have finished course of antibiotic eye drops Wash pillow cases, wash hands regularly with soap and water, avoid touching your face and eyes, wash door handles, light switches, remotes and other objects you frequently touch Return or follow up with PCP if symptoms persists such as fever, chills, redness, swelling, eye pain, painful eye movements, vision changes Reviewed expectations re: course of current medical issues. Questions answered. Outlined signs and symptoms indicating need for more acute intervention. Patient verbalized understanding. After Visit Summary given.    Faustino Congress, NP 06/16/20 1526

## 2020-07-02 ENCOUNTER — Other Ambulatory Visit: Payer: Self-pay | Admitting: Family Medicine

## 2020-07-02 DIAGNOSIS — M5134 Other intervertebral disc degeneration, thoracic region: Secondary | ICD-10-CM

## 2020-07-02 DIAGNOSIS — M4124 Other idiopathic scoliosis, thoracic region: Secondary | ICD-10-CM

## 2020-07-15 ENCOUNTER — Other Ambulatory Visit (HOSPITAL_COMMUNITY): Payer: Self-pay | Admitting: Family Medicine

## 2020-07-15 DIAGNOSIS — M4124 Other idiopathic scoliosis, thoracic region: Secondary | ICD-10-CM

## 2020-07-15 DIAGNOSIS — M5134 Other intervertebral disc degeneration, thoracic region: Secondary | ICD-10-CM

## 2020-07-25 ENCOUNTER — Other Ambulatory Visit: Payer: Self-pay

## 2020-07-25 ENCOUNTER — Ambulatory Visit (HOSPITAL_COMMUNITY)
Admission: RE | Admit: 2020-07-25 | Discharge: 2020-07-25 | Disposition: A | Discharge status: Home / Self Care | Payer: Medicare PPO | Source: Ambulatory Visit | Attending: Family Medicine | Admitting: Family Medicine

## 2020-07-25 DIAGNOSIS — M4124 Other idiopathic scoliosis, thoracic region: Secondary | ICD-10-CM | POA: Insufficient documentation

## 2020-07-25 DIAGNOSIS — M5134 Other intervertebral disc degeneration, thoracic region: Secondary | ICD-10-CM | POA: Insufficient documentation

## 2020-07-31 ENCOUNTER — Ambulatory Visit (HOSPITAL_COMMUNITY)
Admission: RE | Admit: 2020-07-31 | Discharge: 2020-07-31 | Disposition: A | Discharge status: Home / Self Care | Payer: Medicare PPO | Source: Ambulatory Visit | Attending: Family Medicine | Admitting: Family Medicine

## 2020-07-31 ENCOUNTER — Other Ambulatory Visit (HOSPITAL_COMMUNITY): Payer: Self-pay | Admitting: Family Medicine

## 2020-07-31 ENCOUNTER — Other Ambulatory Visit: Payer: Self-pay

## 2020-07-31 DIAGNOSIS — J449 Chronic obstructive pulmonary disease, unspecified: Secondary | ICD-10-CM | POA: Diagnosis present

## 2020-07-31 DIAGNOSIS — R937 Abnormal findings on diagnostic imaging of other parts of musculoskeletal system: Secondary | ICD-10-CM | POA: Diagnosis present

## 2020-07-31 MED ORDER — IOHEXOL 300 MG/ML  SOLN
75.0000 mL | Freq: Once | INTRAMUSCULAR | Status: AC | PRN
  Administered 2020-07-31: 75 mL via INTRAVENOUS

## 2020-08-10 NOTE — Progress Notes (Signed)
[facility] [place] [facility] [place] [address] [place] [address]   CLINIC:  Medical Oncology/Hematology  CONSULT NOTE  Patient Care Team: Lemmie Evens, MD as PCP - General (Family Medicine) Brien Mates, RN as Oncology Nurse Navigator (Oncology) Derek Jack, MD as Medical Oncologist (Oncology)  CHIEF COMPLAINTS/PURPOSE OF CONSULTATION:  Evaluation of lung mass  HISTORY OF PRESENTING ILLNESS:  [name] 60 y.o. female is here because of lung mass.  Today she reports feeling well. She reports left mid back pain which has been present for 6 months, and cough productive of clear sputum starting for the past 2 weeks. She has a history of COPD and has been on O2 via nasal cannula for the past 10 years. She denies any unexpected weight loss in the past 6 months, any recent infections, headaches, vision changes, decrease in appetite, or CP.    She has a history of smoking, but she quit 10 years ago in 2013; she smoke 2-3 ppd for about 30 years. She worked at a [employer], and denies any excess or unusual chemical exposure. She lived at home with her sister and is able to do all of her typical at home activities. She denies any family history of cancer.   MEDICAL HISTORY:  Past Medical History:  Diagnosis Date   Anxiety    panic attacks   COPD (chronic obstructive pulmonary disease) ([place]) 03/01/2012   Depression    GERD (gastroesophageal reflux disease)    Hyperlipidemia    Oxygen dependent     SURGICAL HISTORY: Past Surgical History:  Procedure Laterality Date   COLONOSCOPY N/A 09/09/2012   Procedure: COLONOSCOPY;  Surgeon: Danie Binder, MD;  [facility];  Service: Endoscopy;  Laterality: N/A;  1:15-moved to 12:45 Melanie notified pt   TUBAL LIGATION      SOCIAL HISTORY: Social History   Socioeconomic History   Marital status: Divorced    Spouse name: Not on file   Number of children: Not on file   Years of education: Not  on file   Highest education level: Not on file  Occupational History   Occupation: Disabled  Tobacco Use   Smoking status: Former    Packs/day: 2.50    Years: 30.00    Pack years: 75.00    Types: Cigarettes    Quit date: 06/12/2011    Years since quitting: 9.1   Smokeless tobacco: Never  Vaping Use   Vaping Use: Never used  Substance and Sexual Activity   Alcohol use: No   Drug use: No   Sexual activity: Not on file  Other Topics Concern   Not on file  Social History Narrative   Disability.    Lives with sister   Social Determinants of Health   Financial Resource Strain: Low Risk    Difficulty of Paying Living Expenses: Not very hard  Food Insecurity: No Food Insecurity   Worried About Charity fundraiser in the Last Year: Never true   Ran Out of Food in the Last Year: Never true  Transportation Needs: No Transportation Needs   Lack of Transportation (Medical): No   Lack of Transportation (Non-Medical): No  Physical Activity: Inactive   Days of Exercise per Week: 0 days   Minutes of Exercise per Session: 0 min  Stress: No Stress Concern Present   Feeling of Stress : Not at all  Social Connections: Socially Isolated   Frequency of Communication with Friends and Family: More than  three times a week   Frequency of Social Gatherings with Friends and Family: More than three times a week   Attends Religious Services: Never   Marine scientist or Organizations: No   Attends Music therapist: Never   Marital Status: Divorced  Human resources officer Violence: Not At Risk   Fear of Current or Ex-Partner: No   Emotionally Abused: No   Physically Abused: No   Sexually Abused: No    FAMILY HISTORY: Family History  Problem Relation Age of Onset   COPD Mother    Heart disease Father    Colon cancer Neg Hx     ALLERGIES:  has No Known Allergies.  MEDICATIONS:  Current Outpatient Medications  Medication Sig Dispense Refill   alprazolam (XANAX) 2 MG tablet  SMARTSIG:0.5 Tablet(s) By Mouth 5 Times Daily     atorvastatin (LIPITOR) 20 MG tablet Take 20 mg by mouth daily.     cefUROXime (CEFTIN) 500 MG tablet Take 500 mg by mouth 2 (two) times daily.     doxycycline (VIBRA-TABS) 100 MG tablet Take 100 mg by mouth 2 (two) times daily.     Ferrous Sulfate (IRON PO) Take 1 tablet by mouth daily.     ipratropium (ATROVENT) 0.02 % nebulizer solution Take 0.5 mg by nebulization 4 (four) times daily.     Multiple Vitamins-Minerals (CENTRUM SILVER PO) Take 1 tablet by mouth daily.     nystatin cream (MYCOSTATIN) Apply topically.     omeprazole (PRILOSEC) 20 MG capsule Take 20 mg by mouth daily.     OXYGEN 2lpm 24/7  [facility]     traZODone (DESYREL) 50 MG tablet Take 1 tablet by mouth daily.     albuterol (PROVENTIL) (2.5 MG/3ML) 0.083% nebulizer solution Take 3 mLs (2.5 mg total) by nebulization every 4 (four) hours as needed for wheezing or shortness of breath. (Patient not taking: Reported on 08/12/2020)     albuterol (VENTOLIN HFA) 108 (90 Base) MCG/ACT inhaler Inhale 2 puffs into the lungs every 6 (six) hours as needed. DX: J44.9 (Patient not taking: Reported on 08/12/2020) 18 g 11   ibuprofen (ADVIL) 200 MG tablet Take 200 mg by mouth every 6 (six) hours as needed. (Patient not taking: Reported on 08/12/2020)     No current facility-administered medications for this visit.    REVIEW OF SYSTEMS:   Review of Systems  Constitutional:  Negative for appetite change, fatigue (75%) and unexpected weight change.  HENT:   Positive for trouble swallowing (chewing).   Eyes:  Negative for eye problems.  Respiratory:  Positive for cough and shortness of breath (w/ exertion).   Cardiovascular:  Negative for chest pain.  Gastrointestinal:  Positive for constipation.  Musculoskeletal:  Positive for back pain (6/10).  Neurological:  Negative for headaches.  All other systems reviewed and are negative.   PHYSICAL EXAMINATION: ECOG PERFORMANCE STATUS: 1 - Symptomatic but  completely ambulatory  Vitals:   08/12/20 1423  BP: 135/60  Pulse: (!) 101  Resp: 17  Temp: (!) 96.8 F (36 C)  SpO2: 93%   Filed Weights   08/12/20 1423  Weight: 121 lb 14.4 oz (55.3 kg)   Physical Exam Vitals reviewed.  Constitutional:      Appearance: Normal appearance.     Interventions: Nasal cannula in place.     Comments: In wheelchair  Cardiovascular:     Rate and Rhythm: Normal rate and regular rhythm.     Pulses: Normal pulses.  Heart sounds: Normal heart sounds.  Pulmonary:     Effort: Pulmonary effort is normal.     Breath sounds: Normal breath sounds.  Abdominal:     Palpations: Abdomen is soft. There is no hepatomegaly, splenomegaly or mass.     Tenderness: There is no abdominal tenderness.  Musculoskeletal:     Right lower leg: No edema.     Left lower leg: No edema.  Neurological:     General: No focal deficit present.     Mental Status: She is alert and oriented to person, place, and time.  Psychiatric:        Mood and Affect: Mood normal.        Behavior: Behavior normal.     LABORATORY DATA:  I have reviewed the data as listed CBC Latest Ref Rng & Units 07/31/2016 06/27/2012 06/16/2011  WBC 4.0 - 10.5 K/uL 5.6 - 9.3  Hemoglobin 12.0 - 15.0 g/dL 13.4 12.9 17.6(H)  Hematocrit 36.0 - 46.0 % 41.8 40 55.7(H)  Platelets 150.0 - 400.0 K/uL 222.0 - 111(L)   CMP Latest Ref Rng & Units 07/31/2016 06/30/2011 06/15/2011  Glucose 70 - 99 mg/dL 100(H) 109(H) 115(H)  BUN 6 - 23 mg/dL 8 13 8   Creatinine 0.40 - 1.20 mg/dL 0.77 0.5 0.48(L)  Sodium 135 - 145 mEq/L 140 143 142  Potassium 3.5 - 5.1 mEq/L 3.2(L) 4.4 3.8  Chloride 96 - 112 mEq/L 98 99 99  CO2 19 - 32 mEq/L 38(H) 38(H) 38(H)  Calcium 8.4 - 10.5 mg/dL 9.2 8.9 8.6  Total Protein 6.0 - 8.3 g/dL - - -  Total Bilirubin 0.3 - 1.2 mg/dL - - -  Alkaline Phos 39 - 117 U/L - - -  AST 0 - 37 U/L - - -  ALT 0 - 35 U/L - - -    RADIOGRAPHIC STUDIES: I have personally reviewed the radiological images as  listed and agreed with the findings in the report. CT CHEST W CONTRAST  Result Date: 07/31/2020 CLINICAL DATA:  Abnormal MR thoracic spine, possible pulmonary nodule EXAM: CT CHEST WITH CONTRAST TECHNIQUE: Multidetector CT imaging of the chest was performed during intravenous contrast administration. CONTRAST:  17mL OMNIPAQUE IOHEXOL 300 MG/ML  SOLN COMPARISON:  MR thoracic spine, 07/25/2020 FINDINGS: Cardiovascular: No significant vascular findings. Normal heart size. No pericardial effusion. Mediastinum/Nodes: No enlarged mediastinal, hilar, or axillary lymph nodes. Thyroid gland, trachea, and esophagus demonstrate no significant findings. Lungs/Pleura: Severe centrilobular emphysema. There is an irregular nodule of the posteromedial left upper lobe abutting the pleura, measuring 2.4 x 1.6 cm (series 4, image 57). No pleural effusion or pneumothorax. Upper Abdomen: No acute abnormality. Musculoskeletal: No chest wall mass or suspicious bone lesions identified. IMPRESSION: 1. There is an irregular nodule of the posteromedial left upper lobe abutting the pleura, measuring 2.4 x 1.6 cm, as seen on prior MR examination of the thoracic spine and highly concerning for primary lung malignancy. Recommend multidisciplinary thoracic conference referral for consideration of PET-CT metabolic characterization and tissue sampling. 2. No evidence of lymphadenopathy or metastatic disease in the chest. 3. Severe centrilobular emphysema. Emphysema (ICD10-J43.9). Electronically Signed   By: Eddie Candle M.D.   On: 07/31/2020 16:25   MR THORACIC SPINE WO CONTRAST  Result Date: 07/26/2020 CLINICAL DATA:  Upper back pain for over 1 year EXAM: MRI THORACIC SPINE WITHOUT CONTRAST TECHNIQUE: Multiplanar, multisequence MR imaging of the thoracic spine was performed. No intravenous contrast was administered. COMPARISON:  Thoracic spine radiograph 02/27/2020 FINDINGS: Alignment:  Physiologic.  Vertebrae: No fracture, evidence of  discitis, or aggressive bone lesion. Cord: No abnormal cord signal. Paraspinal and other soft tissues: There is consolidative, mass-like signal intensity within the posteromedial aspect of the left upper lung, adjacent to the descending thoracic aorta at the level of T5. This measures approximately 2.3 x 1.5 x 1.7 cm (axial T2 image 16, sagittal stir image 16). There is no evidence of mediastinal invasion or adjacent bony destruction. This is not involve the spinal canal or neural foramen. Additionally, there is reticular high intensity signal in the posterior lung inferior to this as well as a 6 mm nodular hyperintensity in the subpleural lung (axial T2 images 21 and 26 respectively. Disc levels: There is mild multilevel disc desiccation. There is no large disc herniation, significant spinal canal or neural foraminal narrowing. IMPRESSION: Consolidative, masslike signal abnormality within the posteromedial aspect of the left upper lung, adjacent to the descending thoracic aorta at the level of T5, measuring 2.3 x 1.5 x 1.7 cm. Additional reticular signal intensity within the left posterior mid lung and a 6 mm subpleural nodule in the posterior left lower lung. Recommend chest CT for further evaluation. No acute findings in the thoracic spine. These results will be called to the ordering clinician or representative by the Radiologist Assistant, and communication documented in the PACS or [employer]. Electronically Signed   By: Maurine Simmering   On: 07/26/2020 14:20    ASSESSMENT:  1.  Left upper lobe lung nodule: - Complaint of left-sided mid back pain for the last 6 months. - No weight loss reported.  No chest pains. - MRI of the thoracic spine on 07/25/2020 showed incidental left upper lobe nodule measuring 2.3 x 1.5 x 1.7 cm. - CT of the chest with contrast on 07/31/2020 showed irregular nodule of the posteromedial left upper lobe abutting the pleura measuring 2.4 x 1.6 cm.  No pleural effusion or  pneumothorax.  No adenopathy in the chest. - Denies any recent infections or COVID. - She has been on oxygen 24/7 for the last 10 years secondary to COPD.  2.  Social/family history: - She lives with her sister at home and is independent of ADLs and IADLs. - She has been on disability. - She quit smoking 10 years ago.  Smoked 2 to 3 pack/day for 30 years. - No family history of malignancies.   PLAN:  1.  Left upper lobe lung nodule: - We have reviewed CT images with the patient which showed irregular nodule of the left upper lobe abutting the pleura.  This was not present on the CT scan from 2018.  High probability of malignancy given prior smoking history. - Would recommend further work-up with PET scan and possible biopsy.  Will likely need navigational bronchoscopy and biopsy. - Would order MRI of the brain with and without contrast to complete staging work-up. - We will see her back after the PET scan to discuss results and further plan.   All questions were answered. The patient knows to call the clinic with any problems, questions or concerns.    Derek Jack, MD, 08/12/20 4:50 PM  [facility] [phone]   I, Thana Ates, am acting as a scribe for Dr. Derek Jack.  I, Derek Jack MD, have reviewed the above documentation for accuracy and completeness, and I agree with the above.

## 2020-08-12 ENCOUNTER — Other Ambulatory Visit: Payer: Self-pay

## 2020-08-12 ENCOUNTER — Encounter (HOSPITAL_COMMUNITY): Payer: Self-pay | Admitting: Hematology

## 2020-08-12 ENCOUNTER — Inpatient Hospital Stay (HOSPITAL_COMMUNITY): Payer: Medicare PPO | Attending: Hematology | Admitting: Hematology

## 2020-08-12 VITALS — BP 135/60 | HR 101 | Temp 96.8°F | Resp 17 | Ht 63.0 in | Wt 121.9 lb

## 2020-08-12 DIAGNOSIS — Z87891 Personal history of nicotine dependence: Secondary | ICD-10-CM | POA: Insufficient documentation

## 2020-08-12 DIAGNOSIS — J449 Chronic obstructive pulmonary disease, unspecified: Secondary | ICD-10-CM | POA: Diagnosis not present

## 2020-08-12 DIAGNOSIS — R911 Solitary pulmonary nodule: Secondary | ICD-10-CM | POA: Diagnosis present

## 2020-08-12 DIAGNOSIS — C3490 Malignant neoplasm of unspecified part of unspecified bronchus or lung: Secondary | ICD-10-CM

## 2020-08-12 DIAGNOSIS — Z9981 Dependence on supplemental oxygen: Secondary | ICD-10-CM | POA: Diagnosis not present

## 2020-08-12 NOTE — Patient Instructions (Addendum)
[place] at [facility] Discharge Instructions  You were seen and examined today by Dr. Delton Coombes. Dr. Delton Coombes is a medical oncologist, meaning he specializes in the management of cancer diagnoses with medications. Dr. Delton Coombes discussed your past medical history, family history of cancer and the events that led to you being here today.  Dr. Delton Coombes reviewed your recent chest CT scan results, which showed a lung nodule concerning for lung cancer. Dr. Delton Coombes has recommended a PET scan, this is a specialized CT scan that illuminates where there is cancer present in your body. Dr. Delton Coombes has also recommended a brain MRI, this is done on every patient with a newly noted lung nodule.  Dr. Delton Coombes will follow-up with you following the PET scan and brain MRI.   Thank you for choosing [place] at [facility] to provide your oncology and hematology care.  To afford each patient quality time with our provider, please arrive at least 15 minutes before your scheduled appointment time.   If you have a lab appointment with the [facility] please come in thru the Main Entrance and check in at the main information desk.  You need to re-schedule your appointment should you arrive 10 or more minutes late.  We strive to give you quality time with our providers, and arriving late affects you and other patients whose appointments are after yours.  Also, if you no show three or more times for appointments you may be dismissed from the clinic at the providers discretion.     Again, thank you for choosing [facility].  Our hope is that these requests will decrease the amount of time that you wait before being seen by our physicians.       _____________________________________________________________  Should you have questions after your visit to [facility], please contact our office at [phone] and follow the prompts.   Our office hours are 8:00 a.m. and 4:30 p.m. Monday - Friday.  Please note that voicemails left after 4:00 p.m. may not be returned until the following business day.  We are closed weekends and major holidays.  You do have access to a nurse 24-7, just call the main number to the clinic [phone] and do not press any options, hold on the line and a nurse will answer the phone.    For prescription refill requests, have your pharmacy contact our office and allow 72 hours.    Due to Covid, you will need to wear a mask upon entering the hospital. If you do not have a mask, a mask will be given to you at the Main Entrance upon arrival. For doctor visits, patients may have 1 support person age 20 or older with them. For treatment visits, patients can not have anyone with them due to social distancing guidelines and our immunocompromised population.

## 2020-08-14 ENCOUNTER — Other Ambulatory Visit: Payer: Self-pay

## 2020-08-14 ENCOUNTER — Encounter: Payer: Self-pay | Admitting: Internal Medicine

## 2020-08-14 ENCOUNTER — Ambulatory Visit (INDEPENDENT_AMBULATORY_CARE_PROVIDER_SITE_OTHER): Payer: Medicare PPO | Admitting: Internal Medicine

## 2020-08-14 DIAGNOSIS — J9611 Chronic respiratory failure with hypoxia: Secondary | ICD-10-CM

## 2020-08-14 DIAGNOSIS — R911 Solitary pulmonary nodule: Secondary | ICD-10-CM | POA: Diagnosis not present

## 2020-08-14 DIAGNOSIS — J9612 Chronic respiratory failure with hypercapnia: Secondary | ICD-10-CM

## 2020-08-14 DIAGNOSIS — J449 Chronic obstructive pulmonary disease, unspecified: Secondary | ICD-10-CM

## 2020-08-14 NOTE — Assessment & Plan Note (Signed)
07/31/2016  Patient Saturations on Room Air at Rest = 88% and while Ambulating = 83% - HC03  07/31/2016  = 38  - 07/31/2016   Walked 3lpm  2 laps @ 185 ft each stopped due to  Sob at moderate pace with no desat  - RA  = 85% 01/03/2018 corrected to 92 on 2lpm continuous  -  05/26/2019   Walked 2lpm cont x  approx   200 ft  @ moderate pace  stopped due to  Sob with sats still 91%   -  08/28/2019   Walked 2.5 lpm  approx   400 ft  @ moderate pace  stopped due to  Sob with sats still   90% - 02 sats at rst 85% RA 02/27/2020  - 08/14/2020 referred to advance for best fit eval   Again advised: Make sure you check your oxygen saturation  at your highest level of activity  to be sure it stays over 90% and adjust  02 flow upward to maintain this level if needed but remember to turn it back to previous settings when you stop (to conserve your supply).

## 2020-08-14 NOTE — Assessment & Plan Note (Addendum)
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   May be candidate for FNA of most accessible tissue but if not would consider referring to [place] for navigational bx / clearly not a candidate for lobectomy but could probably tolerate stereotactic RT  Discussed in detail all the  indications, usual  risks and alternatives  relative to the benefits with patient who agrees to proceed with Rx as outlined.             Each maintenance medication was reviewed in detail including emphasizing most importantly the difference between maintenance and prns and under what circumstances the prns are to be triggered using an action plan format where appropriate.  Total time for H and P, chart review, counseling, reviewing neb/02 device(s) and generating customized AVS unique to this office visit / same day charting  > 30 min

## 2020-08-14 NOTE — Assessment & Plan Note (Signed)
Quit smoking 2013 - PFT's  04/27/12  FEV1 0.70 (26 %) ratio 35  p no % improvement from saba p ? prior to study with DLCO  53 % corrects to 69 % for alv volume with classic curvature   - Alpha one AT screening   07/31/16 >   MM - PFT's  12/31/2016  FEV1 0.74 (28 % ) ratio 38  p no % improvement from saba p saba 4h   prior to study with DLCO  37/38c % corrects to 50 % for alv volume   - 07/02/2017   try bevespi: did not use correctly  - 01/03/2018    try bevespi again > could not afford and could not tell really helped vs duoneb - 05/26/2019  After extensive coaching inhaler device,  effectiveness =    90% with smi > try stiolto> could not afford it on her plan >   - as of 08/14/2020 maint on duoneb qid   Well compensated but very severe copd/ clearly not an op candidate

## 2020-08-14 NOTE — Progress Notes (Signed)
Subjective:    Patient ID: Donna Watkins, female    DOB: [birth year],    MRN: [id number]    Brief patient profile:  2   yowm MM/quit smoking in 2013 with severe copd at baseline last seen in pulmonary clinic  by [facility] 11/2013  GOLD IV criteria with PFTs 05/2012 ith fev1 only 26%, diffusion at 52%    History of Present Illness  07/31/2016 1st office visit/ Donna Watkins   GOLD IV copd/ 02 2lpm 24/7  Chief Complaint  Patient presents with   Pulmonary Consult    Pt is self referred for COPD. Pt c/o DOE, throat clearing. Pt denies CP/tightness anf recentl f/c/s. Pt states she rarely uses albuterol hfa.   doe -  turns up 3lpm walking but still doe x 50 ft Use scooter at [employer] x 5 years  Sleeps on 4lpm / no am ha / cough or congestion  Uses neb avg bid, very poor  insight into meds/02  Some better breathing p neb saba rec  Call us if you want to be referred to [facility] for pulmonary rehab - in meantime continue 2lpm at rest and 3lpm with activity    Please schedule a follow up visit in 3 months but call sooner if needed with full pfts on return      12/31/2016  f/u ov/Donna Watkins re:  2lpm 24/7  Chief Complaint  Patient presents with   Follow-up    review PFT results. States breathing has been ok since last visit.    breathing ok at rest but can't walk 300 ft even on up to 3lpm s sob on flat surface slow pace  rec Stay on alb/ipatropium up to 4 x daily    07/02/2017  f/u ov/Donna Watkins re: GOLD IV  / 02 24/7  Chief Complaint  Patient presents with   Follow-up    Breathing is overall doing well overall. She is using her albuterol inhaler once per wk on average. She uses her albuterol and atrovent 2 x daily on average. She states she has been losing wt unintentionally.    Dyspnea:  Not usually walking more than a few hundred feet on 3lpm / still using scooter at [employer] Cough: not now Sleeping: insomnia no resp complaints  SABA use: about twice dialy duoneb  02: 2lpm x  3lpm walking   rec Plan A = Automatic = Bevespi Take 2 puffs first thing in am and then another 2 puffs about 12 hours later.  Plan B = Backup Only use your albuterol as a rescue medication Plan C = Crisis - only use your albuterol (no ipatropium)  nebulizer if you first try Plan B and it fails to help > ok to use the nebulizer up to every 4 hours but if start needing it regularly call for immediate appointment        07/04/2018  f/u ov/Donna Watkins re:  GOLD IV  / 02 24/7  3lpm at home then 2lpm cont out/ duoneb only  Chief Complaint  Patient presents with   Follow-up    Breathing is about the same. No new co's. She rarely uses her albuterol inhaler but uses her neb 2 x daily.    Dyspnea:  Always rides scooter / room to room at home on 3lpm  Cough: no Sleeping: 2 pillows/ bed flat  SABA use: duoneb bid / can't afford bevespi and did not note change on vs off it 02: as above  No flares of copd since prior  rec Duoneb (combination of ipatropium and albuterol) is up to 4 x daily  Only use your albuterol as a rescue medication      02/27/2020  f/u ov/[facility] office/Donna Watkins re: GOLD IV / o2 dep / never took pepcid and rash went away while still on omeprazole  Chief Complaint  Patient presents with   Follow-up    Needs o2 recert for [employer]. Breathing has been doing well. She is using her albuterol/atrovent nebs together 3-4 x per day and albuterol inhaler once per wk on average.    Dyspnea:  Still walking to MB x 100 ft flat grade / slower pace than nl = MMRC3 = can't walk 100 yards even at a slow pace at a flat grade s stopping due to sob / now walking instead of riding  @ [employer] but still can't do wm Cough: none  Sleeping: flat bed/ 2 pillow no noctornal resp symptoms  SABA use: using as maint rx but only tid 02: 3lpm hs and 2-3 lpm rest of the time  Covid status: moderna x 2 06/01/19  Lung cancer screening: n/a  Rec  I very strongly recommend you get the third  moderna vaccine  No  change medications    08/14/2020  f/u ov/[place] office/Donna Watkins re: GOLD IV COPD/ 02 dep/ maint on duoneb as can't afford bevespi   Chief Complaint  Patient presents with   Follow-up    Breathing is unchanged. She has minimal dry cough. She states having PET scan soon to eval nodules found on CT done to eval back pain.   Onset of back pain in April 2022   Dyspnea:  still able to get to mb  - does not check sats while walking  Cough: none  Sleeping: 2 pillows/ flat bed  SABA use: just the qid duoneb 02: 3lpm hs and mostly on 3lpm  Covid status: vax x 2      No obvious day to day or daytime variability or assoc excess/ purulent sputum or mucus plugs or hemoptysis or cp or chest tightness, subjective wheeze or overt sinus or hb symptoms.   Sleeping  without nocturnal  or early am exacerbation  of respiratory  c/o's or need for noct saba. Also denies any obvious fluctuation of symptoms with weather or environmental changes or other aggravating or alleviating factors except as outlined above   No unusual exposure hx or h/o childhood pna/ asthma or knowledge of premature birth.  Current Allergies, Complete Past Medical History, Past Surgical History, Family History, and Social History were reviewed in [employer] record.  ROS  The following are not active complaints unless bolded Hoarseness, sore throat, dysphagia, dental problems, itching, sneezing,  nasal congestion or discharge of excess mucus or purulent secretions, ear ache,   fever, chills, sweats, unintended wt loss or wt gain, classically pleuritic or exertional cp,  orthopnea pnd or arm/hand swelling  or leg swelling, presyncope, palpitations, abdominal pain, anorexia, nausea, vomiting, diarrhea  or change in bowel habits or change in bladder habits, change in stools or change in urine, dysuria, hematuria,  rash, arthralgias/ lower T spine pain, visual complaints, headache, numbness, weakness or ataxia or problems  with walking or coordination,  change in mood or  memory.        Current Meds  Medication Sig   albuterol (PROVENTIL) (2.5 MG/3ML) 0.083% nebulizer solution Take 3 mLs (2.5 mg total) by nebulization every 4 (four) hours as needed  for wheezing or shortness of breath.   albuterol (VENTOLIN HFA) 108 (90 Base) MCG/ACT inhaler Inhale 2 puffs into the lungs every 6 (six) hours as needed. DX: J44.9   alprazolam (XANAX) 2 MG tablet SMARTSIG:0.5 Tablet(s) By Mouth 5 Times Daily   atorvastatin (LIPITOR) 20 MG tablet Take 20 mg by mouth daily.   ibuprofen (ADVIL) 200 MG tablet Take 200 mg by mouth every 6 (six) hours as needed.   ipratropium (ATROVENT) 0.02 % nebulizer solution Take 0.5 mg by nebulization 4 (four) times daily.   Multiple Vitamins-Minerals (CENTRUM SILVER PO) Take 1 tablet by mouth daily.   nystatin cream (MYCOSTATIN) Apply topically.   omeprazole (PRILOSEC) 20 MG capsule Take 20 mg by mouth daily.   OXYGEN 2lpm 24/7  AHC   traZODone (DESYREL) 50 MG tablet Take 1 tablet by mouth daily.                      Objective:   Physical Exam    08/14/2020         120  02/27/2020         116 08/28/2019         107  05/26/2019       106  07/04/2018        129 01/03/2018      126  07/02/2017        128 12/31/2016      136   07/31/16 145 lb 9.6 oz (66 kg)  01/22/14 180 lb (81.6 kg)  05/23/13 180 lb 12.8 oz (82 kg)      01/03/18 126 lb (57.2 kg)  07/02/17 128 lb 9.6 oz (58.3 kg)  02/04/17 130 lb (59 kg)       Vital signs reviewed  08/14/2020  - Note at rest 02 sats  92% on  2lpm cont    General appearance:    alert amb chronically ill appearing wf nad    HEENT : pt wearing mask not removed for exam due to covid -19 concerns.    NECK :  without JVD/Nodes/TM/ nl carotid upstrokes bilaterally   LUNGS: no acc muscle use,  Mod barrel  contour chest wall with bilateral  Distant bs s audible wheeze and  without cough on insp or exp maneuvers and mod  Hyperresonant  to  percussion  bilaterally     CV:  RRR  no s3 or murmur or increase in P2, and no edema   ABD:  soft and nontender with pos mid insp Hoover's  in the supine position. No bruits or organomegaly appreciated, bowel sounds nl  MS:     ext warm without deformities, calf tenderness, cyanosis - mod clubbing No obvious joint restrictions   SKIN: warm and dry without lesions    NEURO:  alert, approp, nl sensorium with  no motor or cerebellar deficits apparent.                    I personally reviewed images and agree with radiology impression as follows:   Chest CT w contrast 07/31/20 1. There is an irregular nodule of the posteromedial left upper lobe abutting the pleura, measuring 2.4 x 1.6 cm, as seen on prior MR examination of the thoracic spine and highly concerning for primary lung malignancy. Recommend multidisciplinary thoracic conference referral for consideration of PET-CT metabolic characterization and tissue sampling. 2. No evidence of lymphadenopathy or metastatic disease in the chest. 3. Severe centrilobular emphysema  Assessment & Plan:

## 2020-08-14 NOTE — Patient Instructions (Signed)
You will likely need a lung biopsy of some type and I would be happy to help you decide if Dr Karie Kirks wishes but the PET should tell best option   Make sure you check your oxygen saturation  at your highest level of activity  to be sure it stays over 90% and adjust  02 flow upward to maintain this level if needed but remember to turn it back to previous settings when you stop (to conserve your supply).   Please schedule a follow up visit in 6  months but call sooner if needed

## 2020-08-22 ENCOUNTER — Ambulatory Visit (HOSPITAL_COMMUNITY)
Admission: RE | Admit: 2020-08-22 | Discharge: 2020-08-22 | Disposition: A | Discharge status: Home / Self Care | Payer: Medicare PPO | Source: Ambulatory Visit | Attending: Hematology | Admitting: Hematology

## 2020-08-22 ENCOUNTER — Other Ambulatory Visit: Payer: Self-pay

## 2020-08-22 DIAGNOSIS — R911 Solitary pulmonary nodule: Secondary | ICD-10-CM

## 2020-08-22 DIAGNOSIS — C3490 Malignant neoplasm of unspecified part of unspecified bronchus or lung: Secondary | ICD-10-CM | POA: Insufficient documentation

## 2020-08-23 ENCOUNTER — Ambulatory Visit (HOSPITAL_COMMUNITY)
Admission: RE | Admit: 2020-08-23 | Discharge: 2020-08-23 | Disposition: A | Discharge status: Home / Self Care | Payer: Medicare PPO | Source: Ambulatory Visit | Attending: Hematology | Admitting: Hematology

## 2020-08-23 DIAGNOSIS — C3490 Malignant neoplasm of unspecified part of unspecified bronchus or lung: Secondary | ICD-10-CM | POA: Diagnosis not present

## 2020-08-23 MED ORDER — GADOBUTROL 1 MMOL/ML IV SOLN
5.5000 mL | Freq: Once | INTRAVENOUS | Status: AC | PRN
  Administered 2020-08-23: 5.5 mL via INTRAVENOUS

## 2020-08-27 ENCOUNTER — Ambulatory Visit (HOSPITAL_COMMUNITY): Payer: Medicare PPO | Admitting: Hematology

## 2020-09-04 ENCOUNTER — Ambulatory Visit (HOSPITAL_COMMUNITY): Payer: Medicare PPO | Admitting: Hematology

## 2020-09-05 ENCOUNTER — Encounter (HOSPITAL_COMMUNITY): Payer: Self-pay

## 2020-09-05 ENCOUNTER — Encounter (HOSPITAL_COMMUNITY): Admission: RE | Admit: 2020-09-05 | Payer: Medicare PPO | Source: Ambulatory Visit

## 2020-09-10 ENCOUNTER — Ambulatory Visit (HOSPITAL_COMMUNITY): Payer: Medicare PPO | Admitting: Hematology

## 2020-09-19 ENCOUNTER — Other Ambulatory Visit: Payer: Self-pay

## 2020-09-19 ENCOUNTER — Encounter (HOSPITAL_COMMUNITY)
Admission: RE | Admit: 2020-09-19 | Discharge: 2020-09-19 | Disposition: A | Discharge status: Home / Self Care | Payer: Medicare PPO | Source: Ambulatory Visit | Attending: Hematology | Admitting: Hematology

## 2020-09-19 DIAGNOSIS — C3490 Malignant neoplasm of unspecified part of unspecified bronchus or lung: Secondary | ICD-10-CM | POA: Diagnosis present

## 2020-09-19 DIAGNOSIS — R911 Solitary pulmonary nodule: Secondary | ICD-10-CM | POA: Diagnosis not present

## 2020-09-19 MED ORDER — FLUDEOXYGLUCOSE F - 18 (FDG) INJECTION
6.8620 | Freq: Once | INTRAVENOUS | Status: AC | PRN
  Administered 2020-09-19: 6.862 via INTRAVENOUS

## 2020-09-23 NOTE — Progress Notes (Signed)
Donna Watkins, [place] [address]   CLINIC:  Medical Oncology/Hematology  PCP:  Lemmie Evens, MD [address] / [place] [address] [phone]   REASON FOR VISIT:  Follow-up for left upper lobe lung nodule  PRIOR THERAPY: none  NGS Results: not done  CURRENT THERAPY: under work-up  BRIEF ONCOLOGIC HISTORY:  Oncology History   No history exists.    CANCER STAGING: Cancer Staging No matching staging information was found for the patient.  INTERVAL HISTORY:  [name], a 60 y.o. female, returns for routine follow-up of her left upper lobe lung nodule. Donna Watkins was last seen on 08/12/2020.   Today she reports feeling well, and she is accompanied by her sister. She reports good appetite. She denies CP or SOB. She reports a cough productive of clear sputum.   REVIEW OF SYSTEMS:  Review of Systems  Constitutional:  Positive for fatigue (50%). Negative for appetite change.  Respiratory:  Positive for cough. Negative for shortness of breath.   Cardiovascular:  Negative for chest pain.  All other systems reviewed and are negative.  PAST MEDICAL/SURGICAL HISTORY:  Past Medical History:  Diagnosis Date   Anxiety    panic attacks   COPD (chronic obstructive pulmonary disease) ([place]) 03/01/2012   Depression    GERD (gastroesophageal reflux disease)    Hyperlipidemia    Oxygen dependent    Past Surgical History:  Procedure Laterality Date   COLONOSCOPY N/A 09/09/2012   Procedure: COLONOSCOPY;  Surgeon: Danie Binder, MD;  Location: [facility];  Service: Endoscopy;  Laterality: N/A;  1:15-moved to 12:45 Melanie notified pt   TUBAL LIGATION      SOCIAL HISTORY:  Social History   Socioeconomic History   Marital status: Divorced    Spouse name: Not on file   Number of children: Not on file   Years of education: Not on file   Highest education level: Not on file  Occupational History   Occupation: Disabled   Tobacco Use   Smoking status: Former    Packs/day: 2.50    Years: 30.00    Pack years: 75.00    Types: Cigarettes    Quit date: 06/12/2011    Years since quitting: 9.2   Smokeless tobacco: Never  Vaping Use   Vaping Use: Never used  Substance and Sexual Activity   Alcohol use: No   Drug use: No   Sexual activity: Not on file  Other Topics Concern   Not on file  Social History Narrative   Disability.    Lives with sister   Social Determinants of Health   Financial Resource Strain: Low Risk    Difficulty of Paying Living Expenses: Not very hard  Food Insecurity: No Food Insecurity   Worried About Charity fundraiser in the Last Year: Never true   Ran Out of Food in the Last Year: Never true  Transportation Needs: No Transportation Needs   Lack of Transportation (Medical): No   Lack of Transportation (Non-Medical): No  Physical Activity: Inactive   Days of Exercise per Week: 0 days   Minutes of Exercise per Session: 0 min  Stress: No Stress Concern Present   Feeling of Stress : Not at all  Social Connections: Socially Isolated   Frequency of Communication with Friends and Family: More than three times a week   Frequency of Social Gatherings with Friends and Family: More than three times a week   Attends  Religious Services: Never   Active Member of Clubs or Organizations: No   Attends Archivist Meetings: Never   Marital Status: Divorced  Human resources officer Violence: Not At Risk   Fear of Current or Ex-Partner: No   Emotionally Abused: No   Physically Abused: No   Sexually Abused: No    FAMILY HISTORY:  Family History  Problem Relation Age of Onset   COPD Mother    Heart disease Father    Colon cancer Neg Hx     CURRENT MEDICATIONS:  Current Outpatient Medications  Medication Sig Dispense Refill   albuterol (PROVENTIL) (2.5 MG/3ML) 0.083% nebulizer solution Take 3 mLs (2.5 mg total) by nebulization every 4 (four) hours as needed for wheezing or  shortness of breath.     albuterol (VENTOLIN HFA) 108 (90 Base) MCG/ACT inhaler Inhale 2 puffs into the lungs every 6 (six) hours as needed. DX: J44.9 18 g 11   alprazolam (XANAX) 2 MG tablet SMARTSIG:0.5 Tablet(s) By Mouth 5 Times Daily     atorvastatin (LIPITOR) 20 MG tablet Take 20 mg by mouth daily.     ibuprofen (ADVIL) 200 MG tablet Take 200 mg by mouth every 6 (six) hours as needed.     ipratropium (ATROVENT) 0.02 % nebulizer solution Take 0.5 mg by nebulization 4 (four) times daily.     Multiple Vitamins-Minerals (CENTRUM SILVER PO) Take 1 tablet by mouth daily.     nystatin cream (MYCOSTATIN) Apply topically.     omeprazole (PRILOSEC) 20 MG capsule Take 20 mg by mouth daily.     OXYGEN 2lpm 24/7  AHC     traZODone (DESYREL) 50 MG tablet Take 1 tablet by mouth daily.     No current facility-administered medications for this visit.    ALLERGIES:  No Known Allergies  PHYSICAL EXAM:  Performance status (ECOG): 1 - Symptomatic but completely ambulatory  There were no vitals filed for this visit. Wt Readings from Last 3 Encounters:  08/14/20 120 lb (54.4 kg)  08/12/20 121 lb 14.4 oz (55.3 kg)  02/27/20 116 lb (52.6 kg)   Physical Exam Vitals reviewed.  Constitutional:      Appearance: Normal appearance.     Interventions: Nasal cannula in place.  Cardiovascular:     Rate and Rhythm: Normal rate and regular rhythm.     Pulses: Normal pulses.     Heart sounds: Normal heart sounds.  Pulmonary:     Effort: Pulmonary effort is normal.     Breath sounds: Normal breath sounds.  Neurological:     General: No focal deficit present.     Mental Status: She is alert and oriented to person, place, and time.  Psychiatric:        Mood and Affect: Mood normal.        Behavior: Behavior normal.     LABORATORY DATA:  I have reviewed the labs as listed.  CBC Latest Ref Rng & Units 07/31/2016 06/27/2012 06/16/2011  WBC 4.0 - 10.5 K/uL 5.6 - 9.3  Hemoglobin 12.0 - 15.0 g/dL 13.4 12.9  17.6(H)  Hematocrit 36.0 - 46.0 % 41.8 40 55.7(H)  Platelets 150.0 - 400.0 K/uL 222.0 - 111(L)   CMP Latest Ref Rng & Units 07/31/2016 06/30/2011 06/15/2011  Glucose 70 - 99 mg/dL 100(H) 109(H) 115(H)  BUN 6 - 23 mg/dL 8 13 8   Creatinine 0.40 - 1.20 mg/dL 0.77 0.5 0.48(L)  Sodium 135 - 145 mEq/L 140 143 142  Potassium 3.5 - 5.1 mEq/L 3.2(L) 4.4  3.8  Chloride 96 - 112 mEq/L 98 99 99  CO2 19 - 32 mEq/L 38(H) 38(H) 38(H)  Calcium 8.4 - 10.5 mg/dL 9.2 8.9 8.6  Total Protein 6.0 - 8.3 g/dL - - -  Total Bilirubin 0.3 - 1.2 mg/dL - - -  Alkaline Phos 39 - 117 U/L - - -  AST 0 - 37 U/L - - -  ALT 0 - 35 U/L - - -    DIAGNOSTIC IMAGING:  I have independently reviewed the scans and discussed with the patient. NM PET Image Initial (PI) Skull Base To Thigh  Result Date: 09/20/2020 CLINICAL DATA:  Initial treatment strategy for left lower lobe pulmonary nodule. EXAM: NUCLEAR MEDICINE PET SKULL BASE TO THIGH TECHNIQUE: 6.862 mCi F-18 FDG was injected intravenously. Full-ring PET imaging was performed from the skull base to thigh after the radiotracer. CT data was obtained and used for attenuation correction and anatomic localization. Fasting blood glucose: 117 mg/dl COMPARISON:  Chest CT July 31, 2020 and CT abdomen June 25, 2017 FINDINGS: Mediastinal blood pool activity: SUV max 2.5 Liver activity: SUV max NA NECK: No hypermetabolic lymph nodes in the neck. Incidental CT findings: none CHEST: Hypermetabolic irregular pulmonary nodule in the posteromedial left upper lobe abutting the pleura which measures 2.4 x 2.0 cm on image 92/7 with a max SUV of 72.53 No hypermetabolic mediastinal or hilar lymph nodes. Incidental CT findings: Severe centrilobular emphysema. No pleural effusion. No pneumothorax. Left lower lobe mucoid impaction with subsegmental atelectasis. ABDOMEN/PELVIS: No abnormal hypermetabolic activity within the liver, pancreas, adrenal glands, or spleen. No hypermetabolic lymph nodes in the  abdomen or pelvis. Incidental CT findings: Porcelain gallbladder, similar prior. Unremarkable noncontrast appearance of the liver, pancreas, spleen, kidneys and adrenal glands. No evidence of bowel obstruction. Moderate volume of formed stool throughout the colon with fecalized loops of distal small bowel, suggestive of slow transit. SKELETON: No focal hypermetabolic activity to suggest skeletal metastasis. Incidental CT findings: Multilevel degenerative changes spine. IMPRESSION: Hypermetabolic irregular 2.4 cm left upper lobe pulmonary nodule, most consistent with primary bronchogenic neoplasm. No evidence of metastatic disease in the neck, chest, abdomen or pelvis. Aortic Atherosclerosis (ICD10-I70.0) and Emphysema (ICD10-J43.9). Electronically Signed   By: Dahlia Bailiff M.D.   On: 09/20/2020 12:24     ASSESSMENT:  1.  Left upper lobe lung nodule: - Complaint of left-sided mid back pain for the last 6 months. - No weight loss reported.  No chest pains. - MRI of the thoracic spine on 07/25/2020 showed incidental left upper lobe nodule measuring 2.3 x 1.5 x 1.7 cm. - CT of the chest with contrast on 07/31/2020 showed irregular nodule of the posteromedial left upper lobe abutting the pleura measuring 2.4 x 1.6 cm.  No pleural effusion or pneumothorax.  No adenopathy in the chest. - Denies any recent infections or COVID. - She has been on oxygen 24/7 for the last 10 years secondary to COPD.  2.  Social/family history: - She lives with her sister at home and is independent of ADLs and IADLs. - She has been on disability. - She quit smoking 10 years ago.  Smoked 2 to 3 pack/day for 30 years. - No family history of malignancies.   PLAN:  1.  Left upper lobe lung nodule: -We have reviewed images of the PET scan with the patient and her sister in detail.  Hypermetabolic lung nodule in the posteromedial left upper lobe abutting the pleura which measures 2.5 x 2.0 cm with SUV 12.32.  No evidence of  metastatic disease. - We reviewed MRI of the brain results from 08/23/2020 which showed no evidence of metastatic disease. - Based on her lung performance status, she will not be a candidate for surgery. - I have recommended possible navigational bronchoscopy and biopsy by Dr. Valeta Harms. - RTC after biopsy.  Will consider SBRT at that time.   Orders placed this encounter:  No orders of the defined types were placed in this encounter.    Derek Jack, MD [place] [phone]   I, Thana Ates, am acting as a scribe for Dr. Derek Jack.  I, Derek Jack MD, have reviewed the above documentation for accuracy and completeness, and I agree with the above.

## 2020-09-24 ENCOUNTER — Other Ambulatory Visit: Payer: Self-pay

## 2020-09-24 ENCOUNTER — Inpatient Hospital Stay (HOSPITAL_COMMUNITY): Payer: Medicare PPO | Attending: Hematology | Admitting: Hematology

## 2020-09-24 ENCOUNTER — Encounter (HOSPITAL_COMMUNITY): Payer: Self-pay | Admitting: Hematology

## 2020-09-24 VITALS — BP 117/75 | HR 106 | Temp 97.2°F | Resp 18 | Wt 116.6 lb

## 2020-09-24 DIAGNOSIS — Z87891 Personal history of nicotine dependence: Secondary | ICD-10-CM | POA: Insufficient documentation

## 2020-09-24 DIAGNOSIS — J449 Chronic obstructive pulmonary disease, unspecified: Secondary | ICD-10-CM | POA: Insufficient documentation

## 2020-09-24 DIAGNOSIS — C3490 Malignant neoplasm of unspecified part of unspecified bronchus or lung: Secondary | ICD-10-CM

## 2020-09-24 DIAGNOSIS — R911 Solitary pulmonary nodule: Secondary | ICD-10-CM | POA: Diagnosis not present

## 2020-09-24 DIAGNOSIS — Z9981 Dependence on supplemental oxygen: Secondary | ICD-10-CM | POA: Diagnosis not present

## 2020-09-24 NOTE — Patient Instructions (Addendum)
[place] at [facility] Discharge Instructions  You were seen today by Dr. Delton Coombes. He went over your recent results and scans. You will be referred to Dr. Valeta Harms for a biopsy of your left lung. Dr. Delton Coombes will see you back in after your biopsy for follow up.   Thank you for choosing [place] at [facility] to provide your oncology and hematology care.  To afford each patient quality time with our provider, please arrive at least 15 minutes before your scheduled appointment time.   If you have a lab appointment with the [facility] please come in thru the Main Entrance and check in at the main information desk  You need to re-schedule your appointment should you arrive 10 or more minutes late.  We strive to give you quality time with our providers, and arriving late affects you and other patients whose appointments are after yours.  Also, if you no show three or more times for appointments you may be dismissed from the clinic at the providers discretion.     Again, thank you for choosing [facility].  Our hope is that these requests will decrease the amount of time that you wait before being seen by our physicians.       _____________________________________________________________  Should you have questions after your visit to [facility], please contact our office at [phone] between the hours of 8:00 a.m. and 4:30 p.m.  Voicemails left after 4:00 p.m. will not be returned until the following business day.  For prescription refill requests, have your pharmacy contact our office and allow 72 hours.    Cancer Center Support Programs:   > Cancer Support Group  2nd Tuesday of the month 1pm-2pm, Journey Room

## 2020-10-01 ENCOUNTER — Telehealth: Payer: Self-pay | Admitting: Internal Medicine

## 2020-10-01 MED ORDER — CEFUROXIME AXETIL 500 MG PO TABS: 500.0000 mg | ORAL_TABLET | Freq: Two times a day (BID) | ORAL | 0 refills | Status: DC

## 2020-10-01 NOTE — Telephone Encounter (Signed)
That's fine x 10 day but if getting worse will need to find another dentist or go to ER for xrays

## 2020-10-01 NOTE — Telephone Encounter (Signed)
Spoke with the pt and notified of response per Dr Melvyn Novas  Pt verbalized understanding  Rx was sent

## 2020-10-01 NOTE — Telephone Encounter (Signed)
Pt states she is having tooth pain, thinks she needs an abx. Pt states her Dentist is not in this week and neither is her pcp. States she has used antibiotic cefuroxine 500 mg 2X day in the past. Wants to know if Dr. Melvyn Novas can prescribe this to her since her PCP and dentist are both out of the office this week?   Dr. Melvyn Novas please advise if you're okay with this?

## 2020-10-02 ENCOUNTER — Telehealth: Payer: Self-pay | Admitting: Pulmonary Disease

## 2020-10-02 NOTE — Telephone Encounter (Signed)
Pt decided to sched appt w/ RB

## 2020-10-04 ENCOUNTER — Institutional Professional Consult (permissible substitution): Payer: Medicare PPO | Admitting: Emergency Medicine

## 2020-10-16 ENCOUNTER — Ambulatory Visit (INDEPENDENT_AMBULATORY_CARE_PROVIDER_SITE_OTHER): Payer: Medicare PPO | Admitting: Emergency Medicine

## 2020-10-16 ENCOUNTER — Encounter: Payer: Self-pay | Admitting: Emergency Medicine

## 2020-10-16 ENCOUNTER — Other Ambulatory Visit: Payer: Self-pay

## 2020-10-16 VITALS — BP 126/76 | HR 77 | Temp 97.9°F | Ht 63.0 in | Wt 118.2 lb

## 2020-10-16 DIAGNOSIS — J9612 Chronic respiratory failure with hypercapnia: Secondary | ICD-10-CM

## 2020-10-16 DIAGNOSIS — J449 Chronic obstructive pulmonary disease, unspecified: Secondary | ICD-10-CM | POA: Diagnosis not present

## 2020-10-16 DIAGNOSIS — J9611 Chronic respiratory failure with hypoxia: Secondary | ICD-10-CM | POA: Diagnosis not present

## 2020-10-16 DIAGNOSIS — R911 Solitary pulmonary nodule: Secondary | ICD-10-CM | POA: Diagnosis not present

## 2020-10-16 NOTE — H&P (View-Only) (Signed)
Subjective:    Patient ID: Donna Watkins, female    DOB: [birth year], 60 y.o.   MRN: [id number]  HPI 60 year old former smoker (75 pack years) with a history of COPD and associated hypoxemic respiratory failure on 2-2.5 L/min followed by Dr. Melvyn Novas in our office.  Also with anxiety/depression, GERD.  She has been seen by Dr. Delton Coombes with oncology for a left upper lobe pulmonary nodule that was found incidentally when she was having an MRI of the thoracic spine for left-sided back pain. She has albuterol, uses nebs about 3x a day.   CT chest 07/31/2020 shows an irregular posterior medial left upper lobe pulmonary nodule 2.4 x 1.6 cm. PET scan 09/19/2020 reviewed by me, shows 2.4 x 2.0 cm irregular left upper lobe nodule abutting the pleura with strong hypermetabolism (SUV 12.32) there are no notable mediastinal or hilar lymph nodes.  She has associated severe centrilobular emphysema.   Review of Systems As per HPI  Past Medical History:  Diagnosis Date   Anxiety    panic attacks   COPD (chronic obstructive pulmonary disease) ([place]) 03/01/2012   Depression    GERD (gastroesophageal reflux disease)    Hyperlipidemia    Oxygen dependent      Family History  Problem Relation Age of Onset   COPD Mother    Heart disease Father    Colon cancer Neg Hx      Social History   Socioeconomic History   Marital status: Divorced    Spouse name: Not on file   Number of children: Not on file   Years of education: Not on file   Highest education level: Not on file  Occupational History   Occupation: Disabled  Tobacco Use   Smoking status: Former    Packs/day: 2.50    Years: 30.00    Pack years: 75.00    Types: Cigarettes    Quit date: 06/12/2011    Years since quitting: 9.4   Smokeless tobacco: Never  Vaping Use   Vaping Use: Never used  Substance and Sexual Activity   Alcohol use: No   Drug use: No   Sexual activity: Not on file  Other Topics Concern   Not on file  Social  History Narrative   Disability.    Lives with sister   Social Determinants of Health   Financial Resource Strain: Low Risk    Difficulty of Paying Living Expenses: Not very hard  Food Insecurity: No Food Insecurity   Worried About Charity fundraiser in the Last Year: Never true   Ran Out of Food in the Last Year: Never true  Transportation Needs: No Transportation Needs   Lack of Transportation (Medical): No   Lack of Transportation (Non-Medical): No  Physical Activity: Inactive   Days of Exercise per Week: 0 days   Minutes of Exercise per Session: 0 min  Stress: No Stress Concern Present   Feeling of Stress : Not at all  Social Connections: Socially Isolated   Frequency of Communication with Friends and Family: More than three times a week   Frequency of Social Gatherings with Friends and Family: More than three times a week   Attends Religious Services: Never   Marine scientist or Organizations: No   Attends Archivist Meetings: Never   Marital Status: Divorced  Human resources officer Violence: Not At Risk   Fear of Current or Ex-Partner: No   Emotionally Abused: No   Physically Abused: No   Sexually  Abused: No     No Known Allergies   Outpatient Medications Prior to Visit  Medication Sig Dispense Refill   albuterol (PROVENTIL) (2.5 MG/3ML) 0.083% nebulizer solution Take 3 mLs (2.5 mg total) by nebulization every 4 (four) hours as needed for wheezing or shortness of breath. (Patient taking differently: Take 2.5 mg by nebulization in the morning, at noon, in the evening, and at bedtime. Mixes with Ipratropium solution)     albuterol (VENTOLIN HFA) 108 (90 Base) MCG/ACT inhaler Inhale 2 puffs into the lungs every 6 (six) hours as needed. DX: J44.9 (Patient taking differently: Inhale 2 puffs into the lungs every 6 (six) hours as needed for wheezing or shortness of breath.) 18 g 11   alprazolam (XANAX) 2 MG tablet Take 1 mg by mouth See admin instructions. 1mg  four to  five times daily     atorvastatin (LIPITOR) 20 MG tablet Take 20 mg by mouth daily.     cefUROXime (CEFTIN) 500 MG tablet Take 1 tablet (500 mg total) by mouth 2 (two) times daily with a meal. (Patient not taking: No sig reported) 20 tablet 0   ibuprofen (ADVIL) 200 MG tablet Take 200 mg by mouth every 6 (six) hours as needed for mild pain.     ipratropium (ATROVENT) 0.02 % nebulizer solution Take 0.5 mg by nebulization 4 (four) times daily. Mixes with Albuterol solution     Multiple Vitamins-Minerals (CENTRUM SILVER PO) Take 1 tablet by mouth daily.     nystatin cream (MYCOSTATIN) Apply 1 application topically 2 (two) times daily as needed (rash).     omeprazole (PRILOSEC) 20 MG capsule Take 20 mg by mouth daily.     OXYGEN Place 2 L into the nose continuous.     traZODone (DESYREL) 50 MG tablet Take 50 mg by mouth at bedtime.     No facility-administered medications prior to visit.          Objective:   Physical Exam Vitals:   10/16/20 1112  BP: 126/76  Pulse: 77  Temp: 97.9 F (36.6 C)  TempSrc: Oral  SpO2: 96%  Weight: 118 lb 3.2 oz (53.6 kg)  Height: 5\' 3"  (1.6 m)   Gen: Pleasant, thin, in no distress,  normal affect  ENT: No lesions,  mouth clear,  oropharynx clear, no postnasal drip  Neck: No JVD, no stridor  Lungs: No use of accessory muscles, no crackles or wheezing on normal respiration, no wheeze on forced expiration  Cardiovascular: RRR, heart sounds normal, no murmur or gallops, no peripheral edema  Musculoskeletal: No deformities, no cyanosis or clubbing  Neuro: alert, awake, non focal  Skin: Warm, no lesions or rash      Assessment & Plan:  Pulmonary nodule seen on imaging study Posterior medial left upper lobe pulmonary nodule with strong hypermetabolism, no notable mediastinal or hilar lymphadenopathy.  Moderate to high risk for bronchoscopy due to her emphysema, hypoxemia.  Discussed risk and benefits and she elects to proceed with navigational  bronchoscopy.  We will work on getting this scheduled.  We will arrange for bronchoscopy to further evaluate your pulmonary nodule.  We will try to get this scheduled for 11/04/2020.  This will be done under general anesthesia as an outpatient at [facility] endoscopy.  You will need a designated driver. We will need to repeat your CT scan of the chest to prepare for your procedure. Follow with Dr Lamonte Sakai in 1 month  COPD  GOLD IV  Currently undertreated.  Continue her  albuterol for now but she needs to be on scheduled bronchodilator therapy.  We will work on this going forward.  Chronic respiratory failure with hypoxia and hypercapnia (HCC) Continue current oxygen 2-2.5 L/min.   Baltazar Apo, MD, PhD 11/04/2020, 7:06 AM [facility] [phone] or if no answer before 7:00PM call [phone] For any issues after 7:00PM please call eLink [phone]

## 2020-10-16 NOTE — Progress Notes (Signed)
Subjective:    Patient ID: Donna Watkins, female    DOB: January 29, 1960, 60 y.o.   MRN: [id number]  HPI 60 year old former smoker (75 pack years) with a history of COPD and associated hypoxemic respiratory failure on 2-2.5 L/min followed by Dr. Melvyn Novas in our office.  Also with anxiety/depression, GERD.  She has been seen by Dr. Delton Coombes with oncology for a left upper lobe pulmonary nodule that was found incidentally when she was having an MRI of the thoracic spine for left-sided back pain. She has albuterol, uses nebs about 3x a day.   CT chest 07/31/2020 shows an irregular posterior medial left upper lobe pulmonary nodule 2.4 x 1.6 cm. PET scan 09/19/2020 reviewed by me, shows 2.4 x 2.0 cm irregular left upper lobe nodule abutting the pleura with strong hypermetabolism (SUV 12.32) there are no notable mediastinal or hilar lymph nodes.  She has associated severe centrilobular emphysema.   Review of Systems As per HPI  Past Medical History:  Diagnosis Date   Anxiety    panic attacks   COPD (chronic obstructive pulmonary disease) ([place]) 03/01/2012   Depression    GERD (gastroesophageal reflux disease)    Hyperlipidemia    Oxygen dependent      Family History  Problem Relation Age of Onset   COPD Mother    Heart disease Father    Colon cancer Neg Hx      Social History   Socioeconomic History   Marital status: Divorced    Spouse name: Not on file   Number of children: Not on file   Years of education: Not on file   Highest education level: Not on file  Occupational History   Occupation: Disabled  Tobacco Use   Smoking status: Former    Packs/day: 2.50    Years: 30.00    Pack years: 75.00    Types: Cigarettes    Quit date: 06/12/2011    Years since quitting: 9.4   Smokeless tobacco: Never  Vaping Use   Vaping Use: Never used  Substance and Sexual Activity   Alcohol use: No   Drug use: No   Sexual activity: Not on file  Other Topics Concern   Not on file  Social  History Narrative   Disability.    Lives with sister   Social Determinants of Health   Financial Resource Strain: Low Risk    Difficulty of Paying Living Expenses: Not very hard  Food Insecurity: No Food Insecurity   Worried About Charity fundraiser in the Last Year: Never true   Ran Out of Food in the Last Year: Never true  Transportation Needs: No Transportation Needs   Lack of Transportation (Medical): No   Lack of Transportation (Non-Medical): No  Physical Activity: Inactive   Days of Exercise per Week: 0 days   Minutes of Exercise per Session: 0 min  Stress: No Stress Concern Present   Feeling of Stress : Not at all  Social Connections: Socially Isolated   Frequency of Communication with Friends and Family: More than three times a week   Frequency of Social Gatherings with Friends and Family: More than three times a week   Attends Religious Services: Never   Marine scientist or Organizations: No   Attends Archivist Meetings: Never   Marital Status: Divorced  Human resources officer Violence: Not At Risk   Fear of Current or Ex-Partner: No   Emotionally Abused: No   Physically Abused: No   Sexually  Abused: No     No Known Allergies   Outpatient Medications Prior to Visit  Medication Sig Dispense Refill   albuterol (PROVENTIL) (2.5 MG/3ML) 0.083% nebulizer solution Take 3 mLs (2.5 mg total) by nebulization every 4 (four) hours as needed for wheezing or shortness of breath. (Patient taking differently: Take 2.5 mg by nebulization in the morning, at noon, in the evening, and at bedtime. Mixes with Ipratropium solution)     albuterol (VENTOLIN HFA) 108 (90 Base) MCG/ACT inhaler Inhale 2 puffs into the lungs every 6 (six) hours as needed. DX: J44.9 (Patient taking differently: Inhale 2 puffs into the lungs every 6 (six) hours as needed for wheezing or shortness of breath.) 18 g 11   alprazolam (XANAX) 2 MG tablet Take 1 mg by mouth See admin instructions. 1mg  four to  five times daily     atorvastatin (LIPITOR) 20 MG tablet Take 20 mg by mouth daily.     cefUROXime (CEFTIN) 500 MG tablet Take 1 tablet (500 mg total) by mouth 2 (two) times daily with a meal. (Patient not taking: No sig reported) 20 tablet 0   ibuprofen (ADVIL) 200 MG tablet Take 200 mg by mouth every 6 (six) hours as needed for mild pain.     ipratropium (ATROVENT) 0.02 % nebulizer solution Take 0.5 mg by nebulization 4 (four) times daily. Mixes with Albuterol solution     Multiple Vitamins-Minerals (CENTRUM SILVER PO) Take 1 tablet by mouth daily.     nystatin cream (MYCOSTATIN) Apply 1 application topically 2 (two) times daily as needed (rash).     omeprazole (PRILOSEC) 20 MG capsule Take 20 mg by mouth daily.     OXYGEN Place 2 L into the nose continuous.     traZODone (DESYREL) 50 MG tablet Take 50 mg by mouth at bedtime.     No facility-administered medications prior to visit.          Objective:   Physical Exam Vitals:   10/16/20 1112  BP: 126/76  Pulse: 77  Temp: 97.9 F (36.6 C)  TempSrc: Oral  SpO2: 96%  Weight: 118 lb 3.2 oz (53.6 kg)  Height: 5\' 3"  (1.6 m)   Gen: Pleasant, thin, in no distress,  normal affect  ENT: No lesions,  mouth clear,  oropharynx clear, no postnasal drip  Neck: No JVD, no stridor  Lungs: No use of accessory muscles, no crackles or wheezing on normal respiration, no wheeze on forced expiration  Cardiovascular: RRR, heart sounds normal, no murmur or gallops, no peripheral edema  Musculoskeletal: No deformities, no cyanosis or clubbing  Neuro: alert, awake, non focal  Skin: Warm, no lesions or rash      Assessment & Plan:  Pulmonary nodule seen on imaging study Posterior medial left upper lobe pulmonary nodule with strong hypermetabolism, no notable mediastinal or hilar lymphadenopathy.  Moderate to high risk for bronchoscopy due to her emphysema, hypoxemia.  Discussed risk and benefits and she elects to proceed with navigational  bronchoscopy.  We will work on getting this scheduled.  We will arrange for bronchoscopy to further evaluate your pulmonary nodule.  We will try to get this scheduled for 11/04/2020.  This will be done under general anesthesia as an outpatient at [facility] endoscopy.  You will need a designated driver. We will need to repeat your CT scan of the chest to prepare for your procedure. Follow with Dr Lamonte Sakai in 1 month  COPD  GOLD IV  Currently undertreated.  Continue her  albuterol for now but she needs to be on scheduled bronchodilator therapy.  We will work on this going forward.  Chronic respiratory failure with hypoxia and hypercapnia (HCC) Continue current oxygen 2-2.5 L/min.   Baltazar Apo, MD, PhD 11/04/2020, 7:06 AM  [facility] [phone] or if no answer before 7:00PM call [phone] For any issues after 7:00PM please call eLink [phone]

## 2020-10-16 NOTE — Patient Instructions (Signed)
We will arrange for bronchoscopy to further evaluate your pulmonary nodule.  We will try to get this scheduled for 11/04/2020.  This will be done under general anesthesia as an outpatient at [facility] endoscopy.  You will need a designated driver. We will need to repeat your CT scan of the chest to prepare for your procedure. Continue to use your albuterol nebulizers and inhalers as you have been doing.  We talked today about starting a maintenance inhaler.  We will hold off for now but may decide to consider going forward. Continue your oxygen at 2-2.5 L/min as you have been using it Follow with Dr Lamonte Sakai in 1 month

## 2020-10-22 ENCOUNTER — Ambulatory Visit (HOSPITAL_COMMUNITY): Payer: Medicare PPO | Admitting: Hematology

## 2020-10-30 ENCOUNTER — Ambulatory Visit (HOSPITAL_COMMUNITY): Payer: Medicare PPO | Admitting: Hematology

## 2020-10-31 ENCOUNTER — Encounter (HOSPITAL_COMMUNITY): Payer: Self-pay | Admitting: Emergency Medicine

## 2020-10-31 NOTE — Progress Notes (Signed)
PCP - Dr. Karie Kirks Cardiologist - denies EKG -  Chest x-ray -  ECHO -  Cardiac Cath -  CPAP -   ERAS Protcol - n/a  COVID TEST- 11/01/20  Anesthesia review: n/a  -------------  SDW INSTRUCTIONS:  Your procedure is scheduled on Monday 10/31. Please report to [facility] Main Entrance "A" at 0645 A.M., and check in at the Admitting office. Call this number if you have problems the morning of surgery: [phone]   Remember: Do not eat or drink after midnight the night before your surgery  Medications to take morning of surgery with a sip of water include:  If needed: Inhaler --- Please bring all inhalers with you the day of surgery.  alprazolam Duanne Moron)   As of today, STOP taking any Aspirin (unless otherwise instructed by your surgeon), Aleve, Naproxen, Ibuprofen, Motrin, Advil, Goody's, BC's, all herbal medications, fish oil, and all vitamins.    The Morning of Surgery Do not wear jewelry, make-up or nail polish. Do not wear lotions, powders, or perfumes, or deodorant  Do not bring valuables to the hospital. [facility] is not responsible for any belongings or valuables.  If you are a smoker, DO NOT Smoke 24 hours prior to surgery  If you wear a CPAP at night please bring your mask the morning of surgery   Remember that you must have someone to transport you home after your surgery, and remain with you for 24 hours if you are discharged the same day.  Please bring cases for contacts, glasses, hearing aids, dentures or bridgework because it cannot be worn into surgery.   Patients discharged the day of surgery will not be allowed to drive home.   Please shower the NIGHT BEFORE/MORNING OF SURGERY (use antibacterial soap like DIAL soap if possible). Wear comfortable clothes the morning of surgery. Oral Hygiene is also important to reduce your risk of infection.  Remember - BRUSH YOUR TEETH THE MORNING OF SURGERY WITH YOUR REGULAR TOOTHPASTE  Patient denies shortness of  breath, fever, cough and chest pain.

## 2020-11-01 ENCOUNTER — Other Ambulatory Visit: Payer: Self-pay | Admitting: Emergency Medicine

## 2020-11-01 ENCOUNTER — Other Ambulatory Visit: Payer: Self-pay

## 2020-11-01 ENCOUNTER — Encounter (HOSPITAL_COMMUNITY): Payer: Self-pay

## 2020-11-01 ENCOUNTER — Ambulatory Visit (HOSPITAL_COMMUNITY)
Admission: RE | Admit: 2020-11-01 | Discharge: 2020-11-01 | Disposition: A | Discharge status: Home / Self Care | Payer: Medicare PPO | Source: Ambulatory Visit | Attending: Emergency Medicine | Admitting: Emergency Medicine

## 2020-11-01 DIAGNOSIS — R911 Solitary pulmonary nodule: Secondary | ICD-10-CM | POA: Diagnosis not present

## 2020-11-02 LAB — SARS CORONAVIRUS 2 (TAT 6-24 HRS): SARS Coronavirus 2: NEGATIVE

## 2020-11-04 ENCOUNTER — Ambulatory Visit (HOSPITAL_COMMUNITY): Payer: Medicare PPO

## 2020-11-04 ENCOUNTER — Ambulatory Visit (HOSPITAL_COMMUNITY)
Admission: RE | Admit: 2020-11-04 | Discharge: 2020-11-04 | Disposition: A | Discharge status: Home / Self Care | Payer: Medicare PPO | Attending: Emergency Medicine | Admitting: Emergency Medicine

## 2020-11-04 ENCOUNTER — Encounter (HOSPITAL_COMMUNITY)
Admission: RE | Disposition: A | Discharge status: Home / Self Care | Payer: Self-pay | Source: Home / Self Care | Attending: Emergency Medicine

## 2020-11-04 ENCOUNTER — Other Ambulatory Visit: Payer: Self-pay

## 2020-11-04 ENCOUNTER — Encounter (HOSPITAL_COMMUNITY): Payer: Self-pay | Admitting: Emergency Medicine

## 2020-11-04 ENCOUNTER — Ambulatory Visit (HOSPITAL_COMMUNITY): Payer: Medicare PPO | Admitting: Registered Nurse

## 2020-11-04 DIAGNOSIS — Z87891 Personal history of nicotine dependence: Secondary | ICD-10-CM | POA: Diagnosis not present

## 2020-11-04 DIAGNOSIS — E785 Hyperlipidemia, unspecified: Secondary | ICD-10-CM | POA: Insufficient documentation

## 2020-11-04 DIAGNOSIS — R911 Solitary pulmonary nodule: Secondary | ICD-10-CM | POA: Diagnosis present

## 2020-11-04 DIAGNOSIS — C3432 Malignant neoplasm of lower lobe, left bronchus or lung: Secondary | ICD-10-CM | POA: Insufficient documentation

## 2020-11-04 DIAGNOSIS — Z9981 Dependence on supplemental oxygen: Secondary | ICD-10-CM | POA: Diagnosis not present

## 2020-11-04 DIAGNOSIS — Z9889 Other specified postprocedural states: Secondary | ICD-10-CM

## 2020-11-04 DIAGNOSIS — J449 Chronic obstructive pulmonary disease, unspecified: Secondary | ICD-10-CM | POA: Diagnosis not present

## 2020-11-04 DIAGNOSIS — K219 Gastro-esophageal reflux disease without esophagitis: Secondary | ICD-10-CM | POA: Diagnosis not present

## 2020-11-04 DIAGNOSIS — Z419 Encounter for procedure for purposes other than remedying health state, unspecified: Secondary | ICD-10-CM

## 2020-11-04 HISTORY — PX: VIDEO BRONCHOSCOPY WITH ENDOBRONCHIAL NAVIGATION: SHX6175

## 2020-11-04 HISTORY — PX: FIDUCIAL MARKER PLACEMENT: SHX6858

## 2020-11-04 HISTORY — PX: BRONCHIAL BRUSHINGS: SHX5108

## 2020-11-04 HISTORY — PX: BRONCHIAL NEEDLE ASPIRATION BIOPSY: SHX5106

## 2020-11-04 HISTORY — PX: BRONCHIAL BIOPSY: SHX5109

## 2020-11-04 LAB — APTT: aPTT: 26 seconds (ref 24–36)

## 2020-11-04 LAB — CBC
HCT: 39.3 % (ref 36.0–46.0)
Hemoglobin: 12.3 g/dL (ref 12.0–15.0)
MCH: 30.4 pg (ref 26.0–34.0)
MCHC: 31.3 g/dL (ref 30.0–36.0)
MCV: 97.3 fL (ref 80.0–100.0)
Platelets: 180 10*3/uL (ref 150–400)
RBC: 4.04 MIL/uL (ref 3.87–5.11)
RDW: 11.4 % — ABNORMAL LOW (ref 11.5–15.5)
WBC: 5.6 10*3/uL (ref 4.0–10.5)
nRBC: 0 % (ref 0.0–0.2)

## 2020-11-04 LAB — PROTIME-INR
INR: 0.9 (ref 0.8–1.2)
Prothrombin Time: 12.4 seconds (ref 11.4–15.2)

## 2020-11-04 SURGERY — VIDEO BRONCHOSCOPY WITH ENDOBRONCHIAL NAVIGATION
Anesthesia: General

## 2020-11-04 MED ORDER — OXYCODONE HCL 5 MG/5ML PO SOLN: 5.0000 mg | Freq: Once | ORAL | Status: DC | PRN

## 2020-11-04 MED ORDER — LIDOCAINE 2% (20 MG/ML) 5 ML SYRINGE
INTRAMUSCULAR | Status: DC | PRN
  Administered 2020-11-04: 50 mg via INTRAVENOUS

## 2020-11-04 MED ORDER — CHLORHEXIDINE GLUCONATE 0.12 % MT SOLN: 15.0000 mL | OROMUCOSAL | Status: AC

## 2020-11-04 MED ORDER — ACETAMINOPHEN 10 MG/ML IV SOLN: 1000.0000 mg | Freq: Once | INTRAVENOUS | Status: DC | PRN

## 2020-11-04 MED ORDER — PHENYLEPHRINE HCL-NACL 20-0.9 MG/250ML-% IV SOLN
INTRAVENOUS | Status: DC | PRN
  Administered 2020-11-04: 30 ug/min via INTRAVENOUS

## 2020-11-04 MED ORDER — ALBUTEROL SULFATE HFA 108 (90 BASE) MCG/ACT IN AERS: 2.0000 | INHALATION_SPRAY | Freq: Four times a day (QID) | RESPIRATORY_TRACT | Status: DC | PRN

## 2020-11-04 MED ORDER — PROPOFOL 10 MG/ML IV BOLUS
INTRAVENOUS | Status: DC | PRN
  Administered 2020-11-04: 100 mg via INTRAVENOUS

## 2020-11-04 MED ORDER — FENTANYL CITRATE (PF) 100 MCG/2ML IJ SOLN: 25.0000 ug | INTRAMUSCULAR | Status: DC | PRN

## 2020-11-04 MED ORDER — ALBUTEROL SULFATE (2.5 MG/3ML) 0.083% IN NEBU: 2.5000 mg | INHALATION_SOLUTION | Freq: Four times a day (QID) | RESPIRATORY_TRACT | Status: DC

## 2020-11-04 MED ORDER — LACTATED RINGERS IV SOLN: INTRAVENOUS | Status: DC

## 2020-11-04 MED ORDER — SUGAMMADEX SODIUM 200 MG/2ML IV SOLN
INTRAVENOUS | Status: DC | PRN
  Administered 2020-11-04 (×2): 100 mg via INTRAVENOUS

## 2020-11-04 MED ORDER — OXYCODONE HCL 5 MG PO TABS: 5.0000 mg | ORAL_TABLET | Freq: Once | ORAL | Status: DC | PRN

## 2020-11-04 MED ORDER — ONDANSETRON HCL 4 MG/2ML IJ SOLN
INTRAMUSCULAR | Status: DC | PRN
  Administered 2020-11-04: 4 mg via INTRAVENOUS

## 2020-11-04 MED ORDER — CHLORHEXIDINE GLUCONATE 0.12 % MT SOLN
OROMUCOSAL | Status: AC
  Administered 2020-11-04: 15 mL via OROMUCOSAL
  Filled 2020-11-04: qty 15

## 2020-11-04 MED ORDER — DEXAMETHASONE SODIUM PHOSPHATE 10 MG/ML IJ SOLN
INTRAMUSCULAR | Status: DC | PRN
  Administered 2020-11-04: 4 mg via INTRAVENOUS

## 2020-11-04 MED ORDER — AMISULPRIDE (ANTIEMETIC) 5 MG/2ML IV SOLN: 10.0000 mg | Freq: Once | INTRAVENOUS | Status: DC | PRN

## 2020-11-04 MED ORDER — PHENYLEPHRINE 40 MCG/ML (10ML) SYRINGE FOR IV PUSH (FOR BLOOD PRESSURE SUPPORT)
PREFILLED_SYRINGE | INTRAVENOUS | Status: DC | PRN
  Administered 2020-11-04: 120 ug via INTRAVENOUS
  Administered 2020-11-04: 80 ug via INTRAVENOUS

## 2020-11-04 MED ORDER — ROCURONIUM BROMIDE 10 MG/ML (PF) SYRINGE
PREFILLED_SYRINGE | INTRAVENOUS | Status: DC | PRN
  Administered 2020-11-04: 30 mg via INTRAVENOUS
  Administered 2020-11-04: 50 mg via INTRAVENOUS

## 2020-11-04 MED ORDER — FENTANYL CITRATE (PF) 100 MCG/2ML IJ SOLN
INTRAMUSCULAR | Status: DC | PRN
  Administered 2020-11-04: 100 ug via INTRAVENOUS

## 2020-11-04 MED ORDER — PROMETHAZINE HCL 25 MG/ML IJ SOLN: 6.2500 mg | INTRAMUSCULAR | Status: DC | PRN

## 2020-11-04 SURGICAL SUPPLY — 1 items: superlock fiducial marker ×3 IMPLANT

## 2020-11-04 NOTE — Anesthesia Postprocedure Evaluation (Signed)
Anesthesia Post Note  Patient: Donna Watkins  Procedure(s) Performed: ROBOTIC  VIDEO BRONCHOSCOPY WITH ENDOBRONCHIAL NAVIGATION BRONCHIAL BRUSHINGS BRONCHIAL BIOPSIES BRONCHIAL NEEDLE ASPIRATION BIOPSIES FIDUCIAL MARKER PLACEMENT     Patient location during evaluation: PACU Anesthesia Type: General Level of consciousness: awake Pain management: pain level controlled Vital Signs Assessment: post-procedure vital signs reviewed and stable Respiratory status: spontaneous breathing, nonlabored ventilation, respiratory function stable and patient connected to nasal cannula oxygen Cardiovascular status: blood pressure returned to baseline and stable Postop Assessment: no apparent nausea or vomiting Anesthetic complications: no   No notable events documented.  Last Vitals:  Vitals:   11/04/20 1127 11/04/20 1142  BP: 105/60 122/77  Pulse: 83 85  Resp: 20 17  Temp:  36.7 C  SpO2: 96% 96%    Last Pain:  Vitals:   11/04/20 1142  TempSrc:   PainSc: 0-No pain                 Gideon Burstein P Brendalee Matthies

## 2020-11-04 NOTE — Interval H&P Note (Signed)
History and Physical Interval Note:  11/04/2020 7:16 AM  Donna Watkins  has presented today for surgery, with the diagnosis of LEFT UPPERLOPE PULMONARY NODULE.  The various methods of treatment have been discussed with the patient and family. After consideration of risks, benefits and other options for treatment, the patient has consented to  Procedure(s): ROBOTIC  [place] (N/A) as a surgical intervention.  The patient's history has been reviewed, patient examined, no change in status, stable for surgery.  I have reviewed the patient's chart and labs.  Questions were answered to the patient's satisfaction.     Collene Gobble

## 2020-11-04 NOTE — Assessment & Plan Note (Signed)
Posterior medial left upper lobe pulmonary nodule with strong hypermetabolism, no notable mediastinal or hilar lymphadenopathy.  Moderate to high risk for bronchoscopy due to her emphysema, hypoxemia.  Discussed risk and benefits and she elects to proceed with navigational bronchoscopy.  We will work on getting this scheduled.  We will arrange for bronchoscopy to further evaluate your pulmonary nodule.  We will try to get this scheduled for 11/04/2020.  This will be done under general anesthesia as an outpatient at [facility] endoscopy.  You will need a designated driver. We will need to repeat your CT scan of the chest to prepare for your procedure. Follow with Dr Lamonte Sakai in 1 month

## 2020-11-04 NOTE — Assessment & Plan Note (Signed)
Continue current oxygen 2-2.5 L/min.

## 2020-11-04 NOTE — Anesthesia Procedure Notes (Signed)
Procedure Name: Intubation Date/Time: 11/04/2020 9:47 AM Performed by: Trinna Post., CRNA Pre-anesthesia Checklist: Patient identified, Emergency Drugs available, Suction available, Patient being monitored and Timeout performed Patient Re-evaluated:Patient Re-evaluated prior to induction Oxygen Delivery Method: Circle system utilized Preoxygenation: Pre-oxygenation with 100% oxygen Induction Type: IV induction Ventilation: Mask ventilation without difficulty Laryngoscope Size: Mac and 3 Grade View: Grade I Tube type: Oral Tube size: 8.5 mm Number of attempts: 1 Airway Equipment and Method: Stylet Placement Confirmation: ETT inserted through vocal cords under direct vision, positive ETCO2 and breath sounds checked- equal and bilateral Secured at: 22 cm Tube secured with: Tape Dental Injury: Teeth and Oropharynx as per pre-operative assessment

## 2020-11-04 NOTE — Discharge Instructions (Signed)
Flexible Bronchoscopy, Care After This sheet gives you information about how to care for yourself after your test. Your doctor may also give you more specific instructions. If you have problems or questions, contact your doctor. Follow these instructions at home: Eating and drinking Do not eat or drink anything (not even water) for 2 hours after your test, or until your numbing medicine (local anesthetic) wears off. When your numbness is gone and your cough and gag reflexes have come back, you may: Eat only soft foods. Slowly drink liquids. The day after the test, go back to your normal diet. Driving Do not drive for 24 hours if you were given a medicine to help you relax (sedative). Do not drive or use heavy machinery while taking prescription pain medicine. General instructions  Take over-the-counter and prescription medicines only as told by your doctor. Return to your normal activities as told. Ask what activities are safe for you. Do not use any products that have nicotine or tobacco in them. This includes cigarettes and e-cigarettes. If you need help quitting, ask your doctor. Keep all follow-up visits as told by your doctor. This is important. It is very important if you had a tissue sample (biopsy) taken. Get help right away if: You have shortness of breath that gets worse. You get light-headed. You feel like you are going to pass out (faint). You have chest pain. You cough up: More than a little blood. More blood than before. Summary Do not eat or drink anything (not even water) for 2 hours after your test, or until your numbing medicine wears off. Do not use cigarettes. Do not use e-cigarettes. Get help right away if you have chest pain.  Please call our office for any questions or concerns.  [phone].  This information is not intended to replace advice given to you by your health care provider. Make sure you discuss any questions you have with your health care  provider. Document Released: 10/19/2008 Document Revised: 12/04/2016 Document Reviewed: 01/10/2016 Elsevier Patient Education  2020 [employer].

## 2020-11-04 NOTE — Anesthesia Preprocedure Evaluation (Signed)
Anesthesia Evaluation  Patient identified by MRN, date of birth, ID band Patient awake    Reviewed: Allergy & Precautions, NPO status , Patient's Chart, lab work & pertinent test results  Airway Mallampati: II  TM Distance: >3 FB Neck ROM: Full    Dental  (+) Missing, Edentulous Lower   Pulmonary COPD,  COPD inhaler and oxygen dependent, former smoker,    Pulmonary exam normal breath sounds clear to auscultation       Cardiovascular negative cardio ROS Normal cardiovascular exam Rhythm:Regular Rate:Normal     Neuro/Psych PSYCHIATRIC DISORDERS Anxiety Depression negative neurological ROS     GI/Hepatic Neg liver ROS, GERD  Medicated and Controlled,  Endo/Other  negative endocrine ROS  Renal/GU negative Renal ROS     Musculoskeletal negative musculoskeletal ROS (+)   Abdominal   Peds  Hematology HLD   Anesthesia Other Findings  LEFT UPPER LOPE PULMONARY NODULE  Reproductive/Obstetrics                             Anesthesia Physical Anesthesia Plan  ASA: 4  Anesthesia Plan: General   Post-op Pain Management:    Induction: Intravenous  PONV Risk Score and Plan: 3 and Ondansetron, Dexamethasone and Midazolam  Airway Management Planned: Oral ETT  Additional Equipment:   Intra-op Plan:   Post-operative Plan: Extubation in OR  Informed Consent: I have reviewed the patients History and Physical, chart, labs and discussed the procedure including the risks, benefits and alternatives for the proposed anesthesia with the patient or authorized representative who has indicated his/her understanding and acceptance.     Dental advisory given  Plan Discussed with: CRNA  Anesthesia Plan Comments:         Anesthesia Quick Evaluation

## 2020-11-04 NOTE — Transfer of Care (Signed)
Immediate Anesthesia Transfer of Care Note  Patient: Donna Watkins  Procedure(s) Performed: ROBOTIC  VIDEO BRONCHOSCOPY WITH ENDOBRONCHIAL NAVIGATION BRONCHIAL BRUSHINGS BRONCHIAL BIOPSIES BRONCHIAL NEEDLE ASPIRATION BIOPSIES FIDUCIAL MARKER PLACEMENT  Patient Location: PACU  Anesthesia Type:General  Level of Consciousness: awake, alert  and oriented  Airway & Oxygen Therapy: Patient Spontanous Breathing and Patient connected to face mask oxygen  Post-op Assessment: Report given to RN and Post -op Vital signs reviewed and stable  Post vital signs: Reviewed and stable  Last Vitals:  Vitals Value Taken Time  BP 103/61 11/04/20 1111  Temp    Pulse 74 11/04/20 1112  Resp 20 11/04/20 1112  SpO2 99 % 11/04/20 1112  Vitals shown include unvalidated device data.  Last Pain:  Vitals:   11/04/20 0737  TempSrc:   PainSc: 0-No pain      Patients Stated Pain Goal: 3 (26/71/24 5809)  Complications: No notable events documented.

## 2020-11-04 NOTE — Op Note (Signed)
Video Bronchoscopy with Robotic Assisted Bronchoscopic Navigation   Date of Operation: 11/04/2020   Pre-op Diagnosis: Left upper lobe pulmonary nodule  Post-op Diagnosis: Same  Surgeon: Baltazar Apo  Assistants: None  Anesthesia: General endotracheal anesthesia  Operation: Flexible video fiberoptic bronchoscopy with robotic assistance and biopsies.  Estimated Blood Loss: Minimal  Complications: None  Indications and History: Donna Watkins is a 60 y.o. female with history of tobacco use and COPD.  Followed for an incidentally observed left upper lobe pulmonary nodule when she was having an MRI of the thoracic spine.  Recommendation made to achieve tissue diagnosis via navigational bronchoscopy with biopsy. The risks, benefits, complications, treatment options and expected outcomes were discussed with the patient.  The possibilities of pneumothorax, pneumonia, reaction to medication, pulmonary aspiration, perforation of a viscus, bleeding, failure to diagnose a condition and creating a complication requiring transfusion or operation were discussed with the patient who freely signed the consent.    Description of Procedure: The patient was seen in the Preoperative Area, was examined and was deemed appropriate to proceed.  The patient was taken to [facility] endoscopy room 3, identified as Donna Watkins and the procedure verified as Flexible Video Fiberoptic Bronchoscopy.  A Time Out was held and the above information confirmed.   Prior to the date of the procedure a high-resolution CT scan of the chest was performed. Utilizing ION software program a virtual tracheobronchial tree was generated to allow the creation of distinct navigation pathways to the patient's parenchymal abnormalities. After being taken to the operating room general anesthesia was initiated and the patient  was orally intubated. The video fiberoptic bronchoscope was introduced via the endotracheal tube and a general  inspection was performed which showed normal right and left lung anatomy, aspiration of the bilateral mainstems was completed to remove any remaining secretions. Robotic catheter inserted into patient's endotracheal tube.   Target #1 left upper lobe pulmonary nodule: The distinct navigation pathways prepared prior to this procedure were then utilized to navigate to patient's lesion identified on CT scan. The robotic catheter was secured into place and the vision probe was withdrawn.  Lesion location was approximated using fluoroscopy and radial endobronchial ultrasound for peripheral targeting.  Local registration and targeting was then performed with Cios three-dimensional imaging.  Under fluoroscopic guidance transbronchial needle brushings, transbronchial needle biopsies, and transbronchial forceps biopsies were performed to be sent for cytology and pathology.  A single fiducial marker was placed adjacent to the nodule under fluoroscopic guidance.  At the end of the procedure a general airway inspection was performed and there was no evidence of active bleeding. The bronchoscope was removed.  The patient tolerated the procedure well. There was no significant blood loss and there were no obvious complications. A post-procedural chest x-ray is pending.  Samples Target #1: 1. Transbronchial needle brushings from left upper lobe nodule 2. Transbronchial Wang needle biopsies from left upper lobe nodule 3. Transbronchial forceps biopsies from left upper lobe nodule  Plans:  The patient will be discharged from the PACU to home when recovered from anesthesia and after chest x-ray is reviewed. We will review the cytology, pathology and microbiology results with the patient when they become available. Outpatient followup will be with Dr. Lamonte Sakai.   Baltazar Apo, MD, PhD 11/04/2020, 11:02 AM [facility] [phone] or if no answer before 7:00PM call [phone] For any issues  after 7:00PM please call eLink [phone]

## 2020-11-04 NOTE — Assessment & Plan Note (Signed)
Currently undertreated.  Continue her albuterol for now but she needs to be on scheduled bronchodilator therapy.  We will work on this going forward.

## 2020-11-06 ENCOUNTER — Telehealth: Payer: Self-pay | Admitting: Emergency Medicine

## 2020-11-06 ENCOUNTER — Encounter (HOSPITAL_COMMUNITY): Payer: Self-pay | Admitting: Emergency Medicine

## 2020-11-06 LAB — CYTOLOGY - NON PAP

## 2020-11-06 NOTE — Telephone Encounter (Signed)
Reviewed pathology results with the patient by phone.  Left lung nodule biopsies consistent with squamous cell lung cancer.  She has a follow-up visit with oncology at [place] 11/12/2020.  Encouraged her to keep this visit.

## 2020-11-11 NOTE — Progress Notes (Signed)
Donna Watkins, Donna Watkins [id number]   CLINIC:  Medical Oncology/Hematology  PCP:  Lemmie Evens, MD [address] / [place] [address] [phone]   REASON FOR VISIT:  Follow-up for left upper lobe lung nodule  PRIOR THERAPY: none  NGS Results: not done  CURRENT THERAPY: surveillance  BRIEF ONCOLOGIC HISTORY:  Oncology History   No history exists.    CANCER STAGING: Cancer Staging No matching staging information was found for the patient.  INTERVAL HISTORY:  [name], a 60 y.o. female, returns for routine follow-up of her left upper lobe lung nodule. Kortny was last seen on 09/24/2020.   Today she reports feeling good. She has been on O2 since 2013.   REVIEW OF SYSTEMS:  Review of Systems  Constitutional:  Negative for appetite change and fatigue (50%).  Respiratory:  Positive for cough and shortness of breath.   Musculoskeletal:  Positive for back pain (3/10 mid back).  Psychiatric/Behavioral:  The patient is nervous/anxious.   All other systems reviewed and are negative.  PAST MEDICAL/SURGICAL HISTORY:  Past Medical History:  Diagnosis Date   Anxiety    panic attacks   COPD (chronic obstructive pulmonary disease) ([place]) 03/01/2012   Depression    GERD (gastroesophageal reflux disease)    Hyperlipidemia    Oxygen dependent    Past Surgical History:  Procedure Laterality Date   BRONCHIAL BIOPSY  11/04/2020   Procedure: BRONCHIAL BIOPSIES;  Surgeon: Collene Gobble, MD;  Location: [facility] ENDOSCOPY;  Service: Pulmonary;;   BRONCHIAL BRUSHINGS  11/04/2020   Procedure: BRONCHIAL BRUSHINGS;  Surgeon: Collene Gobble, MD;  Location: [facility];  Service: Pulmonary;;   BRONCHIAL NEEDLE ASPIRATION BIOPSY  11/04/2020   Procedure: BRONCHIAL NEEDLE ASPIRATION BIOPSIES;  Surgeon: Collene Gobble, MD;  Location: [facility] ENDOSCOPY;  Service: Pulmonary;;   COLONOSCOPY N/A 09/09/2012   Procedure: COLONOSCOPY;  Surgeon: Danie Binder, MD;  Location: [facility];  Service: Endoscopy;  Laterality: N/A;  1:15-moved to 12:45 Melanie notified pt   FIDUCIAL MARKER PLACEMENT  11/04/2020   Procedure: FIDUCIAL MARKER PLACEMENT;  Surgeon: Collene Gobble, MD;  Location[facility] ENDOSCOPY;  Service: Pulmonary;;   TUBAL LIGATION     VIDEO BRONCHOSCOPY WITH ENDOBRONCHIAL NAVIGATION N/A 11/04/2020   Procedure: ROBOTIC  VIDEO BRONCHOSCOPY WITH ENDOBRONCHIAL NAVIGATION;  Surgeon: Collene Gobble, MD;  Location: [place] ENDOSCOPY;  Service: Pulmonary;  Laterality: N/A;    SOCIAL HISTORY:  Social History   Socioeconomic History   Marital status: Divorced    Spouse name: Not on file   Number of children: Not on file   Years of education: Not on file   Highest education level: Not on file  Occupational History   Occupation: Disabled  Tobacco Use   Smoking status: Former    Packs/day: 2.50    Years: 30.00    Pack years: 75.00    Types: Cigarettes    Quit date: 06/12/2011    Years since quitting: 9.4   Smokeless tobacco: Never  Vaping Use   Vaping Use: Never used  Substance and Sexual Activity   Alcohol use: No   Drug use: No   Sexual activity: Not on file  Other Topics Concern   Not on file  Social History Narrative   Disability.    Lives with sister   Social Determinants of Health   Financial Resource Strain: Low Risk    Difficulty of Paying Living Expenses: Not very hard  Food Insecurity: No Food Insecurity   Worried About Charity fundraiser in the Last Year: Never true   Ran Out of Food in the Last Year: Never true  Transportation Needs: No Transportation Needs   Lack of Transportation (Medical): No   Lack of Transportation (Non-Medical): No  Physical Activity: Inactive   Days of Exercise per Week: 0 days   Minutes of Exercise per Session: 0 min  Stress: No Stress Concern Present   Feeling of Stress : Not at all  Social Connections: Socially Isolated   Frequency of Communication with Friends and Family: More  than three times a week   Frequency of Social Gatherings with Friends and Family: More than three times a week   Attends Religious Services: Never   Marine scientist or Organizations: No   Attends Music therapist: Never   Marital Status: Divorced  Human resources officer Violence: Not At Risk   Fear of Current or Ex-Partner: No   Emotionally Abused: No   Physically Abused: No   Sexually Abused: No    FAMILY HISTORY:  Family History  Problem Relation Age of Onset   COPD Mother    Heart disease Father    Colon cancer Neg Hx     CURRENT MEDICATIONS:  Current Outpatient Medications  Medication Sig Dispense Refill   albuterol (PROVENTIL) (2.5 MG/3ML) 0.083% nebulizer solution Take 3 mLs (2.5 mg total) by nebulization in the morning, at noon, in the evening, and at bedtime. Mixes with Ipratropium solution     albuterol (VENTOLIN HFA) 108 (90 Base) MCG/ACT inhaler Inhale 2 puffs into the lungs every 6 (six) hours as needed for wheezing or shortness of breath.     alprazolam (XANAX) 2 MG tablet Take 1 mg by mouth See admin instructions. 1mg  four to five times daily     atorvastatin (LIPITOR) 20 MG tablet Take 20 mg by mouth daily.     ibuprofen (ADVIL) 200 MG tablet Take 200 mg by mouth every 6 (six) hours as needed for mild pain.     ipratropium (ATROVENT) 0.02 % nebulizer solution Take 0.5 mg by nebulization 4 (four) times daily. Mixes with Albuterol solution     Multiple Vitamins-Minerals (CENTRUM SILVER PO) Take 1 tablet by mouth daily.     nystatin cream (MYCOSTATIN) Apply 1 application topically 2 (two) times daily as needed (rash).     omeprazole (PRILOSEC) 20 MG capsule Take 20 mg by mouth daily.     OXYGEN Place 2 L into the nose continuous.     traZODone (DESYREL) 50 MG tablet Take 50 mg by mouth at bedtime.     No current facility-administered medications for this visit.    ALLERGIES:  No Known Allergies  PHYSICAL EXAM:  Performance status (ECOG): 1 -  Symptomatic but completely ambulatory  There were no vitals filed for this visit. Wt Readings from Last 3 Encounters:  11/04/20 118 lb (53.5 kg)  10/16/20 118 lb 3.2 oz (53.6 kg)  09/24/20 116 lb 9.6 oz (52.9 kg)   Physical Exam Vitals reviewed.  Constitutional:      Appearance: Normal appearance.     Interventions: Nasal cannula in place.  Cardiovascular:     Rate and Rhythm: Normal rate and regular rhythm.     Pulses: Normal pulses.     Heart sounds: Normal heart sounds.  Pulmonary:     Effort: Pulmonary effort is normal.     Breath sounds: Normal breath sounds.  Neurological:  General: No focal deficit present.     Mental Status: She is alert and oriented to person, place, and time.  Psychiatric:        Mood and Affect: Mood normal.        Behavior: Behavior normal.     LABORATORY DATA:  I have reviewed the labs as listed.  CBC Latest Ref Rng & Units 11/04/2020 08-04-16 06/27/2012  WBC 4.0 - 10.5 K/uL 5.6 5.6 -  Hemoglobin 12.0 - 15.0 g/dL 12.3 13.4 12.9  Hematocrit 36.0 - 46.0 % 39.3 41.8 40  Platelets 150 - 400 K/uL 180 222.0 -   CMP Latest Ref Rng & Units 2016-08-04 06/30/2011 06/15/2011  Glucose 70 - 99 mg/dL 100(H) 109(H) 115(H)  BUN 6 - 23 mg/dL 8 13 8   Creatinine 0.40 - 1.20 mg/dL 0.77 0.5 0.48(L)  Sodium 135 - 145 mEq/L 140 143 142  Potassium 3.5 - 5.1 mEq/L 3.2(L) 4.4 3.8  Chloride 96 - 112 mEq/L 98 99 99  CO2 19 - 32 mEq/L 38(H) 38(H) 38(H)  Calcium 8.4 - 10.5 mg/dL 9.2 8.9 8.6  Total Protein 6.0 - 8.3 g/dL - - -  Total Bilirubin 0.3 - 1.2 mg/dL - - -  Alkaline Phos 39 - 117 U/L - - -  AST 0 - 37 U/L - - -  ALT 0 - 35 U/L - - -    DIAGNOSTIC IMAGING:  I have independently reviewed the scans and discussed with the patient. DG Chest Port 1 View  Result Date: 11/04/2020 CLINICAL DATA:  Status post bronchoscopic biopsy EXAM: PORTABLE CHEST 1 VIEW COMPARISON:  Previous studies including the CT done on 2020-08-04 FINDINGS: Cardiac size is within normal  limits. Low position of diaphragms suggests COPD. There is a small metallic density in the medial left upper lung fields, possibly postsurgical change. Irregular mass seen in the posteromedial left upper lobe in the CT examination is could not be distinctly visualized in the radiograph. There is no pleural effusion or pneumothorax. IMPRESSION: There is no pleural effusion or pneumothorax. Electronically Signed   By: Elmer Picker M.D.   On: 11/04/2020 11:24   CT Super D Chest Wo Contrast  Result Date: 11/04/2020 CLINICAL DATA:  Left upper lobe nodule, bronchoscopy scheduled EXAM: CT CHEST WITHOUT CONTRAST TECHNIQUE: Multidetector CT imaging of the chest was performed using thin slice collimation for electromagnetic bronchoscopy planning purposes, without intravenous contrast. COMPARISON:  08-04-20 FINDINGS: Cardiovascular: Scattered aortic atherosclerosis. Normal heart size. Left coronary artery calcification. No pericardial effusion. Mediastinum/Nodes: No enlarged mediastinal, hilar, or axillary lymph nodes. Thyroid gland, trachea, and esophagus demonstrate no significant findings. Lungs/Pleura: Severe centrilobular emphysema. Significant interval enlargement of a nodule of the posteromedial left upper lobe abutting the pleura, measuring 3.0 x 2.7 cm, previously 2.4 x 1.6 cm (series 7, image 55). No pleural effusion or pneumothorax. Upper Abdomen: No acute abnormality. Musculoskeletal: No chest wall mass or suspicious bone lesions identified. IMPRESSION: 1. Significant interval enlargement of a nodule of the posteromedial left upper lobe abutting the pleura, measuring 3.0 x 2.7 cm, previously 2.4 x 1.6 cm. Findings are consistent with enlarging primary lung malignancy. 2. Severe emphysema. 3. Coronary artery disease. Aortic Atherosclerosis (ICD10-I70.0) and Emphysema (ICD10-J43.9). Electronically Signed   By: Delanna Ahmadi M.D.   On: 11/04/2020 12:47   DG C-ARM BRONCHOSCOPY  Result Date:  11/04/2020 C-ARM BRONCHOSCOPY: Fluoroscopy was utilized by the requesting physician.  No radiographic interpretation.     ASSESSMENT:  1.  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 - No weight loss reported.  No chest pains. - MRI of the thoracic spine on 07/25/2020 showed incidental left upper lobe nodule measuring 2.3 x 1.5 x 1.7 cm. - CT of the chest with contrast on 07/31/2020 showed irregular nodule of the posteromedial left upper lobe abutting the pleura measuring 2.4 x 1.6 cm.  No pleural effusion or pneumothorax.  No adenopathy in the chest. - Denies any recent infections or COVID. - She has been on oxygen 24/7 for the last 10 years secondary to COPD.  2.  Social/family history: - She lives with her sister at home and is independent of ADLs and IADLs. - She has been on disability. - She quit smoking 10 years ago.  Smoked 2 to 3 pack/day for 30 years. - No family history of malignancies.   PLAN:  1.  Stage I left upper lobe squamous cell lung cancer: - We reviewed CT super D chest from 11/01/2020 which showed 3 x 2.7 cm left upper lobe nodule abutting the pleura, previously 2.4 x 1.6 cm.  No other suspicious lesions. - We reviewed pathology from bronchoscopy from 11/04/2020 which showed left upper lobe needle biopsy and brushing consistent with squamous cell carcinoma. - Based on her respiratory status, she is not a candidate for surgical resection.  She has been on oxygen since 2013, 24/7. - I have recommended SBRT of the lung lesion.  We will make a referral to radiation oncology. - She would like to have scans done locally.  I will arrange for a follow-up CT scan of the chest 3 months after SBRT.   Orders placed this encounter:  No orders of the defined types were placed in this encounter.    Derek Jack, MD [place] [phone]   I, Thana Ates, am acting as a  scribe for Dr. Derek Jack.  I, Derek Jack MD, have reviewed the above documentation for accuracy and completeness, and I agree with the above.

## 2020-11-12 ENCOUNTER — Other Ambulatory Visit: Payer: Self-pay

## 2020-11-12 ENCOUNTER — Inpatient Hospital Stay (HOSPITAL_COMMUNITY): Payer: Medicare PPO | Attending: Hematology | Admitting: Hematology

## 2020-11-12 VITALS — BP 108/75 | HR 72 | Temp 98.8°F | Resp 18 | Wt 118.0 lb

## 2020-11-12 DIAGNOSIS — R911 Solitary pulmonary nodule: Secondary | ICD-10-CM | POA: Diagnosis not present

## 2020-11-12 DIAGNOSIS — Z79899 Other long term (current) drug therapy: Secondary | ICD-10-CM | POA: Insufficient documentation

## 2020-11-12 DIAGNOSIS — C3490 Malignant neoplasm of unspecified part of unspecified bronchus or lung: Secondary | ICD-10-CM | POA: Diagnosis not present

## 2020-11-12 DIAGNOSIS — Z87891 Personal history of nicotine dependence: Secondary | ICD-10-CM | POA: Insufficient documentation

## 2020-11-12 DIAGNOSIS — F32A Depression, unspecified: Secondary | ICD-10-CM | POA: Diagnosis not present

## 2020-11-12 DIAGNOSIS — Z9981 Dependence on supplemental oxygen: Secondary | ICD-10-CM | POA: Insufficient documentation

## 2020-11-12 DIAGNOSIS — C3492 Malignant neoplasm of unspecified part of left bronchus or lung: Secondary | ICD-10-CM | POA: Diagnosis not present

## 2020-11-12 DIAGNOSIS — C3412 Malignant neoplasm of upper lobe, left bronchus or lung: Secondary | ICD-10-CM | POA: Diagnosis not present

## 2020-11-12 NOTE — Patient Instructions (Signed)
[place] at [facility] Discharge Instructions  You were seen and examined today by Dr. Delton Coombes. He discussed your most recent scan and biopsy results and it shows squamous cell lung cancer. He is referring you to [facility] for Radiation. Please follow up as scheduled.   Thank you for choosing [place] at [facility] to provide your oncology and hematology care.  To afford each patient quality time with our provider, please arrive at least 15 minutes before your scheduled appointment time.   If you have a lab appointment with the [facility] please come in thru the Main Entrance and check in at the main information desk.  You need to re-schedule your appointment should you arrive 10 or more minutes late.  We strive to give you quality time with our providers, and arriving late affects you and other patients whose appointments are after yours.  Also, if you no show three or more times for appointments you may be dismissed from the clinic at the providers discretion.     Again, thank you for choosing [facility].  Our hope is that these requests will decrease the amount of time that you wait before being seen by our physicians.       _____________________________________________________________  Should you have questions after your visit to [facility], please contact our office at [phone] and follow the prompts.  Our office hours are 8:00 a.m. and 4:30 p.m. Monday - Friday.  Please note that voicemails left after 4:00 p.m. may not be returned until the following business day.  We are closed weekends and major holidays.  You do have access to a nurse 24-7, just call the main number to the clinic [phone] and do not press any options, hold on the line and a nurse will answer the phone.    For prescription refill requests, have your pharmacy contact our office and allow 72 hours.    Due to Covid, you will need  to wear a mask upon entering the hospital. If you do not have a mask, a mask will be given to you at the Main Entrance upon arrival. For doctor visits, patients may have 1 support person age 48 or older with them. For treatment visits, patients can not have anyone with them due to social distancing guidelines and our immunocompromised population.

## 2020-11-14 ENCOUNTER — Other Ambulatory Visit: Payer: Self-pay | Admitting: *Deleted

## 2020-11-14 ENCOUNTER — Telehealth: Payer: Self-pay | Admitting: Radiation Oncology

## 2020-11-14 NOTE — Progress Notes (Signed)
The proposed treatment discussed in conference is for discussion purpose only and is not a binding recommendation. The patient was not been physically examined, or presented with their treatment options. Therefore, final treatment plans cannot be decided.  

## 2020-11-14 NOTE — Progress Notes (Signed)
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  Past/Anticipated interventions by surgeon, if any:  Based on her respiratory status, she is not a candidate for surgical resection.  She has been on oxygen since 2013, 24/7.  Past/Anticipated interventions by medical oncology, if any: Dr Delton Coombes I have recommended SBRT of the lung lesion.  We will make a referral to radiation oncology. - She would like to have scans done locally.  I will arrange for a follow-up CT scan of the chest 3 months after SBRT.      Pain issues, if any:  no   SAFETY ISSUES: Prior radiation? no Pacemaker/ICD? no Possible current pregnancy? no, postmenopausal Is the patient on methotrexate? no  Current Complaints / other details:  O2 2 L PNC, Shortness of breath with activity     Vitals:   11/20/20 1337  BP: 102/69  Pulse: 80  Resp: 18  Temp: 97.6 F (36.4 C)  SpO2: 99%  Weight: 118 lb 6.4 oz (53.7 kg)  Height: 5\' 3"  (1.6 m)  PF: (!) 2 L/min

## 2020-11-19 NOTE — Progress Notes (Signed)
Radiation Oncology         [phone] ________________________________  Initial Outpatient Consultation  Name: Donna Watkins MRN: [id number]  Date: 11/20/2020  DOB: [birth year]  KV:[id number], [name], MD  Derek Jack, MD   REFERRING PHYSICIAN: Derek Jack, MD  DIAGNOSIS: The encounter diagnosis was Squamous carcinoma of lung, left ([place]).  Squamous cell carcinoma of the LUL, Clinical Stage IA-3, (T1c, N0, M0)  HISTORY OF PRESENT ILLNESS::Donna Watkins is a 60 y.o. female who is accompanied by no one. she is seen as a courtesy of Dr. Delton Coombes for an opinion concerning radiation therapy as part of management for her recently diagnosed left lung cancer. The patient underwent MRI of the thoracic spine on 07/25/20 due to 6 months of left mid-back back pain, MRI incidentally showed a left upper lobe nodule measuring 2.3 x 1.5 x 1.7 cm. Chest CT with contrast on 07/31/20 further demonstrated the irregular nodule of the posteromedial left upper lobe to abut the pleura, measuring 2.4 x 1.6 cm.  Accordingly, the patient was referred to Dr. Delton Coombes on 08/12/20 by her pulmonologist for further evaluation. During this visit, the patient reported a cough productive of clear sputum for the past 2 weeks. Patient was noted to have a history of COPD and on supplemental O2 via nasal cannula for the past 10 years, 24/7. Given the patient's long smoking history, findings were noted as highly suggestive of malignancy. Subsequently, Dr. Delton Coombes recommended further work-up consisting of PET scan, MRI of the brain, and LUL biopsies.   MRI of the brain on 08/23/20 demonstrated no evidence of intracranial metastatic disease.  PET on 09/19/20 revealed the irregular 2.4 cm left upper lobe pulmonary nodule as hypermetabolic, noted as most consistent with primary bronchogenic neoplasm. No evidence of metastatic disease in the neck, chest, abdomen or pelvis were visualized.  The patient was  then referred to Dr. Lamonte Sakai on 10/16/20 for consideration of bronchoscopy with biopsies. Patient was noted to have a moderate risk for bronchoscopy due to her emphysema and hypoxemia.  Following discussion of the risks, the patient agreed to proceed with bronchoscopy on 11/04/20.   Chest CT without contrast on 11/01/20 demonstrated significant interval enlargement of the nodule of the posteromedial left upper lobe abutting the pleura, measuring 3.0 x 2.7 cm, previously 2.4 x 1.6 cm. Findings were noted as consistent with enlarging primary lung malignancy.   Biopsies of the LUL on 11/04/20 revealed malignant cells consistent with squamous cell carcinoma.  The patient most recently followed up with Dr. Delton Coombes on 11/12/20. The patient was noted to be doing well from a post-procedural standpoint, though reported cough, SOB, and back pain rated at a 3/10. Per Dr. Delton Coombes, the patient is not a good candidate for surgical resectioning given her respiratory status. Subsequently, Dr. Delton Coombes recommended SBRT of the lung lesion and referred the patient to me here today.    PREVIOUS RADIATION THERAPY: No  PAST MEDICAL HISTORY:  Past Medical History:  Diagnosis Date   Anxiety    panic attacks   COPD (chronic obstructive pulmonary disease) ([place]) 03/01/2012   Depression    GERD (gastroesophageal reflux disease)    Hyperlipidemia    Oxygen dependent     PAST SURGICAL HISTORY: Past Surgical History:  Procedure Laterality Date   BRONCHIAL BIOPSY  11/04/2020   Procedure: BRONCHIAL BIOPSIES;  Surgeon: Collene Gobble, MD;  Location: [facility] ENDOSCOPY;  Service: Pulmonary;;   BRONCHIAL BRUSHINGS  11/04/2020   Procedure: BRONCHIAL BRUSHINGS;  Surgeon: Collene Gobble,  MD;  Location: [facility];  Service: Pulmonary;;   BRONCHIAL NEEDLE ASPIRATION BIOPSY  11/04/2020   Procedure: BRONCHIAL NEEDLE ASPIRATION BIOPSIES;  Surgeon: Collene Gobble, MD;  Location: [facility] ENDOSCOPY;  Service: Pulmonary;;    COLONOSCOPY N/A 09/09/2012   Procedure: COLONOSCOPY;  Surgeon: Danie Binder, MD;  Location: [facility] ENDO SUITE;  Service: Endoscopy;  Laterality: N/A;  1:15-moved to 12:45 Melanie notified pt   FIDUCIAL MARKER PLACEMENT  11/04/2020   Procedure: FIDUCIAL MARKER PLACEMENT;  Surgeon: Collene Gobble, MD;  Location: [facility] ENDOSCOPY;  Service: Pulmonary;;   TUBAL LIGATION     VIDEO BRONCHOSCOPY WITH ENDOBRONCHIAL NAVIGATION N/A 11/04/2020   Procedure: ROBOTIC  VIDEO BRONCHOSCOPY WITH ENDOBRONCHIAL NAVIGATION;  Surgeon: Collene Gobble, MD;  Location: [facility] ENDOSCOPY;  Service: Pulmonary;  Laterality: N/A;    FAMILY HISTORY:  Family History  Problem Relation Age of Onset   COPD Mother    Heart disease Father    Colon cancer Neg Hx     SOCIAL HISTORY:  Social History   Tobacco Use   Smoking status: Former    Packs/day: 2.50    Years: 30.00    Pack years: 75.00    Types: Cigarettes    Quit date: 06/12/2011    Years since quitting: 9.4   Smokeless tobacco: Never  Vaping Use   Vaping Use: Never used  Substance Use Topics   Alcohol use: No   Drug use: No    ALLERGIES: No Known Allergies  MEDICATIONS:  Current Outpatient Medications  Medication Sig Dispense Refill   albuterol (PROVENTIL) (2.5 MG/3ML) 0.083% nebulizer solution Take 3 mLs (2.5 mg total) by nebulization in the morning, at noon, in the evening, and at bedtime. Mixes with Ipratropium solution     albuterol (VENTOLIN HFA) 108 (90 Base) MCG/ACT inhaler Inhale 2 puffs into the lungs every 6 (six) hours as needed for wheezing or shortness of breath.     ALPRAZolam (XANAX) 1 MG tablet Take 1 mg by mouth 5 (five) times daily as needed.     atorvastatin (LIPITOR) 20 MG tablet Take 20 mg by mouth daily.     ibuprofen (ADVIL) 200 MG tablet Take 200 mg by mouth every 6 (six) hours as needed for mild pain.     ipratropium (ATROVENT) 0.02 % nebulizer solution Take 0.5 mg by nebulization 4 (four) times daily. Mixes with Albuterol solution      Multiple Vitamins-Minerals (CENTRUM SILVER PO) Take 1 tablet by mouth daily.     nystatin cream (MYCOSTATIN) Apply 1 application topically 2 (two) times daily as needed (rash).     omeprazole (PRILOSEC) 20 MG capsule Take 20 mg by mouth daily.     OXYGEN Place 2 L into the nose continuous.     traZODone (DESYREL) 50 MG tablet Take 50 mg by mouth at bedtime.     No current facility-administered medications for this encounter.    REVIEW OF SYSTEMS:  A 10+ POINT REVIEW OF SYSTEMS WAS OBTAINED including neurology, dermatology, psychiatry, cardiac, respiratory, lymph, extremities, GI, GU, musculoskeletal, constitutional, reproductive, HEENT.  She denies any pain within the chest area significant cough or hemoptysis.  She is oxygen dependent at 2 to 3 L 24 hours a day.  She does have some back pain which prompted her initial work-up uncovering her lung lesion.   PHYSICAL EXAM:  height is 5\' 3"  (1.6 m) and weight is 118 lb 6.4 oz (53.7 kg). Her temperature is 97.6 F (36.4 C). Her blood pressure is  102/69 and her pulse is 80. Her respiration is 18 and oxygen saturation is 99%.   General: Alert and oriented, in no acute distress, oxygen in place by nasal cannula at 2 L HEENT: Head is normocephalic. Extraocular movements are intact. Oropharynx is clear.  Several teeth missing. Neck: Neck is supple, no palpable cervical or supraclavicular lymphadenopathy. Heart: Regular in rate and rhythm with no murmurs, rubs, or gallops. Chest: Clear to auscultation bilaterally, with no rhonchi, wheezes, or rales.  Breath sounds in general distant. Abdomen: Soft, nontender, nondistended, with no rigidity or guarding. Extremities: No cyanosis or edema. Lymphatics: see Neck Exam Skin: No concerning lesions. Musculoskeletal: symmetric strength and muscle tone throughout. Neurologic: Cranial nerves II through XII are grossly intact. No obvious focalities. Speech is fluent. Coordination is intact. Psychiatric: Judgment  and insight are intact. Affect is appropriate.   ECOG = 1  0 - Asymptomatic (Fully active, able to carry on all predisease activities without restriction)  1 - Symptomatic but completely ambulatory (Restricted in physically strenuous activity but ambulatory and able to carry out work of a light or sedentary nature. For example, light housework, office work)  2 - Symptomatic, <50% in bed during the day (Ambulatory and capable of all self care but unable to carry out any work activities. Up and about more than 50% of waking hours)  3 - Symptomatic, >50% in bed, but not bedbound (Capable of only limited self-care, confined to bed or chair 50% or more of waking hours)  4 - Bedbound (Completely disabled. Cannot carry on any self-care. Totally confined to bed or chair)  5 - Death   Eustace Pen MM, Creech RH, Tormey DC, et al. [phone]). "Toxicity and response criteria of the [facility]". [place] Oncol. 5 (6): 649-55  LABORATORY DATA:  Lab Results  Component Value Date   WBC 5.6 11/04/2020   HGB 12.3 11/04/2020   HCT 39.3 11/04/2020   MCV 97.3 11/04/2020   PLT 180 11/04/2020   NEUTROABS 3.7 August 13, 2016   Lab Results  Component Value Date   NA 140 08/13/2016   K 3.2 (L) 08-13-2016   CL 98 2016/08/13   CO2 38 (H) 2016/08/13   GLUCOSE 100 (H) 08/13/16   CREATININE 0.77 Aug 13, 2016   CALCIUM 9.2 2016-08-13      RADIOGRAPHY: DG Chest Port 1 View  Result Date: 11/04/2020 CLINICAL DATA:  Status post bronchoscopic biopsy EXAM: PORTABLE CHEST 1 VIEW COMPARISON:  Previous studies including the CT done on 2020/08/13 FINDINGS: Cardiac size is within normal limits. Low position of diaphragms suggests COPD. There is a small metallic density in the medial left upper lung fields, possibly postsurgical change. Irregular mass seen in the posteromedial left upper lobe in the CT examination is could not be distinctly visualized in the radiograph. There is no pleural effusion or  pneumothorax. IMPRESSION: There is no pleural effusion or pneumothorax. Electronically Signed   By: Elmer Picker M.D.   On: 11/04/2020 11:24   CT Super D Chest Wo Contrast  Result Date: 11/04/2020 CLINICAL DATA:  Left upper lobe nodule, bronchoscopy scheduled EXAM: CT CHEST WITHOUT CONTRAST TECHNIQUE: Multidetector CT imaging of the chest was performed using thin slice collimation for electromagnetic bronchoscopy planning purposes, without intravenous contrast. COMPARISON:  08-13-2020 FINDINGS: Cardiovascular: Scattered aortic atherosclerosis. Normal heart size. Left coronary artery calcification. No pericardial effusion. Mediastinum/Nodes: No enlarged mediastinal, hilar, or axillary lymph nodes. Thyroid gland, trachea, and esophagus demonstrate no significant findings. Lungs/Pleura: Severe centrilobular emphysema. Significant interval enlargement  of a nodule of the posteromedial left upper lobe abutting the pleura, measuring 3.0 x 2.7 cm, previously 2.4 x 1.6 cm (series 7, image 55). No pleural effusion or pneumothorax. Upper Abdomen: No acute abnormality. Musculoskeletal: No chest wall mass or suspicious bone lesions identified. IMPRESSION: 1. Significant interval enlargement of a nodule of the posteromedial left upper lobe abutting the pleura, measuring 3.0 x 2.7 cm, previously 2.4 x 1.6 cm. Findings are consistent with enlarging primary lung malignancy. 2. Severe emphysema. 3. Coronary artery disease. Aortic Atherosclerosis (ICD10-I70.0) and Emphysema (ICD10-J43.9). Electronically Signed   By: Delanna Ahmadi M.D.   On: 11/04/2020 12:47   DG C-ARM BRONCHOSCOPY  Result Date: 11/04/2020 C-ARM BRONCHOSCOPY: Fluoroscopy was utilized by the requesting physician.  No radiographic interpretation.      IMPRESSION: Squamous cell carcinoma of the LUL, Clinical Stage IA-3, (T1c, N0, M0)  The patient would be a good candidate for stereotactic body radiation therapy directed at her solitary lesion in the  left upper lobe. Her lesion is closely approaching the aorta and she may not be a candidate for SBRT, if not then she can be treated with hypofractionated radiation therapy over 10 fractions using SBRT set up techniques.    She is not a candidate for surgery given her significant emphysema and hypoxemia.  Today, I talked to the patient  about the findings and work-up thus far.  We discussed the natural history of non-small cell lung cancer and general treatment, highlighting the role of radiotherapy in the management.  We discussed the available radiation techniques, and focused on the details of logistics and delivery.  We reviewed the anticipated acute and late sequelae associated with radiation in this setting.  The patient was encouraged to ask questions that I answered to the best of my ability.  A patient consent form was discussed and signed.  We retained a copy for our records.  The patient would like to proceed with radiation and will be scheduled for CT simulation.  PLAN: The patient will return for SBRT simulation next week with treatments to begin after Thanksgiving.  Anticipate 3-5 SBRT T treatments.   60 minutes of total time was spent for this patient encounter, including preparation, face-to-face counseling with the patient and coordination of care, physical exam, and documentation of the encounter.   ------------------------------------------------  Blair Promise, PhD, MD  This document serves as a record of services personally performed by Gery Pray, MD. It was created on his behalf by Roney Mans, a trained medical scribe. The creation of this record is based on the scribe's personal observations and the provider's statements to them. This document has been checked and approved by the attending provider.

## 2020-11-20 ENCOUNTER — Encounter: Payer: Self-pay | Admitting: Radiation Oncology

## 2020-11-20 ENCOUNTER — Ambulatory Visit
Admission: RE | Admit: 2020-11-20 | Discharge: 2020-11-20 | Disposition: A | Discharge status: Home / Self Care | Payer: Medicare PPO | Source: Ambulatory Visit | Attending: Radiation Oncology | Admitting: Radiation Oncology

## 2020-11-20 ENCOUNTER — Other Ambulatory Visit: Payer: Self-pay

## 2020-11-20 VITALS — BP 102/69 | HR 80 | Temp 97.6°F | Resp 18 | Ht 63.0 in | Wt 118.4 lb

## 2020-11-20 DIAGNOSIS — F17210 Nicotine dependence, cigarettes, uncomplicated: Secondary | ICD-10-CM | POA: Insufficient documentation

## 2020-11-20 DIAGNOSIS — Z9981 Dependence on supplemental oxygen: Secondary | ICD-10-CM | POA: Insufficient documentation

## 2020-11-20 DIAGNOSIS — C3492 Malignant neoplasm of unspecified part of left bronchus or lung: Secondary | ICD-10-CM

## 2020-11-20 DIAGNOSIS — I2510 Atherosclerotic heart disease of native coronary artery without angina pectoris: Secondary | ICD-10-CM | POA: Insufficient documentation

## 2020-11-20 DIAGNOSIS — E785 Hyperlipidemia, unspecified: Secondary | ICD-10-CM | POA: Diagnosis not present

## 2020-11-20 DIAGNOSIS — K219 Gastro-esophageal reflux disease without esophagitis: Secondary | ICD-10-CM | POA: Insufficient documentation

## 2020-11-20 DIAGNOSIS — Z79899 Other long term (current) drug therapy: Secondary | ICD-10-CM | POA: Diagnosis not present

## 2020-11-20 DIAGNOSIS — J432 Centrilobular emphysema: Secondary | ICD-10-CM | POA: Insufficient documentation

## 2020-11-20 DIAGNOSIS — C3412 Malignant neoplasm of upper lobe, left bronchus or lung: Secondary | ICD-10-CM | POA: Diagnosis present

## 2020-11-20 DIAGNOSIS — R0902 Hypoxemia: Secondary | ICD-10-CM | POA: Insufficient documentation

## 2020-11-20 NOTE — Progress Notes (Signed)
See MD note for nursing evaluation. °

## 2020-11-21 ENCOUNTER — Encounter: Payer: Self-pay | Admitting: General Practice

## 2020-11-21 NOTE — Progress Notes (Addendum)
[place] Psychosocial Distress Screening Clinical Social Work  Clinical Social Work was referred by distress screening protocol.  The patient scored a 7 on the Psychosocial Distress Thermometer which indicates moderate distress. Clinical Social Worker contacted patient by phone to assess for distress and other psychosocial needs. She lives w sister in private home.  Daughter has transported her to appointment in [place]. She will need help w getting to/from [facility] for radiation.    Referral made to [facility].  Will mail her information on [place] [facility] [place] - she can access these resources as needed/desired.   ONCBCN DISTRESS SCREENING 11/20/2020  Distress experienced in past week (1-10) 7  Emotional problem type Depression;Nervousness/Anxiety;Adjusting to illness  Physical Problem type Constipation/diarrhea  Referral to clinical social work Yes    Clinical Social Worker follow up needed: No.  If yes, follow up plan:    Beverely Pace, [place], LCSW Clinical Social Worker Phone:  [phone]

## 2020-11-25 ENCOUNTER — Ambulatory Visit
Admission: RE | Admit: 2020-11-25 | Discharge: 2020-11-25 | Disposition: A | Discharge status: Home / Self Care | Payer: Medicare PPO | Source: Ambulatory Visit | Attending: Radiation Oncology | Admitting: Radiation Oncology

## 2020-11-25 ENCOUNTER — Other Ambulatory Visit: Payer: Self-pay

## 2020-11-25 DIAGNOSIS — C3492 Malignant neoplasm of unspecified part of left bronchus or lung: Secondary | ICD-10-CM | POA: Diagnosis present

## 2020-11-25 DIAGNOSIS — Z87891 Personal history of nicotine dependence: Secondary | ICD-10-CM | POA: Insufficient documentation

## 2020-12-07 DIAGNOSIS — C3492 Malignant neoplasm of unspecified part of left bronchus or lung: Secondary | ICD-10-CM | POA: Insufficient documentation

## 2020-12-09 ENCOUNTER — Other Ambulatory Visit: Payer: Self-pay

## 2020-12-09 ENCOUNTER — Ambulatory Visit
Admission: RE | Admit: 2020-12-09 | Discharge: 2020-12-09 | Disposition: A | Discharge status: Home / Self Care | Payer: Medicare PPO | Source: Ambulatory Visit | Attending: Radiation Oncology | Admitting: Radiation Oncology

## 2020-12-09 DIAGNOSIS — C3492 Malignant neoplasm of unspecified part of left bronchus or lung: Secondary | ICD-10-CM | POA: Diagnosis not present

## 2020-12-09 DIAGNOSIS — C3412 Malignant neoplasm of upper lobe, left bronchus or lung: Secondary | ICD-10-CM | POA: Insufficient documentation

## 2020-12-10 ENCOUNTER — Ambulatory Visit: Payer: Medicare PPO | Admitting: Radiation Oncology

## 2020-12-11 ENCOUNTER — Other Ambulatory Visit: Payer: Self-pay

## 2020-12-11 ENCOUNTER — Ambulatory Visit
Admission: RE | Admit: 2020-12-11 | Discharge: 2020-12-11 | Disposition: A | Discharge status: Home / Self Care | Payer: Medicare PPO | Source: Ambulatory Visit | Attending: Radiation Oncology | Admitting: Radiation Oncology

## 2020-12-11 DIAGNOSIS — C3492 Malignant neoplasm of unspecified part of left bronchus or lung: Secondary | ICD-10-CM | POA: Diagnosis not present

## 2020-12-13 ENCOUNTER — Other Ambulatory Visit: Payer: Self-pay

## 2020-12-13 ENCOUNTER — Ambulatory Visit
Admission: RE | Admit: 2020-12-13 | Discharge: 2020-12-13 | Disposition: A | Discharge status: Home / Self Care | Payer: Medicare PPO | Source: Ambulatory Visit | Attending: Radiation Oncology | Admitting: Radiation Oncology

## 2020-12-13 DIAGNOSIS — C3492 Malignant neoplasm of unspecified part of left bronchus or lung: Secondary | ICD-10-CM | POA: Diagnosis not present

## 2020-12-16 ENCOUNTER — Ambulatory Visit: Payer: Medicare PPO | Admitting: Radiation Oncology

## 2020-12-17 ENCOUNTER — Other Ambulatory Visit: Payer: Self-pay

## 2020-12-17 ENCOUNTER — Ambulatory Visit
Admission: RE | Admit: 2020-12-17 | Discharge: 2020-12-17 | Disposition: A | Discharge status: Home / Self Care | Payer: Medicare PPO | Source: Ambulatory Visit | Attending: Radiation Oncology | Admitting: Radiation Oncology

## 2020-12-17 DIAGNOSIS — C3492 Malignant neoplasm of unspecified part of left bronchus or lung: Secondary | ICD-10-CM | POA: Diagnosis not present

## 2020-12-19 ENCOUNTER — Other Ambulatory Visit: Payer: Self-pay

## 2020-12-19 ENCOUNTER — Encounter: Payer: Self-pay | Admitting: Radiation Oncology

## 2020-12-19 ENCOUNTER — Ambulatory Visit
Admission: RE | Admit: 2020-12-19 | Discharge: 2020-12-19 | Disposition: A | Discharge status: Home / Self Care | Payer: Medicare PPO | Source: Ambulatory Visit | Attending: Radiation Oncology | Admitting: Radiation Oncology

## 2020-12-19 DIAGNOSIS — C3492 Malignant neoplasm of unspecified part of left bronchus or lung: Secondary | ICD-10-CM | POA: Diagnosis not present

## 2021-01-22 ENCOUNTER — Encounter: Payer: Self-pay | Admitting: Radiation Oncology

## 2021-01-26 NOTE — Progress Notes (Signed)
Radiation Oncology         [phone] ________________________________  Name: Donna Watkins MRN: [id number]  Date: 01/27/2021  DOB: 10/13/1960  Follow-Up Visit Note  CC: Lemmie Evens, MD  Derek Jack, MD    ICD-10-CM   1. Squamous carcinoma of lung, left ([facility])  C34.92       Diagnosis:  The encounter diagnosis was Squamous carcinoma of lung, left ([place]).   Squamous cell carcinoma of the LUL, Clinical Stage IA-3, (T1c, N0, M0)  Interval Since Last Radiation: 1 month and 8 days   Intent: Curative  Radiation Treatment Dates: 12/09/2020 through 12/19/2020 Site Technique Total Dose (Gy) Dose per Fx (Gy) Completed Fx Beam Energies  Lung, Left: Lung_Lt SBRT 50/50 10 5/5 6XFFF    Narrative:  The patient returns today for routine follow-up. The patient tolerated radiation therapy relatively well other than ongoing reports of fatigue and SOB on exertion. She denied any cough, hemoptysis, or skin irritation throughout her treatment, and reported a healthy appetite. She continues to use 2L supplemental O2 via nasal cannula daily.   Otherwise, no significant interval history since the patient was last seen for her initial consultation in November.   She reports no longer having any cough.  She denies any pain within the chest area but does report some pain related to her spine/back area.  She denies any significant changes in her breathing.  She denies any hemoptysis.  She continues on 2 L of oxygen                          Allergies:  has No Known Allergies.  Meds: Current Outpatient Medications  Medication Sig Dispense Refill   albuterol (PROVENTIL) (2.5 MG/3ML) 0.083% nebulizer solution Take 3 mLs (2.5 mg total) by nebulization in the morning, at noon, in the evening, and at bedtime. Mixes with Ipratropium solution     albuterol (VENTOLIN HFA) 108 (90 Base) MCG/ACT inhaler Inhale 2 puffs into the lungs every 6 (six) hours as needed for wheezing or shortness of breath.      ALPRAZolam (XANAX) 1 MG tablet Take 1 mg by mouth 5 (five) times daily as needed.     atorvastatin (LIPITOR) 20 MG tablet Take 20 mg by mouth daily.     ibuprofen (ADVIL) 200 MG tablet Take 200 mg by mouth every 6 (six) hours as needed for mild pain.     ipratropium (ATROVENT) 0.02 % nebulizer solution Take 0.5 mg by nebulization 4 (four) times daily. Mixes with Albuterol solution     Multiple Vitamins-Minerals (CENTRUM SILVER PO) Take 1 tablet by mouth daily.     nystatin cream (MYCOSTATIN) Apply 1 application topically 2 (two) times daily as needed (rash).     omeprazole (PRILOSEC) 20 MG capsule Take 20 mg by mouth daily.     OXYGEN Place 2 L into the nose continuous.     traZODone (DESYREL) 50 MG tablet Take 50 mg by mouth at bedtime.     No current facility-administered medications for this encounter.    Physical Findings: The patient is in no acute distress. Patient is alert and oriented.  She is on 2 L of oxygen by nasal cannula.  height is 5\' 3"  (1.6 m) and weight is 114 lb 6 oz (51.9 kg). Her temporal temperature is 96.6 F (35.9 C) (abnormal). Her blood pressure is 114/61 and her pulse is 89. Her respiration is 18 and oxygen saturation is 95%. Marland Kitchen  Lungs are clear to auscultation bilaterally. Heart has regular rate and rhythm. No palpable cervical, supraclavicular, or axillary adenopathy. Abdomen soft, non-tender, normal bowel sounds.    Lab Findings: Lab Results  Component Value Date   WBC 5.6 11/04/2020   HGB 12.3 11/04/2020   HCT 39.3 11/04/2020   MCV 97.3 11/04/2020   PLT 180 11/04/2020    Radiographic Findings: No results found.  Impression:  The encounter diagnosis was Squamous carcinoma of lung, left ([place]).   Squamous cell carcinoma of the LUL, Clinical Stage IA-3, (T1c, N0, M0)   She overall tolerated her SBRT well.  She has recovered well and is at her baseline.  No evidence of recurrence on clinical exam.  Plan: As needed follow-up in radiation oncology.   She is scheduled for a chest CT scan ordered by Dr. Delton Coombes and then we will follow-up with him on March 15.  She will also be following up with Dr. Melvyn Novas her pulmonologist next month.  I have asked her to call me if she has any questions about her chest CT scan after report is available.  We discussed findings that would be expected after her SBRT in the treatment area.    ____________________________________  Blair Promise, PhD, MD  This document serves as a record of services personally performed by Gery Pray, MD. It was created on his behalf by Roney Mans, a trained medical scribe. The creation of this record is based on the scribe's personal observations and the provider's statements to them. This document has been checked and approved by the attending provider.

## 2021-01-26 NOTE — Progress Notes (Incomplete)
°  Radiation Oncology         (336) [phone] ________________________________  Patient Name: Donna Watkins MRN: [id number] DOB: [birth year] Referring Physician: Derek Jack Date of Service: 12/19/2020 [facility] [place]                                                        End Of Treatment Note  Diagnoses: C34.92-Malignant neoplasm of unspecified part of left bronchus or lung R91.1-Solitary pulmonary nodule  Cancer Staging: Squamous cell carcinoma of the LUL, Clinical Stage IA-3, (T1c, N0, M0)  Intent: Curative  Radiation Treatment Dates: 12/09/2020 through 12/19/2020 Site Technique Total Dose (Gy) Dose per Fx (Gy) Completed Fx Beam Energies  Lung, Left: Lung_Lt IMRT 50/50 10 5/5 6XFFF   Narrative: The patient tolerated radiation therapy relatively well. She reports fatigue and SOB on exertion. She is on 2L supplemental O2 via nasal cannula daily (which is in place during this visit). Otherwise, she denies any cough, hemoptysis, or skin irritation, and reports a healthy appetite.   Plan: The patient will follow-up with radiation oncology in one month .  ________________________________________________ -----------------------------------  Blair Promise, PhD, MD  This document serves as a record of services personally performed by Gery Pray, MD. It was created on his behalf by Roney Mans, a trained medical scribe. The creation of this record is based on the scribe's personal observations and the provider's statements to them. This document has been checked and approved by the attending provider.

## 2021-01-27 ENCOUNTER — Ambulatory Visit
Admission: RE | Admit: 2021-01-27 | Discharge: 2021-01-27 | Disposition: A | Discharge status: Home / Self Care | Payer: Medicare PPO | Source: Ambulatory Visit | Attending: Radiation Oncology | Admitting: Radiation Oncology

## 2021-01-27 ENCOUNTER — Other Ambulatory Visit: Payer: Self-pay

## 2021-01-27 ENCOUNTER — Encounter: Payer: Self-pay | Admitting: Radiation Oncology

## 2021-01-27 VITALS — BP 114/61 | HR 89 | Temp 96.6°F | Resp 18 | Ht 63.0 in | Wt 114.4 lb

## 2021-01-27 DIAGNOSIS — R0602 Shortness of breath: Secondary | ICD-10-CM | POA: Insufficient documentation

## 2021-01-27 DIAGNOSIS — R5383 Other fatigue: Secondary | ICD-10-CM | POA: Diagnosis not present

## 2021-01-27 DIAGNOSIS — Z79899 Other long term (current) drug therapy: Secondary | ICD-10-CM | POA: Diagnosis not present

## 2021-01-27 DIAGNOSIS — C3412 Malignant neoplasm of upper lobe, left bronchus or lung: Secondary | ICD-10-CM | POA: Insufficient documentation

## 2021-01-27 DIAGNOSIS — Z923 Personal history of irradiation: Secondary | ICD-10-CM | POA: Diagnosis not present

## 2021-01-27 DIAGNOSIS — C3492 Malignant neoplasm of unspecified part of left bronchus or lung: Secondary | ICD-10-CM

## 2021-01-27 HISTORY — DX: Personal history of irradiation: Z92.3

## 2021-01-27 NOTE — Progress Notes (Signed)
Donna Watkins is here today for follow up post radiation to the lung.  Lung Side: left  Completed radiation treatment on: 12/19/2020  Does the patient complain of any of the following: Pain:3-4/10 mid back pain Shortness of breath w/wo exertion: shortness of breath with activity Cough: has resolved since treatment Hemoptysis: denies Pain with swallowing: denies Swallowing/choking concerns: denies Appetite: good Energy Level: fair Post radiation skin Changes: denies    Additional comments if applicable: none  Vitals:   01/27/21 1605  BP: 114/61  Pulse: 89  Resp: 18  Temp: (!) 96.6 F (35.9 C)  TempSrc: Temporal  SpO2: 95%  Weight: 114 lb 6 oz (51.9 kg)  Height: 5\' 3"  (1.6 m)

## 2021-02-17 ENCOUNTER — Ambulatory Visit: Payer: Medicare PPO | Admitting: Internal Medicine

## 2021-02-19 ENCOUNTER — Encounter: Payer: Self-pay | Admitting: Internal Medicine

## 2021-02-19 ENCOUNTER — Ambulatory Visit (INDEPENDENT_AMBULATORY_CARE_PROVIDER_SITE_OTHER): Payer: Medicare PPO | Admitting: Internal Medicine

## 2021-02-19 ENCOUNTER — Other Ambulatory Visit: Payer: Self-pay

## 2021-02-19 DIAGNOSIS — J9611 Chronic respiratory failure with hypoxia: Secondary | ICD-10-CM

## 2021-02-19 DIAGNOSIS — R911 Solitary pulmonary nodule: Secondary | ICD-10-CM

## 2021-02-19 DIAGNOSIS — J449 Chronic obstructive pulmonary disease, unspecified: Secondary | ICD-10-CM

## 2021-02-19 DIAGNOSIS — R1319 Other dysphagia: Secondary | ICD-10-CM | POA: Diagnosis not present

## 2021-02-19 DIAGNOSIS — J9612 Chronic respiratory failure with hypercapnia: Secondary | ICD-10-CM

## 2021-02-19 NOTE — Patient Instructions (Addendum)
Make sure you check your oxygen saturation  AT  your highest level of activity (not after you stop)   to be sure it stays over 90% and adjust  02 flow upward to maintain this level if needed but remember to turn it back to previous settings when you stop (to conserve your supply).    Have Dr Karie Kirks refer you to a new GI doctor since Dr Oneida Alar has moved    Please schedule a follow up visit in 6  months but call sooner if needed

## 2021-02-19 NOTE — Progress Notes (Signed)
Subjective:    Patient ID: Donna Watkins, female    DOB: 17-Apr-1960,    MRN: [id number]    Brief patient profile:  11   yowm MM/quit smoking in 2013 with severe copd at baseline last seen in pulmonary clinic  by [facility] 11/2013  GOLD IV criteria with PFTs 05/2012 ith fev1 only 26%, diffusion at 52%    History of Present Illness  07/31/2016 1st Watkins visit/ Donna Watkins   GOLD IV copd/ 02 2lpm 24/7  Chief Complaint  Patient presents with   Pulmonary Consult    Pt is self referred for COPD. Pt c/o DOE, throat clearing. Pt denies CP/tightness anf recentl f/c/s. Pt states she rarely uses albuterol hfa.   doe -  turns up 3lpm walking but still doe x 50 ft Use scooter at [employer] x 5 years  Sleeps on 4lpm / no am ha / cough or congestion  Uses neb avg bid, very poor  insight into meds/02  Some better breathing p neb saba rec  Call us if you want to be referred to [facility] for pulmonary rehab - in meantime continue 2lpm at rest and 3lpm with activity    Please schedule a follow up visit in 3 months but call sooner if needed with full pfts on return      12/31/2016  f/u ov/Donna Watkins re:  2lpm 24/7  Chief Complaint  Patient presents with   Follow-up    review PFT results. States breathing has been ok since last visit.    breathing ok at rest but can't walk 300 ft even on up to 3lpm s sob on flat surface slow pace  rec Stay on alb/ipatropium up to 4 x daily    07/02/2017  f/u ov/Donna Watkins re: GOLD IV  / 02 24/7  Chief Complaint  Patient presents with   Follow-up    Breathing is overall doing well overall. She is using her albuterol inhaler once per wk on average. She uses her albuterol and atrovent 2 x daily on average. She states she has been losing wt unintentionally.    Dyspnea:  Not usually walking more than a few hundred feet on 3lpm / still using scooter at [employer]/ [employer] Cough: not now Sleeping: insomnia no resp complaints  SABA use: about twice dialy duoneb  02: 2lpm x  3lpm walking   rec Plan A = Automatic = Bevespi Take 2 puffs first thing in am and then another 2 puffs about 12 hours later.  Plan B = Backup Only use your albuterol as a rescue medication Plan C = Crisis - only use your albuterol (no ipatropium)  nebulizer if you first try Plan B and it fails to help > ok to use the nebulizer up to every 4 hours but if start needing it regularly call for immediate appointment        07/04/2018  f/u ov/Donna Watkins re:  GOLD IV  / 02 24/7  3lpm at home then 2lpm cont out/ duoneb only  Chief Complaint  Patient presents with   Follow-up    Breathing is about the same. No new co's. She rarely uses her albuterol inhaler but uses her neb 2 x daily.    Dyspnea:  Always rides scooter / room to room at home on 3lpm  Cough: no Sleeping: 2 pillows/ bed flat  SABA use: duoneb bid / can't afford bevespi and did not note change on vs off it 02: as above  No flares of copd since prior  rec Duoneb (combination of ipatropium and albuterol) is up to 4 x daily  Only use your albuterol as a rescue medication      02/27/2020  f/u ov/[name] Watkins/Donna Watkins re: GOLD IV / o2 dep / never took pepcid and rash went away while still on omeprazole  Chief Complaint  Patient presents with   Follow-up    Needs o2 recert for [employer]. Breathing has been doing well. She is using her albuterol/atrovent nebs together 3-4 x per day and albuterol inhaler once per wk on average.    Dyspnea:  Still walking to MB x 100 ft flat grade / slower pace than nl = MMRC3 = can't walk 100 yards even at a slow pace at a flat grade s stopping due to sob / now walking instead of riding  @ [employer] but still can't do wm Cough: none  Sleeping: flat bed/ 2 pillow no noctornal resp symptoms  SABA use: using as maint rx but only tid 02: 3lpm hs and 2-3 lpm rest of the time  Covid status: moderna x 2 06/01/19  Lung cancer screening: n/a  Rec  I very strongly recommend you get the third  moderna vaccine  No  change medications    08/14/2020  f/u ov/Donna Watkins/Merisa Julio re: GOLD IV COPD/ 02 dep/ maint on duoneb as can't afford bevespi   Chief Complaint  Patient presents with   Follow-up    Breathing is unchanged. She has minimal dry cough. She states having PET scan soon to eval nodules found on CT done to eval back pain.   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    Dyspnea:  no longer doing mb  but is shopping ok at [employer]  Cough: none  Sleeping: no resp cc  SABA use: maybe once a day  02: 3lpm hs and daytime does not titrate  Covid status: vax x 4  Dysphagia x one year preceded RT and already on ppi s overt hb     No obvious day to day or daytime variability or assoc excess/ purulent sputum or mucus plugs or hemoptysis or cp or chest tightness, subjective wheeze or overt sinus or hb symptoms.   Sleeping  without nocturnal  or early am exacerbation  of respiratory  c/o's or need for noct saba. Also denies any obvious fluctuation of symptoms with weather or environmental changes or other aggravating or alleviating factors except as outlined above   No unusual exposure hx or h/o childhood pna/ asthma or knowledge of premature birth.  Current Allergies, Complete Past Medical History, Past Surgical History, Family  History,  and Social History were reviewed in [employer] record.  ROS  The following are not active complaints unless bolded Hoarseness, sore throat, dysphagia, dental problems, itching, sneezing,  nasal congestion or discharge of excess mucus or purulent secretions, ear ache,   fever, chills, sweats, unintended wt loss or wt gain, classically pleuritic or exertional cp,  orthopnea pnd or arm/hand swelling  or leg swelling, presyncope, palpitations, abdominal pain, anorexia, nausea, vomiting, diarrhea  or change in bowel habits or change in bladder habits, change in stools or change in urine, dysuria, hematuria,  rash, arthralgias, visual complaints, headache, numbness, weakness or ataxia or problems with walking or coordination,  change in mood or  memory.        Current Meds  Medication Sig   albuterol (PROVENTIL) (2.5 MG/3ML) 0.083% nebulizer solution Take 3 mLs (2.5 mg total) by nebulization in the morning, at noon, in the evening, and at bedtime. Mixes with Ipratropium solution   albuterol (VENTOLIN HFA) 108 (90 Base) MCG/ACT inhaler Inhale 2 puffs into the lungs every 6 (six) hours as needed for wheezing or shortness of breath.   ALPRAZolam (XANAX) 1 MG tablet Take 1 mg by mouth 5 (five) times daily as needed.   atorvastatin (LIPITOR) 20 MG tablet Take 20 mg by mouth daily.   ibuprofen (ADVIL) 200 MG tablet Take 200 mg by mouth every 6 (six) hours as needed for mild pain.   ipratropium (ATROVENT) 0.02 % nebulizer solution Take 0.5 mg by nebulization 4 (four) times daily. Mixes with Albuterol solution   Multiple Vitamins-Minerals (CENTRUM SILVER PO) Take 1 tablet by mouth daily.   nystatin cream (MYCOSTATIN) Apply 1 application topically 2 (two) times daily as needed (rash).   omeprazole (PRILOSEC) 20 MG capsule Take 20 mg by mouth daily.   OXYGEN Place 2 L into the nose continuous.   traZODone (DESYREL) 50 MG tablet Take 50 mg by mouth at bedtime.                                Objective:   Physical Exam  Wt  02/19/2021          114  08/14/2020         120  02/27/2020         116 08/28/2019         107  05/26/2019       106  07/04/2018        129 01/03/2018      126  07/02/2017        128 12/31/2016      136   07/31/16 145 lb 9.6 oz (66 kg)  01/22/14 180 lb (81.6 kg)  05/23/13 180 lb 12.8 oz (82 kg)      01/03/18 126 lb (57.2 kg)  07/02/17 128 lb 9.6 oz (58.3 kg)  02/04/17 130 lb (59 kg)    Vital signs reviewed  02/19/2021  - Note at rest 02 sats  94% on 2lpm   General appearance:    chronically ill amb wf nad     HEENT : pt wearing mask not removed for exam due to covid -19 concerns.    NECK :  without JVD/Nodes/TM/ nl carotid upstrokes bilaterally   LUNGS: no acc muscle use,  Mod barrel  contour chest wall with bilateral  Distant bs s audible wheeze and  without cough on insp or exp maneuvers and mod  Hyperresonant  to  percussion bilaterally     CV:  RRR  no s3 or murmur or increase in P2, and no edema   ABD:  soft and nontender with pos mid insp Hoover's  in the supine position. No bruits or organomegaly appreciated, bowel sounds nl  MS:     ext warm without deformities, calf tenderness, cyanosis or clubbing No obvious joint restrictions   SKIN: warm and dry without lesions    NEURO:  alert, approp, nl sensorium with  no motor or cerebellar deficits apparent.            Assessment & Plan:

## 2021-02-20 ENCOUNTER — Encounter: Payer: Self-pay | Admitting: Internal Medicine

## 2021-02-20 DIAGNOSIS — R1310 Dysphagia, unspecified: Secondary | ICD-10-CM | POA: Insufficient documentation

## 2021-02-20 NOTE — Assessment & Plan Note (Signed)
Onset winter/spring 2022 (prior to RT fall 2022)  - rec GI eval   She says due for colonoscopy anyway but Dr Oneida Alar has moved. Directed her back to PCP to coordinate both at same time.           Each maintenance medication was reviewed in detail including emphasizing most importantly the difference between maintenance and prns and under what circumstances the prns are to be triggered using an action plan format where appropriate.  Total time for H and P, chart review, counseling, reviewing neb//02  device(s) and generating customized AVS unique to this office visit / same day charting =30 min

## 2021-02-20 NOTE — Assessment & Plan Note (Signed)
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

## 2021-02-20 NOTE — Assessment & Plan Note (Signed)
Quit smoking 2013/MM - PFT's  04/27/12  FEV1 0.70 (26 %) ratio 35  p no % improvement from saba p ? prior to study with DLCO  53 % corrects to 69 % for alv volume with classic curvature   - Alpha one AT screening   07/31/16 >   MM - PFT's  12/31/2016  FEV1 0.74 (28 % ) ratio 38  p no % improvement from saba p saba 4h   prior to study with DLCO  37/38c % corrects to 50 % for alv volume   - 07/02/2017   try bevespi: did not use correctly  - 01/03/2018    try bevespi again > could not afford and could not tell really helped vs duoneb - 05/26/2019  After extensive coaching inhaler device,  effectiveness =    90% with smi > try stiolto> could not afford it on her plan >   - as of 08/14/2020 maint on duoneb qid   Well compensated on affordable rx , no change needed for now

## 2021-02-20 NOTE — Assessment & Plan Note (Signed)
07/31/2016  Patient Saturations on Room Air at Rest = 88% and while Ambulating = 83% - HC03  07/31/2016  = 38  - 07/31/2016   Walked 3lpm  2 laps @ 185 ft each stopped due to  Sob at moderate pace with no desat  - RA  = 85% 01/03/2018 corrected to 92 on 2lpm continuous  -  05/26/2019   Walked 2lpm cont x  approx   200 ft  @ moderate pace  stopped due to  Sob with sats still 91%   -  08/28/2019   Walked 2.5 lpm  approx   400 ft  @ moderate pace  stopped due to  Sob with sats still   90% - 02 sats at rst 85% RA 02/27/2020  - 08/14/2020 referred to [facility] for best fit eval   Doing well on present rx but again advised: Make sure you check your oxygen saturation  AT  your highest level of activity (not after you stop)   to be sure it stays over 90% and adjust  02 flow upward to maintain this level if needed but remember to turn it back to previous settings when you stop (to conserve your supply).

## 2021-03-03 ENCOUNTER — Telehealth: Payer: Self-pay | Admitting: Internal Medicine

## 2021-03-03 DIAGNOSIS — J449 Chronic obstructive pulmonary disease, unspecified: Secondary | ICD-10-CM

## 2021-03-04 NOTE — Telephone Encounter (Signed)
Called and LVM in regards to patient needing nebulizer. Will try again later.

## 2021-03-05 NOTE — Addendum Note (Signed)
Addended by: Gavin Potters R on: 03/05/2021 02:24 PM ? ? Modules accepted: Orders ? ?

## 2021-03-05 NOTE — Telephone Encounter (Signed)
ATC LVMTCB x 1  

## 2021-03-05 NOTE — Telephone Encounter (Signed)
Spoke with pt and notified here I would place the order for a new nebulizer today with [employer]. Pt stated understanding and order was placed. Nothing further needed at this time.  ?

## 2021-03-12 ENCOUNTER — Ambulatory Visit (HOSPITAL_COMMUNITY)
Admission: RE | Admit: 2021-03-12 | Discharge: 2021-03-12 | Disposition: A | Discharge status: Home / Self Care | Payer: Medicare PPO | Source: Ambulatory Visit | Attending: Hematology | Admitting: Hematology

## 2021-03-12 ENCOUNTER — Other Ambulatory Visit: Payer: Self-pay

## 2021-03-12 ENCOUNTER — Encounter (HOSPITAL_COMMUNITY): Payer: Self-pay | Admitting: Radiology

## 2021-03-12 DIAGNOSIS — C3490 Malignant neoplasm of unspecified part of unspecified bronchus or lung: Secondary | ICD-10-CM

## 2021-03-12 DIAGNOSIS — R911 Solitary pulmonary nodule: Secondary | ICD-10-CM | POA: Diagnosis present

## 2021-03-12 LAB — POCT I-STAT CREATININE: Creatinine, Ser: 0.8 mg/dL (ref 0.44–1.00)

## 2021-03-12 MED ORDER — IOHEXOL 300 MG/ML  SOLN
100.0000 mL | Freq: Once | INTRAMUSCULAR | Status: AC | PRN
Start: 2021-03-12 — End: 2021-03-12
  Administered 2021-03-12: 75 mL via INTRAVENOUS

## 2021-03-19 ENCOUNTER — Inpatient Hospital Stay (HOSPITAL_COMMUNITY): Payer: Medicare PPO | Attending: Hematology | Admitting: Hematology

## 2021-03-19 ENCOUNTER — Other Ambulatory Visit: Payer: Self-pay

## 2021-03-19 VITALS — BP 120/76 | HR 89 | Temp 98.0°F | Resp 18 | Wt 112.4 lb

## 2021-03-19 DIAGNOSIS — R911 Solitary pulmonary nodule: Secondary | ICD-10-CM | POA: Diagnosis not present

## 2021-03-19 DIAGNOSIS — C3492 Malignant neoplasm of unspecified part of left bronchus or lung: Secondary | ICD-10-CM | POA: Diagnosis not present

## 2021-03-19 DIAGNOSIS — Z9981 Dependence on supplemental oxygen: Secondary | ICD-10-CM | POA: Diagnosis not present

## 2021-03-19 DIAGNOSIS — Z79899 Other long term (current) drug therapy: Secondary | ICD-10-CM | POA: Diagnosis not present

## 2021-03-19 DIAGNOSIS — C3490 Malignant neoplasm of unspecified part of unspecified bronchus or lung: Secondary | ICD-10-CM | POA: Diagnosis not present

## 2021-03-19 DIAGNOSIS — Z87891 Personal history of nicotine dependence: Secondary | ICD-10-CM | POA: Diagnosis not present

## 2021-03-19 DIAGNOSIS — C3412 Malignant neoplasm of upper lobe, left bronchus or lung: Secondary | ICD-10-CM

## 2021-03-19 DIAGNOSIS — J449 Chronic obstructive pulmonary disease, unspecified: Secondary | ICD-10-CM | POA: Diagnosis not present

## 2021-03-19 NOTE — Patient Instructions (Signed)
[place] at [facility] ?Discharge Instructions ? ?You were seen and examined today by Dr. Delton Coombes. Start drinking high calorie boost. He reviewed your most recent labs and scans and everything is looking good. Please keep follow up appointments as scheduled in 4 months. ? ? ?Thank you for choosing [place] at [facility] to provide your oncology and hematology care.  To afford each patient quality time with our provider, please arrive at least 15 minutes before your scheduled appointment time.  ? ?If you have a lab appointment with the [facility] please come in thru the Main Entrance and check in at the main information desk. ? ?You need to re-schedule your appointment should you arrive 10 or more minutes late.  We strive to give you quality time with our providers, and arriving late affects you and other patients whose appointments are after yours.  Also, if you no show three or more times for appointments you may be dismissed from the clinic at the providers discretion.     ?Again, thank you for choosing [facility].  Our hope is that these requests will decrease the amount of time that you wait before being seen by our physicians.       ?_____________________________________________________________ ? ?Should you have questions after your visit to [facility], please contact our office at [phone] and follow the prompts.  Our office hours are 8:00 a.m. and 4:30 p.m. Monday - Friday.  Please note that voicemails left after 4:00 p.m. may not be returned until the following business day.  We are closed weekends and major holidays.  You do have access to a nurse 24-7, just call the main number to the clinic [phone] and do not press any options, hold on the line and a nurse will answer the phone.   ? ?For prescription refill requests, have your pharmacy contact our office and allow 72 hours.   ? ?Due to Covid, you will need to  wear a mask upon entering the hospital. If you do not have a mask, a mask will be given to you at the Main Entrance upon arrival. For doctor visits, patients may have 1 support person age 60 or older with them. For treatment visits, patients can not have anyone with them due to social distancing guidelines and our immunocompromised population.  ? ?  ?

## 2021-03-19 NOTE — Progress Notes (Signed)
[facility] [address], [place] [address]   CLINIC:  Medical Oncology/Hematology  PCP:  Gareth Morgan, MD [address] / [place] [address] [phone]   REASON FOR VISIT:  Follow-up for left upper lobe lung nodule  PRIOR THERAPY: none  NGS Results: not done  CURRENT THERAPY: surveillance  BRIEF ONCOLOGIC HISTORY:  Oncology History  Squamous carcinoma of lung, left (HCC)  11/04/2020 Cancer Staging   Staging form: Lung, AJCC 8th Edition - Clinical stage from 11/04/2020: cT1, cN0, cM0 - Signed by Doreatha Massed, MD on 11/12/2020 Stage prefix: Initial diagnosis    11/12/2020 Initial Diagnosis   Squamous carcinoma of lung, left (HCC)     CANCER STAGING:  Cancer Staging  Squamous carcinoma of lung, left (HCC) Staging form: Lung, AJCC 8th Edition - Clinical stage from 11/04/2020: cT1, cN0, cM0 - Signed by Doreatha Massed, MD on 11/12/2020   INTERVAL HISTORY:  [name], a 61 y.o. female, returns for routine follow-up of her left upper lobe lung nodule. Donna Watkins was last seen on 11/12/2020.   Today she reports feeling well. She has lost 6 lbs since her last visit. She does not currently drink Boost/Ensure. She reports occasional dry cough, but she denies productive cough.   REVIEW OF SYSTEMS:  Review of Systems  Constitutional:  Positive for unexpected weight change (-6 lbs). Negative for appetite change and fatigue.  HENT:   Positive for trouble swallowing.   Respiratory:  Positive for cough (dry).   Musculoskeletal:  Positive for back pain.  All other systems reviewed and are negative.  PAST MEDICAL/SURGICAL HISTORY:  Past Medical History:  Diagnosis Date   Anxiety    panic attacks   COPD (chronic obstructive pulmonary disease) (HCC) 03/01/2012   Depression    GERD (gastroesophageal reflux disease)    History of radiation therapy    Left Lung- 12/09/20-12/19/20- Dr. Antony Blackbird   Hyperlipidemia     Oxygen dependent    Past Surgical History:  Procedure Laterality Date   BRONCHIAL BIOPSY  11/04/2020   Procedure: BRONCHIAL BIOPSIES;  Surgeon: Leslye Peer, MD;  Location: [facility] ENDOSCOPY;  Service: Pulmonary;;   BRONCHIAL BRUSHINGS  11/04/2020   Procedure: BRONCHIAL BRUSHINGS;  Surgeon: Leslye Peer, MD;  [facility] ENDOSCOPY;  Service: Pulmonary;;   BRONCHIAL NEEDLE ASPIRATION BIOPSY  11/04/2020   Procedure: BRONCHIAL NEEDLE ASPIRATION BIOPSIES;  Surgeon: Leslye Peer, MD;  Location: [facility] ENDOSCOPY;  Service: Pulmonary;;   COLONOSCOPY N/A 09/09/2012   Procedure: COLONOSCOPY;  Surgeon: West Bali, MD;  Location: [facility] ENDO SUITE;  Service: Endoscopy;  Laterality: N/A;  1:15-moved to 12:45 Melanie notified pt   FIDUCIAL MARKER PLACEMENT  11/04/2020   Procedure: FIDUCIAL MARKER PLACEMENT;  Surgeon: Leslye Peer, MD;  Location: [facility] ENDOSCOPY;  Service: Pulmonary;;   TUBAL LIGATION     VIDEO BRONCHOSCOPY WITH ENDOBRONCHIAL NAVIGATION N/A 11/04/2020   Procedure: ROBOTIC  VIDEO BRONCHOSCOPY WITH ENDOBRONCHIAL NAVIGATION;  Surgeon: Leslye Peer, MD;  Location: [facility] ENDOSCOPY;  Service: Pulmonary;  Laterality: N/A;    SOCIAL HISTORY:  Social History   Socioeconomic History   Marital status: Divorced    Spouse name: Not on file   Number of children: Not on file   Years of education: Not on file   Highest education level: Not on file  Occupational History   Occupation: Disabled  Tobacco Use   Smoking status: Former    Packs/day: 2.50    Years: 30.00  Pack years: 75.00    Types: Cigarettes    Quit date: 06/12/2011    Years since quitting: 9.7   Smokeless tobacco: Never  Vaping Use   Vaping Use: Never used  Substance and Sexual Activity   Alcohol use: No   Drug use: No   Sexual activity: Not on file  Other Topics Concern   Not on file  Social History Narrative   Disability.    Lives with sister   Social Determinants of Health   Financial Resource Strain: Low Risk     Difficulty of Paying Living Expenses: Not very hard  Food Insecurity: No Food Insecurity   Worried About Programme researcher, broadcasting/film/video in the Last Year: Never true   Ran Out of Food in the Last Year: Never true  Transportation Needs: Unmet Transportation Needs   Lack of Transportation (Medical): Yes   Lack of Transportation (Non-Medical): No  Physical Activity: Inactive   Days of Exercise per Week: 0 days   Minutes of Exercise per Session: 0 min  Stress: No Stress Concern Present   Feeling of Stress : Not at all  Social Connections: Socially Isolated   Frequency of Communication with Friends and Family: More than three times a week   Frequency of Social Gatherings with Friends and Family: Once a week   Attends Religious Services: Never   Database administrator or Organizations: No   Attends Engineer, structural: Never   Marital Status: Divorced  Catering manager Violence: Not At Risk   Fear of Current or Ex-Partner: No   Emotionally Abused: No   Physically Abused: No   Sexually Abused: No    FAMILY HISTORY:  Family History  Problem Relation Age of Onset   COPD Mother    Heart disease Father    Colon cancer Neg Hx     CURRENT MEDICATIONS:  Current Outpatient Medications  Medication Sig Dispense Refill   albuterol (PROVENTIL) (2.5 MG/3ML) 0.083% nebulizer solution Take 3 mLs (2.5 mg total) by nebulization in the morning, at noon, in the evening, and at bedtime. Mixes with Ipratropium solution     albuterol (VENTOLIN HFA) 108 (90 Base) MCG/ACT inhaler Inhale 2 puffs into the lungs every 6 (six) hours as needed for wheezing or shortness of breath.     ALPRAZolam (XANAX) 1 MG tablet Take 1 mg by mouth 5 (five) times daily as needed.     atorvastatin (LIPITOR) 20 MG tablet Take 20 mg by mouth daily.     ibuprofen (ADVIL) 200 MG tablet Take 200 mg by mouth every 6 (six) hours as needed for mild pain.     ipratropium (ATROVENT) 0.02 % nebulizer solution Take 0.5 mg by nebulization  4 (four) times daily. Mixes with Albuterol solution     Multiple Vitamins-Minerals (CENTRUM SILVER PO) Take 1 tablet by mouth daily.     nystatin cream (MYCOSTATIN) Apply 1 application topically 2 (two) times daily as needed (rash).     omeprazole (PRILOSEC) 20 MG capsule Take 20 mg by mouth daily.     OXYGEN Place 2 L into the nose continuous.     traZODone (DESYREL) 50 MG tablet Take 50 mg by mouth at bedtime.     No current facility-administered medications for this visit.    ALLERGIES:  No Known Allergies  PHYSICAL EXAM:  Performance status (ECOG): 1 - Symptomatic but completely ambulatory  There were no vitals filed for this visit. Wt Readings from Last 3 Encounters:  02/19/21 114  lb 1.3 oz (51.7 kg)  01/27/21 114 lb 6 oz (51.9 kg)  11/20/20 118 lb 6.4 oz (53.7 kg)   Physical Exam Vitals reviewed.  Constitutional:      Appearance: Normal appearance.     Interventions: Nasal cannula in place.  Cardiovascular:     Rate and Rhythm: Normal rate and regular rhythm.     Pulses: Normal pulses.     Heart sounds: Normal heart sounds.  Pulmonary:     Effort: Pulmonary effort is normal.     Breath sounds: Normal breath sounds.  Neurological:     General: No focal deficit present.     Mental Status: She is alert and oriented to person, place, and time.  Psychiatric:        Mood and Affect: Mood normal.        Behavior: Behavior normal.     LABORATORY DATA:  I have reviewed the labs as listed.  CBC Latest Ref Rng & Units 11/04/2020 07/31/2016 06/27/2012  WBC 4.0 - 10.5 K/uL 5.6 5.6 -  Hemoglobin 12.0 - 15.0 g/dL 19.1 47.8 29.5  Hematocrit 36.0 - 46.0 % 39.3 41.8 40  Platelets 150 - 400 K/uL 180 222.0 -   CMP Latest Ref Rng & Units 03/12/2021 07/31/2016 06/30/2011  Glucose 70 - 99 mg/dL - 621(H) 086(V)  BUN 6 - 23 mg/dL - 8 13  Creatinine 7.84 - 1.00 mg/dL 6.96 2.95 0.5  Sodium 284 - 145 mEq/L - 140 143  Potassium 3.5 - 5.1 mEq/L - 3.2(L) 4.4  Chloride 96 - 112 mEq/L - 98 99   CO2 19 - 32 mEq/L - 38(H) 38(H)  Calcium 8.4 - 10.5 mg/dL - 9.2 8.9  Total Protein 6.0 - 8.3 g/dL - - -  Total Bilirubin 0.3 - 1.2 mg/dL - - -  Alkaline Phos 39 - 117 U/L - - -  AST 0 - 37 U/L - - -  ALT 0 - 35 U/L - - -    DIAGNOSTIC IMAGING:  I have independently reviewed the scans and discussed with the patient. CT Chest W Contrast  Result Date: 03/13/2021 CLINICAL DATA:  61 year old female with history of non-small cell lung cancer status post radiation therapy in November 2022. Follow-up study. EXAM: CT CHEST WITH CONTRAST TECHNIQUE: Multidetector CT imaging of the chest was performed during intravenous contrast administration. RADIATION DOSE REDUCTION: This exam was performed according to the departmental dose-optimization program which includes automated exposure control, adjustment of the mA and/or kV according to patient size and/or use of iterative reconstruction technique. CONTRAST:  75mL OMNIPAQUE IOHEXOL 300 MG/ML  SOLN COMPARISON:  Multiple priors, most recently chest CT 11/01/2020 and PET-CT 09/19/2020. FINDINGS: Cardiovascular: Heart size is normal. There is no significant pericardial fluid, thickening or pericardial calcification. There is aortic atherosclerosis, as well as atherosclerosis of the great vessels of the mediastinum and the coronary arteries, including calcified atherosclerotic plaque in the left anterior descending coronary artery. Mediastinum/Nodes: No pathologically enlarged mediastinal or hilar lymph nodes. Esophagus is unremarkable in appearance. No axillary lymphadenopathy. Lungs/Pleura: Fiducial markers are noted in the medial aspect of the left upper lobe adjacent to the treated lesion, which is now only an ill-defined nodular area of architectural distortion in the medial aspect of the left upper lobe (axial image 52 of series 4), currently measuring 2.6 x 1.2 cm. 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 No other new suspicious appearing pulmonary nodules  or masses are noted. No acute consolidative airspace disease. No pleural effusions. Diffuse bronchial wall thickening with severe centrilobular and paraseptal emphysema. Upper Abdomen: 7 mm low-attenuation lesion in segment 8 of the liver, too small to characterize, but statistically likely to represent a tiny cyst. Musculoskeletal: There are no aggressive appearing lytic or blastic lesions noted in the visualized portions of the skeleton. IMPRESSION: 1. Today's study demonstrates a positive response to therapy with regression of previously noted left upper lobe mass. Today's study will serve as a new baseline for future follow-up examinations. 2. Diffuse bronchial wall thickening with severe centrilobular and paraseptal emphysema; imaging findings suggestive of underlying COPD. 3. Aortic atherosclerosis, in addition to left anterior descending coronary artery disease. Please note that although the presence of coronary artery calcium documents the presence of coronary artery disease, the severity of this disease and any potential stenosis cannot be assessed on this non-gated CT examination. Assessment for potential risk factor modification, dietary therapy or pharmacologic therapy may be warranted, if clinically indicated. Aortic Atherosclerosis (ICD10-I70.0) and Emphysema (ICD10-J43.9). Electronically Signed   By: Trudie Reed M.D.   On: 03/13/2021 11:30     ASSESSMENT:  1.  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 - No weight loss reported.  No chest pains. - MRI of the thoracic spine on 07/25/2020 showed incidental left upper lobe nodule measuring 2.3 x 1.5 x 1.7 cm. - CT of the chest with contrast on 07/31/2020 showed irregular nodule of the posteromedial left upper lobe abutting the pleura  measuring 2.4 x 1.6 cm.  No pleural effusion or pneumothorax.  No adenopathy in the chest. - Denies any recent infections or COVID. - She has been on oxygen 24/7 for the last 10 years secondary to COPD. - SBRT to the left lung malignancy from 12/12/2020 - 12/19/2020, 50 Gray in 5 fractions.  2.  Social/family history: - She lives with her sister at home and is independent of ADLs and IADLs. - She has been on disability. - She quit smoking 10 years ago.  Smoked 2 to 3 pack/day for 30 years. - No family history of malignancies.   PLAN:  1.  Stage I left upper lobe squamous cell lung cancer: - She has completed SBRT to the lung on 12/19/2020, 50 Gray in 5 fractions. - She did not have any major side effects from radiation. - However she lost about 6 pounds since November.  She normally eats once daily. - I have recommended nutritional supplement with 1 can of high-protein boost. - Reviewed CT chest with contrast from 03/12/2021 which showed positive response to therapy with regression of the left upper lobe lung mass.  No signs of progression.  Other nonmalignant findings were discussed. - Recommend follow-up with CT of the chest in 4 months.  If it continues to be stable, we will switch her to 6 months.   Orders placed this encounter:  No orders of the defined types were placed in this encounter.    Doreatha Massed, MD [facility] [phone]   I, Alda Ponder, am acting as a scribe for Dr. Doreatha Massed.  I, Doreatha Massed MD, have reviewed the above documentation for accuracy and completeness, and I agree with the above.

## 2021-05-13 ENCOUNTER — Other Ambulatory Visit (HOSPITAL_COMMUNITY): Payer: Self-pay | Admitting: Family Medicine

## 2021-05-13 DIAGNOSIS — Z1231 Encounter for screening mammogram for malignant neoplasm of breast: Secondary | ICD-10-CM

## 2021-05-26 ENCOUNTER — Ambulatory Visit (HOSPITAL_COMMUNITY)
Admission: RE | Admit: 2021-05-26 | Discharge: 2021-05-26 | Disposition: A | Discharge status: Home / Self Care | Payer: Medicare PPO | Source: Ambulatory Visit | Attending: Family Medicine | Admitting: Family Medicine

## 2021-05-26 DIAGNOSIS — Z1231 Encounter for screening mammogram for malignant neoplasm of breast: Secondary | ICD-10-CM

## 2021-06-03 ENCOUNTER — Other Ambulatory Visit (HOSPITAL_COMMUNITY): Payer: Self-pay | Admitting: Family Medicine

## 2021-06-03 DIAGNOSIS — R928 Other abnormal and inconclusive findings on diagnostic imaging of breast: Secondary | ICD-10-CM

## 2021-06-04 ENCOUNTER — Ambulatory Visit (HOSPITAL_COMMUNITY)
Admission: RE | Admit: 2021-06-04 | Discharge: 2021-06-04 | Disposition: A | Discharge status: Home / Self Care | Payer: Medicare PPO | Source: Ambulatory Visit | Attending: Family Medicine | Admitting: Family Medicine

## 2021-06-04 DIAGNOSIS — R928 Other abnormal and inconclusive findings on diagnostic imaging of breast: Secondary | ICD-10-CM | POA: Insufficient documentation

## 2021-06-06 ENCOUNTER — Other Ambulatory Visit (HOSPITAL_COMMUNITY): Payer: Self-pay | Admitting: Family Medicine

## 2021-06-06 ENCOUNTER — Other Ambulatory Visit: Payer: Self-pay | Admitting: Family Medicine

## 2021-06-06 DIAGNOSIS — R928 Other abnormal and inconclusive findings on diagnostic imaging of breast: Secondary | ICD-10-CM

## 2021-06-12 ENCOUNTER — Other Ambulatory Visit: Payer: Self-pay | Admitting: Family Medicine

## 2021-06-12 ENCOUNTER — Ambulatory Visit
Admission: RE | Admit: 2021-06-12 | Discharge: 2021-06-12 | Disposition: A | Discharge status: Home / Self Care | Payer: Medicare PPO | Source: Ambulatory Visit | Attending: Family Medicine | Admitting: Family Medicine

## 2021-06-12 DIAGNOSIS — R928 Other abnormal and inconclusive findings on diagnostic imaging of breast: Secondary | ICD-10-CM

## 2021-07-14 ENCOUNTER — Ambulatory Visit (HOSPITAL_COMMUNITY)
Admission: RE | Admit: 2021-07-14 | Discharge: 2021-07-14 | Disposition: A | Discharge status: Home / Self Care | Payer: Medicare PPO | Source: Ambulatory Visit | Attending: Hematology | Admitting: Hematology

## 2021-07-14 ENCOUNTER — Inpatient Hospital Stay (HOSPITAL_COMMUNITY): Payer: Medicare PPO | Attending: Hematology

## 2021-07-14 DIAGNOSIS — K5900 Constipation, unspecified: Secondary | ICD-10-CM | POA: Insufficient documentation

## 2021-07-14 DIAGNOSIS — Z87891 Personal history of nicotine dependence: Secondary | ICD-10-CM | POA: Diagnosis not present

## 2021-07-14 DIAGNOSIS — C3492 Malignant neoplasm of unspecified part of left bronchus or lung: Secondary | ICD-10-CM | POA: Diagnosis present

## 2021-07-14 DIAGNOSIS — I700 Atherosclerosis of aorta: Secondary | ICD-10-CM | POA: Insufficient documentation

## 2021-07-14 DIAGNOSIS — Z836 Family history of other diseases of the respiratory system: Secondary | ICD-10-CM | POA: Diagnosis not present

## 2021-07-14 DIAGNOSIS — C3490 Malignant neoplasm of unspecified part of unspecified bronchus or lung: Secondary | ICD-10-CM

## 2021-07-14 DIAGNOSIS — Z9981 Dependence on supplemental oxygen: Secondary | ICD-10-CM | POA: Insufficient documentation

## 2021-07-14 DIAGNOSIS — Z79899 Other long term (current) drug therapy: Secondary | ICD-10-CM | POA: Insufficient documentation

## 2021-07-14 DIAGNOSIS — R911 Solitary pulmonary nodule: Secondary | ICD-10-CM

## 2021-07-14 DIAGNOSIS — R0602 Shortness of breath: Secondary | ICD-10-CM | POA: Insufficient documentation

## 2021-07-14 DIAGNOSIS — J449 Chronic obstructive pulmonary disease, unspecified: Secondary | ICD-10-CM | POA: Insufficient documentation

## 2021-07-14 DIAGNOSIS — C3412 Malignant neoplasm of upper lobe, left bronchus or lung: Secondary | ICD-10-CM | POA: Diagnosis present

## 2021-07-14 DIAGNOSIS — M549 Dorsalgia, unspecified: Secondary | ICD-10-CM | POA: Insufficient documentation

## 2021-07-14 DIAGNOSIS — Z8249 Family history of ischemic heart disease and other diseases of the circulatory system: Secondary | ICD-10-CM | POA: Insufficient documentation

## 2021-07-14 DIAGNOSIS — J439 Emphysema, unspecified: Secondary | ICD-10-CM | POA: Diagnosis not present

## 2021-07-14 DIAGNOSIS — I2510 Atherosclerotic heart disease of native coronary artery without angina pectoris: Secondary | ICD-10-CM | POA: Insufficient documentation

## 2021-07-14 LAB — COMPREHENSIVE METABOLIC PANEL
ALT: 17 U/L (ref 0–44)
AST: 20 U/L (ref 15–41)
Albumin: 3.9 g/dL (ref 3.5–5.0)
Alkaline Phosphatase: 46 U/L (ref 38–126)
Anion gap: 3 — ABNORMAL LOW (ref 5–15)
BUN: 21 mg/dL — ABNORMAL HIGH (ref 6–20)
CO2: 40 mmol/L — ABNORMAL HIGH (ref 22–32)
Calcium: 8.7 mg/dL — ABNORMAL LOW (ref 8.9–10.3)
Chloride: 98 mmol/L (ref 98–111)
Creatinine, Ser: 0.45 mg/dL (ref 0.44–1.00)
GFR, Estimated: >60 mL/min (ref >60)
Glucose, Bld: 102 mg/dL — ABNORMAL HIGH (ref 70–99)
Potassium: 3.7 mmol/L (ref 3.5–5.1)
Sodium: 141 mmol/L (ref 135–145)
Total Bilirubin: 0.7 mg/dL (ref 0.3–1.2)
Total Protein: 6.8 g/dL (ref 6.5–8.1)

## 2021-07-14 LAB — CBC WITH DIFFERENTIAL/PLATELET
Abs Immature Granulocytes: 0 10*3/uL (ref 0.00–0.07)
Basophils Absolute: 0 10*3/uL (ref 0.0–0.1)
Basophils Relative: 1 %
Eosinophils Absolute: 0.1 10*3/uL (ref 0.0–0.5)
Eosinophils Relative: 4 %
HCT: 37.9 % (ref 36.0–46.0)
Hemoglobin: 11.3 g/dL — ABNORMAL LOW (ref 12.0–15.0)
Immature Granulocytes: 0 %
Lymphocytes Relative: 22 %
Lymphs Abs: 0.7 10*3/uL (ref 0.7–4.0)
MCH: 30.2 pg (ref 26.0–34.0)
MCHC: 29.8 g/dL — ABNORMAL LOW (ref 30.0–36.0)
MCV: 101.3 fL — ABNORMAL HIGH (ref 80.0–100.0)
Monocytes Absolute: 0.2 10*3/uL (ref 0.1–1.0)
Monocytes Relative: 5 %
Neutro Abs: 2.1 10*3/uL (ref 1.7–7.7)
Neutrophils Relative %: 68 %
Platelets: 115 10*3/uL — ABNORMAL LOW (ref 150–400)
RBC: 3.74 MIL/uL — ABNORMAL LOW (ref 3.87–5.11)
RDW: 11.5 % (ref 11.5–15.5)
WBC: 3.1 10*3/uL — ABNORMAL LOW (ref 4.0–10.5)
nRBC: 0 % (ref 0.0–0.2)

## 2021-07-14 MED ORDER — IOHEXOL 300 MG/ML  SOLN
75.0000 mL | Freq: Once | INTRAMUSCULAR | Status: AC | PRN
  Administered 2021-07-14: 75 mL via INTRAVENOUS

## 2021-07-14 MED ORDER — IOHEXOL 350 MG/ML SOLN
75.0000 mL | Freq: Once | INTRAVENOUS | Status: DC | PRN
Start: 2021-07-14 — End: 2021-07-15

## 2021-07-21 ENCOUNTER — Inpatient Hospital Stay (HOSPITAL_BASED_OUTPATIENT_CLINIC_OR_DEPARTMENT_OTHER): Payer: Medicare PPO | Admitting: Hematology

## 2021-07-21 VITALS — BP 108/71 | HR 108 | Temp 97.0°F | Resp 19 | Ht 63.0 in | Wt 105.9 lb

## 2021-07-21 DIAGNOSIS — C3490 Malignant neoplasm of unspecified part of unspecified bronchus or lung: Secondary | ICD-10-CM | POA: Diagnosis not present

## 2021-07-21 DIAGNOSIS — C3412 Malignant neoplasm of upper lobe, left bronchus or lung: Secondary | ICD-10-CM | POA: Diagnosis not present

## 2021-07-21 NOTE — Progress Notes (Signed)
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  PRIOR THERAPY: none  NGS Results: not done  CURRENT THERAPY: surveillance  BRIEF ONCOLOGIC HISTORY:  Oncology History  Squamous carcinoma of lung, left ([place])  11/04/2020 Cancer Staging   Staging form: Lung, AJCC 8th Edition - Clinical stage from 11/04/2020: cT1, cN0, cM0 - Signed by Derek Jack, MD on 11/12/2020 Stage prefix: Initial diagnosis   11/12/2020 Initial Diagnosis   Squamous carcinoma of lung, left ([place])     CANCER STAGING:  Cancer Staging  Squamous carcinoma of lung, left ([place]) Staging form: Lung, AJCC 8th Edition - Clinical stage from 11/04/2020: cT1, cN0, cM0 - Signed by Derek Jack, MD on 11/12/2020   INTERVAL HISTORY:  [name], a 61 y.o. 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 Jacquelyn was last seen on 03/19/2021.   Today she reports feeling good. She denies infections, new cough, and CP. She denies recent antibiotic and steroid use. She has lost 7 lbs since her last visit. She reports her appetite is good, and she is eating well. She is taking vitamin B-12. She reports back pain.   REVIEW OF SYSTEMS:  Review of Systems  Constitutional:  Positive for unexpected weight change (-7 lbs). Negative for appetite change and fatigue.  Respiratory:  Positive for shortness of breath. Negative for cough.   Cardiovascular:  Negative for chest pain.  Gastrointestinal:  Positive for constipation.  Musculoskeletal:  Positive for back pain (5/10).  Psychiatric/Behavioral:  The patient is nervous/anxious.   All other systems reviewed and are negative.   PAST MEDICAL/SURGICAL HISTORY:  Past Medical History:   Diagnosis Date   Anxiety    panic attacks   COPD (chronic obstructive pulmonary disease) ([place]) 03/01/2012   Depression    GERD (gastroesophageal reflux disease)    History of radiation therapy    Left Lung- 12/09/20-12/19/20- Dr. Gery Pray   Hyperlipidemia    Oxygen dependent    Past Surgical History:  Procedure Laterality Date   BRONCHIAL BIOPSY  11/04/2020   Procedure: BRONCHIAL BIOPSIES;  Surgeon: Collene Gobble, MD;  Location: [facility] ENDOSCOPY;  Service: Pulmonary;;   BRONCHIAL BRUSHINGS  11/04/2020   Procedure: BRONCHIAL BRUSHINGS;  Surgeon: Collene Gobble, MD;  Location: [facility];  Service: Pulmonary;;   BRONCHIAL NEEDLE ASPIRATION BIOPSY  11/04/2020   Procedure: BRONCHIAL NEEDLE ASPIRATION BIOPSIES;  Surgeon: Collene Gobble, MD;  Location: [facility] ENDOSCOPY;  Service: Pulmonary;;   COLONOSCOPY N/A 09/09/2012   Procedure: COLONOSCOPY;  Surgeon: Danie Binder, MD;  Location: [facility] END[facility];  Service: Endoscopy;  Laterality: N/A;  1:15-moved to 12:45 Melanie notified pt   FIDUCIAL MARKER PLACEMENT  11/04/2020   Procedure: FIDUCIAL MARKER PLACEMENT;  Surgeon: Collene Gobble, MD;  Location: [facility] ENDOSCOPY;  Service: Pulmonary;;   TUBAL LIGATION     VIDEO BRONCHOSCOPY WITH ENDOBRONCHIAL NAVIGATION N/A 11/04/2020   Procedure: ROBOTIC  VIDEO BRONCHOSCOPY WITH ENDOBRONCHIAL NAVIGATION;  Surgeon: Collene Gobble, MD;  Location: [facility] ENDOSCOPY;  Service: Pulmonary;  Laterality: N/A;    SOCIAL HISTORY:  Social History   Socioeconomic History   Marital status: Divorced    Spouse name: Not on file   Number of children: Not on  file   Years of education: Not on file   Highest education level: Not on file  Occupational History   Occupation: Disabled  Tobacco Use   Smoking status: Former    Packs/day: 2.50    Years: 30.00    Total pack years: 75.00    Types: Cigarettes    Quit date: 06/12/2011    Years since quitting: 10.1   Smokeless tobacco: Never  Vaping Use   Vaping Use: Never  used  Substance and Sexual Activity   Alcohol use: No   Drug use: No   Sexual activity: Not on file  Other Topics Concern   Not on file  Social History Narrative   Disability.    Lives with sister   Social Determinants of Health   Financial Resource Strain: Low Risk  (08/12/2020)   Overall Financial Resource Strain (CARDIA)    Difficulty of Paying Living Expenses: Not very hard  Food Insecurity: No Food Insecurity (08/12/2020)   Hunger Vital Sign    Worried About Running Out of Food in the Last Year: Never true    Ran Out of Food in the Last Year: Never true  Transportation Needs: Unmet Transportation Needs (11/21/2020)   PRAPARE - Hydrologist (Medical): Yes    Lack of Transportation (Non-Medical): No  Physical Activity: Inactive (08/12/2020)   Exercise Vital Sign    Days of Exercise per Week: 0 days    Minutes of Exercise per Session: 0 min  Stress: No Stress Concern Present (08/12/2020)   [place]    Feeling of Stress : Not at all  Social Connections: Socially Isolated (11/21/2020)   Social Connection and Isolation Panel [NHANES]    Frequency of Communication with Friends and Family: More than three times a week    Frequency of Social Gatherings with Friends and Family: Once a week    Attends Religious Services: Never    Marine scientist or Organizations: No    Attends Archivist Meetings: Never    Marital Status: Divorced  Human resources officer Violence: Not At Risk (08/12/2020)   Humiliation, Afraid, Rape, and Kick questionnaire    Fear of Current or Ex-Partner: No    Emotionally Abused: No    Physically Abused: No    Sexually Abused: No    FAMILY HISTORY:  Family History  Problem Relation Age of Onset   COPD Mother    Heart disease Father    Colon cancer Neg Hx     CURRENT MEDICATIONS:  Current Outpatient Medications  Medication Sig Dispense Refill    albuterol (PROVENTIL) (2.5 MG/3ML) 0.083% nebulizer solution Take 3 mLs (2.5 mg total) by nebulization in the morning, at noon, in the evening, and at bedtime. Mixes with Ipratropium solution     albuterol (VENTOLIN HFA) 108 (90 Base) MCG/ACT inhaler Inhale 2 puffs into the lungs every 6 (six) hours as needed for wheezing or shortness of breath.     ALPRAZolam (XANAX) 1 MG tablet Take 1 mg by mouth 5 (five) times daily as needed.     atorvastatin (LIPITOR) 20 MG tablet Take 20 mg by mouth daily.     ibuprofen (ADVIL) 200 MG tablet Take 200 mg by mouth every 6 (six) hours as needed for mild pain.     ipratropium (ATROVENT) 0.02 % nebulizer solution Take 0.5 mg by nebulization 4 (four) times daily. Mixes with Albuterol solution     nystatin cream (  MYCOSTATIN) Apply 1 application topically 2 (two) times daily as needed (rash).     omeprazole (PRILOSEC) 20 MG capsule Take 20 mg by mouth daily.     OXYGEN Place 2 L into the nose continuous.     traZODone (DESYREL) 50 MG tablet Take 50 mg by mouth at bedtime.     Multiple Vitamins-Minerals (CENTRUM SILVER PO) Take 1 tablet by mouth daily.     No current facility-administered medications for this visit.    ALLERGIES:  No Known Allergies  PHYSICAL EXAM:  Performance status (ECOG): 1 - Symptomatic but completely ambulatory  Vitals:   07/21/21 1545  BP: 108/71  Pulse: (!) 108  Resp: 19  Temp: (!) 97 F (36.1 C)  SpO2: 95%   Wt Readings from Last 3 Encounters:  07/21/21 105 lb 14.4 oz (48 kg)  03/19/21 112 lb 6.4 oz (51 kg)  02/19/21 114 lb 1.3 oz (51.7 kg)   Physical Exam Vitals reviewed.  Constitutional:      Appearance: Normal appearance.     Interventions: Nasal cannula in place.  Cardiovascular:     Rate and Rhythm: Normal rate and regular rhythm.     Pulses: Normal pulses.     Heart sounds: Normal heart sounds.  Pulmonary:     Effort: Pulmonary effort is normal.     Breath sounds: Normal breath sounds.  Neurological:      General: No focal deficit present.     Mental Status: She is alert and oriented to person, place, and time.  Psychiatric:        Mood and Affect: Mood normal.        Behavior: Behavior normal.      LABORATORY DATA:  I have reviewed the labs as listed.     Latest Ref Rng & Units 07/14/2021    2:46 PM 11/04/2020    7:47 AM 07/31/2016    5:15 PM  CBC  WBC 4.0 - 10.5 K/uL 3.1  5.6  5.6   Hemoglobin 12.0 - 15.0 g/dL 11.3  12.3  13.4   Hematocrit 36.0 - 46.0 % 37.9  39.3  41.8   Platelets 150 - 400 K/uL 115  180  222.0       Latest Ref Rng & Units 07/14/2021    2:46 PM 03/12/2021    4:20 PM 07/31/2016    5:15 PM  CMP  Glucose 70 - 99 mg/dL 102   100   BUN 6 - 20 mg/dL 21   8   Creatinine 0.44 - 1.00 mg/dL 0.45  0.80  0.77   Sodium 135 - 145 mmol/L 141   140   Potassium 3.5 - 5.1 mmol/L 3.7   3.2   Chloride 98 - 111 mmol/L 98   98   CO2 22 - 32 mmol/L 40   38   Calcium 8.9 - 10.3 mg/dL 8.7   9.2   Total Protein 6.5 - 8.1 g/dL 6.8     Total Bilirubin 0.3 - 1.2 mg/dL 0.7     Alkaline Phos 38 - 126 U/L 46     AST 15 - 41 U/L 20     ALT 0 - 44 U/L 17       DIAGNOSTIC IMAGING:  I have independently reviewed the scans and discussed with the patient. CT Chest W Contrast  Result Date: 07/16/2021 CLINICAL DATA:  Non-small cell lung cancer, left upper lobe nodule. Prior radiation therapy concluding November 2022. * Tracking Code: BO * EXAM: CT  CHEST WITH CONTRAST TECHNIQUE: Multidetector CT imaging of the chest was performed during intravenous contrast administration. RADIATION DOSE REDUCTION: This exam was performed according to the departmental dose-optimization program which includes automated exposure control, adjustment of the mA and/or kV according to patient size and/or use of iterative reconstruction technique. CONTRAST:  44mL OMNIPAQUE IOHEXOL 300 MG/ML  SOLN COMPARISON:  03/12/2021 FINDINGS: Cardiovascular: Aortic arch and left anterior descending coronary artery atherosclerotic  calcification. Mediastinum/Nodes: Small type 1 hiatal hernia. Lungs/Pleura: Centrilobular emphysema. Fiducial adjacent to a peripheral left upper lobe nodule corresponding to the site of the original mass, the local region of pleuroparenchymal thickening measures proximally 2.3 by 0.8 cm on image 56 series 2, when measured in the same fashion this was previously 2.7 by 1.0 cm. In relation to the original mass this represents a substantial response to therapy, and slight improvement compared to 03/12/2021. Bilobed nodule currently 0.7 by 0.5 cm on image 124 of series 4. This has an appearance of branching or clustered nodularity. Slowly progressive from 11/01/2020 Airway thickening is present, suggesting bronchitis or reactive airways disease. Mild scarring in the left lower lobe. Chronic volume loss and scarring in the superior segment left lower lobe. Upper Abdomen: Stable 6 mm hypodense lesion in the right hepatic lobe on image 143 of series 2 is technically too small to characterize but not changed from 07/31/2020, probably benign. Musculoskeletal: Unremarkable IMPRESSION: 1. Further reduction in the peripheral left upper lobe nodule. The bandlike density corresponding to the original nodule and adjacent pleural thickening currently measures 2.3 by 0.8 cm, formerly 2.7 by 1.0 cm by my measurements. 2. Slowly progressive 0.7 by 0.5 cm nodule in the right lower lobe. This may well atypical infectious bronchiolitis given the somewhat branching/clustered appearance, however malignancy is not completely excluded. This is below sensitive PET-CT size thresholds. Careful surveillance suggested. 3. Other imaging findings of potential clinical significance: Aortic Atherosclerosis (ICD10-I70.0) and Emphysema (ICD10-J43.9). Coronary atherosclerosis. Small type 1 hiatal hernia. Airway thickening is present, suggesting bronchitis or reactive airways disease. Electronically Signed   By: Van Clines M.D.   On: 07/16/2021  08:45     ASSESSMENT:  1.  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 - No weight loss reported.  No chest pains. - MRI of the thoracic spine on 07/25/2020 showed incidental left upper lobe nodule measuring 2.3 x 1.5 x 1.7 cm. - CT of the chest with contrast on 07/31/2020 showed irregular nodule of the posteromedial left upper lobe abutting the pleura measuring 2.4 x 1.6 cm.  No pleural effusion or pneumothorax.  No adenopathy in the chest. - Denies any recent infections or COVID. - She has been on oxygen 24/7 for the last 10 years secondary to COPD. - SBRT to the left lung malignancy from 12/12/2020 - 12/19/2020, 50 Gray in 5 fractions.  2.  Social/family history: - She lives with her sister at home and is independent of ADLs and IADLs. - She has been on disability. - She quit smoking 10 years ago.  Smoked 2 to 3 pack/day for 30 years. - No family history of malignancies.   PLAN:  1.  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  Slowly progressive 0.7 x 0.5 cm nodule in the right lower lobe.  Atypical infectious bronchiolitis given somewhat branching/clustered appearance.  Other benign findings were discussed. - Reviewed labs from 07/14/2021 which showed normal LFTs, creatinine.  CBC was grossly normal with mild leukopenia and mild thrombocytopenia. - Recommend follow-up in 6 months with repeat CT scan of the chest with contrast. - We will also check G66, folic acid, ferritin and iron panel.   Orders placed this encounter:  No orders of the defined types were placed in this encounter.    Derek Jack, MD [place] [phone]   I, Thana Ates, am acting as a scribe for Dr. Derek Jack.  I, Derek Jack MD, have reviewed the above documentation for accuracy and  completeness, and I agree with the above.

## 2021-07-21 NOTE — Patient Instructions (Addendum)
[place] at [facility] Discharge Instructions  You were seen and examined today by Dr. Delton Coombes.  Dr. Delton Coombes discussed your most recent lab work and CT scan which revealed that you have a slight anemia and low platelets. Your CT scan showed that the cancer area that we have treated has decreased in size. You do have spot on the right lower lobe lung that we will watch with follow up CT scan.  Follow-up as scheduled in 6 months.    Thank you for choosing [place] at [facility] to provide your oncology and hematology care.  To afford each patient quality time with our provider, please arrive at least 15 minutes before your scheduled appointment time.   If you have a lab appointment with the [facility] please come in thru the Main Entrance and check in at the main information desk.  You need to re-schedule your appointment should you arrive 10 or more minutes late.  We strive to give you quality time with our providers, and arriving late affects you and other patients whose appointments are after yours.  Also, if you no show three or more times for appointments you may be dismissed from the clinic at the providers discretion.     Again, thank you for choosing [facility].  Our hope is that these requests will decrease the amount of time that you wait before being seen by our physicians.       _____________________________________________________________  Should you have questions after your visit to [facility], please contact our office at [phone] and follow the prompts.  Our office hours are 8:00 a.m. and 4:30 p.m. Monday - Friday.  Please note that voicemails left after 4:00 p.m. may not be returned until the following business day.  We are closed weekends and major holidays.  You do have access to a nurse 24-7, just call the main number to the clinic [phone] and do not press any options, hold on the  line and a nurse will answer the phone.    For prescription refill requests, have your pharmacy contact our office and allow 72 hours.    Due to Covid, you will need to wear a mask upon entering the hospital. If you do not have a mask, a mask will be given to you at the Main Entrance upon arrival. For doctor visits, patients may have 1 support person age 45 or older with them. For treatment visits, patients can not have anyone with them due to social distancing guidelines and our immunocompromised population.

## 2021-07-30 ENCOUNTER — Encounter: Payer: Self-pay | Admitting: Obstetrics & Gynecology

## 2021-07-30 ENCOUNTER — Ambulatory Visit (INDEPENDENT_AMBULATORY_CARE_PROVIDER_SITE_OTHER): Payer: Medicare PPO | Admitting: Obstetrics & Gynecology

## 2021-07-30 ENCOUNTER — Other Ambulatory Visit (HOSPITAL_COMMUNITY)
Admission: RE | Admit: 2021-07-30 | Discharge: 2021-07-30 | Disposition: A | Discharge status: Home / Self Care | Payer: Medicare PPO | Source: Ambulatory Visit | Attending: Obstetrics & Gynecology | Admitting: Obstetrics & Gynecology

## 2021-07-30 VITALS — BP 111/65 | HR 105 | Ht 63.0 in | Wt 106.2 lb

## 2021-07-30 DIAGNOSIS — Z01419 Encounter for gynecological examination (general) (routine) without abnormal findings: Secondary | ICD-10-CM | POA: Diagnosis not present

## 2021-07-30 DIAGNOSIS — Z113 Encounter for screening for infections with a predominantly sexual mode of transmission: Secondary | ICD-10-CM | POA: Insufficient documentation

## 2021-07-30 DIAGNOSIS — Z1151 Encounter for screening for human papillomavirus (HPV): Secondary | ICD-10-CM | POA: Diagnosis not present

## 2021-07-30 NOTE — Progress Notes (Signed)
   WELL-WOMAN EXAMINATION Patient name: Donna Watkins MRN [id number]  Date of birth: [birth year] Chief Complaint:   Annual Exam  History of Present Illness:   Donna Watkins is a 61 y.o. G2P0 PM female being seen today for a routine well-woman exam.  Today she notes no acute complaints.  Denies vaginal bleeding, discharge or irritation.  Sexually active with new partner, desires STI screening.   Patient's last menstrual period was 05/06/2010.  Last pap due today.  Last mammogram: 06/2021. Last colonoscopy: 2014      08/12/2020    2:40 PM  Depression screen PHQ 2/9  Decreased Interest 0  Down, Depressed, Hopeless 0  PHQ - 2 Score 0      Review of Systems:   Pertinent items are noted in HPI Denies any headaches, blurred vision, fatigue, shortness of breath, chest pain, abdominal pain, bowel movements, urination, or intercourse unless otherwise stated above.  Pertinent History Reviewed:  Reviewed past medical,surgical, social and family history.  Reviewed problem list, medications and allergies. Physical Assessment:   Vitals:   07/30/21 1423  BP: 111/65  Pulse: (!) 105  Weight: 106 lb 3.2 oz (48.2 kg)  Height: 5\' 3"  (1.6 m)  Body mass index is 18.81 kg/m.        Physical Examination:   General appearance - well appearing, and in no distress, pt using supplemental O2  Mental status - alert, oriented to person, place, and time  Psych:  She has a normal mood and affect  Skin - warm and dry, normal color, no suspicious lesions noted  Chest - normal respiratory effort  Heart - normal rate and regular rhythm  Neck:  midline trachea, no thyromegaly or nodules  Breasts - breasts appear normal, no suspicious masses, no skin or nipple changes or  axillary nodes  Abdomen - soft, nontender, nondistended, no masses or organomegaly  Pelvic - **SMALL SPECULUM USED VULVA: normal appearing vulva with no masses, tenderness or lesions  VAGINA: normal appearing vagina with  normal color and discharge, no lesions  CERVIX: normal appearing cervix without discharge or lesions, no CMT  Thin prep pap is done with HR HPV cotesting  UTERUS: uterus is felt to be normal size, shape, consistency and nontender   ADNEXA: No adnexal masses or tenderness noted.  Extremities:  No swelling or varicosities noted  Chaperone: Journalist, newspaper & Plan:  1) Well-Woman Exam -pap collected reviewed ASCCP guidelines -colonoscopy and mammogram up to date  2) STi screening to be completed  Orders Placed This Encounter  Procedures   RPR   HIV Antibody (routine testing w rflx)    Meds: No orders of the defined types were placed in this encounter.   Follow-up: Return in about 1 year (around 07/31/2022) for Annual.   Janyth Pupa, DO Attending [place], [place] for [facility] [place]

## 2021-07-31 LAB — HIV ANTIBODY (ROUTINE TESTING W REFLEX): HIV Screen 4th Generation wRfx: NONREACTIVE

## 2021-07-31 LAB — RPR: RPR Ser Ql: NONREACTIVE

## 2021-08-04 LAB — CYTOLOGY - PAP
Chlamydia: NEGATIVE
Comment: NEGATIVE
Comment: NEGATIVE
Comment: NEGATIVE
Comment: NORMAL
Diagnosis: NEGATIVE
High risk HPV: NEGATIVE
Neisseria Gonorrhea: NEGATIVE
Trichomonas: NEGATIVE

## 2021-08-18 ENCOUNTER — Ambulatory Visit (INDEPENDENT_AMBULATORY_CARE_PROVIDER_SITE_OTHER): Payer: Medicare HMO | Admitting: Internal Medicine

## 2021-08-18 ENCOUNTER — Encounter: Payer: Self-pay | Admitting: Internal Medicine

## 2021-08-18 DIAGNOSIS — J9612 Chronic respiratory failure with hypercapnia: Secondary | ICD-10-CM | POA: Diagnosis not present

## 2021-08-18 DIAGNOSIS — J449 Chronic obstructive pulmonary disease, unspecified: Secondary | ICD-10-CM | POA: Diagnosis not present

## 2021-08-18 DIAGNOSIS — J9611 Chronic respiratory failure with hypoxia: Secondary | ICD-10-CM | POA: Diagnosis not present

## 2021-08-18 NOTE — Assessment & Plan Note (Signed)
07/31/2016  Patient Saturations on Room Air at Rest = 88% and while Ambulating = 83% - HC03  07/31/2016  = 38  - 07/31/2016   Walked 3lpm  2 laps @ 185 ft each stopped due to  Sob at moderate pace with no desat  - RA  = 85% 01/03/2018 corrected to 92 on 2lpm continuous  -  05/26/2019   Walked 2lpm cont x  approx   200 ft  @ moderate pace  stopped due to  Sob with sats still 91%   -  08/28/2019   Walked 2.5 lpm  approx   400 ft  @ moderate pace  stopped due to  Sob with sats still   90% - 02 sats at rst 85% RA 02/27/2020  - 08/14/2020 referred to advance for best fit eval   As of 08/18/2021 using 3lpm hs and daytime but only 2lpm with amb 02 "to save supply"  - reviewed: Make sure you check your oxygen saturation  AT  your highest level of activity (not after you stop)   to be sure it stays over 90% and adjust  02 flow upward to maintain this level if needed but remember to turn it back to previous settings when you stop (to conserve your supply).          Each maintenance medication was reviewed in detail including emphasizing most importantly the difference between maintenance and prns and under what circumstances the prns are to be triggered using an action plan format where appropriate.  Total time for H and P, chart review, counseling, reviewing neb/02 device(s) and generating customized AVS unique to this office visit / same day charting =  31 min

## 2021-08-18 NOTE — Patient Instructions (Signed)
No change in medications needed   Please schedule a follow up visit in 6 months but call sooner if needed

## 2021-08-18 NOTE — Assessment & Plan Note (Signed)
Quit smoking 2013/MM - PFT's  04/27/12  FEV1 0.70 (26 %) ratio 35  p no % improvement from saba p ? prior to study with DLCO  53 % corrects to 69 % for alv volume with classic curvature   - Alpha one AT screening   07/31/16 >   MM - PFT's  12/31/2016  FEV1 0.74 (28 % ) ratio 38  p no % improvement from saba p saba 4h   prior to study with DLCO  37/38c % corrects to 50 % for alv volume   - 07/02/2017   try bevespi: did not use correctly  - 01/03/2018    try bevespi again > could not afford and could not tell really helped vs duoneb - 05/26/2019  After extensive coaching inhaler device,  effectiveness =    90% with smi > try stiolto> could not afford it on her plan >   - as of 08/14/2020 maint on duoneb qid   Doing surprisingly well on duoneb just bid most of the time so no need to change rx with contingencies to increase to qid and prn saba if needed with f/u q 6 m, sooner if needed.

## 2021-08-18 NOTE — Progress Notes (Signed)
Subjective:   Patient ID: Donna Watkins, female    DOB: 07-11-1960     MRN: [id number]    Brief patient profile:  36   yowm MM/quit smoking in 2013 with severe copd at baseline last seen in pulmonary clinic  by [facility] 11/2013  GOLD IV criteria with PFTs 05/2012 ith fev1 only 26%, diffusion at 52%    History of Present Illness  07/31/2016 1st office visit/ Kevan Prouty   GOLD IV copd/ 02 2lpm 24/7  Chief Complaint  Patient presents with   Pulmonary Consult    Pt is self referred for COPD. Pt c/o DOE, throat clearing. Pt denies CP/tightness anf recentl f/c/s. Pt states she rarely uses albuterol hfa.   doe -  turns up 3lpm walking but still doe x 50 ft Use scooter at [employer] x 5 years  Sleeps on 4lpm / no am ha / cough or congestion  Uses neb avg bid, very poor  insight into meds/02  Some better breathing p neb saba rec  Call us if you want to be referred to [facility] for pulmonary rehab - in meantime continue 2lpm at rest and 3lpm with activity    Please schedule a follow up visit in 3 months but call sooner if needed with full pfts on return      12/31/2016  f/u ov/Shera Laubach re:  2lpm 24/7  Chief Complaint  Patient presents with   Follow-up    review PFT results. States breathing has been ok since last visit.    breathing ok at rest but can't walk 300 ft even on up to 3lpm s sob on flat surface slow pace  rec Stay on alb/ipatropium up to 4 x daily    07/02/2017  f/u ov/Kayla Weekes re: GOLD IV  / 02 24/7  Chief Complaint  Patient presents with   Follow-up    Breathing is overall doing well overall. She is using her albuterol inhaler once per wk on average. She uses her albuterol and atrovent 2 x daily on average. She states she has been losing wt unintentionally.    Dyspnea:  Not usually walking more than a few hundred feet on 3lpm / still using scooter at [employer]/ [employer] Cough: not now Sleeping: insomnia no resp complaints  SABA use: about twice dialy duoneb  02: 2lpm x  3lpm walking   rec Plan A = Automatic = Bevespi Take 2 puffs first thing in am and then another 2 puffs about 12 hours later.  Plan B = Backup Only use your albuterol as a rescue medication Plan C = Crisis - only use your albuterol (no ipatropium)  nebulizer if you first try Plan B and it fails to help > ok to use the nebulizer up to every 4 hours but if start needing it regularly call for immediate appointment        07/04/2018  f/u ov/Tahra Hitzeman re:  GOLD IV  / 02 24/7  3lpm at home then 2lpm cont out/ duoneb only  Chief Complaint  Patient presents with   Follow-up    Breathing is about the same. No new co's. She rarely uses her albuterol inhaler but uses her neb 2 x daily.    Dyspnea:  Always rides scooter / room to room at home on 3lpm  Cough: no Sleeping: 2 pillows/ bed flat  SABA use: duoneb bid / can't afford bevespi and did not note change on vs off it 02: as above  No flares of copd since prior  rec Duoneb (combination of ipatropium and albuterol) is up to 4 x daily  Only use your albuterol as a rescue medication      02/27/2020  f/u ov/[place] office/Nohely Whitehorn re: GOLD IV / o2 dep / never took pepcid and rash went away while still on omeprazole  Chief Complaint  Patient presents with   Follow-up    Needs o2 recert for [employer]. Breathing has been doing well. She is using her albuterol/atrovent nebs together 3-4 x per day and albuterol inhaler once per wk on average.    Dyspnea:  Still walking to MB x 100 ft flat grade / slower pace than nl = MMRC3 = can't walk 100 yards even at a slow pace at a flat grade s stopping due to sob / now walking instead of riding  @ [employer] but still can't do wm Cough: none  Sleeping: flat bed/ 2 pillow no noctornal resp symptoms  SABA use: using as maint rx but only tid 02: 3lpm hs and 2-3 lpm rest of the time  Covid status: moderna x 2 06/01/19  Lung cancer screening: n/a  Rec  I very strongly recommend you get the third  moderna vaccine  No  change medications    08/14/2020  f/u ov/[place] office/Chanele Douglas re: GOLD IV COPD/ 02 dep/ maint on duoneb as can't afford bevespi   Chief Complaint  Patient presents with   Follow-up    Breathing is unchanged. She has minimal dry cough. She states having PET scan soon to eval nodules found on CT done to eval back pain.   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    Dyspnea:  no longer doing mb  but is shopping ok at [employer]  Cough: none  Sleeping: no resp cc  SABA use: maybe once a day  02: 3lpm hs and daytime does not titrate  Covid status: vax x 4  Dysphagia x one year preceded RT and already on ppi s overt hb Rec Make sure you check your oxygen saturation  AT  your highest level of activity (not after you stop)   to be sure it stays over 90%   Have Dr Karie Kirks refer you to a new GI doctor since Dr Oneida Alar has moved     08/18/2021  f/u ov/[place] office/Moyinoluwa Dawe re: gold iv/ 02 Dep maint on duoneb bid and prn   Chief Complaint  Patient presents with   Follow-up    Heat bothers patients breathing but overall doing well.   Dyspnea:  still shopping [employer]  Cough: none  Sleeping: able to lie  down flat/level and 2 pillows  SABA use: rarely needing extra unless over does it 02: 3lpm hs  and during the day  Mild  dysphagia with small HH on CT chest 07/14/21   No obvious day to day or daytime variability or assoc excess/ purulent sputum or mucus plugs or hemoptysis or cp or chest tightness, subjective wheeze or overt sinus or hb symptoms.   Sleeping as above  without nocturnal  or early am exacerbation  of respiratory  c/o's or need for noct saba. Also denies any obvious fluctuation of symptoms with weather or environmental changes or other aggravating or alleviating factors except as outlined above   No unusual exposure hx or h/o childhood pna/ asthma or knowledge of premature birth.  Current Allergies, Complete Past Medical History, Past Surgical History, Family History, and Social History were reviewed in [employer] record.  ROS  The following are not active complaints unless bolded Hoarseness, sore throat, dysphagia, dental problems, itching, sneezing,  nasal congestion or discharge of excess mucus or purulent secretions, ear ache,   fever, chills, sweats, unintended wt loss or wt gain, classically pleuritic or exertional cp,  orthopnea pnd or arm/hand swelling  or leg swelling, presyncope, palpitations, abdominal pain, anorexia, nausea, vomiting, diarrhea  or change in bowel habits or change in bladder habits, change in stools or change in urine, dysuria, hematuria,  rash, arthralgias, visual complaints, headache, numbness, weakness or ataxia or problems with walking or coordination,  change in mood or  memory.        Current Meds  Medication Sig   albuterol (PROVENTIL) (2.5 MG/3ML) 0.083% nebulizer solution Take 3 mLs (2.5 mg total) by nebulization in the morning, at noon, in the evening, and at bedtime. Mixes with Ipratropium solution   albuterol (VENTOLIN HFA) 108 (90 Base) MCG/ACT inhaler Inhale 2 puffs into the lungs every 6 (six) hours as needed for wheezing or shortness of breath.   ALPRAZolam (XANAX) 1 MG tablet Take 1 mg by mouth 5 (five) times daily as needed.    atorvastatin (LIPITOR) 20 MG tablet Take 20 mg by mouth daily.   HYDROcodone-acetaminophen (NORCO/VICODIN) 5-325 MG tablet SMARTSIG:0.5-1 Tablet(s) By Mouth 1-3 Times Daily   ibuprofen (ADVIL) 200 MG tablet Take 200 mg by mouth every 6 (six) hours as needed for mild pain.   ipratropium (ATROVENT) 0.02 % nebulizer solution Take 0.5 mg by nebulization 4 (four) times daily. Mixes with Albuterol solution   Multiple Vitamins-Minerals (CENTRUM SILVER PO) Take 1 tablet by mouth daily.   nystatin cream (MYCOSTATIN) Apply 1 application  topically 2 (two) times daily as needed (rash).   omeprazole (PRILOSEC) 20 MG capsule Take 20 mg by mouth daily.   OXYGEN Place 2 L into the nose continuous.   traZODone (DESYREL) 50 MG tablet Take 50 mg by mouth at bedtime.            Objective:   Physical Exam  Wt  08/18/2021         112  02/19/2021         114  08/14/2020         120  02/27/2020         116 08/28/2019         107  05/26/2019       106  07/04/2018        129 01/03/2018      126  07/02/2017        128 12/31/2016  136   07/31/16 145 lb 9.6 oz (66 kg)  01/22/14 180 lb (81.6 kg)  05/23/13 180 lb 12.8 oz (82 kg)      01/03/18 126 lb (57.2 kg)  07/02/17 128 lb 9.6 oz (58.3 kg)  02/04/17 130 lb (59 kg)    Vital signs reviewed  08/18/2021  - Note at rest 02 sats  89% on 2lpm    General appearance:    thin amb wf nad   HEENT :  Oropharynx  clear   Nasal turbinates nl    NECK :  without JVD/Nodes/TM/ nl carotid upstrokes bilaterally   LUNGS: no acc muscle use,  Mod barrel  contour chest wall with bilateral  Distant bs s audible wheeze and  without cough on insp or exp maneuvers and mod  Hyperresonant  to  percussion bilaterally     CV:  RRR  no s3 or murmur or increase in P2, and no edema   ABD:  soft and nontender with pos mid insp Hoover's  in the supine position. No bruits or organomegaly appreciated, bowel sounds nl  MS:   Ext warm without deformities or   obvious joint  restrictions , calf tenderness, cyanosis   -   Mod clubbing.  SKIN: warm and dry without lesions    NEURO:  alert, approp, nl sensorium with  no motor or cerebellar deficits apparent.              Assessment & Plan:

## 2021-09-24 ENCOUNTER — Other Ambulatory Visit (HOSPITAL_COMMUNITY): Payer: Self-pay | Admitting: Family Medicine

## 2021-09-24 DIAGNOSIS — N6489 Other specified disorders of breast: Secondary | ICD-10-CM

## 2021-11-25 ENCOUNTER — Emergency Department (HOSPITAL_COMMUNITY): Payer: Medicare HMO

## 2021-11-25 ENCOUNTER — Observation Stay (HOSPITAL_COMMUNITY)
Admission: EM | Admit: 2021-11-25 | Discharge: 2021-11-27 | Disposition: A | Discharge status: Home Health | Payer: Medicare HMO | Attending: Family Medicine | Admitting: Family Medicine

## 2021-11-25 ENCOUNTER — Other Ambulatory Visit: Payer: Self-pay

## 2021-11-25 ENCOUNTER — Encounter (HOSPITAL_COMMUNITY): Payer: Self-pay | Admitting: Emergency Medicine

## 2021-11-25 DIAGNOSIS — E43 Unspecified severe protein-calorie malnutrition: Secondary | ICD-10-CM | POA: Diagnosis not present

## 2021-11-25 DIAGNOSIS — F419 Anxiety disorder, unspecified: Secondary | ICD-10-CM | POA: Insufficient documentation

## 2021-11-25 DIAGNOSIS — C3490 Malignant neoplasm of unspecified part of unspecified bronchus or lung: Secondary | ICD-10-CM | POA: Diagnosis not present

## 2021-11-25 DIAGNOSIS — J441 Chronic obstructive pulmonary disease with (acute) exacerbation: Secondary | ICD-10-CM | POA: Diagnosis not present

## 2021-11-25 DIAGNOSIS — R55 Syncope and collapse: Secondary | ICD-10-CM | POA: Insufficient documentation

## 2021-11-25 DIAGNOSIS — C3492 Malignant neoplasm of unspecified part of left bronchus or lung: Secondary | ICD-10-CM | POA: Insufficient documentation

## 2021-11-25 DIAGNOSIS — R739 Hyperglycemia, unspecified: Secondary | ICD-10-CM | POA: Insufficient documentation

## 2021-11-25 DIAGNOSIS — J9621 Acute and chronic respiratory failure with hypoxia: Secondary | ICD-10-CM | POA: Diagnosis not present

## 2021-11-25 DIAGNOSIS — D696 Thrombocytopenia, unspecified: Secondary | ICD-10-CM | POA: Insufficient documentation

## 2021-11-25 DIAGNOSIS — R410 Disorientation, unspecified: Secondary | ICD-10-CM | POA: Diagnosis not present

## 2021-11-25 DIAGNOSIS — G4700 Insomnia, unspecified: Secondary | ICD-10-CM | POA: Diagnosis not present

## 2021-11-25 DIAGNOSIS — R911 Solitary pulmonary nodule: Secondary | ICD-10-CM | POA: Insufficient documentation

## 2021-11-25 DIAGNOSIS — R0902 Hypoxemia: Secondary | ICD-10-CM

## 2021-11-25 DIAGNOSIS — J449 Chronic obstructive pulmonary disease, unspecified: Secondary | ICD-10-CM

## 2021-11-25 DIAGNOSIS — K219 Gastro-esophageal reflux disease without esophagitis: Secondary | ICD-10-CM | POA: Insufficient documentation

## 2021-11-25 DIAGNOSIS — R718 Other abnormality of red blood cells: Secondary | ICD-10-CM | POA: Insufficient documentation

## 2021-11-25 DIAGNOSIS — W19XXXA Unspecified fall, initial encounter: Secondary | ICD-10-CM

## 2021-11-25 DIAGNOSIS — R627 Adult failure to thrive: Secondary | ICD-10-CM | POA: Diagnosis not present

## 2021-11-25 DIAGNOSIS — Z79899 Other long term (current) drug therapy: Secondary | ICD-10-CM | POA: Diagnosis not present

## 2021-11-25 DIAGNOSIS — J9622 Acute and chronic respiratory failure with hypercapnia: Secondary | ICD-10-CM | POA: Insufficient documentation

## 2021-11-25 DIAGNOSIS — F411 Generalized anxiety disorder: Secondary | ICD-10-CM | POA: Insufficient documentation

## 2021-11-25 DIAGNOSIS — Z87891 Personal history of nicotine dependence: Secondary | ICD-10-CM | POA: Insufficient documentation

## 2021-11-25 DIAGNOSIS — E782 Mixed hyperlipidemia: Secondary | ICD-10-CM | POA: Insufficient documentation

## 2021-11-25 DIAGNOSIS — R636 Underweight: Secondary | ICD-10-CM | POA: Insufficient documentation

## 2021-11-25 DIAGNOSIS — E440 Moderate protein-calorie malnutrition: Secondary | ICD-10-CM | POA: Insufficient documentation

## 2021-11-25 DIAGNOSIS — B370 Candidal stomatitis: Secondary | ICD-10-CM | POA: Insufficient documentation

## 2021-11-25 DIAGNOSIS — R4182 Altered mental status, unspecified: Secondary | ICD-10-CM | POA: Diagnosis present

## 2021-11-25 HISTORY — DX: Malignant (primary) neoplasm, unspecified: C80.1

## 2021-11-25 LAB — CBC
HCT: 51.2 % — ABNORMAL HIGH (ref 36.0–46.0)
Hemoglobin: 14.6 g/dL (ref 12.0–15.0)
MCH: 31.1 pg (ref 26.0–34.0)
MCHC: 28.5 g/dL — ABNORMAL LOW (ref 30.0–36.0)
MCV: 108.9 fL — ABNORMAL HIGH (ref 80.0–100.0)
Platelets: 94 10*3/uL — ABNORMAL LOW (ref 150–400)
RBC: 4.7 MIL/uL (ref 3.87–5.11)
RDW: 11.8 % (ref 11.5–15.5)
WBC: 4.9 10*3/uL (ref 4.0–10.5)
nRBC: 0 % (ref 0.0–0.2)

## 2021-11-25 LAB — COMPREHENSIVE METABOLIC PANEL
ALT: 22 U/L (ref 0–44)
AST: 32 U/L (ref 15–41)
Albumin: 4.9 g/dL (ref 3.5–5.0)
Alkaline Phosphatase: 71 U/L (ref 38–126)
BUN: 42 mg/dL — ABNORMAL HIGH (ref 6–20)
CO2: >45 mmol/L — ABNORMAL HIGH (ref 22–32)
Calcium: 9.7 mg/dL (ref 8.9–10.3)
Chloride: 81 mmol/L — ABNORMAL LOW (ref 98–111)
Creatinine, Ser: 0.52 mg/dL (ref 0.44–1.00)
GFR, Estimated: >60 mL/min (ref >60)
Glucose, Bld: 191 mg/dL — ABNORMAL HIGH (ref 70–99)
Potassium: 4 mmol/L (ref 3.5–5.1)
Sodium: 145 mmol/L (ref 135–145)
Total Bilirubin: 1.1 mg/dL (ref 0.3–1.2)
Total Protein: 8.5 g/dL — ABNORMAL HIGH (ref 6.5–8.1)

## 2021-11-25 LAB — CBG MONITORING, ED: Glucose-Capillary: 171 mg/dL — ABNORMAL HIGH (ref 70–99)

## 2021-11-25 NOTE — ED Provider Notes (Incomplete)
[facility] EMERGENCY DEPARTMENT Provider Note   CSN: [id number] Arrival date & time: 11/25/21  1947     History {Add pertinent medical, surgical, social history, OB history to HPI:1} Chief Complaint  Patient presents with   Altered Mental Status    Donna Watkins is a 61 y.o. female.  HPI     Home Medications Prior to Admission medications   Medication Sig Start Date End Date Taking? Authorizing Provider  albuterol (PROVENTIL) (2.5 MG/3ML) 0.083% nebulizer solution Take 3 mLs (2.5 mg total) by nebulization in the morning, at noon, in the evening, and at bedtime. Mixes with Ipratropium solution 11/04/20   Byrum, Rose Fillers, MD  albuterol (VENTOLIN HFA) 108 (90 Base) MCG/ACT inhaler Inhale 2 puffs into the lungs every 6 (six) hours as needed for wheezing or shortness of breath. 11/04/20   Collene Gobble, MD  ALPRAZolam Duanne Moron) 1 MG tablet Take 1 mg by mouth 5 (five) times daily as needed. 11/04/20   [provider]  atorvastatin (LIPITOR) 20 MG tablet Take 20 mg by mouth daily. 05/06/20   [provider]  HYDROcodone-acetaminophen (NORCO/VICODIN) 5-325 MG tablet SMARTSIG:0.5-1 Tablet(s) By Mouth 1-3 Times Daily 08/06/21   [provider]  ibuprofen (ADVIL) 200 MG tablet Take 200 mg by mouth every 6 (six) hours as needed for mild pain.    [provider]  ipratropium (ATROVENT) 0.02 % nebulizer solution Take 0.5 mg by nebulization 4 (four) times daily. Mixes with Albuterol solution    [provider]  Multiple Vitamins-Minerals (CENTRUM SILVER PO) Take 1 tablet by mouth daily.    [provider]  nystatin cream (MYCOSTATIN) Apply 1 application  topically 2 (two) times daily as needed (rash). 07/01/20   [provider]  omeprazole (PRILOSEC) 20 MG capsule Take 20 mg by mouth daily.    [provider]  OXYGEN Place 2 L into the nose continuous.    [provider]  traZODone (DESYREL) 50 MG tablet Take 50 mg  by mouth at bedtime. 06/25/17   [provider]      Allergies    Patient has no known allergies.    Review of Systems   Review of Systems  Physical Exam Updated Vital Signs BP 117/70   Pulse (!) 104   Temp (!) 97.1 F (36.2 C) (Rectal)   Resp (!) 27   Ht 1.6 m (5\' 3" )   Wt 48 kg   LMP 05/06/2010   SpO2 90%   BMI 18.75 kg/m  Physical Exam  ED Results / Procedures / Treatments   Labs (all labs ordered are listed, but only abnormal results are displayed) Labs Reviewed  COMPREHENSIVE METABOLIC PANEL - Abnormal; Notable for the following components:      Result Value   Chloride 81 (*)    CO2 >45 (*)    Glucose, Bld 191 (*)    BUN 42 (*)    Total Protein 8.5 (*)    All other components within normal limits  CBC - Abnormal; Notable for the following components:   HCT 51.2 (*)    MCV 108.9 (*)    MCHC 28.5 (*)    Platelets 94 (*)    All other components within normal limits  CBG MONITORING, ED - Abnormal; Notable for the following components:   Glucose-Capillary 171 (*)    All other components within normal limits    EKG None  Radiology No results found.  Procedures Procedures  {Document cardiac monitor, telemetry assessment  procedure when appropriate:1}  Medications Ordered in ED Medications - No data to display  ED Course/ Medical Decision Making/ A&P                           Medical Decision Making Amount and/or Complexity of Data Reviewed Labs: ordered.   ***  {Document critical care time when appropriate:1} {Document review of labs and clinical decision tools ie heart score, Chads2Vasc2 etc:1}  {Document your independent review of radiology images, and any outside records:1} {Document your discussion with family members, caretakers, and with consultants:1} {Document social determinants of health affecting pt's care:1} {Document your decision making why or why not admission, treatments were needed:1} Final Clinical Impression(s) / ED  Diagnoses Final diagnoses:  None    Rx / DC Orders ED Discharge Orders     None

## 2021-11-25 NOTE — ED Notes (Signed)
The pt was bladder scanned at this time 0 in bladder pt was incontinent on arrival so female purewick was placed. Donna Watkins

## 2021-11-25 NOTE — ED Triage Notes (Signed)
Pt found in floor at home without oxygen on. Pt is altered and has been x 2 days per sister.

## 2021-11-26 ENCOUNTER — Other Ambulatory Visit (HOSPITAL_COMMUNITY): Payer: Self-pay | Admitting: *Deleted

## 2021-11-26 ENCOUNTER — Emergency Department (HOSPITAL_COMMUNITY): Payer: Medicare HMO

## 2021-11-26 ENCOUNTER — Observation Stay (HOSPITAL_COMMUNITY): Payer: Medicare HMO

## 2021-11-26 ENCOUNTER — Observation Stay (HOSPITAL_BASED_OUTPATIENT_CLINIC_OR_DEPARTMENT_OTHER): Payer: Medicare HMO

## 2021-11-26 ENCOUNTER — Encounter (HOSPITAL_COMMUNITY): Payer: Self-pay | Admitting: Internal Medicine

## 2021-11-26 DIAGNOSIS — R911 Solitary pulmonary nodule: Secondary | ICD-10-CM | POA: Insufficient documentation

## 2021-11-26 DIAGNOSIS — E782 Mixed hyperlipidemia: Secondary | ICD-10-CM | POA: Insufficient documentation

## 2021-11-26 DIAGNOSIS — E440 Moderate protein-calorie malnutrition: Secondary | ICD-10-CM | POA: Insufficient documentation

## 2021-11-26 DIAGNOSIS — J441 Chronic obstructive pulmonary disease with (acute) exacerbation: Principal | ICD-10-CM

## 2021-11-26 DIAGNOSIS — W19XXXA Unspecified fall, initial encounter: Secondary | ICD-10-CM | POA: Diagnosis not present

## 2021-11-26 DIAGNOSIS — J9621 Acute and chronic respiratory failure with hypoxia: Secondary | ICD-10-CM | POA: Insufficient documentation

## 2021-11-26 DIAGNOSIS — G4700 Insomnia, unspecified: Secondary | ICD-10-CM | POA: Insufficient documentation

## 2021-11-26 DIAGNOSIS — R55 Syncope and collapse: Secondary | ICD-10-CM

## 2021-11-26 DIAGNOSIS — D696 Thrombocytopenia, unspecified: Secondary | ICD-10-CM | POA: Insufficient documentation

## 2021-11-26 DIAGNOSIS — R627 Adult failure to thrive: Secondary | ICD-10-CM | POA: Insufficient documentation

## 2021-11-26 DIAGNOSIS — R739 Hyperglycemia, unspecified: Secondary | ICD-10-CM | POA: Insufficient documentation

## 2021-11-26 DIAGNOSIS — E43 Unspecified severe protein-calorie malnutrition: Secondary | ICD-10-CM | POA: Insufficient documentation

## 2021-11-26 DIAGNOSIS — Y92009 Unspecified place in unspecified non-institutional (private) residence as the place of occurrence of the external cause: Secondary | ICD-10-CM

## 2021-11-26 DIAGNOSIS — C3492 Malignant neoplasm of unspecified part of left bronchus or lung: Secondary | ICD-10-CM | POA: Insufficient documentation

## 2021-11-26 DIAGNOSIS — R636 Underweight: Secondary | ICD-10-CM | POA: Insufficient documentation

## 2021-11-26 DIAGNOSIS — C3490 Malignant neoplasm of unspecified part of unspecified bronchus or lung: Secondary | ICD-10-CM

## 2021-11-26 DIAGNOSIS — K219 Gastro-esophageal reflux disease without esophagitis: Secondary | ICD-10-CM | POA: Insufficient documentation

## 2021-11-26 DIAGNOSIS — J9622 Acute and chronic respiratory failure with hypercapnia: Secondary | ICD-10-CM

## 2021-11-26 DIAGNOSIS — B370 Candidal stomatitis: Secondary | ICD-10-CM | POA: Insufficient documentation

## 2021-11-26 DIAGNOSIS — R718 Other abnormality of red blood cells: Secondary | ICD-10-CM | POA: Insufficient documentation

## 2021-11-26 DIAGNOSIS — F411 Generalized anxiety disorder: Secondary | ICD-10-CM | POA: Insufficient documentation

## 2021-11-26 DIAGNOSIS — F419 Anxiety disorder, unspecified: Secondary | ICD-10-CM | POA: Insufficient documentation

## 2021-11-26 LAB — CBC
HCT: 44 % (ref 36.0–46.0)
Hemoglobin: 12.8 g/dL (ref 12.0–15.0)
MCH: 31.3 pg (ref 26.0–34.0)
MCHC: 29.1 g/dL — ABNORMAL LOW (ref 30.0–36.0)
MCV: 107.6 fL — ABNORMAL HIGH (ref 80.0–100.0)
Platelets: 92 10*3/uL — ABNORMAL LOW (ref 150–400)
RBC: 4.09 MIL/uL (ref 3.87–5.11)
RDW: 11.8 % (ref 11.5–15.5)
WBC: 6.7 10*3/uL (ref 4.0–10.5)
nRBC: 0 % (ref 0.0–0.2)

## 2021-11-26 LAB — COMPREHENSIVE METABOLIC PANEL
ALT: 19 U/L (ref 0–44)
AST: 22 U/L (ref 15–41)
Albumin: 4.1 g/dL (ref 3.5–5.0)
Alkaline Phosphatase: 54 U/L (ref 38–126)
BUN: 38 mg/dL — ABNORMAL HIGH (ref 6–20)
CO2: >45 mmol/L — ABNORMAL HIGH (ref 22–32)
Calcium: 9.1 mg/dL (ref 8.9–10.3)
Chloride: 80 mmol/L — ABNORMAL LOW (ref 98–111)
Creatinine, Ser: 0.47 mg/dL (ref 0.44–1.00)
GFR, Estimated: >60 mL/min (ref >60)
Glucose, Bld: 101 mg/dL — ABNORMAL HIGH (ref 70–99)
Potassium: 4.6 mmol/L (ref 3.5–5.1)
Sodium: 143 mmol/L (ref 135–145)
Total Bilirubin: 1 mg/dL (ref 0.3–1.2)
Total Protein: 6.8 g/dL (ref 6.5–8.1)

## 2021-11-26 LAB — BRAIN NATRIURETIC PEPTIDE: B Natriuretic Peptide: 59 pg/mL (ref 0.0–100.0)

## 2021-11-26 LAB — BLOOD GAS, VENOUS
Acid-Base Excess: 40 mmol/L — ABNORMAL HIGH (ref 0.0–2.0)
Bicarbonate: 75.6 mmol/L — ABNORMAL HIGH (ref 20.0–28.0)
Drawn by: 4252
O2 Saturation: 31.2 %
Patient temperature: 36.2
pCO2, Ven: >123 mmHg (ref 44–60)
pH, Ven: 7.36 (ref 7.25–7.43)
pO2, Ven: <31 mmHg — CL (ref 32–45)

## 2021-11-26 LAB — ECHOCARDIOGRAM COMPLETE
Area-P 1/2: 2.07 cm2
Height: 63 in
P 1/2 time: 226 msec
S' Lateral: 2.4 cm
Weight: 1506.18 oz

## 2021-11-26 LAB — MAGNESIUM: Magnesium: 1.8 mg/dL (ref 1.7–2.4)

## 2021-11-26 LAB — TROPONIN I (HIGH SENSITIVITY)
Troponin I (High Sensitivity): 17 ng/L (ref <18)
Troponin I (High Sensitivity): 14 ng/L (ref <18)

## 2021-11-26 LAB — APTT: aPTT: 23 seconds — ABNORMAL LOW (ref 24–36)

## 2021-11-26 LAB — MRSA NEXT GEN BY PCR, NASAL: MRSA by PCR Next Gen: NOT DETECTED

## 2021-11-26 LAB — FOLATE: Folate: 5.4 ng/mL — ABNORMAL LOW (ref >5.9)

## 2021-11-26 LAB — PHOSPHORUS: Phosphorus: 3 mg/dL (ref 2.5–4.6)

## 2021-11-26 LAB — VITAMIN B12: Vitamin B-12: 262 pg/mL (ref 180–914)

## 2021-11-26 MED ORDER — METHYLPREDNISOLONE SODIUM SUCC 40 MG IJ SOLR
40.0000 mg | Freq: Two times a day (BID) | INTRAMUSCULAR | Status: DC
  Administered 2021-11-26 – 2021-11-27 (×2): 40 mg via INTRAVENOUS
  Filled 2021-11-26 (×2): qty 1

## 2021-11-26 MED ORDER — MAGNESIUM SULFATE 2 GM/50ML IV SOLN
2.0000 g | Freq: Once | INTRAVENOUS | Status: AC
  Administered 2021-11-26: 2 g via INTRAVENOUS
  Filled 2021-11-26: qty 50

## 2021-11-26 MED ORDER — ENSURE ENLIVE PO LIQD
237.0000 mL | Freq: Three times a day (TID) | ORAL | Status: DC
  Administered 2021-11-26 – 2021-11-27 (×2): 237 mL via ORAL

## 2021-11-26 MED ORDER — IPRATROPIUM-ALBUTEROL 0.5-2.5 (3) MG/3ML IN SOLN
3.0000 mL | Freq: Three times a day (TID) | RESPIRATORY_TRACT | Status: DC
  Administered 2021-11-27 (×2): 3 mL via RESPIRATORY_TRACT
  Filled 2021-11-26 (×2): qty 3

## 2021-11-26 MED ORDER — ADULT MULTIVITAMIN W/MINERALS CH
1.0000 | ORAL_TABLET | Freq: Every day | ORAL | Status: DC
  Administered 2021-11-27: 1 via ORAL
  Filled 2021-11-26: qty 1

## 2021-11-26 MED ORDER — IPRATROPIUM-ALBUTEROL 0.5-2.5 (3) MG/3ML IN SOLN: 3.0000 mL | RESPIRATORY_TRACT | Status: DC | PRN

## 2021-11-26 MED ORDER — IOHEXOL 350 MG/ML SOLN
100.0000 mL | Freq: Once | INTRAVENOUS | Status: AC | PRN
  Administered 2021-11-26: 100 mL via INTRAVENOUS

## 2021-11-26 MED ORDER — IPRATROPIUM-ALBUTEROL 0.5-2.5 (3) MG/3ML IN SOLN
3.0000 mL | Freq: Once | RESPIRATORY_TRACT | Status: AC
  Administered 2021-11-26: 3 mL via RESPIRATORY_TRACT
  Filled 2021-11-26: qty 3

## 2021-11-26 MED ORDER — ADULT MULTIVITAMIN W/MINERALS CH: 1.0000 | ORAL_TABLET | Freq: Every day | ORAL | Status: DC

## 2021-11-26 MED ORDER — CHLORHEXIDINE GLUCONATE CLOTH 2 % EX PADS
6.0000 | MEDICATED_PAD | Freq: Every day | CUTANEOUS | Status: DC
  Administered 2021-11-26 – 2021-11-27 (×2): 6 via TOPICAL

## 2021-11-26 MED ORDER — DM-GUAIFENESIN ER 30-600 MG PO TB12: 1.0000 | ORAL_TABLET | Freq: Two times a day (BID) | ORAL | Status: DC

## 2021-11-26 MED ORDER — GLUCERNA SHAKE PO LIQD
237.0000 mL | Freq: Three times a day (TID) | ORAL | Status: DC
  Administered 2021-11-26 (×2): 237 mL via ORAL

## 2021-11-26 MED ORDER — ALPRAZOLAM 0.5 MG PO TABS
1.0000 mg | ORAL_TABLET | Freq: Three times a day (TID) | ORAL | Status: DC | PRN
  Administered 2021-11-26 – 2021-11-27 (×3): 1 mg via ORAL
  Filled 2021-11-26 (×3): qty 2

## 2021-11-26 MED ORDER — METHYLPREDNISOLONE SODIUM SUCC 125 MG IJ SOLR
125.0000 mg | Freq: Once | INTRAMUSCULAR | Status: AC
  Administered 2021-11-26: 125 mg via INTRAVENOUS
  Filled 2021-11-26: qty 2

## 2021-11-26 MED ORDER — FLUCONAZOLE 100MG IVPB
100.0000 mg | INTRAVENOUS | Status: DC
  Administered 2021-11-26 – 2021-11-27 (×2): 100 mg via INTRAVENOUS
  Filled 2021-11-26 (×4): qty 50

## 2021-11-26 MED ORDER — IPRATROPIUM-ALBUTEROL 0.5-2.5 (3) MG/3ML IN SOLN
3.0000 mL | Freq: Four times a day (QID) | RESPIRATORY_TRACT | Status: DC
  Administered 2021-11-26 (×3): 3 mL via RESPIRATORY_TRACT
  Filled 2021-11-26 (×3): qty 3

## 2021-11-26 MED ORDER — GUAIFENESIN ER 600 MG PO TB12
1200.0000 mg | ORAL_TABLET | Freq: Two times a day (BID) | ORAL | Status: DC
  Administered 2021-11-26 – 2021-11-27 (×3): 1200 mg via ORAL
  Filled 2021-11-26 (×3): qty 2

## 2021-11-26 MED ORDER — ENOXAPARIN SODIUM 30 MG/0.3ML IJ SOSY
30.0000 mg | PREFILLED_SYRINGE | INTRAMUSCULAR | Status: DC
  Administered 2021-11-26 – 2021-11-27 (×2): 30 mg via SUBCUTANEOUS
  Filled 2021-11-26 (×2): qty 0.3

## 2021-11-26 MED ORDER — PANTOPRAZOLE SODIUM 40 MG PO TBEC
40.0000 mg | DELAYED_RELEASE_TABLET | Freq: Every day | ORAL | Status: DC
  Administered 2021-11-26 – 2021-11-27 (×2): 40 mg via ORAL
  Filled 2021-11-26 (×2): qty 1

## 2021-11-26 MED ORDER — TRAZODONE HCL 50 MG PO TABS
50.0000 mg | ORAL_TABLET | Freq: Every day | ORAL | Status: DC
  Administered 2021-11-26: 50 mg via ORAL
  Filled 2021-11-26: qty 1

## 2021-11-26 MED ORDER — ATORVASTATIN CALCIUM 20 MG PO TABS
20.0000 mg | ORAL_TABLET | Freq: Every day | ORAL | Status: DC
  Administered 2021-11-26 – 2021-11-27 (×2): 20 mg via ORAL
  Filled 2021-11-26 (×2): qty 1

## 2021-11-26 NOTE — Evaluation (Signed)
Physical Therapy Evaluation Patient Details Name: MARVINE ENCALADE MRN: [id number] DOB: 1960/12/08 Today's Date: 11/26/2021  History of Present Illness  GERRIANNE AYDELOTT is a 61 y.o. female with medical history significant of squamous cell carcinoma of the lung, COPD on 3 LPM of oxygen at baseline, anxiety, hyperlipidemia, GERD who presents to the emergency department via EMS from home due to reported passing out at home.  Patient denies LOC, she lives with sister, and while trying to turn on the light in the room, she fell to the floor due to shortness of breath, she was without oxygen.  She was confused throughout, sister heard the thud of her fall and quickly came to her rescue and activated EMS.  Apparently patient was reported to have had altered mental status since the last 2 days.  She denies chest pain, fever, chills, nausea, vomiting.   Clinical Impression  Patient demonstrates good return for sitting put at bedside, transferring to chair and commode, but tends to sway and lose balance when walking longer distances without AD, required use of RW for safety and limited for ambulation mostly due to severe SOB with SpO2 dropping from 93% to 85% while on 3 LPM O2.  Patient tolerated sitting up in chair after therapy and put back on 4 LPM O2 with SpO2 at 92% - RN notified.  Patient will benefit from continued skilled physical therapy in hospital and recommended venue below to increase strength, balance, endurance for safe ADLs and gait.        Recommendations for follow up therapy are one component of a multi-disciplinary discharge planning process, led by the attending physician.  Recommendations may be updated based on patient status, additional functional criteria and insurance authorization.  Follow Up Recommendations Home health PT      Assistance Recommended at Discharge Set up Supervision/Assistance  Patient can return home with the following  A little help with walking and/or  transfers;A little help with bathing/dressing/bathroom;Help with stairs or ramp for entrance;Assistance with cooking/housework    Equipment Recommendations Rolling walker (2 wheels)  Recommendations for Other Services       Functional Status Assessment Patient has had a recent decline in their functional status and demonstrates the ability to make significant improvements in function in a reasonable and predictable amount of time.     Precautions / Restrictions Precautions Precautions: Fall Restrictions Weight Bearing Restrictions: No      Mobility  Bed Mobility Overal bed mobility: Needs Assistance Bed Mobility: Supine to Sit     Supine to sit: Supervision, Modified independent (Device/Increase time)     General bed mobility comments: slightly increased time with labored movement    Transfers Overall transfer level: Needs assistance Equipment used: None, Rolling walker (2 wheels) Transfers: Sit to/from Stand, Bed to chair/wheelchair/BSC Sit to Stand: Min guard   Step pivot transfers: Min guard       General transfer comment: able to transfer to commode and chair without AD with slightly labored movement    Ambulation/Gait Ambulation/Gait assistance: Min guard, Min assist Gait Distance (Feet): 30 Feet Assistive device: None, 1 person hand held assist, Rolling walker (2 wheels) Gait Pattern/deviations: Decreased step length - right, Decreased step length - left, Decreased stride length, Staggering right, Staggering left Gait velocity: decreased     General Gait Details: able to take a few steps without AD, but tends to stagger left/right with near loss of balance, required use of RW for safety and limited mostly due to difficulty breathing with  SpO2 dropping from 93% to 85% while on 3 LPM O2  Stairs            Wheelchair Mobility    Modified Rankin (Stroke Patients Only)       Balance Overall balance assessment: Needs assistance Sitting-balance  support: Feet supported, No upper extremity supported Sitting balance-Leahy Scale: Fair Sitting balance - Comments: fair/good seated at EOB   Standing balance support: During functional activity, No upper extremity supported Standing balance-Leahy Scale: Poor Standing balance comment: fair/poor without AD, fair/good using RW                             Pertinent Vitals/Pain Pain Assessment Pain Assessment: No/denies pain    Home Living Family/patient expects to be discharged to:: Private residence Living Arrangements: Other relatives Available Help at Discharge: Family;Available 24 hours/day Type of Home: House Home Access: Stairs to enter Entrance Stairs-Rails: Right;Left;Can reach both (back steps) Entrance Stairs-Number of Steps: 4 steps in back with bilateral side rails, 4 steps in front without rails   Home Layout: One level Home Equipment: Grab bars - tub/shower;Shower seat;BSC/3in1      Prior Function Prior Level of Function : Independent/Modified Independent;Driving             Mobility Comments: community ambulator without AD ADLs Comments: Independent     Hand Dominance        Extremity/Trunk Assessment   Upper Extremity Assessment Upper Extremity Assessment: Overall WFL for tasks assessed    Lower Extremity Assessment Lower Extremity Assessment: Generalized weakness    Cervical / Trunk Assessment Cervical / Trunk Assessment: Normal  Communication   Communication: No difficulties  Cognition Arousal/Alertness: Awake/alert Behavior During Therapy: WFL for tasks assessed/performed Overall Cognitive Status: Within Functional Limits for tasks assessed                                          General Comments      Exercises     Assessment/Plan    PT Assessment Patient needs continued PT services  PT Problem List Decreased strength;Decreased activity tolerance;Decreased balance;Decreased mobility       PT  Treatment Interventions DME instruction;Gait training;Stair training;Functional mobility training;Therapeutic activities;Therapeutic exercise;Patient/family education;Balance training    PT Goals (Current goals can be found in the Care Plan section)  Acute Rehab PT Goals Patient Stated Goal: return home with family to assist PT Goal Formulation: With patient Time For Goal Achievement: 12/03/21 Potential to Achieve Goals: Good    Frequency Min 3X/week     Co-evaluation               AM-PAC PT "6 Clicks" Mobility  Outcome Measure Help needed turning from your back to your side while in a flat bed without using bedrails?: None Help needed moving from lying on your back to sitting on the side of a flat bed without using bedrails?: None Help needed moving to and from a bed to a chair (including a wheelchair)?: A Little Help needed standing up from a chair using your arms (e.g., wheelchair or bedside chair)?: A Little Help needed to walk in hospital room?: A Lot Help needed climbing 3-5 steps with a railing? : A Lot 6 Click Score: 18    End of Session Equipment Utilized During Treatment: Oxygen Activity Tolerance: Patient tolerated treatment well;Patient limited by fatigue Patient  left: in chair;with call bell/phone within reach Nurse Communication: Mobility status PT Visit Diagnosis: Unsteadiness on feet (R26.81);Other abnormalities of gait and mobility (R26.89);Muscle weakness (generalized) (M62.81)    Time: 7371-0626 PT Time Calculation (min) (ACUTE ONLY): 30 min   Charges:   PT Evaluation $PT Eval Moderate Complexity: 1 Mod PT Treatments $Therapeutic Activity: 23-37 mins        12:28 PM, 11/26/21 Lonell Grandchild, MPT Physical Therapist with [facility] 336 [phone] office [phone] mobile phone

## 2021-11-26 NOTE — ED Provider Notes (Signed)
  Provider Note MRN:  [id number]  Arrival date & time: 11/26/21    ED Course and Medical Decision Making  Assumed care from Dr. Venita Sheffield at shift change.  Fall, new hypoxia, history of lung cancer, awaiting CT head, CT PE.  Anticipating admission.  Patient remains tachycardic and mildly tachypneic on my assessment at 2 AM.  CT imaging without emergent process, no PE.  First troponin is negative.  Suspect COPD exacerbation, providing DuoNeb, steroids, will admit to medicine.  Procedures  Final Clinical Impressions(s) / ED Diagnoses     ICD-10-CM   1. Hypoxia  R09.02     2. Confusion  R41.0     3. Chronic obstructive pulmonary disease, unspecified COPD type (Page)  J44.9     4. Malignant neoplasm of lung, unspecified laterality, unspecified part of lung [facility])  C34.90       ED Discharge Orders     None       Discharge Instructions   None     Barth Kirks. [place] Small, [place] [email or web address]    Maudie Flakes, MD 11/26/21 [phone]

## 2021-11-26 NOTE — Progress Notes (Signed)
ASSUMPTION OF CARE NOTE   11/26/2021 1:51 PM  Donna Watkins was seen and examined.  The H&P by the admitting provider, orders, imaging was reviewed.  Please see new orders.  Will continue to follow.   Vitals:   11/26/21 1200 11/26/21 1304  BP: (!) 148/76   Pulse: (!) 106   Resp: (!) 22   Temp:    SpO2: 90% 93%    Results for orders placed or performed during the hospital encounter of 11/25/21  MRSA Next Gen by PCR, Nasal   Specimen: Nasal Mucosa; Nasal Swab  Result Value Ref Range   MRSA by PCR Next Gen NOT DETECTED NOT DETECTED  Comprehensive metabolic panel  Result Value Ref Range   Sodium 145 135 - 145 mmol/L   Potassium 4.0 3.5 - 5.1 mmol/L   Chloride 81 (L) 98 - 111 mmol/L   CO2 >45 (H) 22 - 32 mmol/L   Glucose, Bld 191 (H) 70 - 99 mg/dL   BUN 42 (H) 6 - 20 mg/dL   Creatinine, Ser 0.52 0.44 - 1.00 mg/dL   Calcium 9.7 8.9 - 10.3 mg/dL   Total Protein 8.5 (H) 6.5 - 8.1 g/dL   Albumin 4.9 3.5 - 5.0 g/dL   AST 32 15 - 41 U/L   ALT 22 0 - 44 U/L   Alkaline Phosphatase 71 38 - 126 U/L   Total Bilirubin 1.1 0.3 - 1.2 mg/dL   GFR, Estimated >60 >60 mL/min   Anion gap NOT CALCULATED 5 - 15  CBC  Result Value Ref Range   WBC 4.9 4.0 - 10.5 K/uL   RBC 4.70 3.87 - 5.11 MIL/uL   Hemoglobin 14.6 12.0 - 15.0 g/dL   HCT 51.2 (H) 36.0 - 46.0 %   MCV 108.9 (H) 80.0 - 100.0 fL   MCH 31.1 26.0 - 34.0 pg   MCHC 28.5 (L) 30.0 - 36.0 g/dL   RDW 11.8 11.5 - 15.5 %   Platelets 94 (L) 150 - 400 K/uL   nRBC 0.0 0.0 - 0.2 %  Blood gas, venous (at [facility] and [facility], not at [facility])  Result Value Ref Range   pH, Ven 7.36 7.25 - 7.43   pCO2, Ven >123 (HH) 44 - 60 mmHg   pO2, Ven <31 (LL) 32 - 45 mmHg   Bicarbonate 75.6 (H) 20.0 - 28.0 mmol/L   Acid-Base Excess 40.0 (H) 0.0 - 2.0 mmol/L   O2 Saturation 31.2 %   Patient temperature 36.2    Collection site RIGHT ANTECUBITAL    Drawn by 3716   Brain natriuretic peptide  Result Value Ref Range   B Natriuretic Peptide 59.0 0.0 - 100.0 pg/mL   CBC  Result Value Ref Range   WBC 6.7 4.0 - 10.5 K/uL   RBC 4.09 3.87 - 5.11 MIL/uL   Hemoglobin 12.8 12.0 - 15.0 g/dL   HCT 44.0 36.0 - 46.0 %   MCV 107.6 (H) 80.0 - 100.0 fL   MCH 31.3 26.0 - 34.0 pg   MCHC 29.1 (L) 30.0 - 36.0 g/dL   RDW 11.8 11.5 - 15.5 %   Platelets 92 (L) 150 - 400 K/uL   nRBC 0.0 0.0 - 0.2 %  Comprehensive metabolic panel  Result Value Ref Range   Sodium 143 135 - 145 mmol/L   Potassium 4.6 3.5 - 5.1 mmol/L   Chloride 80 (L) 98 - 111 mmol/L   CO2 >45 (H) 22 - 32 mmol/L   Glucose, Bld 101 (H)  70 - 99 mg/dL   BUN 38 (H) 6 - 20 mg/dL   Creatinine, Ser 0.47 0.44 - 1.00 mg/dL   Calcium 9.1 8.9 - 10.3 mg/dL   Total Protein 6.8 6.5 - 8.1 g/dL   Albumin 4.1 3.5 - 5.0 g/dL   AST 22 15 - 41 U/L   ALT 19 0 - 44 U/L   Alkaline Phosphatase 54 38 - 126 U/L   Total Bilirubin 1.0 0.3 - 1.2 mg/dL   GFR, Estimated >60 >60 mL/min   Anion gap NOT CALCULATED 5 - 15  APTT  Result Value Ref Range   aPTT 23 (L) 24 - 36 seconds  Magnesium  Result Value Ref Range   Magnesium 1.8 1.7 - 2.4 mg/dL  Phosphorus  Result Value Ref Range   Phosphorus 3.0 2.5 - 4.6 mg/dL  Vitamin B12  Result Value Ref Range   Vitamin B-12 262 180 - 914 pg/mL  Folate  Result Value Ref Range   Folate 5.4 (L) >5.9 ng/mL  CBG monitoring, ED  Result Value Ref Range   Glucose-Capillary 171 (H) 70 - 99 mg/dL  ECHOCARDIOGRAM COMPLETE  Result Value Ref Range   Weight 1,506.18 oz   Height 63 in   BP 127/75 mmHg   Area-P 1/2 2.07 cm2   S' Lateral 2.40 cm   P 1/2 time 226 msec  Troponin I (High Sensitivity)  Result Value Ref Range   Troponin I (High Sensitivity) 17 <18 ng/L  Troponin I (High Sensitivity)  Result Value Ref Range   Troponin I (High Sensitivity) 14 <18 ng/L     C. Wynetta Emery, MD [facility]   11/25/2021  7:51 PM How to contact the [facility] Attending or Consulting provider [place] or covering provider during after hours [place], for this patient?  Check the care team in [facility]  and look for a) attending/consulting [facility] provider listed and b) the [facility] team listed Log into [email or web address] and use [place]'s universal password to access. If you do not have the password, please contact the hospital operator. Locate the [facility] provider you are looking for under [facility] and page to a number that you can be directly reached. If you still have difficulty reaching the provider, please page the [facility] (Director on Call) for the Hospitalists listed on amion for assistance.

## 2021-11-26 NOTE — Plan of Care (Signed)
  Problem: Acute Rehab PT Goals(only PT should resolve) Goal: Pt Will Go Supine/Side To Sit Outcome: Progressing Flowsheets (Taken 11/26/2021 1229) Pt will go Supine/Side to Sit:  Independently  with modified independence Goal: Patient Will Transfer Sit To/From Stand Outcome: Progressing Flowsheets (Taken 11/26/2021 1229) Patient will transfer sit to/from stand: with modified independence Goal: Pt Will Transfer Bed To Chair/Chair To Bed Outcome: Progressing Flowsheets (Taken 11/26/2021 1229) Pt will Transfer Bed to Chair/Chair to Bed: with modified independence Goal: Pt Will Ambulate Outcome: Progressing Flowsheets (Taken 11/26/2021 1229) Pt will Ambulate:  50 feet  with modified independence  with supervision  with rolling walker   12:30 PM, 11/26/21 Lonell Grandchild, MPT Physical Therapist with [facility] [phone] office [phone] mobile phone

## 2021-11-26 NOTE — H&P (Signed)
History and Physical    Patient: Donna Watkins:[id number] DOB: March 13, 1960 DOA: 11/25/2021 DOS: the patient was seen and examined on 11/26/2021 PCP: Lemmie Evens, MD  Patient coming from: Home  Chief Complaint:  Chief Complaint  Patient presents with   Altered Mental Status   HPI: Donna Watkins is a 61 y.o. female with medical history significant of squamous cell carcinoma of the lung, COPD on 3 LPM of oxygen at baseline, anxiety, hyperlipidemia, GERD who presents to the emergency department via EMS from home due to reported passing out at home.  Patient denies LOC, she lives with sister, and while trying to turn on the light in the room, she fell to the floor due to shortness of breath, she was without oxygen.  She was confused throughout, sister heard the thud of her fall and quickly came to her rescue and activated EMS.  Apparently patient was reported to have had altered mental status since the last 2 days.  She denies chest pain, fever, chills, nausea, vomiting.  ED Course:  In the emergency department, she was tachypneic, tachycardic and O2 sat was 91 to 94% on supplemental oxygen at 3 LPM.  Workup in the ED showed MCV 108.9, hemoglobin 14.6, hematocrit 51.2, platelets 94.  BMP shows sodium 135, potassium 4.0, chloride 81, bicarb > 45, blood glucose 191, BUN 42, creatinine 0.52.  Troponin x 2 was negative, BNP 59, VBG showed a retention of CO2 at >123 (baseline CO2 retention unknown), she was alert and oriented x 3 and was able to maintain reasonable conversation. CT head without contrast showed no acute intracranial pathology CT angiography chest with contrast showed no evidence of pulmonary embolus. Continued slow enlargement of right lower lobe pulmonary nodule, now 8 x 8 mm compared to 7 x 5 mm previously Chest x-ray showed no active disease Breathing treatment was provided, Solu-Medrol was given.  Hospitalist was asked to admit patient for further evaluation and  management.  Review of Systems: Review of systems as noted in the HPI. All other systems reviewed and are negative.   Past Medical History:  Diagnosis Date   Anxiety    panic attacks   Cancer (HCC)    COPD (chronic obstructive pulmonary disease) ([place]) 03/01/2012   Depression    GERD (gastroesophageal reflux disease)    History of radiation therapy    Left Lung- 12/09/20-12/19/20- Dr. Gery Pray   Hyperlipidemia    Mild scoliosis    Oxygen dependent    Past Surgical History:  Procedure Laterality Date   BRONCHIAL BIOPSY  11/04/2020   Procedure: BRONCHIAL BIOPSIES;  Surgeon: Collene Gobble, MD;  Location: [place];  Service: Pulmonary;;   BRONCHIAL BRUSHINGS  11/04/2020   Procedure: BRONCHIAL BRUSHINGS;  Surgeon: Collene Gobble, MD;  Location: [place];  Service: Pulmonary;;   BRONCHIAL NEEDLE ASPIRATION BIOPSY  11/04/2020   Procedure: BRONCHIAL NEEDLE ASPIRATION BIOPSIES;  Surgeon: Collene Gobble, MD;  Location: [place] ENDOSCOPY;  Service: Pulmonary;;   COLONOSCOPY N/A 09/09/2012   Procedure: COLONOSCOPY;  Surgeon: Danie Binder, MD;  [facility];  Service: Endoscopy;  Laterality: N/A;  1:15-moved to 12:45 Melanie notified pt   FIDUCIAL MARKER PLACEMENT  11/04/2020   Procedure: FIDUCIAL MARKER PLACEMENT;  Surgeon: Collene Gobble, MD;  Location: [facility] ENDOSCOPY;  Service: Pulmonary;;   TUBAL LIGATION     VIDEO BRONCHOSCOPY WITH ENDOBRONCHIAL NAVIGATION N/A 11/04/2020   Procedure: ROBOTIC  VIDEO BRONCHOSCOPY WITH ENDOBRONCHIAL NAVIGATION;  Surgeon: Collene Gobble, MD;  Location: [facility] ENDOSCOPY;  Service: Pulmonary;  Laterality: N/A;    Social History:  reports that she quit smoking about 10 years ago. Her smoking use included cigarettes. She has a 75.00 pack-year smoking history. She has never used smokeless tobacco. She reports that she does not drink alcohol and does not use drugs.   No Known Allergies  Family History  Problem Relation Age of Onset   Heart  disease Father    COPD Mother    Arthritis Sister    Colon cancer Neg Hx      Prior to Admission medications   Medication Sig Start Date End Date Taking? Authorizing Provider  albuterol (PROVENTIL) (2.5 MG/3ML) 0.083% nebulizer solution Take 3 mLs (2.5 mg total) by nebulization in the morning, at noon, in the evening, and at bedtime. Mixes with Ipratropium solution 11/04/20   Byrum, Rose Fillers, MD  albuterol (VENTOLIN HFA) 108 (90 Base) MCG/ACT inhaler Inhale 2 puffs into the lungs every 6 (six) hours as needed for wheezing or shortness of breath. 11/04/20   Collene Gobble, MD  ALPRAZolam Duanne Moron) 1 MG tablet Take 1 mg by mouth 5 (five) times daily as needed. 11/04/20   [provider]  atorvastatin (LIPITOR) 20 MG tablet Take 20 mg by mouth daily. 05/06/20   [provider]  HYDROcodone-acetaminophen (NORCO/VICODIN) 5-325 MG tablet SMARTSIG:0.5-1 Tablet(s) By Mouth 1-3 Times Daily 08/06/21   [provider]  ibuprofen (ADVIL) 200 MG tablet Take 200 mg by mouth every 6 (six) hours as needed for mild pain.    [provider]  ipratropium (ATROVENT) 0.02 % nebulizer solution Take 0.5 mg by nebulization 4 (four) times daily. Mixes with Albuterol solution    [provider]  Multiple Vitamins-Minerals (CENTRUM SILVER PO) Take 1 tablet by mouth daily.    [provider]  nystatin cream (MYCOSTATIN) Apply 1 application  topically 2 (two) times daily as needed (rash). 07/01/20   [provider]  omeprazole (PRILOSEC) 20 MG capsule Take 20 mg by mouth daily.    [provider]  OXYGEN Place 2 L into the nose continuous.    [provider]  traZODone (DESYREL) 50 MG tablet Take 50 mg by mouth at bedtime. 06/25/17   [provider]    Physical Exam: BP 127/75   Pulse 95   Temp 98.7 F (37.1 C) (Oral)   Resp 17   Ht 5\' 3"  (1.6 m)   Wt 42.7 kg   LMP 05/06/2010   SpO2 97%   BMI 16.68 kg/m   General: 61 y.o.  year-old female cachectic, ill-appearing, but in no acute distress.  Alert and oriented x3. HEENT: NCAT, EOMI Neck: Supple, trachea medial Cardiovascular: Tachycardia.  Regular rate and rhythm with no rubs or gallops.  No thyromegaly or JVD noted.  No lower extremity edema. 2/4 pulses in all 4 extremities. Respiratory: Tachypnea, decreased breath sounds bilaterally with scattered wheezes  Abdomen: Soft, nontender nondistended with normal bowel sounds x4 quadrants. Muskuloskeletal: No cyanosis, clubbing or edema noted bilaterally Neuro: CN II-XII intact, strength 5/5 x 4, sensation, reflexes intact Skin: No ulcerative lesions noted or rashes Psychiatry: Mood is appropriate for condition and setting          Labs on Admission:  Basic Metabolic Panel: Recent Labs  Lab 11/25/21 2007  NA 145  K 4.0  CL 81*  CO2 >45*  GLUCOSE 191*  BUN 42*  CREATININE 0.52  CALCIUM 9.7   Liver Function Tests: Recent Labs  Lab 11/25/21 2007  AST 32  ALT 22  ALKPHOS 71  BILITOT 1.1  PROT 8.5*  ALBUMIN 4.9   No results for input(s): "LIPASE", "AMYLASE" in the last 168 hours. No results for input(s): "AMMONIA" in the last 168 hours. CBC: Recent Labs  Lab 11/25/21 2007  WBC 4.9  HGB 14.6  HCT 51.2*  MCV 108.9*  PLT 94*   Cardiac Enzymes: No results for input(s): "CKTOTAL", "CKMB", "CKMBINDEX", "TROPONINI" in the last 168 hours.  BNP (last 3 results) Recent Labs    11/26/21 0050  BNP 59.0    ProBNP (last 3 results) No results for input(s): "PROBNP" in the last 8760 hours.  CBG: Recent Labs  Lab 11/25/21 2018  GLUCAP 171*    Radiological Exams on Admission: CT Head Wo Contrast  Result Date: 11/26/2021 CLINICAL DATA:  Altered mental status. EXAM: CT HEAD WITHOUT CONTRAST TECHNIQUE: Contiguous axial images were obtained from the base of the skull through the vertex without intravenous contrast. RADIATION DOSE REDUCTION: This exam was performed according to the departmental  dose-optimization program which includes automated exposure control, adjustment of the mA and/or kV according to patient size and/or use of iterative reconstruction technique. COMPARISON:  Head CT and MRI dated 06/13/2011. FINDINGS: Evaluation of this exam is limited due to motion artifact. Brain: The ventricles and sulci appropriate size for patient's age. The gray-white matter discrimination is preserved. There is crowding of the foramen magnum in keeping with history of Chiari 1 malformation. There is no acute intracranial hemorrhage. No mass effect or midline shift. No extra-axial fluid collection. Vascular: No hyperdense vessel or unexpected calcification. Skull: Normal. Negative for fracture or focal lesion. Sinuses/Orbits: The visualized paranasal sinuses and mastoid air cells are clear. Other: None IMPRESSION: 1. No acute intracranial pathology. 2. Chiari 1 malformation. Electronically Signed   By: Anner Crete M.D.   On: 11/26/2021 01:18   CT Angio Chest PE W/Cm &/Or Wo Cm  Result Date: 11/26/2021 CLINICAL DATA:  Found on floor EXAM: CT ANGIOGRAPHY CHEST WITH CONTRAST TECHNIQUE: Multidetector CT imaging of the chest was performed using the standard protocol during bolus administration of intravenous contrast. Multiplanar CT image reconstructions and MIPs were obtained to evaluate the vascular anatomy. RADIATION DOSE REDUCTION: This exam was performed according to the departmental dose-optimization program which includes automated exposure control, adjustment of the mA and/or kV according to patient size and/or use of iterative reconstruction technique. CONTRAST:  17mL OMNIPAQUE IOHEXOL 350 MG/ML SOLN COMPARISON:  07/14/2021 FINDINGS: Cardiovascular: Heart is normal size. Aorta is normal caliber. Scattered coronary artery and aortic calcifications. No filling defects in the pulmonary arteries to suggest pulmonary emboli. Mediastinum/Nodes: No mediastinal, hilar, or axillary adenopathy. Trachea and  esophagus are unremarkable. Thyroid unremarkable. Lungs/Pleura: Moderate to advanced emphysema. Right lower lobe nodule measures 8 x 8 mm compared to 7 x 5 mm previously. Area of scarring in the left upper lobe is stable. No acute confluent opacities or effusions. Upper Abdomen: No acute findings Musculoskeletal: Chest wall soft tissues are unremarkable. No acute bony abnormality. Review of the MIP images confirms the above findings. IMPRESSION: No evidence of pulmonary embolus. Area of scarring in the medial left upper lobe is stable. Continued slow enlargement of right lower lobe pulmonary nodule, now 8 x 8 mm compared to 7 x 5 mm previously. Coronary artery disease. Aortic Atherosclerosis (ICD10-I70.0) and Emphysema (ICD10-J43.9). Electronically Signed   By: Rolm Baptise M.D.   On: 11/26/2021 01:15   DG Chest [facility] 1 View  Result  Date: 11/25/2021 CLINICAL DATA:  Shortness of breath EXAM: PORTABLE CHEST 1 VIEW COMPARISON:  Chest x-ray 11/04/2020 FINDINGS: The heart size and mediastinal contours are within normal limits. Both lungs are clear. The visualized skeletal structures are unremarkable. IMPRESSION: No active disease. Electronically Signed   By: Ronney Asters M.D.   On: 11/25/2021 23:33    EKG: I independently viewed the EKG done and my findings are as followed: Sinus tachycardia at a rate of 115 bpm  Assessment/Plan Present on Admission:  Acute exacerbation of chronic obstructive pulmonary disease (COPD) ([place])  Principal Problem:   Acute exacerbation of chronic obstructive pulmonary disease (COPD) ([place]) Active Problems:   Acute on chronic respiratory failure with hypoxia and hypercapnia ([place])   Fall at home, initial encounter   Near syncope   Oropharyngeal candidiasis   Elevated MCV   Thrombocytopenia (HCC)   Hyperglycemia   Nodule of lower lobe of right lung   Squamous cell carcinoma of lung, stage I, unspecified laterality (HCC)   GERD (gastroesophageal reflux disease)   Mixed  hyperlipidemia   Underweight   Failure to thrive in adult   Anxiety   Insomnia  Acute exacerbation of COPD Acute on chronic respiratory failure with hypoxia and hypercapnia Continue duo nebs, Mucinex, Solu-Medrol, azithromycin. Continue Protonix to prevent steroid-induced ulcer Continue incentive spirometry and flutter valve Continue supplemental oxygen to maintain O2 sat > 94% with plan to wean patient off oxygen as tolerated Patient with high retention of CO2 (baseline unknown) but was alert and oriented x 3.  Monitor closely for mental status changes as she is at risk of possibly requiring BiPAP/intubation  Fall vs Near Syncope Continue telemetry and watch for arrhythmias Troponins x 2 - 17 > 14 EKG showed Sinus tachycardia at a rate of 115 bpm Echocardiogram will be done to rule out significant aortic stenosis or other outflow obstruction, and also to evaluate EF and to rule out segmental/Regional wall motion abnormalities.  Carotid artery Dopplers will be done to rule out hemodynamically significant stenosis  Oropharyngeal candidiasis Continue fluconazole  Thrombocytopenia Platelets 94, continue to monitor platelet levels  Elevated MCV MCV 108.9, vitamin B12 and folate levels will be checked  Hyperglycemia possibly reactive CBG 191, no history of T2DM Continue to monitor blood glucose level  Pulmonary nodule CT angiogram of her chest showed right lower lobe pulmonary nodule, now 8 x 8 mm compared to 7 x 5 mm previously. Patient follows with Dr. Delton Coombes  Stage I left upper lobe squamous cell lung cancer Patient follows with Dr. Delton Coombes  GERD Continue Protonix  Mixed hyperlipidemia Continue Lipitor  Failure to thrive in adult Underweight (BMI 16.68) Protein supplement to be provided Dietitian will be consulted and we shall await further recommendation  Anxiety Continuing Xanax  Insomnia Continue home trazodone  DVT prophylaxis: Lovenox  Code  Status: Full code  Family Communication: Daughter and grandson at bedside (all questions answered to satisfaction)  Consults: None  Severity of Illness: The appropriate patient status for this patient is OBSERVATION. Observation status is judged to be reasonable and necessary in order to provide the required intensity of service to ensure the patient's safety. The patient's presenting symptoms, physical exam findings, and initial radiographic and laboratory data in the context of their medical condition is felt to place them at decreased risk for further clinical deterioration. Furthermore, it is anticipated that the patient will be medically stable for discharge from the hospital within 2 midnights of admission.   Author: Bernadette Hoit, DO 11/26/2021  7:10 AM  For on call review [email or web address].

## 2021-11-26 NOTE — Progress Notes (Signed)
*  PRELIMINARY RESULTS* Echocardiogram 2D Echocardiogram has been performed.  Donna Watkins 11/26/2021, 10:13 AM

## 2021-11-26 NOTE — Progress Notes (Signed)
Initial Nutrition Assessment  DOCUMENTATION CODES:   Severe malnutrition in context of chronic illness, Underweight  INTERVENTION:  Liberalize diet to least restrictive given pt limited meal intake and malnourished state  Ensure Enlive po TID, each supplement provides 350 kcal and 20 grams of protein.    MVI daily  NUTRITION DIAGNOSIS:   Severe Malnutrition related to catabolic illness, chronic illness as evidenced by energy intake < 75% for > or equal to 1 month, moderate fat depletion, moderate muscle depletion, severe muscle depletion, percent weight loss (11% x 3 months)   GOAL:  Patient will meet greater than or equal to 90% of their needs   MONITOR:  PO intake, Supplement acceptance, Labs, Weight trends  REASON FOR ASSESSMENT:   Consult Assessment of nutrition requirement/status  ASSESSMENT: Patient is an underweight 61 yo female who presents with altered mental status, increased weakness and decreased po 2 days PTA.  PMH: COPD, severe emphysema  Patient lives with her sister. They prepared meals together. She eats primarily soft foods due to being edentulous. Affirms usually eats a healthy diet and meal pattern is 2 meals daily (not a big breakfast eater). Able to feed herself. She likes broccoli, cucumbers, and other veggies and eats fish and occasionally chicken. Beverage mainly water. Suspect chronic undernutrition given her diet history and increased protein energy needs related to chronic severe lung disease.   PO meals - per patient 25-50% today (breakfast and lunch)   She likes chocolate ensure and is agreeable to supplementing nutrition while she is hospitalized. Recommend she continue with high protein/high calorie supplement BID at home as well.   Per chart patient has weight loss ~11% the past 3 months which is significant. Discussed with nursing.   Medications: solumedrol, protonix, lipitor      Latest Ref Rng & Units 11/26/2021    7:09 AM 11/25/2021     8:07 PM 07/14/2021    2:46 PM  BMP  Glucose 70 - 99 mg/dL 101  191  102   BUN 6 - 20 mg/dL 38  42  21   Creatinine 0.44 - 1.00 mg/dL 0.47  0.52  0.45   Sodium 135 - 145 mmol/L 143  145  141   Potassium 3.5 - 5.1 mmol/L 4.6  4.0  3.7   Chloride 98 - 111 mmol/L 80  81  98   CO2 22 - 32 mmol/L >45  >45  40   Calcium 8.9 - 10.3 mg/dL 9.1  9.7  8.7       NUTRITION - FOCUSED PHYSICAL EXAM:  Flowsheet Row Most Recent Value  Orbital Region Moderate depletion  Upper Arm Region Mild depletion  Thoracic and Lumbar Region Moderate depletion  Buccal Region Mild depletion  Temple Region Mild depletion  Clavicle Bone Region Severe depletion  Clavicle and Acromion Bone Region Moderate depletion  Dorsal Hand Moderate depletion  Patellar Region Moderate depletion  Anterior Thigh Region Mild depletion  Posterior Calf Region No depletion  Edema (RD Assessment) None  Hair Reviewed  Eyes Reviewed  Mouth Reviewed  Skin Reviewed  Nails Reviewed       Diet Order:   Diet Order             Diet heart healthy/carb modified Room service appropriate? Yes; Fluid consistency: Thin  Diet effective now                   EDUCATION NEEDS:  Education needs have been addressed  Skin:  Skin Assessment: Reviewed  RN Assessment  Last BM:  unknown  Height:   Ht Readings from Last 1 Encounters:  11/26/21 5\' 3"  (1.6 m)    Weight:   Wt Readings from Last 1 Encounters:  11/26/21 42.7 kg    Ideal Body Weight:   52 kg  BMI:  Body mass index is 16.68 kg/m.  Estimated Nutritional Needs:   Kcal:  1505-1700  Protein:  75-80gr  Fluid:  1500 ml daily   Colman Cater MS,RD,CSG,LDN Contact: Shea Evans

## 2021-11-26 NOTE — TOC Initial Note (Signed)
Transition of Care [facility]) - Initial/Assessment Note    Patient Details  Name: Donna Watkins MRN: [id number] Date of Birth: 25-Jan-1960  Transition of Care [facility]) CM/SW Contact:    Salome Arnt, LCSW Phone Number: 11/26/2021, 10:39 AM  Clinical Narrative: Pt admitted due to acute exacerbation of COPD. Assessment completed with pt. Pt reports she lives with her sister and is mostly independent with ADLs. She has a rolling walker. Pt is on 3L home O2. She requests 3N1. No preference on agency. Referred and accepted by [facility] and will drop ship to home. PT evaluated pt and recommend home health. Pt is agreeable and has no preference on agency. Referred and accepted by [facility] with Alvis Lemmings. Home health and DME orders in. TOC will continue to follow.                   Expected Discharge Plan: [place] Barriers to Discharge: Continued Medical Work up   Patient Goals and CMS Choice Patient states their goals for this hospitalization and ongoing recovery are:: return home   Choice offered to / list presented to : Patient  Expected Discharge Plan and Services Expected Discharge Plan: [place] In-house Referral: Clinical Social Work   Post Acute Care Choice: [place] arrangements for the past 2 months: [place]                 DME Arranged: 3-N-1 DME Agency: [facility] Date DME Agency Contacted: 11/26/21 Time DME Agency Contacted: [phone] Representative spoke with at DME Agency: Erasmo Downer HH Arranged: PT [place] Agency: [place] Date [place]: 11/26/21 Time HH Agency Contacted: [phone] Representative spoke with at [place]: Tommi Rumps  Prior Living Arrangements/Services Living arrangements for the past 2 months: [place] with:: Siblings Patient language and need for interpreter reviewed:: Yes Do you feel safe going back to the place where you live?: Yes          Current home services: DME  (walker, home O2) Criminal Activity/Legal Involvement Pertinent to Current Situation/Hospitalization: No - Comment as needed  Activities of Daily Living Home Assistive Devices/Equipment: Dentures (specify type), Eyeglasses ADL Screening (condition at time of admission) Patient's cognitive ability adequate to safely complete daily activities?: No Is the patient deaf or have difficulty hearing?: No Does the patient have difficulty seeing, even when wearing glasses/contacts?: No Does the patient have difficulty concentrating, remembering, or making decisions?: No Patient able to express need for assistance with ADLs?: Yes Does the patient have difficulty dressing or bathing?: No Independently performs ADLs?: Yes (appropriate for developmental age) Does the patient have difficulty walking or climbing stairs?: Yes Weakness of Legs: Both Weakness of Arms/Hands: None  Permission Sought/Granted                  Emotional Assessment     Affect (typically observed): Appropriate Orientation: : Oriented to Self, Oriented to Place, Oriented to  Time, Oriented to Situation Alcohol / Substance Use: Not Applicable Psych Involvement: No (comment)  Admission diagnosis:  Confusion [R41.0] Acute exacerbation of chronic obstructive pulmonary disease (COPD) ([place]) [J44.1] Hypoxia [R09.02] Malignant neoplasm of lung, unspecified laterality, unspecified part of lung ([place]) [C34.90] Chronic obstructive pulmonary disease, unspecified COPD type ([place]) [J44.9] Patient Active Problem List   Diagnosis Date Noted   Acute on chronic respiratory failure with hypoxia and hypercapnia ([place]) 11/26/2021   Fall at home, initial encounter 11/26/2021   Near syncope 11/26/2021  Oropharyngeal candidiasis 11/26/2021   Elevated MCV 11/26/2021   Thrombocytopenia (HCC) 11/26/2021   Hyperglycemia 11/26/2021   Nodule of lower lobe of right lung 11/26/2021   Squamous cell carcinoma of lung, stage I, unspecified  laterality ([place]) 11/26/2021   GERD (gastroesophageal reflux disease) 11/26/2021   Mixed hyperlipidemia 11/26/2021   Underweight 11/26/2021   Failure to thrive in adult 11/26/2021   Anxiety 11/26/2021   Insomnia 11/26/2021   Dysphagia 02/20/2021   Squamous carcinoma of lung, left ([place]) 11/12/2020   Nodule of upper lobe of left lung 08/12/2020   Rash 08/28/2019   Encounter for screening colonoscopy 08/31/2012   COPD  GOLD IV  06/30/2011   Pulmonary nodule seen on imaging study 06/15/2011   Polycythemia secondary to smoking 06/15/2011   Iron deficiency anemia 06/15/2011   Dehydration, severe 06/14/2011   Hypotension 06/14/2011   Sinus bradycardia 06/14/2011   Arnold-Chiari malformation- mild 06/13/2011   Acute exacerbation of chronic obstructive pulmonary disease (COPD) ([place]) 06/13/2011   Depression 06/13/2011   Weight loss, non-intentional 06/13/2011   Hypokalemia 06/13/2011   Acute respiratory failure with hypoxia (HCC) 06/13/2011   Macrocytosis 06/13/2011   Chronic respiratory failure with hypoxia and hypercapnia ([place]) 06/13/2011   PCP:  Lemmie Evens, MD Pharmacy:   [place], [place] [address] [place] [address] Phone: [phone] Fax: [phone]     Social Determinants of Health (SDOH) Interventions    Readmission Risk Interventions     No data to display

## 2021-11-27 DIAGNOSIS — D696 Thrombocytopenia, unspecified: Secondary | ICD-10-CM

## 2021-11-27 DIAGNOSIS — J441 Chronic obstructive pulmonary disease with (acute) exacerbation: Secondary | ICD-10-CM | POA: Diagnosis not present

## 2021-11-27 DIAGNOSIS — R718 Other abnormality of red blood cells: Secondary | ICD-10-CM | POA: Diagnosis not present

## 2021-11-27 DIAGNOSIS — J9621 Acute and chronic respiratory failure with hypoxia: Secondary | ICD-10-CM | POA: Diagnosis not present

## 2021-11-27 MED ORDER — FLUCONAZOLE 100 MG PO TABS: 100.0000 mg | ORAL_TABLET | Freq: Every day | ORAL | 0 refills | Status: DC

## 2021-11-27 MED ORDER — IPRATROPIUM-ALBUTEROL 0.5-2.5 (3) MG/3ML IN SOLN: 3.0000 mL | Freq: Three times a day (TID) | RESPIRATORY_TRACT | 3 refills | Status: AC

## 2021-11-27 MED ORDER — ALPRAZOLAM 1 MG PO TABS: 1.0000 mg | ORAL_TABLET | Freq: Three times a day (TID) | ORAL | 0 refills | Status: DC | PRN

## 2021-11-27 MED ORDER — DOXYCYCLINE HYCLATE 100 MG PO CAPS: 100.0000 mg | ORAL_CAPSULE | Freq: Every day | ORAL | 0 refills | Status: AC

## 2021-11-27 MED ORDER — FOLIC ACID 1 MG PO TABS: 1.0000 mg | ORAL_TABLET | Freq: Every day | ORAL | 3 refills | Status: DC

## 2021-11-27 MED ORDER — PREDNISONE 20 MG PO TABS: ORAL_TABLET | ORAL | 0 refills | Status: DC

## 2021-11-27 MED ORDER — GUAIFENESIN ER 600 MG PO TB12: 600.0000 mg | ORAL_TABLET | Freq: Two times a day (BID) | ORAL | 0 refills | Status: AC

## 2021-11-27 MED ORDER — ALBUTEROL SULFATE HFA 108 (90 BASE) MCG/ACT IN AERS: 2.0000 | INHALATION_SPRAY | RESPIRATORY_TRACT | 3 refills | Status: AC | PRN

## 2021-11-27 NOTE — Discharge Summary (Signed)
Physician Discharge Summary  Donna Watkins:[id number] DOB: 03/04/1960 DOA: 11/25/2021  PCP: Lemmie Evens, MD  Admit date: 11/25/2021 Discharge date: 11/27/2021  Admitted From:  Home  Disposition: Home   Recommendations for Outpatient Follow-up:  Follow up with PCP in 1 weeks   Discharge Condition: STABLE   CODE STATUS: FULL DIET: regular    Brief Hospitalization Summary: Please see all hospital notes, images, labs for full details of the hospitalization. ADMISSION HPI: Donna Watkins is a 61 y.o. female with medical history significant of squamous cell carcinoma of the lung, COPD on 3 LPM of oxygen at baseline, anxiety, hyperlipidemia, GERD who presents to the emergency department via EMS from home due to reported passing out at home.  Patient denies LOC, she lives with sister, and while trying to turn on the light in the room, she fell to the floor due to shortness of breath, she was without oxygen.  She was confused throughout, sister heard the thud of her fall and quickly came to her rescue and activated EMS.  Apparently patient was reported to have had altered mental status since the last 2 days.  She denies chest pain, fever, chills, nausea, vomiting.   ED Course:  In the emergency department, she was tachypneic, tachycardic and O2 sat was 91 to 94% on supplemental oxygen at 3 LPM.  Workup in the ED showed MCV 108.9, hemoglobin 14.6, hematocrit 51.2, platelets 94.  BMP shows sodium 135, potassium 4.0, chloride 81, bicarb > 45, blood glucose 191, BUN 42, creatinine 0.52.  Troponin x 2 was negative, BNP 59, VBG showed a retention of CO2 at >123 (baseline CO2 retention unknown), she was alert and oriented x 3 and was able to maintain reasonable conversation. CT head without contrast showed no acute intracranial pathology CT angiography chest with contrast showed no evidence of pulmonary embolus. Continued slow enlargement of right lower lobe pulmonary nodule, now 8 x 8 mm  compared to 7 x 5 mm previously Chest x-ray showed no active disease.  Breathing treatment was provided, Solu-Medrol was given.  Hospitalist was asked to admit patient for further evaluation and management.  HOSPITAL COURSE BY PROBLEM  Acute exacerbation of COPD Acute on chronic respiratory failure with hypoxia and hypercapnia Pt was treated with duo nebs, Mucinex, Solu-Medrol, azithromycin. Continue Protonix to prevent steroid-induced ulcer Continue incentive spirometry and flutter valve Continue supplemental oxygen to maintain O2 sat > 94% with plan to wean patient off oxygen as tolerated Pt reports feeling much better and insistent on going home today.  DC home with close outpatient follow up. Pt will discharge home on steroid taper, doxycycline, bronchodilators, mucolytics    Fall vs Near Syncope - secondary to polypharmacy (Pt taking up to 4-5 alprazolam tabs per day!)  Pt strongly advised to wean down to 3 alprazolam tabs per day MAX Continue telemetry and watch for arrhythmias Troponins x 2 - 17 > 14 EKG showed Sinus tachycardia at a rate of 115 bpm Echocardiogram no significant findings - see below report.   Carotid artery Dopplers neg for hemodynamically significant stenosis   Oropharyngeal candidiasis Continue fluconazole x 5 more days    Thrombocytopenia Platelets 94 but stable with no bleeding   Elevated MCV / Folate Deficiency  MCV 108.9, vitamin B12 and folate levels checked R15 normal, low folic acid Rx for folic acid daily ordered    Hyperglycemia possibly reactive Secondary to steroids    Pulmonary nodule CT angiogram of her chest showed right lower lobe pulmonary  nodule, now 8 x 8 mm compared to 7 x 5 mm previously. Patient follows with Dr. 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 Delton Coombes   GERD Continue home omeprazole    Mixed hyperlipidemia Continue Lipitor   Failure to thrive in adult Underweight  (BMI 16.68) Protein supplement to be provided Dietitian consulted    Anxiety Continuing Xanax but we have advised patient to reduce to up to 3 tabs per day MAX Pt says she has been taking up to 4-5 tabs per day  Pt advised to discuss with her PCP    Insomnia Continue home trazodone   DVT prophylaxis: Lovenox   Code Status: Full code  Discharge Diagnoses:  Principal Problem:   Acute exacerbation of chronic obstructive pulmonary disease (COPD) ([place]) Active Problems:   Acute on chronic respiratory failure with hypoxia and hypercapnia ([place])   Fall at home, initial encounter   Near syncope   Oropharyngeal candidiasis   Elevated MCV   Thrombocytopenia (HCC)   Hyperglycemia   Nodule of lower lobe of right lung   Squamous cell carcinoma of lung, stage I, unspecified laterality (HCC)   GERD (gastroesophageal reflux disease)   Mixed hyperlipidemia   Underweight   Failure to thrive in adult   Anxiety   Insomnia   Protein-calorie malnutrition, severe   Discharge Instructions:  Allergies as of 11/27/2021   No Known Allergies      Medication List     STOP taking these medications    HYDROcodone-acetaminophen 5-325 MG tablet Commonly known as: NORCO/VICODIN   ipratropium 0.02 % nebulizer solution Commonly known as: ATROVENT   potassium chloride 10 MEQ tablet Commonly known as: KLOR-CON M       TAKE these medications    albuterol 108 (90 Base) MCG/ACT inhaler Commonly known as: VENTOLIN HFA Inhale 2 puffs into the lungs every 4 (four) hours as needed for wheezing or shortness of breath. What changed:  when to take this Another medication with the same name was removed. Continue taking this medication, and follow the directions you see here.   ALPRAZolam 1 MG tablet Commonly known as: XANAX Take 1 tablet (1 mg total) by mouth 3 (three) times daily as needed for anxiety. What changed: when to take this   atorvastatin 20 MG tablet Commonly known as:  LIPITOR Take 20 mg by mouth daily.   CENTRUM SILVER PO Take 1 tablet by mouth daily.   doxycycline 100 MG capsule Commonly known as: VIBRAMYCIN Take 1 capsule (100 mg total) by mouth daily for 5 days. Start taking on: November 28, 2021   fluconazole 100 MG tablet Commonly known as: DIFLUCAN Take 1 tablet (100 mg total) by mouth daily. Start taking on: November 28, 4268   folic acid 1 MG tablet Commonly known as: FOLVITE Take 1 tablet (1 mg total) by mouth daily.   guaiFENesin 600 MG 12 hr tablet Commonly known as: MUCINEX Take 1 tablet (600 mg total) by mouth 2 (two) times daily for 3 days.   ibuprofen 200 MG tablet Commonly known as: ADVIL Take 200 mg by mouth every 6 (six) hours as needed for mild pain.   ipratropium-albuterol 0.5-2.5 (3) MG/3ML Soln Commonly known as: DUONEB Take 3 mLs by nebulization 3 (three) times daily. And as needed for SOB What changed:  when to take this reasons to take this additional instructions   omeprazole 20 MG capsule Commonly known as: PRILOSEC Take 20 mg by mouth  daily.   OXYGEN Place 2 L into the nose continuous.   predniSONE 20 MG tablet Commonly known as: DELTASONE Take 3 PO QAM x5days, 2 PO QAM x5days, 1 PO QAM x5days Start taking on: November 28, 2021   traZODone 50 MG tablet Commonly known as: DESYREL Take 50 mg by mouth at bedtime.               Durable Medical Equipment  (From admission, onward)           Start     Ordered   11/26/21 1232  For home use only DME Walker rolling  Once       Comments: Patient unsteady with near fall when walking without AD, safer using RW with good return for use demonstrated  Question Answer Comment  Walker: With [place]   Patient needs a walker to treat with the following condition Gait difficulty      11/26/21 1232   11/26/21 1001  For home use only DME 3 n 1  Once        11/26/21 1000            Follow-up Information     [facility], [facility]  Follow up.   Specialty: [place] Why: PT will call to schedule your first home visit Contact information: [address] [address] [phone]         Lemmie Evens, MD. Schedule an appointment as soon as possible for a visit in 1 week(s).   Specialty: Family Medicine Why: Hospital Follow Up Contact information: [place] [address] [phone]                No Known Allergies Allergies as of 11/27/2021   No Known Allergies      Medication List     STOP taking these medications    HYDROcodone-acetaminophen 5-325 MG tablet Commonly known as: NORCO/VICODIN   ipratropium 0.02 % nebulizer solution Commonly known as: ATROVENT   potassium chloride 10 MEQ tablet Commonly known as: KLOR-CON M       TAKE these medications    albuterol 108 (90 Base) MCG/ACT inhaler Commonly known as: VENTOLIN HFA Inhale 2 puffs into the lungs every 4 (four) hours as needed for wheezing or shortness of breath. What changed:  when to take this Another medication with the same name was removed. Continue taking this medication, and follow the directions you see here.   ALPRAZolam 1 MG tablet Commonly known as: XANAX Take 1 tablet (1 mg total) by mouth 3 (three) times daily as needed for anxiety. What changed: when to take this   atorvastatin 20 MG tablet Commonly known as: LIPITOR Take 20 mg by mouth daily.   CENTRUM SILVER PO Take 1 tablet by mouth daily.   doxycycline 100 MG capsule Commonly known as: VIBRAMYCIN Take 1 capsule (100 mg total) by mouth daily for 5 days. Start taking on: November 28, 2021   fluconazole 100 MG tablet Commonly known as: DIFLUCAN Take 1 tablet (100 mg total) by mouth daily. Start taking on: November 28, 8336   folic acid 1 MG tablet Commonly known as: FOLVITE Take 1 tablet (1 mg total) by mouth daily.   guaiFENesin 600 MG 12 hr tablet Commonly known as: MUCINEX Take 1  tablet (600 mg total) by mouth 2 (two) times daily for 3 days.   ibuprofen 200 MG tablet Commonly known as: ADVIL Take 200 mg by mouth  every 6 (six) hours as needed for mild pain.   ipratropium-albuterol 0.5-2.5 (3) MG/3ML Soln Commonly known as: DUONEB Take 3 mLs by nebulization 3 (three) times daily. And as needed for SOB What changed:  when to take this reasons to take this additional instructions   omeprazole 20 MG capsule Commonly known as: PRILOSEC Take 20 mg by mouth daily.   OXYGEN Place 2 L into the nose continuous.   predniSONE 20 MG tablet Commonly known as: DELTASONE Take 3 PO QAM x5days, 2 PO QAM x5days, 1 PO QAM x5days Start taking on: November 28, 2021   traZODone 50 MG tablet Commonly known as: DESYREL Take 50 mg by mouth at bedtime.               Durable Medical Equipment  (From admission, onward)           Start     Ordered   11/26/21 1232  For home use only DME Walker rolling  Once       Comments: Patient unsteady with near fall when walking without AD, safer using RW with good return for use demonstrated  Question Answer Comment  Walker: With [place]   Patient needs a walker to treat with the following condition Gait difficulty      11/26/21 1232   11/26/21 1001  For home use only DME 3 n 1  Once        11/26/21 1000            Procedures/Studies: US Carotid Bilateral  Result Date: 11/26/2021 CLINICAL DATA:  61 year old female with syncope EXAM: BILATERAL CAROTID DUPLEX ULTRASOUND TECHNIQUE: Pearline Cables scale imaging, color Doppler and duplex ultrasound were performed of bilateral carotid and vertebral arteries in the neck. COMPARISON:  None Available. FINDINGS: Criteria: Quantification of carotid stenosis is based on velocity parameters that correlate the residual internal carotid diameter with NASCET-based stenosis levels, using the diameter of the distal internal carotid lumen as the denominator for stenosis measurement. The  following velocity measurements were obtained: RIGHT ICA:  Systolic 65 cm/sec, Diastolic 19 cm/sec CCA:  75 cm/sec SYSTOLIC ICA/CCA RATIO:  0.9 ECA:  109 cm/sec LEFT ICA:  Systolic 77 cm/sec, Diastolic 22 cm/sec CCA:  72 cm/sec SYSTOLIC ICA/CCA RATIO:  1.1 ECA:  94 cm/sec Right Brachial SBP: Not acquired Left Brachial SBP: Not acquired RIGHT CAROTID ARTERY: No significant calcified disease of the right common carotid artery. Intermediate waveform maintained. Homogeneous plaque without significant calcifications at the right carotid bifurcation. Low resistance waveform of the right ICA. No significant tortuosity. RIGHT VERTEBRAL ARTERY: Antegrade flow with low resistance waveform. LEFT CAROTID ARTERY: No significant calcified disease of the left common carotid artery. Intermediate waveform maintained. Homogeneous plaque at the left carotid bifurcation without significant calcifications. Low resistance waveform of the left ICA. LEFT VERTEBRAL ARTERY:  Antegrade flow with low resistance waveform. IMPRESSION: Color duplex indicates minimal homogeneous plaque, with no hemodynamically significant stenosis by duplex criteria in the extracranial cerebrovascular circulation. Signed, Dulcy Fanny. Nadene Rubins, RPVI [facility] Electronically Signed   By: Corrie Mckusick D.O.   On: 11/26/2021 11:20   ECHOCARDIOGRAM COMPLETE  Result Date: 11/26/2021    ECHOCARDIOGRAM REPORT   Patient Name:   Donna Watkins Date of Exam: 11/26/2021 Medical Rec #:  [id number]         Height:       63.0 in Accession #:    [id number]  Weight:       94.1 lb Date of Birth:  [birth year]        BSA:          1.404 m Patient Age:    61 years          BP:           112/60 mmHg Patient Gender: F                 HR:           98 bpm. Exam Location:  [facility] Procedure: 2D Echo, Cardiac Doppler and Color Doppler Indications:    Syncope R55  History:        Patient has no prior history  of Echocardiogram examinations.                 COPD; Risk Factors:Dyslipidemia. Acute on chronic respiratory                 failure with hypoxia and hypercapnia (HCC), Squamous cell                 carcinoma of lung, stage I, unspecified laterality ([place]).  Sonographer:    Alvino Chapel RCS Referring Phys: [id number] OLADAPO ADEFESO IMPRESSIONS  1. Left ventricular ejection fraction, by estimation, is 60 to 65%. The left ventricle has normal function. The left ventricle has no regional wall motion abnormalities. Left ventricular diastolic parameters are indeterminate.  2. Right ventricular systolic function is normal. The right ventricular size is normal. Tricuspid regurgitation signal is inadequate for assessing PA pressure.  3. The mitral valve is degenerative. Trivial mitral valve regurgitation.  4. The aortic valve is tricuspid. There is mild aortic regurgitation.  5. The inferior vena cava is normal in size with greater than 50% respiratory variability, suggesting right atrial pressure of 3 mmHg. Comparison(s): No prior Echocardiogram. FINDINGS  Left Ventricle: Left ventricular ejection fraction, by estimation, is 60 to 65%. The left ventricle has normal function. The left ventricle has no regional wall motion abnormalities. The left ventricular internal cavity size was normal in size. There is  no left ventricular hypertrophy. Left ventricular diastolic parameters are indeterminate. Right Ventricle: The right ventricular size is normal. No increase in right ventricular wall thickness. Right ventricular systolic function is normal. Tricuspid regurgitation signal is inadequate for assessing PA pressure. Left Atrium: Left atrial size was normal in size. Right Atrium: Right atrial size was normal in size. Pericardium: There is no evidence of pericardial effusion. Mitral Valve: The mitral valve is degenerative in appearance. There is mild thickening of the mitral valve leaflet(s). Trivial mitral valve regurgitation.  Tricuspid Valve: The tricuspid valve is grossly normal. Tricuspid valve regurgitation is trivial. Aortic Valve: The aortic valve is tricuspid. Aortic valve regurgitation is mild. No aortic stenosis is present. Pulmonic Valve: The pulmonic valve was not well visualized. Pulmonic valve regurgitation is trivial. Aorta: The aortic root is normal in size and structure. Venous: The inferior vena cava is normal in size with greater than 50% respiratory variability, suggesting right atrial pressure of 3 mmHg. IAS/Shunts: No atrial level shunt detected by color flow Doppler.  LEFT VENTRICLE PLAX 2D LVIDd:         4.00 cm   Diastology LVIDs:         2.40 cm   LV e' medial:    7.94 cm/s LV PW:         0.90 cm   LV E/e' medial:  6.8 LV IVS:  0.90 cm   LV e' lateral:   9.57 cm/s LVOT diam:     2.00 cm   LV E/e' lateral: 5.6 LV SV:         63 LV SV Index:   45 LVOT Area:     3.14 cm  RIGHT VENTRICLE RV S prime:     13.90 cm/s TAPSE (M-mode): 2.2 cm LEFT ATRIUM             Index        RIGHT ATRIUM           Index LA diam:        2.10 cm 1.50 cm/m   RA Area:     11.80 cm LA Vol (A2C):   28.0 ml 19.95 ml/m  RA Volume:   24.70 ml  17.60 ml/m LA Vol (A4C):   23.0 ml 16.38 ml/m LA Biplane Vol: 22.0 ml 15.67 ml/m  AORTIC VALVE LVOT Vmax:   109.00 cm/s LVOT Vmean:  74.100 cm/s LVOT VTI:    0.199 m  AORTA Ao Root diam: 3.60 cm MITRAL VALVE MV Area (PHT): 2.07 cm    SHUNTS MV Decel Time: 366 msec    Systemic VTI:  0.20 m MV E velocity: 54.00 cm/s  Systemic Diam: 2.00 cm MV A velocity: 89.50 cm/s MV E/A ratio:  0.60 Rozann Lesches MD Electronically signed by Rozann Lesches MD Signature Date/Time: 11/26/2021/10:30:45 AM    Final    CT Head Wo Contrast  Result Date: 11/26/2021 CLINICAL DATA:  Altered mental status. EXAM: CT HEAD WITHOUT CONTRAST TECHNIQUE: Contiguous axial images were obtained from the base of the skull through the vertex without intravenous contrast. RADIATION DOSE REDUCTION: This exam was performed  according to the departmental dose-optimization program which includes automated exposure control, adjustment of the mA and/or kV according to patient size and/or use of iterative reconstruction technique. COMPARISON:  Head CT and MRI dated 06/13/2011. FINDINGS: Evaluation of this exam is limited due to motion artifact. Brain: The ventricles and sulci appropriate size for patient's age. The gray-white matter discrimination is preserved. There is crowding of the foramen magnum in keeping with history of Chiari 1 malformation. There is no acute intracranial hemorrhage. No mass effect or midline shift. No extra-axial fluid collection. Vascular: No hyperdense vessel or unexpected calcification. Skull: Normal. Negative for fracture or focal lesion. Sinuses/Orbits: The visualized paranasal sinuses and mastoid air cells are clear. Other: None IMPRESSION: 1. No acute intracranial pathology. 2. Chiari 1 malformation. Electronically Signed   By: Anner Crete M.D.   On: 11/26/2021 01:18   CT Angio Chest PE W/Cm &/Or Wo Cm  Result Date: 11/26/2021 CLINICAL DATA:  Found on floor EXAM: CT ANGIOGRAPHY CHEST WITH CONTRAST TECHNIQUE: Multidetector CT imaging of the chest was performed using the standard protocol during bolus administration of intravenous contrast. Multiplanar CT image reconstructions and MIPs were obtained to evaluate the vascular anatomy. RADIATION DOSE REDUCTION: This exam was performed according to the departmental dose-optimization program which includes automated exposure control, adjustment of the mA and/or kV according to patient size and/or use of iterative reconstruction technique. CONTRAST:  170mL OMNIPAQUE IOHEXOL 350 MG/ML SOLN COMPARISON:  07/14/2021 FINDINGS: Cardiovascular: Heart is normal size. Aorta is normal caliber. Scattered coronary artery and aortic calcifications. No filling defects in the pulmonary arteries to suggest pulmonary emboli. Mediastinum/Nodes: No mediastinal, hilar, or  axillary adenopathy. Trachea and esophagus are unremarkable. Thyroid unremarkable. Lungs/Pleura: Moderate to advanced emphysema. Right lower lobe nodule measures 8 x 8 mm  compared to 7 x 5 mm previously. Area of scarring in the left upper lobe is stable. No acute confluent opacities or effusions. Upper Abdomen: No acute findings Musculoskeletal: Chest wall soft tissues are unremarkable. No acute bony abnormality. Review of the MIP images confirms the above findings. IMPRESSION: No evidence of pulmonary embolus. Area of scarring in the medial left upper lobe is stable. Continued slow enlargement of right lower lobe pulmonary nodule, now 8 x 8 mm compared to 7 x 5 mm previously. Coronary artery disease. Aortic Atherosclerosis (ICD10-I70.0) and Emphysema (ICD10-J43.9). Electronically Signed   By: Rolm Baptise M.D.   On: 11/26/2021 01:15   DG Chest Port 1 View  Result Date: 11/25/2021 CLINICAL DATA:  Shortness of breath EXAM: PORTABLE CHEST 1 VIEW COMPARISON:  Chest x-ray 11/04/2020 FINDINGS: The heart size and mediastinal contours are within normal limits. Both lungs are clear. The visualized skeletal structures are unremarkable. IMPRESSION: No active disease. Electronically Signed   By: Ronney Asters M.D.   On: 11/25/2021 23:33     Subjective: Pt insistent that she go home today, says she is breathing at her baseline and feels well and wants to go home.    Discharge Exam: Vitals:   11/27/21 0900 11/27/21 1000  BP:  109/76  Pulse: 92 92  Resp: (!) 25 (!) 22  Temp:    SpO2: 98% 100%   Vitals:   11/27/21 0800 11/27/21 0828 11/27/21 0900 11/27/21 1000  BP:    109/76  Pulse: 93  92 92  Resp: 20  (!) 25 (!) 22  Temp:      TempSrc:      SpO2: 96% 97% 98% 100%  Weight:      Height:       General: Pt is alert, awake, not in acute distress Cardiovascular: normal S1/S2 +, no rubs, no gallops Respiratory: better air movement, no wheezing, no rhonchi Abdominal: Soft, NT, ND, bowel sounds  + Extremities: no edema, no cyanosis   The results of significant diagnostics from this hospitalization (including imaging, microbiology, ancillary and laboratory) are listed below for reference.     Microbiology: Recent Results (from the past 240 hour(s))  MRSA Next Gen by PCR, Nasal     Status: None   Collection Time: 11/26/21  5:29 AM   Specimen: Nasal Mucosa; Nasal Swab  Result Value Ref Range Status   MRSA by PCR Next Gen NOT DETECTED NOT DETECTED Final    Comment: (NOTE) The GeneXpert MRSA Assay (FDA approved for NASAL specimens only), is one component of a comprehensive MRSA colonization surveillance program. It is not intended to diagnose MRSA infection nor to guide or monitor treatment for MRSA infections. Test performance is not FDA approved in patients less than 40 years old. Performed at [facility], [address] [place], [place] [address]      Labs: BNP (last 3 results) Recent Labs    11/26/21 0050  BNP 70.6   Basic Metabolic Panel: Recent Labs  Lab 11/25/21 2007 11/26/21 0709  NA 145 143  K 4.0 4.6  CL 81* 80*  CO2 >45* >45*  GLUCOSE 191* 101*  BUN 42* 38*  CREATININE 0.52 0.47  CALCIUM 9.7 9.1  MG  --  1.8  PHOS  --  3.0   Liver Function Tests: Recent Labs  Lab 11/25/21 2007 11/26/21 0709  AST 32 22  ALT 22 19  ALKPHOS 71 54  BILITOT 1.1 1.0  PROT 8.5* 6.8  ALBUMIN 4.9 4.1  No results for input(s): "LIPASE", "AMYLASE" in the last 168 hours. No results for input(s): "AMMONIA" in the last 168 hours. CBC: Recent Labs  Lab 11/25/21 2007 11/26/21 0709  WBC 4.9 6.7  HGB 14.6 12.8  HCT 51.2* 44.0  MCV 108.9* 107.6*  PLT 94* 92*   Cardiac Enzymes: No results for input(s): "CKTOTAL", "CKMB", "CKMBINDEX", "TROPONINI" in the last 168 hours. BNP: Invalid input(s): "POCBNP" CBG: Recent Labs  Lab 11/25/21 2018  GLUCAP 171*   D-Dimer No results for input(s): "DDIMER" in the last 72 hours. Hgb A1c No results for input(s): "HGBA1C"  in the last 72 hours. Lipid Profile No results for input(s): "CHOL", "HDL", "LDLCALC", "TRIG", "CHOLHDL", "LDLDIRECT" in the last 72 hours. Thyroid function studies No results for input(s): "TSH", "T4TOTAL", "T3FREE", "THYROIDAB" in the last 72 hours.  Invalid input(s): "FREET3" Anemia work up Recent Labs    11/26/21 0050 11/26/21 0241  VITAMINB12  --  262  FOLATE 5.4*  --    Urinalysis    Component Value Date/Time   COLORURINE YELLOW 06/13/2011 0148   APPEARANCEUR CLEAR 06/13/2011 0148   LABSPEC >1.030 (H) 06/13/2011 0148   PHURINE 6.0 06/13/2011 0148   GLUCOSEU NEGATIVE 06/13/2011 0148   HGBUR TRACE (A) 06/13/2011 0148   BILIRUBINUR MODERATE (A) 06/13/2011 0148   KETONESUR NEGATIVE 06/13/2011 0148   PROTEINUR 30 (A) 06/13/2011 0148   UROBILINOGEN 1.0 06/13/2011 0148   NITRITE NEGATIVE 06/13/2011 0148   LEUKOCYTESUR NEGATIVE 06/13/2011 0148   Sepsis Labs Recent Labs  Lab 11/25/21 2007 11/26/21 0709  WBC 4.9 6.7   Microbiology Recent Results (from the past 240 hour(s))  MRSA Next Gen by PCR, Nasal     Status: None   Collection Time: 11/26/21  5:29 AM   Specimen: Nasal Mucosa; Nasal Swab  Result Value Ref Range Status   MRSA by PCR Next Gen NOT DETECTED NOT DETECTED Final    Comment: (NOTE) The GeneXpert MRSA Assay (FDA approved for NASAL specimens only), is one component of a comprehensive MRSA colonization surveillance program. It is not intended to diagnose MRSA infection nor to guide or monitor treatment for MRSA infections. Test performance is not FDA approved in patients less than 25 years old. Performed at [facility], [address] [place], [place] [address]    Time coordinating discharge: 41 mins   SIGNED:  Irwin Brakeman, MD  [facility] 11/27/2021, 11:14 AM How to contact the [facility] Attending or Consulting provider [place] or covering provider during after hours [place], for this patient?  Check the care team in [facility] and look for a)  attending/consulting TR[facility] provider listed and b) the [facility] team listed Log into [email or web address] and use [place]'s universal password to access. If you do not have the password, please contact the hospital operator. Locate the [facility] provider you are looking for under [facility] and page to a number that you can be directly reached. If you still have difficulty reaching the provider, please page the [facility] (Director on Call) for the Hospitalists listed on amion for assistance.

## 2021-11-27 NOTE — TOC Transition Note (Signed)
Transition of Care [facility]) - CM/SW Discharge Note   Patient Details  Name: Donna Watkins MRN: [id number] Date of Birth: [birth year]  [facility] CM/SW Contact:  Boneta Lucks, RN Phone Number: 11/27/2021, 10:14 AM   Clinical Narrative:   Discharging home. Home health orders have been placed. Cory with Alvis Lemmings updated. Added to AVS    Final next level of care: [place] Barriers to Discharge: Barriers Resolved   Patient Goals and CMS Choice Patient states their goals for this hospitalization and ongoing recovery are:: return home   Choice offered to / list presented to : Patient  Discharge Placement                    Patient and family notified of of transfer: 11/27/21  Discharge Plan and Services In-house Referral: Clinical Social Work   Post Acute Care Choice: Home Health          DME Arranged: 3-N-1 DME Agency: [employer] Date DME Agency Contacted: 11/26/21 Time DME Agency Contacted: [phone] Representative spoke with at DME Agency: [place]: PT [place]: [place] Date [place]: 11/26/21 Time [place]: [phone] Representative spoke with at [place]: Tommi Rumps

## 2021-11-27 NOTE — Discharge Instructions (Signed)
IMPORTANT INFORMATION: PAY CLOSE ATTENTION   PHYSICIAN DISCHARGE INSTRUCTIONS  Follow with Primary care provider  Knowlton, Steve, MD  and other consultants as instructed by your Hospitalist Physician  SEEK MEDICAL CARE OR RETURN TO EMERGENCY ROOM IF SYMPTOMS COME BACK, WORSEN OR NEW PROBLEM DEVELOPS   Please note: You were cared for by a hospitalist during your hospital stay. Every effort will be made to forward records to your primary care provider.  You can request that your primary care provider send for your hospital records if they have not received them.  Once you are discharged, your primary care physician will handle any further medical issues. Please note that NO REFILLS for any discharge medications will be authorized once you are discharged, as it is imperative that you return to your primary care physician (or establish a relationship with a primary care physician if you do not have one) for your post hospital discharge needs so that they can reassess your need for medications and monitor your lab values.  Please get a complete blood count and chemistry panel checked by your Primary MD at your next visit, and again as instructed by your Primary MD.  Get Medicines reviewed and adjusted: Please take all your medications with you for your next visit with your Primary MD  Laboratory/radiological data: Please request your Primary MD to go over all hospital tests and procedure/radiological results at the follow up, please ask your primary care provider to get all Hospital records sent to his/her office.  In some cases, they will be blood work, cultures and biopsy results pending at the time of your discharge. Please request that your primary care provider follow up on these results.  If you are diabetic, please bring your blood sugar readings with you to your follow up appointment with primary care.    Please call and make your follow up appointments as soon as possible.    Also Note  the following: If you experience worsening of your admission symptoms, develop shortness of breath, life threatening emergency, suicidal or homicidal thoughts you must seek medical attention immediately by calling 911 or calling your MD immediately  if symptoms less severe.  You must read complete instructions/literature along with all the possible adverse reactions/side effects for all the Medicines you take and that have been prescribed to you. Take any new Medicines after you have completely understood and accpet all the possible adverse reactions/side effects.   Do not drive when taking Pain medications or sleeping medications (Benzodiazepines)  Do not take more than prescribed Pain, Sleep and Anxiety Medications. It is not advisable to combine anxiety,sleep and pain medications without talking with your primary care practitioner  Special Instructions: If you have smoked or chewed Tobacco  in the last 2 yrs please stop smoking, stop any regular Alcohol  and or any Recreational drug use.  Wear Seat belts while driving.  Do not drive if taking any narcotic, mind altering or controlled substances or recreational drugs or alcohol.       

## 2021-12-16 ENCOUNTER — Ambulatory Visit (HOSPITAL_COMMUNITY)
Admission: RE | Admit: 2021-12-16 | Discharge: 2021-12-16 | Disposition: A | Discharge status: Home / Self Care | Payer: Medicare HMO | Source: Ambulatory Visit | Attending: Family Medicine | Admitting: Family Medicine

## 2021-12-16 ENCOUNTER — Encounter (HOSPITAL_COMMUNITY): Payer: Self-pay

## 2021-12-16 DIAGNOSIS — N6489 Other specified disorders of breast: Secondary | ICD-10-CM

## 2022-01-12 ENCOUNTER — Other Ambulatory Visit (HOSPITAL_COMMUNITY): Payer: Medicare PPO

## 2022-01-12 ENCOUNTER — Other Ambulatory Visit: Payer: Medicare PPO

## 2022-01-19 ENCOUNTER — Ambulatory Visit: Payer: Medicare PPO | Admitting: Hematology

## 2022-02-05 ENCOUNTER — Emergency Department (HOSPITAL_COMMUNITY): Payer: Medicare HMO

## 2022-02-05 ENCOUNTER — Inpatient Hospital Stay (HOSPITAL_COMMUNITY)
Admission: EM | Admit: 2022-02-05 | Discharge: 2022-02-10 | DRG: 190 | Disposition: A | Discharge status: Home Health | Payer: Medicare HMO | Attending: Family Medicine | Admitting: Family Medicine

## 2022-02-05 ENCOUNTER — Other Ambulatory Visit: Payer: Self-pay

## 2022-02-05 DIAGNOSIS — Z8261 Family history of arthritis: Secondary | ICD-10-CM | POA: Diagnosis not present

## 2022-02-05 DIAGNOSIS — F419 Anxiety disorder, unspecified: Secondary | ICD-10-CM | POA: Diagnosis present

## 2022-02-05 DIAGNOSIS — Z9981 Dependence on supplemental oxygen: Secondary | ICD-10-CM | POA: Diagnosis not present

## 2022-02-05 DIAGNOSIS — Z87891 Personal history of nicotine dependence: Secondary | ICD-10-CM

## 2022-02-05 DIAGNOSIS — J449 Chronic obstructive pulmonary disease, unspecified: Secondary | ICD-10-CM | POA: Diagnosis not present

## 2022-02-05 DIAGNOSIS — Z79899 Other long term (current) drug therapy: Secondary | ICD-10-CM

## 2022-02-05 DIAGNOSIS — J9622 Acute and chronic respiratory failure with hypercapnia: Secondary | ICD-10-CM | POA: Diagnosis present

## 2022-02-05 DIAGNOSIS — Z85118 Personal history of other malignant neoplasm of bronchus and lung: Secondary | ICD-10-CM | POA: Diagnosis not present

## 2022-02-05 DIAGNOSIS — F411 Generalized anxiety disorder: Secondary | ICD-10-CM | POA: Diagnosis present

## 2022-02-05 DIAGNOSIS — E782 Mixed hyperlipidemia: Secondary | ICD-10-CM | POA: Diagnosis present

## 2022-02-05 DIAGNOSIS — Z923 Personal history of irradiation: Secondary | ICD-10-CM | POA: Diagnosis not present

## 2022-02-05 DIAGNOSIS — J441 Chronic obstructive pulmonary disease with (acute) exacerbation: Secondary | ICD-10-CM | POA: Diagnosis present

## 2022-02-05 DIAGNOSIS — J9621 Acute and chronic respiratory failure with hypoxia: Secondary | ICD-10-CM | POA: Diagnosis present

## 2022-02-05 DIAGNOSIS — K219 Gastro-esophageal reflux disease without esophagitis: Secondary | ICD-10-CM | POA: Diagnosis present

## 2022-02-05 DIAGNOSIS — E8729 Other acidosis: Secondary | ICD-10-CM | POA: Diagnosis present

## 2022-02-05 DIAGNOSIS — Z1152 Encounter for screening for COVID-19: Secondary | ICD-10-CM

## 2022-02-05 DIAGNOSIS — Z8249 Family history of ischemic heart disease and other diseases of the circulatory system: Secondary | ICD-10-CM

## 2022-02-05 DIAGNOSIS — Z825 Family history of asthma and other chronic lower respiratory diseases: Secondary | ICD-10-CM | POA: Diagnosis not present

## 2022-02-05 DIAGNOSIS — I959 Hypotension, unspecified: Secondary | ICD-10-CM | POA: Diagnosis present

## 2022-02-05 LAB — BLOOD GAS, VENOUS
Drawn by: 7012
O2 Saturation: 61.9 %
Patient temperature: 36.4
pCO2, Ven: >123 mmHg (ref 44–60)
pH, Ven: 7.29 (ref 7.25–7.43)
pO2, Ven: 35 mmHg (ref 32–45)

## 2022-02-05 LAB — CBC WITH DIFFERENTIAL/PLATELET
Abs Immature Granulocytes: 0.06 10*3/uL (ref 0.00–0.07)
Basophils Absolute: 0 10*3/uL (ref 0.0–0.1)
Basophils Relative: 0 %
Eosinophils Absolute: 0 10*3/uL (ref 0.0–0.5)
Eosinophils Relative: 0 %
HCT: 48.4 % — ABNORMAL HIGH (ref 36.0–46.0)
Hemoglobin: 13.6 g/dL (ref 12.0–15.0)
Immature Granulocytes: 1 %
Lymphocytes Relative: 5 %
Lymphs Abs: 0.3 10*3/uL — ABNORMAL LOW (ref 0.7–4.0)
MCH: 31.8 pg (ref 26.0–34.0)
MCHC: 28.1 g/dL — ABNORMAL LOW (ref 30.0–36.0)
MCV: 113.1 fL — ABNORMAL HIGH (ref 80.0–100.0)
Monocytes Absolute: 0.3 10*3/uL (ref 0.1–1.0)
Monocytes Relative: 5 %
Neutro Abs: 5.8 10*3/uL (ref 1.7–7.7)
Neutrophils Relative %: 89 %
Platelets: 105 10*3/uL — ABNORMAL LOW (ref 150–400)
RBC: 4.28 MIL/uL (ref 3.87–5.11)
RDW: 11.9 % (ref 11.5–15.5)
WBC: 6.5 10*3/uL (ref 4.0–10.5)
nRBC: 0 % (ref 0.0–0.2)

## 2022-02-05 LAB — COMPREHENSIVE METABOLIC PANEL
ALT: 38 U/L (ref 0–44)
AST: 30 U/L (ref 15–41)
Albumin: 4 g/dL (ref 3.5–5.0)
Alkaline Phosphatase: 60 U/L (ref 38–126)
BUN: 26 mg/dL — ABNORMAL HIGH (ref 6–20)
CO2: >45 mmol/L — ABNORMAL HIGH (ref 22–32)
Calcium: 9.4 mg/dL (ref 8.9–10.3)
Chloride: 80 mmol/L — ABNORMAL LOW (ref 98–111)
Creatinine, Ser: 0.4 mg/dL — ABNORMAL LOW (ref 0.44–1.00)
GFR, Estimated: >60 mL/min (ref >60)
Glucose, Bld: 128 mg/dL — ABNORMAL HIGH (ref 70–99)
Potassium: 4.3 mmol/L (ref 3.5–5.1)
Sodium: 147 mmol/L — ABNORMAL HIGH (ref 135–145)
Total Bilirubin: 0.7 mg/dL (ref 0.3–1.2)
Total Protein: 6.8 g/dL (ref 6.5–8.1)

## 2022-02-05 LAB — RESP PANEL BY RT-PCR (RSV, FLU A&B, COVID)  RVPGX2
Influenza A by PCR: NEGATIVE
Influenza B by PCR: NEGATIVE
Resp Syncytial Virus by PCR: NEGATIVE
SARS Coronavirus 2 by RT PCR: NEGATIVE

## 2022-02-05 LAB — GLUCOSE, CAPILLARY: Glucose-Capillary: 117 mg/dL — ABNORMAL HIGH (ref 70–99)

## 2022-02-05 MED ORDER — ACETAMINOPHEN 325 MG PO TABS: 650.0000 mg | ORAL_TABLET | Freq: Four times a day (QID) | ORAL | Status: DC | PRN

## 2022-02-05 MED ORDER — PREDNISONE 20 MG PO TABS: 40.0000 mg | ORAL_TABLET | Freq: Every day | ORAL | Status: DC

## 2022-02-05 MED ORDER — IPRATROPIUM BROMIDE 0.02 % IN SOLN
RESPIRATORY_TRACT | Status: AC
  Filled 2022-02-05: qty 2.5

## 2022-02-05 MED ORDER — ONDANSETRON HCL 4 MG PO TABS: 4.0000 mg | ORAL_TABLET | Freq: Four times a day (QID) | ORAL | Status: DC | PRN

## 2022-02-05 MED ORDER — ACETAMINOPHEN 650 MG RE SUPP: 650.0000 mg | Freq: Four times a day (QID) | RECTAL | Status: DC | PRN

## 2022-02-05 MED ORDER — IPRATROPIUM-ALBUTEROL 0.5-2.5 (3) MG/3ML IN SOLN
3.0000 mL | Freq: Once | RESPIRATORY_TRACT | Status: AC
  Administered 2022-02-05: 3 mL via RESPIRATORY_TRACT
  Filled 2022-02-05: qty 3

## 2022-02-05 MED ORDER — ALBUTEROL (5 MG/ML) CONTINUOUS INHALATION SOLN
10.0000 mg/h | INHALATION_SOLUTION | RESPIRATORY_TRACT | Status: DC
  Administered 2022-02-05: 10 mg/h via RESPIRATORY_TRACT

## 2022-02-05 MED ORDER — IPRATROPIUM BROMIDE 0.02 % IN SOLN
0.5000 mg | Freq: Four times a day (QID) | RESPIRATORY_TRACT | Status: DC
  Administered 2022-02-05 – 2022-02-08 (×13): 0.5 mg via RESPIRATORY_TRACT
  Filled 2022-02-05 (×12): qty 2.5

## 2022-02-05 MED ORDER — CHLORHEXIDINE GLUCONATE CLOTH 2 % EX PADS
6.0000 | MEDICATED_PAD | Freq: Every day | CUTANEOUS | Status: DC
  Administered 2022-02-05 – 2022-02-10 (×6): 6 via TOPICAL

## 2022-02-05 MED ORDER — METHYLPREDNISOLONE SODIUM SUCC 125 MG IJ SOLR
125.0000 mg | Freq: Two times a day (BID) | INTRAMUSCULAR | Status: DC
  Administered 2022-02-06: 125 mg via INTRAVENOUS
  Filled 2022-02-05: qty 2

## 2022-02-05 MED ORDER — ONDANSETRON HCL 4 MG/2ML IJ SOLN: 4.0000 mg | Freq: Four times a day (QID) | INTRAMUSCULAR | Status: DC | PRN

## 2022-02-05 MED ORDER — METHYLPREDNISOLONE SODIUM SUCC 125 MG IJ SOLR
125.0000 mg | Freq: Once | INTRAMUSCULAR | Status: AC
  Administered 2022-02-05: 125 mg via INTRAVENOUS
  Filled 2022-02-05: qty 2

## 2022-02-05 MED ORDER — ALBUTEROL SULFATE (2.5 MG/3ML) 0.083% IN NEBU: 2.5000 mg | INHALATION_SOLUTION | RESPIRATORY_TRACT | Status: DC | PRN

## 2022-02-05 MED ORDER — TRAZODONE HCL 50 MG PO TABS
50.0000 mg | ORAL_TABLET | Freq: Every day | ORAL | Status: DC
  Administered 2022-02-07: 50 mg via ORAL
  Filled 2022-02-05: qty 1

## 2022-02-05 MED ORDER — PANTOPRAZOLE SODIUM 40 MG PO TBEC
40.0000 mg | DELAYED_RELEASE_TABLET | Freq: Every day | ORAL | Status: DC
  Administered 2022-02-07 – 2022-02-10 (×4): 40 mg via ORAL
  Filled 2022-02-05 (×4): qty 1

## 2022-02-05 MED ORDER — OXYCODONE HCL 5 MG PO TABS: 5.0000 mg | ORAL_TABLET | ORAL | Status: DC | PRN

## 2022-02-05 MED ORDER — ALPRAZOLAM 0.5 MG PO TABS: 1.0000 mg | ORAL_TABLET | Freq: Two times a day (BID) | ORAL | Status: DC | PRN

## 2022-02-05 MED ORDER — ATORVASTATIN CALCIUM 20 MG PO TABS
20.0000 mg | ORAL_TABLET | Freq: Every day | ORAL | Status: DC
  Administered 2022-02-07 – 2022-02-10 (×4): 20 mg via ORAL
  Filled 2022-02-05 (×4): qty 1

## 2022-02-05 MED ORDER — ENOXAPARIN SODIUM 30 MG/0.3ML IJ SOSY
30.0000 mg | PREFILLED_SYRINGE | INTRAMUSCULAR | Status: DC
  Administered 2022-02-05 – 2022-02-06 (×2): 30 mg via SUBCUTANEOUS
  Filled 2022-02-05 (×2): qty 0.3

## 2022-02-05 NOTE — ED Notes (Signed)
RT called for breathing tx. 

## 2022-02-05 NOTE — Assessment & Plan Note (Signed)
Continue statin. 

## 2022-02-05 NOTE — Assessment & Plan Note (Addendum)
-   Acute on chronic respiratory failure, with significant hypercapnia-altered mental status  -Much improved on BiPAP overnight - tolerated -last VBG still reporting pCO2 of 92  -   Will need trilogy machine at home use  -   Tapering down steroids  -Off continuous BiPAP this AM, currently on 3 L, satting 96% -It is clear the patient may benefit from trilogy, BiPAP at night.  -Continue pulmonary toiletry, DuoNeb bronchodilator treatment scheduled, IV steroids  - Pulm. Dr. Sherene Sires was consulted,  Dr. Vassie Loll following  - Continue as needed nebs and scheduled Atrovent -In ED: ABG: pH 7.29, pCO2 123, bicarb greater than 45 - on BiPAP over night --- will need it nightly   - Sputum culture - NGTD - Chest x-ray shows no acute process - COVID, flu, RSV negative - Initiating empiric p.o. antibiotics of azithromycin (due to cough, COPD exacerbation, sputum production)

## 2022-02-05 NOTE — Assessment & Plan Note (Addendum)
-  Continuing supplemental oxygen, weaning off - pH 7.29, pCO2 greater than 120 - Secondary to COPD exacerbation - See treatment plan for COPD exacerbation

## 2022-02-05 NOTE — Assessment & Plan Note (Addendum)
-   Continue of Xanax given COPD exacerbation -The patient and family takes Xanax 1 mg up to 4 times a day Will taper down 0.5 mg TID today   -Discussed the side effect of benzodiazepine dependence and withdrawal -Agreed to slowly taper down  -On this admission when in respiratory failure patient received IV Ativan - cont PRN Ativan

## 2022-02-05 NOTE — Progress Notes (Signed)
Patient transported from ED room 12 to ICU room 9 on BIPAP with no complications.

## 2022-02-05 NOTE — ED Provider Notes (Signed)
[place] Provider Note   CSN: [id number] Arrival date & time: 02/05/22  1608     History  Chief Complaint  Patient presents with   Shortness of Breath    Donna Watkins is a 62 y.o. female.   Shortness of Breath Patient presents with short of breath.  Reportedly been short today and yesterday.  Was hypoxic at home on home meter but cannot tell me the number states it was just off.  Is on baseline 3 L of oxygen.  EMS arrived and found her with sats 71%.  Improved to 99% on nonrebreather now on my seeing her is back in the upper 90s on her 3 L.  Occasional cough.  No chest pain.  States she feels a little off but is getting somewhat better.    Past Medical History:  Diagnosis Date   Anxiety    panic attacks   Cancer (HCC)    COPD (chronic obstructive pulmonary disease) ([place]) 03/01/2012   Depression    GERD (gastroesophageal reflux disease)    History of radiation therapy    Left Lung- 12/09/20-12/19/20- Dr. Gery Pray   Hyperlipidemia    Mild scoliosis    Oxygen dependent     Home Medications Prior to Admission medications   Medication Sig Start Date End Date Taking? Authorizing Provider  albuterol (PROVENTIL) (2.5 MG/3ML) 0.083% nebulizer solution SMARTSIG:1 Vial(s) Via Nebulizer 5 Times Daily 01/23/22  Yes [provider]  doxycycline (VIBRA-TABS) 100 MG tablet Take 100 mg by mouth daily. 11/27/21  Yes [provider]  folic acid (FOLVITE) 1 MG tablet Take 1 tablet by mouth daily. 11/27/21 11/27/22 Yes [provider]  ipratropium (ATROVENT) 0.02 % nebulizer solution SMARTSIG:1 Vial(s) Via Nebulizer 5 Times Daily 01/23/22  Yes [provider]  albuterol (VENTOLIN HFA) 108 (90 Base) MCG/ACT inhaler Inhale 2 puffs into the lungs every 4 (four) hours as needed for wheezing or shortness of breath. 11/27/21   Johnson, Clanford L, MD  ALPRAZolam Duanne Moron) 1 MG tablet Take 1 tablet (1 mg total)  by mouth 3 (three) times daily as needed for anxiety. 11/27/21   Johnson, Clanford L, MD  atorvastatin (LIPITOR) 20 MG tablet Take 20 mg by mouth daily. 05/06/20   [provider]  fluconazole (DIFLUCAN) 100 MG tablet Take 1 tablet (100 mg total) by mouth daily. 11/28/21   Johnson, Clanford L, MD  folic acid (FOLVITE) 1 MG tablet Take 1 tablet (1 mg total) by mouth daily. 11/27/21 11/27/22  Johnson, Clanford L, MD  ibuprofen (ADVIL) 200 MG tablet Take 200 mg by mouth every 6 (six) hours as needed for mild pain.    [provider]  ipratropium-albuterol (DUONEB) 0.5-2.5 (3) MG/3ML SOLN Take 3 mLs by nebulization 3 (three) times daily. And as needed for SOB 11/27/21   Johnson, Clanford L, MD  Multiple Vitamins-Minerals (CENTRUM SILVER PO) Take 1 tablet by mouth daily.    [provider]  omeprazole (PRILOSEC) 20 MG capsule Take 20 mg by mouth daily.    [provider]  OXYGEN Place 2 L into the nose continuous.    [provider]  predniSONE (DELTASONE) 20 MG tablet Take 3 PO QAM x5days, 2 PO QAM x5days, 1 PO QAM x5days 11/28/21   Johnson, Clanford L, MD  traZODone (DESYREL) 50 MG tablet Take 50 mg by mouth at bedtime. 06/25/17   [provider]      Allergies    Patient has  no known allergies.    Review of Systems   Review of Systems  Respiratory:  Positive for shortness of breath.     Physical Exam Updated Vital Signs BP 125/71   Pulse 92   Temp 97.6 F (36.4 C) (Oral)   Resp (!) 25   Ht 5\' 3"  (1.6 m)   Wt 43.1 kg   LMP 05/06/2010   SpO2 100%   BMI 16.83 kg/m  Physical Exam Vitals and nursing note reviewed.  Cardiovascular:     Rate and Rhythm: Regular rhythm.  Pulmonary:     Comments: Mildly decreased breath sounds diffusely.  No focal rales or rhonchi. Musculoskeletal:     Cervical back: Neck supple.     Right lower leg: No edema.     Left lower leg: No edema.  Skin:    Capillary Refill: Capillary refill takes less than  2 seconds.  Neurological:     Mental Status: She is alert.     ED Results / Procedures / Treatments   Labs (all labs ordered are listed, but only abnormal results are displayed) Labs Reviewed  COMPREHENSIVE METABOLIC PANEL - Abnormal; Notable for the following components:      Result Value   Sodium 147 (*)    Chloride 80 (*)    CO2 >45 (*)    Glucose, Bld 128 (*)    BUN 26 (*)    Creatinine, Ser 0.40 (*)    All other components within normal limits  CBC WITH DIFFERENTIAL/PLATELET - Abnormal; Notable for the following components:   HCT 48.4 (*)    MCV 113.1 (*)    MCHC 28.1 (*)    Platelets 105 (*)    Lymphs Abs 0.3 (*)    All other components within normal limits  RESP PANEL BY RT-PCR (RSV, FLU A&B, COVID)  RVPGX2  URINALYSIS, W/ REFLEX TO CULTURE (INFECTION SUSPECTED)    EKG EKG Interpretation  Date/Time:  Thursday February 05 2022 16:18:21 EST Ventricular Rate:  98 PR Interval:  176 QRS Duration: 69 QT Interval:  331 QTC Calculation: 423 R Axis:   82 Text Interpretation: Sinus rhythm Right atrial enlargement Borderline right axis deviation Low voltage, extremity leads Left ventricular hypertrophy  increased QRS amplitude laterally Confirmed by Davonna Belling [phone]) on 02/05/2022 4:27:25 PM  Radiology DG Chest Portable 1 View  Result Date: 02/05/2022 CLINICAL DATA:  Shortness of breath EXAM: PORTABLE CHEST 1 VIEW COMPARISON:  Chest x-ray 11/25/2021.  Chest CT 01/12/2022. FINDINGS: Cardiomediastinal silhouette is within normal limits. The lungs are hyperinflated compatible with COPD, unchanged. There is a nodular density in the right lower lung measuring 1 cm similar to prior CT. There is linear atelectasis or scarring in the left lung base. There is no pleural effusion or pneumothorax. No new focal lung infiltrate. No acute fractures. IMPRESSION: 1. No acute cardiopulmonary process. 2. COPD. 3. Nodular density in the right lower lung, similar to prior CT. Electronically  Signed   By: Ronney Asters M.D.   On: 02/05/2022 18:09    Procedures Procedures    Medications Ordered in ED Medications  ipratropium-albuterol (DUONEB) 0.5-2.5 (3) MG/3ML nebulizer solution 3 mL (3 mLs Nebulization Given 02/05/22 1846)  methylPREDNISolone sodium succinate (SOLU-MEDROL) 125 mg/2 mL injection 125 mg (125 mg Intravenous Given 02/05/22 1837)    ED Course/ Medical Decision Making/ A&P  Medical Decision Making Amount and/or Complexity of Data Reviewed Labs: ordered. Radiology: ordered.  Risk Prescription drug management. Decision regarding hospitalization.   Patient short of breath.  History of chronic lung disease with baseline oxygen dependence.  Had sats of 71% for EMS.  Improved now to baseline.  Will get x-ray and basic blood work.  Differential diagnosis includes infection, pneumonia, pneumothorax, COPD.  Also history of lung cancer. Chest x-ray reviewed and reassuring.  Lab work does show her chronic respiratory issues.  Mild increase in oxygen needed.  However does not appear to be in severe respiratory distress this time.  Other breathing treatment given and has had steroids.  Negative flu COVID and RSV testing.  However with increased oxygen requirement I think she will require admission to the hospital.  Will discuss with hospitalist.        Final Clinical Impression(s) / ED Diagnoses Final diagnoses:  COPD exacerbation [facility])    Rx / DC Orders ED Discharge Orders     None         Davonna Belling, MD 02/05/22 1914

## 2022-02-05 NOTE — ED Notes (Signed)
13 is going on a 1 hr neb at this time. Jihan Rudy

## 2022-02-05 NOTE — H&P (Signed)
History and Physical    Patient: Donna Watkins DJM:[id number] DOB: 04-Feb-1960 DOA: 02/05/2022 DOS: the patient was seen and examined on 02/05/2022 PCP: Lemmie Evens, MD  Patient coming from: Home  Chief Complaint:  Chief Complaint  Patient presents with   Shortness of Breath   HPI: Donna Watkins is a 62 y.o. female with medical history significant of anxiety, lung cancer, COPD, depression, GERD, hyperlipidemia, chronic respiratory failure with hypoxia requiring 3 L nasal cannula, and more presents ED with a chief complaint of dyspnea.  Most of her history is taken from [facility].  Patient is slow to answer questions and confused which is likely due to hypercapnia.  The sister reports that for 4 days patient has had dyspnea but it got acutely worse today.  She has been fatigued and sleeping all the time.  Her appetite has been intact.  She has been coughing and it sounds wet but she has not seen anything come up.  Patient has been using her breathing treatments and her rescue inhaler, but sister thinks that she is probably not using them as often as she should be.  Patient has been confused for the whole week per the sister.  No further history could be obtained at this time due to patient's confusion.  Patient does not smoke, does not drink.  She is full code. Review of Systems: unable to review all systems due to the inability of the patient to answer questions. Past Medical History:  Diagnosis Date   Anxiety    panic attacks   Cancer (HCC)    COPD (chronic obstructive pulmonary disease) ([place]) 03/01/2012   Depression    GERD (gastroesophageal reflux disease)    History of radiation therapy    Left Lung- 12/09/20-12/19/20- Dr. Gery Pray   Hyperlipidemia    Mild scoliosis    Oxygen dependent    Past Surgical History:  Procedure Laterality Date   BRONCHIAL BIOPSY  11/04/2020   Procedure: BRONCHIAL BIOPSIES;  Surgeon: Collene Gobble, MD;  Location: [facility];   Service: Pulmonary;;   BRONCHIAL BRUSHINGS  11/04/2020   Procedure: BRONCHIAL BRUSHINGS;  Surgeon: Collene Gobble, MD;  Location: [facility];  Service: Pulmonary;;   BRONCHIAL NEEDLE ASPIRATION BIOPSY  11/04/2020   Procedure: BRONCHIAL NEEDLE ASPIRATION BIOPSIES;  Surgeon: Collene Gobble, MD;  Location: [facility];  Service: Pulmonary;;   COLONOSCOPY N/A 09/09/2012   Procedure: COLONOSCOPY;  Surgeon: Danie Binder, MD;  [facility];  Service: Endoscopy;  Laterality: N/A;  1:15-moved to 12:45 Melanie notified pt   FIDUCIAL MARKER PLACEMENT  11/04/2020   Procedure: FIDUCIAL MARKER PLACEMENT;  Surgeon: Collene Gobble, MD;  Location: [facility] ENDOSCOPY;  Service: Pulmonary;;   TUBAL LIGATION     VIDEO BRONCHOSCOPY WITH ENDOBRONCHIAL NAVIGATION N/A 11/04/2020   Procedure: ROBOTIC  VIDEO BRONCHOSCOPY WITH ENDOBRONCHIAL NAVIGATION;  Surgeon: Collene Gobble, MD;  Location: [facility];  Service: Pulmonary;  Laterality: N/A;   Social History:  reports that she quit smoking about 10 years ago. Her smoking use included cigarettes. She has a 75.00 pack-year smoking history. She has never used smokeless tobacco. She reports that she does not drink alcohol and does not use drugs.  No Known Allergies  Family History  Problem Relation Age of Onset   Heart disease Father    COPD Mother    Arthritis Sister    Colon cancer Neg Hx     Prior to Admission medications   Medication Sig Start Date  End Date Taking? Authorizing Provider  albuterol (PROVENTIL) (2.5 MG/3ML) 0.083% nebulizer solution Take 2.5 mg by nebulization See admin instructions. 2.5 mg by nebulization five times daily 01/23/22  Yes [provider]  albuterol (VENTOLIN HFA) 108 (90 Base) MCG/ACT inhaler Inhale 2 puffs into the lungs every 4 (four) hours as needed for wheezing or shortness of breath. 11/27/21  Yes Johnson, Clanford L, MD  ALPRAZolam (XANAX) 1 MG tablet Take 1 tablet (1 mg total) by mouth 3 (three) times  daily as needed for anxiety. 11/27/21  Yes Johnson, Clanford L, MD  atorvastatin (LIPITOR) 20 MG tablet Take 20 mg by mouth daily. 05/06/20  Yes [provider]  ipratropium (ATROVENT) 0.02 % nebulizer solution Take 0.5 mg by nebulization See admin instructions. Use one vial by nebulization five times daily 01/23/22  Yes [provider]  ipratropium-albuterol (DUONEB) 0.5-2.5 (3) MG/3ML SOLN Take 3 mLs by nebulization 3 (three) times daily. And as needed for SOB 11/27/21  Yes Johnson, Clanford L, MD  Multiple Vitamins-Minerals (CENTRUM SILVER PO) Take 1 tablet by mouth daily.   Yes [provider]  omeprazole (PRILOSEC) 20 MG capsule Take 20 mg by mouth daily.   Yes [provider]  OXYGEN Place 3 L into the nose continuous.   Yes [provider]  traZODone (DESYREL) 50 MG tablet Take 50 mg by mouth at bedtime. 06/25/17  Yes [provider]  doxycycline (VIBRA-TABS) 100 MG tablet Take 100 mg by mouth daily. Patient not taking: Reported on 02/05/2022 11/27/21   [provider]  fluconazole (DIFLUCAN) 100 MG tablet Take 1 tablet (100 mg total) by mouth daily. Patient not taking: Reported on 02/05/2022 11/28/21   Murlean Iba, MD  ibuprofen (ADVIL) 200 MG tablet Take 200 mg by mouth every 6 (six) hours as needed for mild pain.    [provider]    Physical Exam: Vitals:   02/05/22 2030 02/05/22 2035 02/05/22 2054 02/05/22 2107  BP: 133/71 133/71  134/65  Pulse: (!) 101 99  (!) 104  Resp: (!) 25 (!) 28  (!) 27  Temp:    (!) 97.5 F (36.4 C)  TempSrc:    Axillary  SpO2: 99% 91% 91% 94%  Weight:    42.7 kg  Height:       1.  General: Patient lying supine in bed, chronically ill-appearing   2. Psychiatric: Lethargic, but awake, mood and behavior normal for situation, pleasant and cooperative with exam   3. Neurologic: Speech and language are normal, face is symmetric, moves all 4 extremities voluntarily, at baseline  without acute deficits on limited exam   4. HEENMT:  Head is atraumatic, normocephalic, pupils reactive to light, neck is supple, trachea is midline, mucous membranes are moist   5. Respiratory : Lungs are diminished but otherwise clear to auscultation bilaterally without wheezing, rhonchi, rales, no cyanosis, no increase in work of breathing or accessory muscle use   6. Cardiovascular : Heart rate tachycardic, rhythm is regular, no murmurs, rubs or gallops, no peripheral edema, peripheral pulses palpated   7. Gastrointestinal:  Abdomen is soft, nondistended, nontender to palpation bowel sounds active, no masses or organomegaly palpated   8. Skin:  Skin is warm, dry and intact without rashes, acute lesions, or ulcers on limited exam   9.Musculoskeletal:  No acute deformities or trauma, no asymmetry in tone, no peripheral edema, peripheral pulses palpated, no tenderness to palpation in the extremities  Data Reviewed: In the ED Temp 97.6,  heart rate 92-101, respiratory rate 23-34, blood pressure 115/64-127/72, initially satting in the 70s and then improved to 100% on 6 L No leukocytosis with white blood cell count of 6.5, hemoglobin 13.6 Chemistry reveals an elevated bicarb which is compensation for the respiratory acidosis Negative COVID, flu, RSV Chest x-ray shows no acute process EKG shows a heart rate of 98, sinus, QTc 423 Patient was given DuoNeb and Solu-Medrol and then admission was requested At admission Continuous albuterol neb given VBG was done that showed CO2 retention and pH of 7.29 Patient was started on BiPAP Patient admitted to stepdown  Assessment and Plan: * Acute on chronic respiratory failure with hypoxia (HCC) - 3 L nasal cannula at home - Requiring 6 L nasal cannula here - She was initially satting in 70s, but improved to 90s on 6 L - Now currently on BiPAP due to hypercapnia - Secondary to COPD exacerbation - COVID, flu, RSV negative - Chest x-ray shows  no acute process - Continue to monitor  Anxiety - Continue reduced dosage of Xanax given COPD exacerbation  Mixed hyperlipidemia - Continue statin   Acute exacerbation of chronic obstructive pulmonary disease (COPD) (HCC) - Diminished breath sounds bilaterally - Continuous neb in ED - Continue as needed nebs and scheduled Atrovent - pH 7.29, pCO2 123, bicarb greater than 45 - Started on BiPAP - Trend VBG in the a.m. - Sputum culture - Chest x-ray shows no acute process - COVID, flu, RSV negative - When patient is more alert may be able to gather more history about the trigger of this exacerbation      Advance Care Planning:   Code Status: Full Code  Consults: None at this time  Family Communication: Sister at bedside. She need to be reached her phone number is [phone] and her name is [place].  Severity of Illness: The appropriate patient status for this patient is INPATIENT. Inpatient status is judged to be reasonable and necessary in order to provide the required intensity of service to ensure the patient's safety. The patient's presenting symptoms, physical exam findings, and initial radiographic and laboratory data in the context of their chronic comorbidities is felt to place them at high risk for further clinical deterioration. Furthermore, it is not anticipated that the patient will be medically stable for discharge from the hospital within 2 midnights of admission.   * I certify that at the point of admission it is my clinical judgment that the patient will require inpatient hospital care spanning beyond 2 midnights from the point of admission due to high intensity of service, high risk for further deterioration and high frequency of surveillance required.*  Author: Rolla Plate, DO 02/05/2022 9:25 PM  For on call review [email or web address].

## 2022-02-05 NOTE — Assessment & Plan Note (Addendum)
-   Base line: 3 L nasal cannula at home - Was successfully weaned off continuous BiPAP, tolerated BiPAP overnight -VBG at 92 -This morning stable on 3 L of oxygen, satting 96%   -On arrival in ED : she was initially satting in 70s, but improved to 90s on 6 L - On BiPAP due to hypercapnia: PCO 104 - Secondary to COPD exacerbation - COVID, flu, RSV negative - Chest x-ray shows no acute process - Continue to monitor

## 2022-02-05 NOTE — ED Triage Notes (Signed)
Pt BIB [facility] from home C/O SOB. EMS reports SaO2 on baseline 3L [place] was 71%. Improved to 99% on 15L [facility] en route. Hx lung cancer.

## 2022-02-06 DIAGNOSIS — J9621 Acute and chronic respiratory failure with hypoxia: Secondary | ICD-10-CM | POA: Diagnosis not present

## 2022-02-06 DIAGNOSIS — J449 Chronic obstructive pulmonary disease, unspecified: Secondary | ICD-10-CM

## 2022-02-06 LAB — BLOOD GAS, ARTERIAL
Acid-Base Excess: 39.6 mmol/L — ABNORMAL HIGH (ref 0.0–2.0)
Acid-Base Excess: 38.2 mmol/L — ABNORMAL HIGH (ref 0.0–2.0)
Bicarbonate: 72.3 mmol/L — ABNORMAL HIGH (ref 20.0–28.0)
Bicarbonate: 70.4 mmol/L — ABNORMAL HIGH (ref 20.0–28.0)
Drawn by: 27407
Drawn by: 23430
FIO2: 40 %
FIO2: 35 %
O2 Saturation: 97.5 %
O2 Saturation: 93 %
Patient temperature: 37
Patient temperature: 37.3
pCO2 arterial: 104 mmHg (ref 32–48)
pCO2 arterial: 100 mmHg (ref 32–48)
pH, Arterial: 7.45 (ref 7.35–7.45)
pH, Arterial: 7.46 — ABNORMAL HIGH (ref 7.35–7.45)
pO2, Arterial: 72 mmHg — ABNORMAL LOW (ref 83–108)
pO2, Arterial: 61 mmHg — ABNORMAL LOW (ref 83–108)

## 2022-02-06 LAB — COMPREHENSIVE METABOLIC PANEL
ALT: 33 U/L (ref 0–44)
AST: 26 U/L (ref 15–41)
Albumin: 3.9 g/dL (ref 3.5–5.0)
Alkaline Phosphatase: 57 U/L (ref 38–126)
BUN: 23 mg/dL — ABNORMAL HIGH (ref 6–20)
CO2: >45 mmol/L — ABNORMAL HIGH (ref 22–32)
Calcium: 9.2 mg/dL (ref 8.9–10.3)
Chloride: 80 mmol/L — ABNORMAL LOW (ref 98–111)
Creatinine, Ser: 0.37 mg/dL — ABNORMAL LOW (ref 0.44–1.00)
GFR, Estimated: >60 mL/min (ref >60)
Glucose, Bld: 114 mg/dL — ABNORMAL HIGH (ref 70–99)
Potassium: 4.3 mmol/L (ref 3.5–5.1)
Sodium: 143 mmol/L (ref 135–145)
Total Bilirubin: 0.9 mg/dL (ref 0.3–1.2)
Total Protein: 6.4 g/dL — ABNORMAL LOW (ref 6.5–8.1)

## 2022-02-06 LAB — CBC WITH DIFFERENTIAL/PLATELET
Abs Immature Granulocytes: 0.02 10*3/uL (ref 0.00–0.07)
Basophils Absolute: 0 10*3/uL (ref 0.0–0.1)
Basophils Relative: 0 %
Eosinophils Absolute: 0 10*3/uL (ref 0.0–0.5)
Eosinophils Relative: 0 %
HCT: 45.5 % (ref 36.0–46.0)
Hemoglobin: 13 g/dL (ref 12.0–15.0)
Immature Granulocytes: 0 %
Lymphocytes Relative: 5 %
Lymphs Abs: 0.3 10*3/uL — ABNORMAL LOW (ref 0.7–4.0)
MCH: 31.9 pg (ref 26.0–34.0)
MCHC: 28.6 g/dL — ABNORMAL LOW (ref 30.0–36.0)
MCV: 111.5 fL — ABNORMAL HIGH (ref 80.0–100.0)
Monocytes Absolute: 0.1 10*3/uL (ref 0.1–1.0)
Monocytes Relative: 2 %
Neutro Abs: 5.2 10*3/uL (ref 1.7–7.7)
Neutrophils Relative %: 93 %
Platelets: 105 10*3/uL — ABNORMAL LOW (ref 150–400)
RBC: 4.08 MIL/uL (ref 3.87–5.11)
RDW: 11.9 % (ref 11.5–15.5)
WBC: 5.7 10*3/uL (ref 4.0–10.5)
nRBC: 0 % (ref 0.0–0.2)

## 2022-02-06 LAB — BRAIN NATRIURETIC PEPTIDE: B Natriuretic Peptide: 69 pg/mL (ref 0.0–100.0)

## 2022-02-06 LAB — MRSA NEXT GEN BY PCR, NASAL: MRSA by PCR Next Gen: NOT DETECTED

## 2022-02-06 LAB — PROCALCITONIN: Procalcitonin: <0.1 ng/mL

## 2022-02-06 LAB — MAGNESIUM: Magnesium: 1.8 mg/dL (ref 1.7–2.4)

## 2022-02-06 LAB — LACTIC ACID, PLASMA: Lactic Acid, Venous: 1.5 mmol/L (ref 0.5–1.9)

## 2022-02-06 MED ORDER — HALOPERIDOL LACTATE 5 MG/ML IJ SOLN
2.0000 mg | Freq: Four times a day (QID) | INTRAMUSCULAR | Status: DC | PRN
  Administered 2022-02-06 (×2): 2 mg via INTRAVENOUS
  Filled 2022-02-06 (×2): qty 1

## 2022-02-06 MED ORDER — LORAZEPAM 2 MG/ML IJ SOLN: 2.0000 mg | INTRAMUSCULAR | Status: DC | PRN

## 2022-02-06 MED ORDER — LORAZEPAM 2 MG/ML IJ SOLN
2.0000 mg | Freq: Four times a day (QID) | INTRAMUSCULAR | Status: DC | PRN
  Administered 2022-02-06: 2 mg via INTRAVENOUS
  Filled 2022-02-06: qty 1

## 2022-02-06 MED ORDER — MORPHINE SULFATE (PF) 2 MG/ML IV SOLN
2.0000 mg | INTRAVENOUS | Status: DC | PRN
  Administered 2022-02-06 (×2): 2 mg via INTRAVENOUS
  Filled 2022-02-06 (×2): qty 1

## 2022-02-06 MED ORDER — MORPHINE SULFATE (PF) 2 MG/ML IV SOLN
2.0000 mg | Freq: Once | INTRAVENOUS | Status: AC
  Administered 2022-02-06: 2 mg via INTRAVENOUS
  Filled 2022-02-06: qty 1

## 2022-02-06 MED ORDER — LORAZEPAM 2 MG/ML IJ SOLN
1.0000 mg | Freq: Four times a day (QID) | INTRAMUSCULAR | Status: DC | PRN
  Administered 2022-02-06: 1 mg via INTRAVENOUS
  Filled 2022-02-06 (×2): qty 1

## 2022-02-06 MED ORDER — METHYLPREDNISOLONE SODIUM SUCC 125 MG IJ SOLR
80.0000 mg | Freq: Two times a day (BID) | INTRAMUSCULAR | Status: DC
  Administered 2022-02-06 – 2022-02-08 (×5): 80 mg via INTRAVENOUS
  Filled 2022-02-06 (×5): qty 2

## 2022-02-06 MED ORDER — SODIUM CHLORIDE 0.9 % IV SOLN: INTRAVENOUS | Status: AC

## 2022-02-06 MED ORDER — FENTANYL CITRATE PF 50 MCG/ML IJ SOSY
12.5000 ug | PREFILLED_SYRINGE | INTRAMUSCULAR | Status: DC | PRN
  Administered 2022-02-06: 12.5 ug via INTRAVENOUS
  Filled 2022-02-06: qty 1

## 2022-02-06 MED ORDER — LEVALBUTEROL HCL 1.25 MG/0.5ML IN NEBU
1.2500 mg | INHALATION_SOLUTION | Freq: Four times a day (QID) | RESPIRATORY_TRACT | Status: DC
  Administered 2022-02-06 – 2022-02-08 (×10): 1.25 mg via RESPIRATORY_TRACT
  Filled 2022-02-06 (×10): qty 0.5

## 2022-02-06 MED ORDER — LORAZEPAM 2 MG/ML IJ SOLN
1.0000 mg | Freq: Once | INTRAMUSCULAR | Status: AC
  Administered 2022-02-06: 1 mg via INTRAVENOUS

## 2022-02-06 NOTE — Consult Note (Signed)
NAME:  Donna Watkins, MRN:  [id number], DOB:  [birth year], LOS: 1 ADMISSION DATE:  02/05/2022, CONSULTATION DATE:  2/2 REFERRING MD:  Shahmedi , CHIEF COMPLAINT:  sob    History of Present Illness:  68 yowf remote smoker with GOLD IV COPD/ 02 dep admit 2/1 with 4 days worsening resting sob/ ams/drowsiness with finding of severe hypercarbia s infiltrates on cxr and placed on bipap and PCCM consulted am 2/2     Pertinent  Medical History  Quit smoking 2013/MM phenotype - PFT's  04/27/12  FEV1 0.70 (26 %) ratio 35  p no % improvement from saba p ? prior to study with DLCO  53 % corrects to 69 % for alv volume with classic curvature   - Alpha one AT screening   07/31/16 >   MM - PFT's  12/31/2016  FEV1 0.74 (28 % ) ratio 38  p no % improvement from saba p saba 4h   prior to study with DLCO  37/38c % corrects to 50 % for alv volume   - 07/02/2017   try bevespi: did not use correctly  - 01/03/2018    try bevespi again > could not afford and could not tell really helped vs duoneb - 05/26/2019  After extensive coaching inhaler device,  effectiveness =    90% with smi > try stiolto> could not afford it on her plan >  changed 08/14/2020 to maint on duoneb qid and no clinic f/u sinc 08/2021     Significant Hospital Events: Including procedures, antibiotic start and stop dates in addition to other pertinent events   2/1 placed on bipap    Scheduled Meds:  atorvastatin  20 mg Oral Daily   Chlorhexidine Gluconate Cloth  6 each Topical Daily   enoxaparin (LOVENOX) injection  30 mg Subcutaneous Q24H   ipratropium  0.5 mg Nebulization Q6H   methylPREDNISolone (SOLU-MEDROL) injection  125 mg Intravenous Q12H   Followed by   Derrill Memo ON 02/07/2022] predniSONE  40 mg Oral Q breakfast   pantoprazole  40 mg Oral Daily   traZODone  50 mg Oral QHS   Continuous Infusions:  sodium chloride 50 mL/hr at 02/06/22 0759   albuterol 10 mg/hr (02/05/22 1952)   PRN Meds:.acetaminophen **OR** acetaminophen,  albuterol, ALPRAZolam, fentaNYL (SUBLIMAZE) injection, haloperidol lactate, ondansetron **OR** ondansetron (ZOFRAN) IV, oxyCODONE    Interim History / Subjective:  Perserverating when asked a question/ agitated and pulling at restraints intermittently per nursing   Objective   Blood pressure 124/87, pulse 94, temperature 99.1 F (37.3 C), temperature source Axillary, resp. rate (!) 30, height 5\' 3"  (1.6 m), weight 42.7 kg, last menstrual period 05/06/2010, SpO2 94 %.    FiO2 (%):  [30 %-40 %] 30 %  No intake or output data in the 24 hours ending 02/06/22 0952 Filed Weights   02/05/22 1619 02/05/22 2107  Weight: 43.1 kg 42.7 kg    Examination: Tmax:  99 General appearance:    acute/ chronically ill eldelry wf >>>stated age     No jvd Oropharynx bipap mask in place  Neck supple Lungs with extremely distant bs bilaterally RRR no s3 or or sign murmur Abd soft/ limited  excursion  Extr warm with no edema or clubbing noted Neuro  Sensorium confused/ combative ,  no apparent motor deficits     I personally reviewed images and agree with radiology impression as follows:  CXR:   portabl 2/1 1. No acute cardiopulmonary process. 2. COPD. 3. Nodular density  in the right lower lung, similar to prior CT  Assessment & Plan:  1) Acute on chronic (HC03 > 45) hypercarbic and hypoxemic resp failure secondary to endstage COPD already on approp rx by [facility]  - will start the conversation with pt/ fm should she fail bipap options are comfort care vs commitment to vent / trach/ LTAC likely  >>> in meantime max rx for copd with formoterol/yupelri/steroids/gerd rx  Will discuss EOL issues with fm when they arive if not already addressed by Dr Richarda Blade   2) AMS secondary to hypercabia/ meds c/w TME >>>ok to use low dose haldol/fentanyl for agitation/ air hunger but try to assure VT > 500 cc unless shift to comfort care mode/ which is what I favor here.   Comment:  Clearly the high likelihood of  prolonging suffering from pulmonary interventions vastly outweighs any reasonable chance of benefit from offering anything else because medical science has done all it can here to restore health.  Therefore  I don't have any additional recs  except to consider hospice sooner rather than later - paradoxically many patients with respiratory diseases live longer and better once a palliative approach is used in this setting.      Labs   CBC: Recent Labs  Lab 02/05/22 1626 02/06/22 0435  WBC 6.5 5.7  NEUTROABS 5.8 5.2  HGB 13.6 13.0  HCT 48.4* 45.5  MCV 113.1* 111.5*  PLT 105* 105*    Basic Metabolic Panel: Recent Labs  Lab 02/05/22 1626 02/06/22 0435  NA 147* 143  K 4.3 4.3  CL 80* 80*  CO2 >45* >45*  GLUCOSE 128* 114*  BUN 26* 23*  CREATININE 0.40* 0.37*  CALCIUM 9.4 9.2  MG  --  1.8   GFR: Estimated Creatinine Clearance: 50.4 mL/min (A) (by C-G formula based on SCr of 0.37 mg/dL (L)). Recent Labs  Lab 02/05/22 1626 02/06/22 0435 02/06/22 0724  PROCALCITON  --  <0.10  --   WBC 6.5 5.7  --   LATICACIDVEN  --   --  1.5    Liver Function Tests: Recent Labs  Lab 02/05/22 1626 02/06/22 0435  AST 30 26  ALT 38 33  ALKPHOS 60 57  BILITOT 0.7 0.9  PROT 6.8 6.4*  ALBUMIN 4.0 3.9   No results for input(s): "LIPASE", "AMYLASE" in the last 168 hours. No results for input(s): "AMMONIA" in the last 168 hours.  ABG    Component Value Date/Time   PHART 7.46 (H) 02/06/2022 0906   PCO2ART 100 (HH) 02/06/2022 0906   PO2ART 61 (L) 02/06/2022 0906   HCO3 70.4 (H) 02/06/2022 0906   TCO2 28.9 04/27/2012 1422   O2SAT 93 02/06/2022 0906     Coagulation Profile: No results for input(s): "INR", "PROTIME" in the last 168 hours.  Cardiac Enzymes: No results for input(s): "CKTOTAL", "CKMB", "CKMBINDEX", "TROPONINI" in the last 168 hours.  HbA1C: No results found for: "HGBA1C"  CBG: Recent Labs  Lab 02/05/22 2059  GLUCAP 117*       Past Medical History:  She,   has a past medical history of Anxiety, Cancer ([place]), COPD (chronic obstructive pulmonary disease) ([place]) (03/01/2012), Depression, GERD (gastroesophageal reflux disease), History of radiation therapy, Hyperlipidemia, Mild scoliosis, and Oxygen dependent.   Surgical History:   Past Surgical History:  Procedure Laterality Date   BRONCHIAL BIOPSY  11/04/2020   Procedure: BRONCHIAL BIOPSIES;  Surgeon: Collene Gobble, MD;  Location: [facility] ENDOSCOPY;  Service: Pulmonary;;   BRONCHIAL BRUSHINGS  11/04/2020  Procedure: BRONCHIAL BRUSHINGS;  Surgeon: Collene Gobble, MD;  Location: [facility] ENDOSCOPY;  Service: Pulmonary;;   BRONCHIAL NEEDLE ASPIRATION BIOPSY  11/04/2020   Procedure: BRONCHIAL NEEDLE ASPIRATION BIOPSIES;  Surgeon: Collene Gobble, MD;  Location[facility] ENDOSCOPY;  Service: Pulmonary;;   COLONOSCOPY N/A 09/09/2012   Procedure: COLONOSCOPY;  Surgeon: Danie Binder, MD;  [facility];  Service: Endoscopy;  Laterality: N/A;  1:15-moved to 12:45 Melanie notified pt   FIDUCIAL MARKER PLACEMENT  11/04/2020   Procedure: FIDUCIAL MARKER PLACEMENT;  Surgeon: Collene Gobble, MD;  Location: [facility] ENDOSCOPY;  Service: Pulmonary;;   TUBAL LIGATION     VIDEO BRONCHOSCOPY WITH ENDOBRONCHIAL NAVIGATION N/A 11/04/2020   Procedure: ROBOTIC  VIDEO BRONCHOSCOPY WITH ENDOBRONCHIAL NAVIGATION;  Surgeon: Collene Gobble, MD;  Location: [place] ENDOSCOPY;  Service: Pulmonary;  Laterality: N/A;     Social History:   reports that she quit smoking about 10 years ago. Her smoking use included cigarettes. She has a 75.00 pack-year smoking history. She has never used smokeless tobacco. She reports that she does not drink alcohol and does not use drugs.   Family History:  Her family history includes Arthritis in her sister; COPD in her mother; Heart disease in her father. There is no history of Colon cancer.   Allergies No Known Allergies   Home Medications  Prior to Admission medications   Medication Sig Start Date  End Date Taking? Authorizing Provider  albuterol (PROVENTIL) (2.5 MG/3ML) 0.083% nebulizer solution Take 2.5 mg by nebulization See admin instructions. 2.5 mg by nebulization five times daily 01/23/22  Yes [provider]  albuterol (VENTOLIN HFA) 108 (90 Base) MCG/ACT inhaler Inhale 2 puffs into the lungs every 4 (four) hours as needed for wheezing or shortness of breath. 11/27/21  Yes Johnson, Clanford L, MD  ALPRAZolam (XANAX) 1 MG tablet Take 1 tablet (1 mg total) by mouth 3 (three) times daily as needed for anxiety. 11/27/21  Yes Johnson, Clanford L, MD  atorvastatin (LIPITOR) 20 MG tablet Take 20 mg by mouth daily. 05/06/20  Yes [provider]  ipratropium (ATROVENT) 0.02 % nebulizer solution Take 0.5 mg by nebulization See admin instructions. Use one vial by nebulization five times daily 01/23/22  Yes [provider]  ipratropium-albuterol (DUONEB) 0.5-2.5 (3) MG/3ML SOLN Take 3 mLs by nebulization 3 (three) times daily. And as needed for SOB 11/27/21  Yes Johnson, Clanford L, MD  Multiple Vitamins-Minerals (CENTRUM SILVER PO) Take 1 tablet by mouth daily.   Yes [provider]  omeprazole (PRILOSEC) 20 MG capsule Take 20 mg by mouth daily.   Yes [provider]  OXYGEN Place 3 L into the nose continuous.   Yes [provider]  traZODone (DESYREL) 50 MG tablet Take 50 mg by mouth at bedtime. 06/25/17  Yes [provider]  doxycycline (VIBRA-TABS) 100 MG tablet Take 100 mg by mouth daily. Patient not taking: Reported on 02/05/2022 11/27/21   [provider]  fluconazole (DIFLUCAN) 100 MG tablet Take 1 tablet (100 mg total) by mouth daily. Patient not taking: Reported on 02/05/2022 11/28/21   Murlean Iba, MD  ibuprofen (ADVIL) 200 MG tablet Take 200 mg by mouth every 6 (six) hours as needed for mild pain.    [provider]      The patient is critically ill with multiple organ systems failure and requires high  complexity decision making for assessment and support, frequent evaluation and titration of therapies, application of advanced monitoring technologies  and extensive interpretation of multiple databases. Critical Care Time devoted to patient care services described in this note is 45  minutes.   Christinia Gully, MD Pulmonary and [place] [phone]   After 7:00 pm call Elink  [phone]

## 2022-02-06 NOTE — Progress Notes (Signed)
PROGRESS NOTE    Patient: Donna Watkins                            PCP: Lemmie Evens, MD                    DOB: 1960/10/08            DOA: 02/05/2022 [id number]             DOS: 02/06/2022, 9:53 AM   LOS: 1 day   Date of Service: The patient was seen and examined on 02/06/2022  Subjective:   The patient was seen and examined this morning. On BiPAP... elevated PCO2, Awake   Brief Narrative:    Donna Watkins is a 62 y.o. female with medical history significant of anxiety, lung cancer, COPD, depression, GERD, hyperlipidemia, chronic respiratory failure with hypoxia requiring 3 L nasal cannula, and more presents ED with a chief complaint of dyspnea.  Most of her history is taken from [facility].  Patient is slow to answer questions and confused which is likely due to hypercapnia.  The sister reports that for 4 days patient has had dyspnea but it got acutely worse today.  She has been fatigued and sleeping all the time.  Her appetite has been intact.  She has been coughing and it sounds wet but she has not seen anything come up.  Patient has been using her breathing treatments and her rescue inhaler, but sister thinks that she is probably not using them as often as she should be.  Patient has been confused for the whole week per the sister.  No further history could be obtained at this time due to patient's confusion.   Patient does not smoke, does not drink.  She is full code.    Assessment & Plan:   Principal Problem:   Acute on chronic respiratory failure with hypoxia (HCC) Active Problems:   Acute exacerbation of chronic obstructive pulmonary disease (COPD) (HCC)   End stage COPD (HCC)   Mixed hyperlipidemia   Anxiety   Respiratory acidosis     Assessment and Plan: * Acute on chronic respiratory failure with hypoxia (HCC) - Base line: 3 L nasal cannula at home -  - She was initially satting in 70s, but improved to 90s on 6 L - On BiPAP due to hypercapnia:  PCO 104 - Secondary to COPD exacerbation - COVID, flu, RSV negative - Chest x-ray shows no acute process - Continue to monitor  Respiratory acidosis - pH 7.29, pCO2 greater than 120 - Secondary to COPD exacerbation - See treatment plan for COPD exacerbation  Anxiety - Continue reduced dosage of Xanax given COPD exacerbation  Mixed hyperlipidemia - Continue statin   End stage COPD ([place]) - treatment as above  - Dr. Melvyn Novas following  Discussion regarding palliative and CODE status   Acute exacerbation of chronic obstructive pulmonary disease (COPD) ([place]) - On BiPAP ... SOB ABG    Component Value Date/Time   PHART 7.45 02/06/2022 0632   PCO2ART 104 (HH) 02/06/2022 0632   PO2ART 72 (L) 02/06/2022 0632   HCO3 72.3 (H) 02/06/2022 0632   TCO2 28.9 04/27/2012 1422   O2SAT 97.5 02/06/2022 0632     - Pulm. Dr. Melvyn Novas consulted  - Continue as needed nebs and scheduled Atrovent -In ED: ABG: pH 7.29, pCO2 123, bicarb greater than 45 - on BiPAP - Trend VBG in the a.m. -  Sputum culture - Chest x-ray shows no acute process - COVID, flu, RSV negative -     ------------------------------------------------------------------------------------------------------------------------------------------------  DVT prophylaxis:  enoxaparin (LOVENOX) injection 30 mg Start: 02/05/22 2200 SCDs Start: 02/05/22 2051   Code Status:   Code Status: Full Code  Family Communication: No family member present at bedside- attempt will be made to update  The above findings and plan of care has been discussed with patient (and family)  in detail,  they expressed understanding and agreement of above. -Advance care planning has been discussed.   Admission status:   Status is: Inpatient Remains inpatient appropriate because: acute on Chronic respiratory failure ... Needing BIPAP   Disposition: From  - home             Planning for discharge in 2 days: to Home   Procedures:   No admission  procedures for hospital encounter.   Antimicrobials:  Anti-infectives (From admission, onward)    None        Medication:   atorvastatin  20 mg Oral Daily   Chlorhexidine Gluconate Cloth  6 each Topical Daily   enoxaparin (LOVENOX) injection  30 mg Subcutaneous Q24H   ipratropium  0.5 mg Nebulization Q6H   methylPREDNISolone (SOLU-MEDROL) injection  125 mg Intravenous Q12H   Followed by   Derrill Memo ON 02/07/2022] predniSONE  40 mg Oral Q breakfast   pantoprazole  40 mg Oral Daily   traZODone  50 mg Oral QHS    acetaminophen **OR** acetaminophen, albuterol, ALPRAZolam, fentaNYL (SUBLIMAZE) injection, haloperidol lactate, ondansetron **OR** ondansetron (ZOFRAN) IV, oxyCODONE   Objective:   Vitals:   02/06/22 0804 02/06/22 0857 02/06/22 0900 02/06/22 0935  BP: (!) 158/84  124/87   Pulse: 87 94 93 94  Resp: (!) 32 (!) 31 (!) 32 (!) 30  Temp:      TempSrc:      SpO2: (!) 87%  (!) 89% 94%  Weight:      Height:       No intake or output data in the 24 hours ending 02/06/22 0953 Filed Weights   02/05/22 1619 02/05/22 2107  Weight: 43.1 kg 42.7 kg     Physical examination:   Constitution:  Alert, cooperative, SOB ... On BIPAP Psychiatric:   Normal and stable mood and affect, cognition intact,   HEENT:        Normocephalic, PERRL, otherwise with in Normal limits  Chest:         Chest symmetric Cardio vascular:  S1/S2, RRR, No murmure, No Rubs or Gallops  pulmonary: on BiPAP -- poor air exchange respirations Labored,  negative wheezes / crackles Abdomen: Soft, non-tender, non-distended, bowel sounds,no masses, no organomegaly Muscular skeletal: Limited exam - in bed, able to move all 4 extremities,   Neuro: CNII-XII intact. , normal motor and sensation, reflexes intact  Extremities: No pitting edema lower extremities, +2 pulses  Skin: Dry, warm to touch, negative for any Rashes, No open wounds Wounds: per nursing  documentation   ------------------------------------------------------------------------------------------------------------------------------------------    LABs:     Latest Ref Rng & Units 02/06/2022    4:35 AM 02/05/2022    4:26 PM 11/26/2021    7:09 AM  CBC  WBC 4.0 - 10.5 K/uL 5.7  6.5  6.7   Hemoglobin 12.0 - 15.0 g/dL 13.0  13.6  12.8   Hematocrit 36.0 - 46.0 % 45.5  48.4  44.0   Platelets 150 - 400 K/uL 105  105  92  Latest Ref Rng & Units 02/06/2022    4:35 AM 02/05/2022    4:26 PM 11/26/2021    7:09 AM  CMP  Glucose 70 - 99 mg/dL 114  128  101   BUN 6 - 20 mg/dL 23  26  38   Creatinine 0.44 - 1.00 mg/dL 0.37  0.40  0.47   Sodium 135 - 145 mmol/L 143  147  143   Potassium 3.5 - 5.1 mmol/L 4.3  4.3  4.6   Chloride 98 - 111 mmol/L 80  80  80   CO2 22 - 32 mmol/L >45  >45  >45   Calcium 8.9 - 10.3 mg/dL 9.2  9.4  9.1   Total Protein 6.5 - 8.1 g/dL 6.4  6.8  6.8   Total Bilirubin 0.3 - 1.2 mg/dL 0.9  0.7  1.0   Alkaline Phos 38 - 126 U/L 57  60  54   AST 15 - 41 U/L 26  30  22    ALT 0 - 44 U/L 33  38  19        Micro Results Recent Results (from the past 240 hour(s))  Resp panel by RT-PCR (RSV, Flu A&B, Covid) Anterior Nasal Swab     Status: None   Collection Time: 02/05/22  4:25 PM   Specimen: Anterior Nasal Swab  Result Value Ref Range Status   SARS Coronavirus 2 by RT PCR NEGATIVE NEGATIVE Final    Comment: (NOTE) SARS-CoV-2 target nucleic acids are NOT DETECTED.  The SARS-CoV-2 RNA is generally detectable in upper respiratory specimens during the acute phase of infection. The lowest concentration of SARS-CoV-2 viral copies this assay can detect is 138 copies/mL. A negative result does not preclude SARS-Cov-2 infection and should not be used as the sole basis for treatment or other patient management decisions. A negative result may occur with  improper specimen collection/handling, submission of specimen other than nasopharyngeal swab, presence of  viral mutation(s) within the areas targeted by this assay, and inadequate number of viral copies(<138 copies/mL). A negative result must be combined with clinical observations, patient history, and epidemiological information. The expected result is Negative.  Fact Sheet for Patients:  EntrepreneurPulse.com.au  Fact Sheet for Healthcare Providers:  IncredibleEmployment.be  This test is no t yet approved or cleared by the [place] FDA and  has been authorized for detection and/or diagnosis of SARS-CoV-2 by FDA under an Emergency Use Authorization (EUA). This EUA will remain  in effect (meaning this test can be used) for the duration of the COVID-19 declaration under Section 564(b)(1) of the Act, 21 U.S.C.section 360bbb-3(b)(1), unless the authorization is terminated  or revoked sooner.       Influenza A by PCR NEGATIVE NEGATIVE Final   Influenza B by PCR NEGATIVE NEGATIVE Final    Comment: (NOTE) The Xpert Xpress SARS-CoV-2/FLU/RSV plus assay is intended as an aid in the diagnosis of influenza from Nasopharyngeal swab specimens and should not be used as a sole basis for treatment. Nasal washings and aspirates are unacceptable for Xpert Xpress SARS-CoV-2/FLU/RSV testing.  Fact Sheet for Patients: EntrepreneurPulse.com.au  Fact Sheet for Healthcare Providers: IncredibleEmployment.be  This test is not yet approved or cleared by the [place] FDA and has been authorized for detection and/or diagnosis of SARS-CoV-2 by FDA under an Emergency Use Authorization (EUA). This EUA will remain in effect (meaning this test can be used) for the duration of the COVID-19 declaration under Section 564(b)(1) of the Act, 21 U.S.C. section 360bbb-3(b)(1),  unless the authorization is terminated or revoked.     Resp Syncytial Virus by PCR NEGATIVE NEGATIVE Final    Comment: (NOTE) Fact Sheet for  Patients: EntrepreneurPulse.com.au  Fact Sheet for Healthcare Providers: IncredibleEmployment.be  This test is not yet approved or cleared by the [place] FDA and has been authorized for detection and/or diagnosis of SARS-CoV-2 by FDA under an Emergency Use Authorization (EUA). This EUA will remain in effect (meaning this test can be used) for the duration of the COVID-19 declaration under Section 564(b)(1) of the Act, 21 U.S.C. section 360bbb-3(b)(1), unless the authorization is terminated or revoked.  Performed at [facility], [address], [place], [place] [address]     Radiology Reports DG Chest Portable 1 View  Result Date: 02/05/2022 CLINICAL DATA:  Shortness of breath EXAM: PORTABLE CHEST 1 VIEW COMPARISON:  Chest x-ray 11/25/2021.  Chest CT 01/12/2022. FINDINGS: Cardiomediastinal silhouette is within normal limits. The lungs are hyperinflated compatible with COPD, unchanged. There is a nodular density in the right lower lung measuring 1 cm similar to prior CT. There is linear atelectasis or scarring in the left lung base. There is no pleural effusion or pneumothorax. No new focal lung infiltrate. No acute fractures. IMPRESSION: 1. No acute cardiopulmonary process. 2. COPD. 3. Nodular density in the right lower lung, similar to prior CT. Electronically Signed   By: Ronney Asters M.D.   On: 02/05/2022 18:09    SIGNED: Deatra James, MD, FHM. FAAFP. Zacarias Pontes - [facility] Time spent > 55 min.  In seeing, evaluating and examining the patient. Reviewing medical records, labs, drawn plan of care. [facility],  Pager (please use [email or web address] to page/ text) Please use Epic Secure Chat for non-urgent communication (7AM-7PM)  If 7PM-7AM, please contact night-coverage [email or web address], 02/06/2022, 9:53 AM

## 2022-02-06 NOTE — Progress Notes (Signed)
Pt found trying to get out of the bed and ripping her clothes and telemetry leads off. Pt helped back in bed and re-positioned. MD notified and PRN medications ordered. Pt is still disoriented and grabbing at things that aren't there and trying to rip off her BiPAP. Mitts placed for patient safety. Nursing staff having to sit in patient's room almost constantly to keep patient from pulling at things and to reorient her. MD came to bedside to reassess and stated to given PRN's and continue BiPAP therapy. Will continue to monitor.

## 2022-02-06 NOTE — Progress Notes (Signed)
Patient remains on BIPAP. RT adjusted settings to 16/8 per MD orders and decreased FIO2 to 35%. RT performed ABG and sent to LAB, results pending. RT will continue to monitor and assess.

## 2022-02-06 NOTE — Hospital Course (Signed)
Donna Watkins is a 62 y.o. female with medical history significant of anxiety, lung cancer, COPD, depression, GERD, hyperlipidemia, chronic respiratory failure with hypoxia requiring 3 L nasal cannula, and more presents ED with a chief complaint of dyspnea.  Most of her history is taken from [facility].  Patient is slow to answer questions and confused which is likely due to hypercapnia.  The sister reports that for 4 days patient has had dyspnea but it got acutely worse today.  She has been fatigued and sleeping all the time.  Her appetite has been intact.  She has been coughing and it sounds wet but she has not seen anything come up.  Patient has been using her breathing treatments and her rescue inhaler, but sister thinks that she is probably not using them as often as she should be.  Patient has been confused for the whole week per the sister.  No further history could be obtained at this time due to patient's confusion.   Patient does not smoke, does not drink.  She is full code.

## 2022-02-06 NOTE — Assessment & Plan Note (Signed)
-   treatment as above  - Dr. Melvyn Novas following  Discussion regarding palliative and CODE status

## 2022-02-06 NOTE — TOC Progression Note (Signed)
Transition of Care [facility]) - Progression Note    Patient Details  Name: Donna Watkins MRN: [id number] Date of Birth: June 17, 1960  [facility]) CM/SW Contact  Ihor Gully, LCSW Phone Number: 02/06/2022, 2:58 PM  Clinical Narrative:       Expected Discharge Plan: [place] Barriers to Discharge: Continued Medical Work up  Expected Discharge Plan and Services                                               Social Determinants of Health (SDOH) Interventions SDOH Screenings   Food Insecurity: No Food Insecurity (02/05/2022)  Housing: Low Risk  (02/05/2022)  Transportation Needs: No Transportation Needs (02/05/2022)  Utilities: Not At Risk (02/05/2022)  Alcohol Screen: Low Risk  (07/30/2021)  Depression (PHQ2-9): Low Risk  (08/12/2020)  Financial Resource Strain: Low Risk  (07/30/2021)  Physical Activity: Sufficiently Active (07/30/2021)  Social Connections: Socially Isolated (07/30/2021)  Stress: No Stress Concern Present (07/30/2021)  Tobacco Use: Medium Risk (11/26/2021)    Readmission Risk Interventions     No data to display

## 2022-02-06 NOTE — TOC Initial Note (Signed)
Transition of Care [facility]) - Initial/Assessment Note    Patient Details  Name: Donna Watkins MRN: [id number] Date of Birth: 01/22/1960  Transition [facility]) CM/SW Contact:    Ihor Gully, LCSW Phone Number: 02/06/2022, 2:57 PM  Clinical Narrative:                 Patient admitted for Acute on chronic respiratory failure with hypoxia. From home with sister, Lannette Donath. Independent at baseline. On 3L home oxygen. Has RW but has not been using it. TOC consulted for HH/DME needs. Patient is active with [facility] PT.  Expected Discharge Plan: [place] Barriers to Discharge: Continued Medical Work up   Patient Goals and CMS Choice Patient states their goals for this hospitalization and ongoing recovery are:: sister wants patient to return home          Expected Discharge Plan and Services                                              Prior Living Arrangements/Services     Patient language and need for interpreter reviewed:: Yes        Need for Family Participation in Patient Care: Yes (Comment) Care giver support system in place?: Yes (comment) Current home services: DME, Home PT (rw, home oxygen) Criminal Activity/Legal Involvement Pertinent to Current Situation/Hospitalization: No - Comment as needed  Activities of Daily Living Home Assistive Devices/Equipment: Eyeglasses, Dentures (specify type), Oxygen ADL Screening (condition at time of admission) Patient's cognitive ability adequate to safely complete daily activities?: No Is the patient deaf or have difficulty hearing?: No Does the patient have difficulty seeing, even when wearing glasses/contacts?: No Does the patient have difficulty concentrating, remembering, or making decisions?: Yes Patient able to express need for assistance with ADLs?: Yes Does the patient have difficulty dressing or bathing?: No Independently performs ADLs?: Yes (appropriate for developmental age) Does  the patient have difficulty walking or climbing stairs?: No Weakness of Legs: None Weakness of Arms/Hands: None  Permission Sought/Granted Permission sought to share information with : Family Supports    Share Information with NAME: sister, Lannette Donath           Emotional Assessment         Alcohol / Substance Use: Not Applicable Psych Involvement: No (comment)  Admission diagnosis:  COPD exacerbation ([place]) [J44.1] Acute on chronic respiratory failure with hypoxia ([place]) [J96.21] Patient Active Problem List   Diagnosis Date Noted   Acute on chronic respiratory failure with hypoxia ([place]) 02/05/2022   Respiratory acidosis 02/05/2022   Acute on chronic respiratory failure with hypoxia and hypercapnia ([place]) 11/26/2021   Fall at home, initial encounter 11/26/2021   Near syncope 11/26/2021   Oropharyngeal candidiasis 11/26/2021   Elevated MCV 11/26/2021   Thrombocytopenia ([place]) 11/26/2021   Hyperglycemia 11/26/2021   Nodule of lower lobe of right lung 11/26/2021   Squamous cell carcinoma of lung, stage I, unspecified laterality ([place]) 11/26/2021   GERD (gastroesophageal reflux disease) 11/26/2021   Mixed hyperlipidemia 11/26/2021   Underweight 11/26/2021   Failure to thrive in adult 11/26/2021   Anxiety 11/26/2021   Insomnia 11/26/2021   Protein-calorie malnutrition, severe 11/26/2021   Dysphagia 02/20/2021   Squamous carcinoma of lung, left ([place]) 11/12/2020   Nodule of upper lobe of left lung 08/12/2020   Rash 08/28/2019   Encounter for screening colonoscopy  08/31/2012   End stage COPD ([place]) 06/30/2011   Pulmonary nodule seen on imaging study 06/15/2011   Polycythemia secondary to smoking 06/15/2011   Iron deficiency anemia 06/15/2011   Dehydration, severe 06/14/2011   Hypotension 06/14/2011   Sinus bradycardia 06/14/2011   Arnold-Chiari malformation- mild 06/13/2011   Acute exacerbation of chronic obstructive pulmonary disease (COPD) ([place]) 06/13/2011   Depression  06/13/2011   Weight loss, non-intentional 06/13/2011   Hypokalemia 06/13/2011   Acute respiratory failure with hypoxia (HCC) 06/13/2011   Macrocytosis 06/13/2011   Chronic respiratory failure with hypoxia and hypercapnia ([place]) 06/13/2011   PCP:  Lemmie Evens, MD Pharmacy:   [facility], [place] [address] [place] [address] Phone: [phone] Fax: [phone]     Social Determinants of Health (SDOH) Social History: SDOH Screenings   Food Insecurity: No Food Insecurity (02/05/2022)  Housing: Low Risk  (02/05/2022)  Transportation Needs: No Transportation Needs (02/05/2022)  Utilities: Not At Risk (02/05/2022)  Alcohol Screen: Low Risk  (07/30/2021)  Depression (PHQ2-9): Low Risk  (08/12/2020)  Financial Resource Strain: Low Risk  (07/30/2021)  Physical Activity: Sufficiently Active (07/30/2021)  Social Connections: Socially Isolated (07/30/2021)  Stress: No Stress Concern Present (07/30/2021)  Tobacco Use: Medium Risk (11/26/2021)   SDOH Interventions:     Readmission Risk Interventions     No data to display

## 2022-02-06 NOTE — Progress Notes (Signed)
Pt pulled out both IV's and was bleeding everywhere. Pt still continuing to pull off BiPAP mask. Preventative foam placed on patient's nose to prevent breakdown. Pt still disoriented and trying to get out of bed constantly. Both MD's made aware. Another PRN medication ordered.   Sister is now at bedside, speaking with doctors regarding plan of care.

## 2022-02-06 NOTE — Progress Notes (Signed)
Date and time results received: 02/06/22 0938 (use smartphrase ".now" to insert current time)  Test: PCo2 Critical Value: 100  Name of Provider Notified: Dr. Melvyn Novas and Dr. Roger Shelter  Orders Received? Or Actions Taken?: Orders Received - See Orders for details

## 2022-02-07 DIAGNOSIS — J9621 Acute and chronic respiratory failure with hypoxia: Secondary | ICD-10-CM | POA: Diagnosis not present

## 2022-02-07 LAB — BASIC METABOLIC PANEL
BUN: 24 mg/dL — ABNORMAL HIGH (ref 6–20)
CO2: >45 mmol/L — ABNORMAL HIGH (ref 22–32)
Calcium: 9.4 mg/dL (ref 8.9–10.3)
Chloride: 84 mmol/L — ABNORMAL LOW (ref 98–111)
Creatinine, Ser: 0.5 mg/dL (ref 0.44–1.00)
GFR, Estimated: >60 mL/min (ref >60)
Glucose, Bld: 87 mg/dL (ref 70–99)
Potassium: 4.8 mmol/L (ref 3.5–5.1)
Sodium: 144 mmol/L (ref 135–145)

## 2022-02-07 LAB — BLOOD GAS, VENOUS
Acid-Base Excess: 34.2 mmol/L — ABNORMAL HIGH (ref 0.0–2.0)
Bicarbonate: 65.4 mmol/L — ABNORMAL HIGH (ref 20.0–28.0)
Drawn by: 7048
O2 Saturation: 93.5 %
Patient temperature: 36.8
pCO2, Ven: 91 mmHg (ref 44–60)
pH, Ven: 7.46 — ABNORMAL HIGH (ref 7.25–7.43)
pO2, Ven: 64 mmHg — ABNORMAL HIGH (ref 32–45)

## 2022-02-07 LAB — GLUCOSE, CAPILLARY
Glucose-Capillary: 75 mg/dL (ref 70–99)
Glucose-Capillary: 85 mg/dL (ref 70–99)

## 2022-02-07 MED ORDER — ENOXAPARIN SODIUM 40 MG/0.4ML IJ SOSY
40.0000 mg | PREFILLED_SYRINGE | INTRAMUSCULAR | Status: DC
  Administered 2022-02-07 – 2022-02-09 (×3): 40 mg via SUBCUTANEOUS
  Filled 2022-02-07 (×3): qty 0.4

## 2022-02-07 MED ORDER — AZITHROMYCIN 250 MG PO TABS
500.0000 mg | ORAL_TABLET | Freq: Every day | ORAL | Status: DC
  Administered 2022-02-07 – 2022-02-10 (×4): 500 mg via ORAL
  Filled 2022-02-07 (×4): qty 2

## 2022-02-07 MED ORDER — ALPRAZOLAM 0.5 MG PO TABS
1.0000 mg | ORAL_TABLET | Freq: Three times a day (TID) | ORAL | Status: DC
  Administered 2022-02-07 – 2022-02-08 (×6): 1 mg via ORAL
  Filled 2022-02-07 (×6): qty 2

## 2022-02-07 NOTE — Progress Notes (Signed)
Patient removed from [place] for the day and placed on 6lpm [place]. No issues noted a this time.

## 2022-02-07 NOTE — Progress Notes (Signed)
Placed patient on BIPAP for the night to help maintain lower CO2 levels and balanced pH. Lowered settings to 12/7 30% since patient is compensated now and for comfort so she can tolerate better. Will continue to monitor.

## 2022-02-07 NOTE — Evaluation (Signed)
Physical Therapy Evaluation Patient Details Name: Donna Watkins MRN: [id number] DOB: 11/12/1960 Today's Date: 02/07/2022  History of Present Illness  Per Dr. Roger Shelter, " Donna Watkins is a 62 y.o. female with medical history significant of anxiety, lung cancer, COPD, depression, GERD, hyperlipidemia, chronic respiratory failure with hypoxia requiring 3 L nasal cannula, and more presents ED with a chief complaint of dyspnea.   Most of her history is taken from [facility].  Patient is slow to answer questions and confused which is likely due to hypercapnia.  The sister reports that for 4 days patient has had dyspnea but it got acutely worse today.  She has been fatigued and sleeping all the time.  Her appetite has been intact.  She has been coughing and it sounds wet but she has not seen anything come up.  Patient has been using her breathing treatments and her rescue inhaler, but sister thinks that she is probably not using them as often as she should be.  Patient has been confused for the whole week per the sister  Clinical Impression  Pt alert and agreeable to therapy.  PT most likely will progress quickly and be able to return home without assistive device with out patient therapy to improve functional activity level.       Recommendations for follow up therapy are one component of a multi-disciplinary discharge planning process, led by the attending physician.  Recommendations may be updated based on patient status, additional functional criteria and insurance authorization.  Follow Up Recommendations Outpatient PT      Assistance Recommended at Discharge Intermittent Supervision/Assistance  Patient can return home with the following  A little help with walking and/or transfers    Equipment Recommendations Other (comment)  Recommendations for Other Services       Functional Status Assessment Patient has had a recent decline in their functional status and demonstrates the  ability to make significant improvements in function in a reasonable and predictable amount of time.     Precautions / Restrictions Precautions Precautions: Fall Precaution Comments: PT unsteady on feet without AD Restrictions Weight Bearing Restrictions: No      Mobility  Bed Mobility Overal bed mobility: Independent                  Transfers Overall transfer level: Independent                      Ambulation/Gait Ambulation/Gait assistance: Supervision Gait Distance (Feet): 3 Feet Assistive device: None Gait Pattern/deviations: Decreased step length - left, Decreased step length - right                Pertinent Vitals/Pain Pain Assessment Pain Assessment: No/denies pain               Extremity/Trunk Assessment        Lower Extremity Assessment Lower Extremity Assessment: Generalized weakness          Cognition Arousal/Alertness: Awake/alert Behavior During Therapy: WFL for tasks assessed/performed Overall Cognitive Status: Within Functional Limits for tasks assessed                                                 Assessment/Plan    PT Assessment Patient needs continued PT services  PT Problem List Decreased strength;Decreased activity tolerance;Decreased balance  PT Treatment Interventions Gait training;Therapeutic exercise    PT Goals (Current goals can be found in the Care Plan section)  Acute Rehab PT Goals Patient Stated Goal: to go home PT Goal Formulation: With patient Time For Goal Achievement: 02/10/22 Potential to Achieve Goals: Good    Frequency Min 3X/week        AM-PAC PT "6 Clicks" Mobility  Outcome Measure Help needed turning from your back to your side while in a flat bed without using bedrails?: None Help needed moving from lying on your back to sitting on the side of a flat bed without using bedrails?: None Help needed moving to and from a bed to a chair (including a  wheelchair)?: A Little Help needed standing up from a chair using your arms (e.g., wheelchair or bedside chair)?: A Little Help needed to walk in hospital room?: A Little Help needed climbing 3-5 steps with a railing? : A Little 6 Click Score: 20    End of Session Equipment Utilized During Treatment: Gait belt Activity Tolerance: Patient tolerated treatment well Patient left: in chair;with call bell/phone within reach Nurse Communication: Mobility status PT Visit Diagnosis: Unsteadiness on feet (R26.81)    Time: 1245-1310 PT Time Calculation (min) (ACUTE ONLY): 25 min   Charges:   PT Evaluation $PT Eval Low Complexity: 1 Low PT Treatments $Therapeutic Activity: 8-22 mins         Rayetta Humphrey, PT CLT [phone]  02/07/2022, 1:10 PM

## 2022-02-07 NOTE — Progress Notes (Signed)
PROGRESS NOTE    Patient: Donna Watkins                            PCP: Lemmie Evens, MD                    DOB: Jan 14, 1960            DOA: 02/05/2022 SWF:[id number]             DOS: 02/07/2022, 10:30 AM   LOS: 2 days   Date of Service: The patient was seen and examined on 02/07/2022  Subjective:   The patient was seen and examined this morning, traumatic improvement. Patient has been weaned off BiPAP, much more awake alert, following command, able to carry conversation Comfortable in bed requesting to eat  Patient admits to taking Xanax 1 mg p.o. 4 times a day for many years Also extensive history of COPD O2 dependent at home-approximately 3 L continuously   Brief Narrative:    EMMAMAE MCNAMARA is a 62 y.o. female with medical history significant of anxiety, lung cancer, COPD, depression, GERD, hyperlipidemia, chronic respiratory failure with hypoxia requiring 3 L nasal cannula, and more presents ED with a chief complaint of dyspnea.  Most of her history is taken from [facility].  Patient is slow to answer questions and confused which is likely due to hypercapnia.  The sister reports that for 4 days patient has had dyspnea but it got acutely worse today.  She has been fatigued and sleeping all the time.  Her appetite has been intact.  She has been coughing and it sounds wet but she has not seen anything come up.  Patient has been using her breathing treatments and her rescue inhaler, but sister thinks that she is probably not using them as often as she should be.  Patient has been confused for the whole week per the sister.  No further history could be obtained at this time due to patient's confusion.   Patient does not smoke, does not drink.  She is full code.    Assessment & Plan:   Principal Problem:   Acute on chronic respiratory failure with hypoxia (HCC) Active Problems:   Acute exacerbation of chronic obstructive pulmonary disease (COPD) (HCC)   End stage COPD  (HCC)   Mixed hyperlipidemia   Anxiety   Respiratory acidosis     Assessment and Plan: * Acute on chronic respiratory failure with hypoxia (HCC) - Base line: 3 L nasal cannula at home - Successfully weaned off BiPAP -Stable this morning, satting 98% on 6 L of oxygen  -On arrival in ED : she was initially satting in 70s, but improved to 90s on 6 L - On BiPAP due to hypercapnia: PCO 104 - Secondary to COPD exacerbation - COVID, flu, RSV negative - Chest x-ray shows no acute process - Continue to monitor  Respiratory acidosis -Continuing supplemental oxygen, weaning off - pH 7.29, pCO2 greater than 120 - Secondary to COPD exacerbation - See treatment plan for COPD exacerbation  Anxiety - Continue reduced dosage of Xanax given COPD exacerbation -Daughter and patient confirmed that she has been taking Xanax 1 mg 4 times a day for many years now The side effects of benzodiazepine dependence has been discussed with the patient in detail -Looks like she was going through withdrawal yesterday, responded to Ativan  Mixed hyperlipidemia - Continue statin   End stage COPD ([place]) - treatment  as above  - Dr. Melvyn Novas -pulmonologist was following closely -Discussed with the sister patient remains full code Full scope of care to be continued  Acute exacerbation of chronic obstructive pulmonary disease (COPD) ([place]) - Acute on chronic respiratory failure, with significant hypercapnia-altered mental status  -Dramatic improvement overnight noted -Has been weaned off BiPAP -Continue supplemental oxygen, with plan to wean down to her baseline of 3 L -Currently satting 98%  -Continue pulmonary toiletry, DuoNeb bronchodilator treatment scheduled, IV steroids  - Pulm. Dr. Melvyn Novas consulted  - Continue as needed nebs and scheduled Atrovent -In ED: ABG: pH 7.29, pCO2 123, bicarb greater than 45 - on BiPAP - Sputum culture - Chest x-ray shows no acute process - COVID, flu, RSV negative -  Initiating empiric p.o. antibiotics of azithromycin (due to cough, COPD exacerbation, sputum production)     ------------------------------------------------------------------------------------------------------------------------------------------------  DVT prophylaxis:  enoxaparin (LOVENOX) injection 30 mg Start: 02/05/22 2200 SCDs Start: 02/05/22 2051   Code Status:   Code Status: Full Code  Family Communication: No family member present at bedside- attempt will be made to update  The above findings and plan of care has been discussed with patient (and family)  in detail,  they expressed understanding and agreement of above. -Advance care planning has been discussed.   Admission status:   Status is: Inpatient Remains inpatient appropriate because: acute on Chronic respiratory failure ... Needing BIPAP   Disposition: From  - home             Planning for discharge in 2 days: to Home   Procedures:   No admission procedures for hospital encounter.   Antimicrobials:  Anti-infectives (From admission, onward)    None        Medication:   atorvastatin  20 mg Oral Daily   Chlorhexidine Gluconate Cloth  6 each Topical Daily   enoxaparin (LOVENOX) injection  30 mg Subcutaneous Q24H   ipratropium  0.5 mg Nebulization Q6H   levalbuterol  1.25 mg Nebulization Q6H   methylPREDNISolone sodium succinate  80 mg Intravenous Q12H   pantoprazole  40 mg Oral Daily   traZODone  50 mg Oral QHS    acetaminophen **OR** acetaminophen, albuterol, haloperidol lactate, LORazepam, morphine injection, ondansetron **OR** ondansetron (ZOFRAN) IV, oxyCODONE   Objective:   Vitals:   02/07/22 0500 02/07/22 0600 02/07/22 0700 02/07/22 0800  BP: 112/70 107/68 (!) 118/95 108/81  Pulse: 93 84 (!) 104 89  Resp: (!) 23 14 20 17   Temp:    97.9 F (36.6 C)  TempSrc:    Axillary  SpO2: 98% 98% 100% 98%  Weight:      Height:        Intake/Output Summary (Last 24 hours) at 02/07/2022  1030 Last data filed at 02/06/2022 2000 Gross per 24 hour  Intake 577.71 ml  Output 350 ml  Net 227.71 ml   Filed Weights   02/05/22 1619 02/05/22 2107 02/07/22 0357  Weight: 43.1 kg 42.7 kg 49.1 kg     Physical examination:   General:  Much more awake alert, following command  HEENT:  Normocephalic, PERRL, otherwise with in Normal limits   Neuro:  CNII-XII intact. , normal motor and sensation, reflexes intact   Lungs:   Diffuse rhonchi, mildly labored breathing, on supplemental oxygen via nasal cannula Diffuse wheezes / crackles  Cardio:    S1/S2, RRR, No murmure, No Rubs or Gallops   Abdomen:  Soft, non-tender, bowel sounds active all four quadrants, no guarding or peritoneal signs.  Muscular  skeletal:  Limited exam -global generalized weaknesses - in bed, able to move all 4 extremities,   2+ pulses,  symmetric, No pitting edema  Skin:  Dry, warm to touch, negative for any Rashes,  Wounds: Please see nursing documentation          ------------------------------------------------------------------------------------------------------------------------------------------    LABs:     Latest Ref Rng & Units 02/06/2022    4:35 AM 02/05/2022    4:26 PM 11/26/2021    7:09 AM  CBC  WBC 4.0 - 10.5 K/uL 5.7  6.5  6.7   Hemoglobin 12.0 - 15.0 g/dL 13.0  13.6  12.8   Hematocrit 36.0 - 46.0 % 45.5  48.4  44.0   Platelets 150 - 400 K/uL 105  105  92       Latest Ref Rng & Units 02/07/2022    3:51 AM 02/06/2022    4:35 AM 02/05/2022    4:26 PM  CMP  Glucose 70 - 99 mg/dL 87  114  128   BUN 6 - 20 mg/dL 24  23  26    Creatinine 0.44 - 1.00 mg/dL 0.50  0.37  0.40   Sodium 135 - 145 mmol/L 144  143  147   Potassium 3.5 - 5.1 mmol/L 4.8  4.3  4.3   Chloride 98 - 111 mmol/L 84  80  80   CO2 22 - 32 mmol/L >45  >45  >45   Calcium 8.9 - 10.3 mg/dL 9.4  9.2  9.4   Total Protein 6.5 - 8.1 g/dL  6.4  6.8   Total Bilirubin 0.3 - 1.2 mg/dL  0.9  0.7   Alkaline Phos 38 - 126 U/L  57  60    AST 15 - 41 U/L  26  30   ALT 0 - 44 U/L  33  38        Micro Results Recent Results (from the past 240 hour(s))  Resp panel by RT-PCR (RSV, Flu A&B, Covid) Anterior Nasal Swab     Status: None   Collection Time: 02/05/22  4:25 PM   Specimen: Anterior Nasal Swab  Result Value Ref Range Status   SARS Coronavirus 2 by RT PCR NEGATIVE NEGATIVE Final    Comment: (NOTE) SARS-CoV-2 target nucleic acids are NOT DETECTED.  The SARS-CoV-2 RNA is generally detectable in upper respiratory specimens during the acute phase of infection. The lowest concentration of SARS-CoV-2 viral copies this assay can detect is 138 copies/mL. A negative result does not preclude SARS-Cov-2 infection and should not be used as the sole basis for treatment or other patient management decisions. A negative result may occur with  improper specimen collection/handling, submission of specimen other than nasopharyngeal swab, presence of viral mutation(s) within the areas targeted by this assay, and inadequate number of viral copies(<138 copies/mL). A negative result must be combined with clinical observations, patient history, and epidemiological information. The expected result is Negative.  Fact Sheet for Patients:  EntrepreneurPulse.com.au  Fact Sheet for Healthcare Providers:  IncredibleEmployment.be  This test is no t yet approved or cleared by the [place] FDA and  has been authorized for detection and/or diagnosis of SARS-CoV-2 by FDA under an Emergency Use Authorization (EUA). This EUA will remain  in effect (meaning this test can be used) for the duration of the COVID-19 declaration under Section 564(b)(1) of the Act, 21 U.S.C.section 360bbb-3(b)(1), unless the authorization is terminated  or revoked sooner.       Influenza A  by PCR NEGATIVE NEGATIVE Final   Influenza B by PCR NEGATIVE NEGATIVE Final    Comment: (NOTE) The Xpert Xpress SARS-CoV-2/FLU/RSV  plus assay is intended as an aid in the diagnosis of influenza from Nasopharyngeal swab specimens and should not be used as a sole basis for treatment. Nasal washings and aspirates are unacceptable for Xpert Xpress SARS-CoV-2/FLU/RSV testing.  Fact Sheet for Patients: EntrepreneurPulse.com.au  Fact Sheet for Healthcare Providers: IncredibleEmployment.be  This test is not yet approved or cleared by the [place] FDA and has been authorized for detection and/or diagnosis of SARS-CoV-2 by FDA under an Emergency Use Authorization (EUA). This EUA will remain in effect (meaning this test can be used) for the duration of the COVID-19 declaration under Section 564(b)(1) of the Act, 21 U.S.C. section 360bbb-3(b)(1), unless the authorization is terminated or revoked.     Resp Syncytial Virus by PCR NEGATIVE NEGATIVE Final    Comment: (NOTE) Fact Sheet for Patients: EntrepreneurPulse.com.au  Fact Sheet for Healthcare Providers: IncredibleEmployment.be  This test is not yet approved or cleared by the [place] FDA and has been authorized for detection and/or diagnosis of SARS-CoV-2 by FDA under an Emergency Use Authorization (EUA). This EUA will remain in effect (meaning this test can be used) for the duration of the COVID-19 declaration under Section 564(b)(1) of the Act, 21 U.S.C. section 360bbb-3(b)(1), unless the authorization is terminated or revoked.  Performed at [facility], [address] [place], [place] [address]   MRSA Next Gen by PCR, Nasal     Status: None   Collection Time: 02/05/22  8:52 PM   Specimen: Nasal Mucosa; Nasal Swab  Result Value Ref Range Status   MRSA by PCR Next Gen NOT DETECTED NOT DETECTED Final    Comment: (NOTE) The GeneXpert MRSA Assay (FDA approved for NASAL specimens only), is one component of a comprehensive MRSA colonization surveillance program. It is not intended  to diagnose MRSA infection nor to guide or monitor treatment for MRSA infections. Test performance is not FDA approved in patients less than 75 years old. Performed at [facility], [address] [place], [place] [address]     Radiology Reports No results found.  SIGNED: Deatra James, MD, FHM. FAAFP. Zacarias Pontes - [facility] Time spent > 55 min.  Critical care time was spent in seeing, evaluating and examining the patient. Reviewing medical records, labs, drawn plan of care. [facility],  Pager (please use [email or web address] to page/ text) Please use Epic Secure Chat for non-urgent communication (7AM-7PM)  If 7PM-7AM, please contact night-coverage [email or web address], 02/07/2022, 10:30 AM

## 2022-02-08 DIAGNOSIS — J9621 Acute and chronic respiratory failure with hypoxia: Secondary | ICD-10-CM | POA: Diagnosis not present

## 2022-02-08 LAB — BASIC METABOLIC PANEL
Anion gap: 9 (ref 5–15)
BUN: 18 mg/dL (ref 6–20)
CO2: 42 mmol/L — ABNORMAL HIGH (ref 22–32)
Calcium: 8.5 mg/dL — ABNORMAL LOW (ref 8.9–10.3)
Chloride: 91 mmol/L — ABNORMAL LOW (ref 98–111)
Creatinine, Ser: 0.32 mg/dL — ABNORMAL LOW (ref 0.44–1.00)
GFR, Estimated: >60 mL/min (ref >60)
Glucose, Bld: 121 mg/dL — ABNORMAL HIGH (ref 70–99)
Potassium: 4.5 mmol/L (ref 3.5–5.1)
Sodium: 142 mmol/L (ref 135–145)

## 2022-02-08 LAB — CBC WITH DIFFERENTIAL/PLATELET
Abs Immature Granulocytes: 0.01 10*3/uL (ref 0.00–0.07)
Basophils Absolute: 0 10*3/uL (ref 0.0–0.1)
Basophils Relative: 0 %
Eosinophils Absolute: 0 10*3/uL (ref 0.0–0.5)
Eosinophils Relative: 0 %
HCT: 34.9 % — ABNORMAL LOW (ref 36.0–46.0)
Hemoglobin: 10.5 g/dL — ABNORMAL LOW (ref 12.0–15.0)
Immature Granulocytes: 0 %
Lymphocytes Relative: 8 %
Lymphs Abs: 0.3 10*3/uL — ABNORMAL LOW (ref 0.7–4.0)
MCH: 31.6 pg (ref 26.0–34.0)
MCHC: 30.1 g/dL (ref 30.0–36.0)
MCV: 105.1 fL — ABNORMAL HIGH (ref 80.0–100.0)
Monocytes Absolute: 0.1 10*3/uL (ref 0.1–1.0)
Monocytes Relative: 3 %
Neutro Abs: 3.2 10*3/uL (ref 1.7–7.7)
Neutrophils Relative %: 89 %
Platelets: 80 10*3/uL — ABNORMAL LOW (ref 150–400)
RBC: 3.32 MIL/uL — ABNORMAL LOW (ref 3.87–5.11)
RDW: 11.9 % (ref 11.5–15.5)
WBC: 3.5 10*3/uL — ABNORMAL LOW (ref 4.0–10.5)
nRBC: 0 % (ref 0.0–0.2)

## 2022-02-08 LAB — BLOOD GAS, VENOUS
Acid-Base Excess: 30.5 mmol/L — ABNORMAL HIGH (ref 0.0–2.0)
Bicarbonate: 61.7 mmol/L — ABNORMAL HIGH (ref 20.0–28.0)
Drawn by: 27160
O2 Saturation: 63.7 %
Patient temperature: 36.8
pCO2, Ven: 92 mmHg (ref 44–60)
pH, Ven: 7.43 (ref 7.25–7.43)
pO2, Ven: 39 mmHg (ref 32–45)

## 2022-02-08 MED ORDER — LEVALBUTEROL HCL 1.25 MG/0.5ML IN NEBU
1.2500 mg | INHALATION_SOLUTION | Freq: Three times a day (TID) | RESPIRATORY_TRACT | Status: DC
  Administered 2022-02-09: 1.25 mg via RESPIRATORY_TRACT
  Filled 2022-02-08: qty 0.5

## 2022-02-08 MED ORDER — IPRATROPIUM BROMIDE 0.02 % IN SOLN
0.5000 mg | Freq: Three times a day (TID) | RESPIRATORY_TRACT | Status: DC
  Administered 2022-02-09: 0.5 mg via RESPIRATORY_TRACT
  Filled 2022-02-08: qty 2.5

## 2022-02-08 MED ORDER — SODIUM CHLORIDE 0.9 % IV SOLN: INTRAVENOUS | Status: DC

## 2022-02-08 MED ORDER — METHYLPREDNISOLONE SODIUM SUCC 125 MG IJ SOLR
60.0000 mg | Freq: Two times a day (BID) | INTRAMUSCULAR | Status: DC
  Administered 2022-02-08: 60 mg via INTRAVENOUS
  Filled 2022-02-08: qty 2

## 2022-02-08 MED ORDER — SODIUM CHLORIDE 0.9 % IV BOLUS
500.0000 mL | INTRAVENOUS | Status: AC
  Administered 2022-02-08: 500 mL via INTRAVENOUS

## 2022-02-08 MED ORDER — LORAZEPAM 2 MG/ML IJ SOLN: 1.0000 mg | Freq: Four times a day (QID) | INTRAMUSCULAR | Status: DC | PRN

## 2022-02-08 MED ORDER — SODIUM CHLORIDE 0.9 % IV BOLUS
250.0000 mL | Freq: Once | INTRAVENOUS | Status: AC
  Administered 2022-02-08: 250 mL via INTRAVENOUS

## 2022-02-08 NOTE — Assessment & Plan Note (Addendum)
-   was hypotensive overnight, BP as low as 80/47 currently 109/52 -Status post IV fluid resuscitation, and maintenance fluids -Monitoring BP closely-  stable today

## 2022-02-08 NOTE — Progress Notes (Signed)
PROGRESS NOTE    Patient: Donna Watkins                            PCP: Lemmie Evens, MD                    DOB: Dec 20, 1960            DOA: 02/05/2022 LFY:[id number]             DOS: 02/08/2022, 11:03 AM   LOS: 3 days   Date of Service: The patient was seen and examined on 02/08/2022  Subjective:   The patient was seen and examined this morning, awake alert oriented, sitting in bed comfortable...  Had a hypotensive episode, IV fluid was given BP has improved -VBG also revealing CO2 of 92   Brief Narrative:    Donna Watkins is a 62 y.o. female with medical history significant of anxiety, lung cancer, COPD, depression, GERD, hyperlipidemia, chronic respiratory failure with hypoxia requiring 3 L nasal cannula, and more presents ED with a chief complaint of dyspnea.  Most of her history is taken from [facility].  Patient is slow to answer questions and confused which is likely due to hypercapnia.  The sister reports that for 4 days patient has had dyspnea but it got acutely worse today.  She has been fatigued and sleeping all the time.  Her appetite has been intact.  She has been coughing and it sounds wet but she has not seen anything come up.  Patient has been using her breathing treatments and her rescue inhaler, but sister thinks that she is probably not using them as often as she should be.  Patient has been confused for the whole week per the sister.  No further history could be obtained at this time due to patient's confusion.   Patient does not smoke, does not drink.  She is full code.    Assessment & Plan:   Principal Problem:   Acute on chronic respiratory failure with hypoxia (HCC) Active Problems:   Hypotensive episode   Acute exacerbation of chronic obstructive pulmonary disease (COPD) (HCC)   End stage COPD (HCC)   Mixed hyperlipidemia   Anxiety   Respiratory acidosis     Assessment and Plan: * Acute on chronic respiratory failure with hypoxia  (HCC) - Base line: 3 L nasal cannula at home - Was successfully weaned off continuous BiPAP, tolerated BiPAP overnight -VBG at 92 -This morning stable on 3 L of oxygen, satting 96%   -On arrival in ED : she was initially satting in 70s, but improved to 90s on 6 L - On BiPAP due to hypercapnia: PCO 104 - Secondary to COPD exacerbation - COVID, flu, RSV negative - Chest x-ray shows no acute process - Continue to monitor  Hypotensive episode - was hypotensive overnight, BP as low as 80/47 currently 109/52 -Status post IV fluid resuscitation, and maintenance fluids -Monitoring BP closely   Respiratory acidosis -Continuing supplemental oxygen, weaning off BiPAP - POA: PCO2 greater than 120 >>92  - Secondary to COPD exacerbation - See treatment plan for COPD exacerbation  Anxiety - Continue of Xanax given COPD exacerbation -The patient and family takes Xanax 1 mg up to 4 times a day -Discussed the side effect of benzodiazepine dependence and withdrawal -Agreed to slowly taper down  -On this admission when in respiratory failure patient received IV Ativan  Mixed hyperlipidemia - Continue statin  End stage COPD ([facility]) - treatment as above  - Dr. Melvyn Novas -pulmonologist was following -Discussed with the sister patient remains full code Full scope of care to be continued  Acute exacerbation of chronic obstructive pulmonary disease (COPD) ([place]) - Acute on chronic respiratory failure, with significant hypercapnia-altered mental status  -Much improved on BiPAP overnight -VBG still reporting pCO2 of 92   -Off continuous BiPAP, currently on 3 L, satting 96% -It is clear the patient may benefit from trilogy, BiPAP at night.  -Continue pulmonary toiletry, DuoNeb bronchodilator treatment scheduled, IV steroids  - Pulm. Dr. Melvyn Novas was consulted  - Continue as needed nebs and scheduled Atrovent -In ED: ABG: pH 7.29, pCO2 123, bicarb greater than 45 - on BiPAP - Sputum culture -  NGTD - Chest x-ray shows no acute process - COVID, flu, RSV negative - Initiating empiric p.o. antibiotics of azithromycin (due to cough, COPD exacerbation, sputum production)     ------------------------------------------------------------------------------------------------------------------------------------------------  DVT prophylaxis:  enoxaparin (LOVENOX) injection 40 mg Start: 02/07/22 2200 SCDs Start: 02/05/22 2051   Code Status:   Code Status: Full Code  Family Communication: No family member present at bedside- attempt will be made to update  The above findings and plan of care has been discussed with patient (and family)  in detail,  they expressed understanding and agreement of above. -Advance care planning has been discussed.   Admission status:   Status is: Inpatient Remains inpatient appropriate because: acute on Chronic respiratory failure ... Needing BIPAP   Disposition: From  - home             Planning for discharge in 2 days: to Home   Procedures:   No admission procedures for hospital encounter.   Antimicrobials:  Anti-infectives (From admission, onward)    Start     Dose/Rate Route Frequency Ordered Stop   02/07/22 1130  azithromycin (ZITHROMAX) tablet 500 mg        500 mg Oral Daily 02/07/22 1031          Medication:   ALPRAZolam  1 mg Oral TID   atorvastatin  20 mg Oral Daily   azithromycin  500 mg Oral Daily   Chlorhexidine Gluconate Cloth  6 each Topical Daily   enoxaparin (LOVENOX) injection  40 mg Subcutaneous Q24H   ipratropium  0.5 mg Nebulization Q6H   levalbuterol  1.25 mg Nebulization Q6H   methylPREDNISolone sodium succinate  80 mg Intravenous Q12H   pantoprazole  40 mg Oral Daily    acetaminophen **OR** acetaminophen, albuterol, LORazepam, morphine injection, ondansetron **OR** ondansetron (ZOFRAN) IV, oxyCODONE   Objective:   Vitals:   02/08/22 0700 02/08/22 0800 02/08/22 0813 02/08/22 0900  BP: (!) 90/56 (!) 102/57  (!) 113/52 (!) 109/52  Pulse: (!) 55 (!) 52 (!) 59 73  Resp: 20 (!) 22 (!) 26 (!) 23  Temp:      TempSrc:      SpO2: 96% 97% 99% 96%  Weight:      Height:        Intake/Output Summary (Last 24 hours) at 02/08/2022 1103 Last data filed at 02/08/2022 0900 Gross per 24 hour  Intake 1576.05 ml  Output --  Net 1576.05 ml   Filed Weights   02/05/22 1619 02/05/22 2107 02/07/22 0357  Weight: 43.1 kg 42.7 kg 49.1 kg     Physical examination:   General:  AAO x 3,  cooperative, no distress;   HEENT:  Normocephalic, PERRL, otherwise with in Normal limits  Neuro:  CNII-XII intact. , normal motor and sensation, reflexes intact   Lungs:   Clear to auscultation BL, Respirations unlabored,  No wheezes / crackles  Cardio:    S1/S2, RRR, No murmure, No Rubs or Gallops   Abdomen:  Soft, non-tender, bowel sounds active all four quadrants, no guarding or peritoneal signs.  Muscular  skeletal:  Limited exam -global generalized weaknesses - in bed, able to move all 4 extremities,   2+ pulses,  symmetric, No pitting edema  Skin:  Dry, warm to touch, negative for any Rashes,  Wounds: Please see nursing documentation        ---------------------------------------------------------------------------------------------------------------------------    LABs:     Latest Ref Rng & Units 02/08/2022    7:12 AM 02/06/2022    4:35 AM 02/05/2022    4:26 PM  CBC  WBC 4.0 - 10.5 K/uL 3.5  5.7  6.5   Hemoglobin 12.0 - 15.0 g/dL 10.5  13.0  13.6   Hematocrit 36.0 - 46.0 % 34.9  45.5  48.4   Platelets 150 - 400 K/uL 80  105  105       Latest Ref Rng & Units 02/08/2022    4:57 AM 02/07/2022    3:51 AM 02/06/2022    4:35 AM  CMP  Glucose 70 - 99 mg/dL 121  87  114   BUN 6 - 20 mg/dL 18  24  23    Creatinine 0.44 - 1.00 mg/dL 0.32  0.50  0.37   Sodium 135 - 145 mmol/L 142  144  143   Potassium 3.5 - 5.1 mmol/L 4.5  4.8  4.3   Chloride 98 - 111 mmol/L 91  84  80   CO2 22 - 32 mmol/L 42  >45  >45   Calcium  8.9 - 10.3 mg/dL 8.5  9.4  9.2   Total Protein 6.5 - 8.1 g/dL   6.4   Total Bilirubin 0.3 - 1.2 mg/dL   0.9   Alkaline Phos 38 - 126 U/L   57   AST 15 - 41 U/L   26   ALT 0 - 44 U/L   33        Micro Results Recent Results (from the past 240 hour(s))  Resp panel by RT-PCR (RSV, Flu A&B, Covid) Anterior Nasal Swab     Status: None   Collection Time: 02/05/22  4:25 PM   Specimen: Anterior Nasal Swab  Result Value Ref Range Status   SARS Coronavirus 2 by RT PCR NEGATIVE NEGATIVE Final    Comment: (NOTE) SARS-CoV-2 target nucleic acids are NOT DETECTED.  The SARS-CoV-2 RNA is generally detectable in upper respiratory specimens during the acute phase of infection. The lowest concentration of SARS-CoV-2 viral copies this assay can detect is 138 copies/mL. A negative result does not preclude SARS-Cov-2 infection and should not be used as the sole basis for treatment or other patient management decisions. A negative result may occur with  improper specimen collection/handling, submission of specimen other than nasopharyngeal swab, presence of viral mutation(s) within the areas targeted by this assay, and inadequate number of viral copies(<138 copies/mL). A negative result must be combined with clinical observations, patient history, and epidemiological information. The expected result is Negative.  Fact Sheet for Patients:  EntrepreneurPulse.com.au  Fact Sheet for Healthcare Providers:  IncredibleEmployment.be  This test is no t yet approved or cleared by the [place] FDA and  has been authorized for detection and/or diagnosis of SARS-CoV-2 by FDA under  an Emergency Use Authorization (EUA). This EUA will remain  in effect (meaning this test can be used) for the duration of the COVID-19 declaration under Section 564(b)(1) of the Act, 21 U.S.C.section 360bbb-3(b)(1), unless the authorization is terminated  or revoked sooner.        Influenza A by PCR NEGATIVE NEGATIVE Final   Influenza B by PCR NEGATIVE NEGATIVE Final    Comment: (NOTE) The Xpert Xpress SARS-CoV-2/FLU/RSV plus assay is intended as an aid in the diagnosis of influenza from Nasopharyngeal swab specimens and should not be used as a sole basis for treatment. Nasal washings and aspirates are unacceptable for Xpert Xpress SARS-CoV-2/FLU/RSV testing.  Fact Sheet for Patients: EntrepreneurPulse.com.au  Fact Sheet for Healthcare Providers: IncredibleEmployment.be  This test is not yet approved or cleared by the [place] FDA and has been authorized for detection and/or diagnosis of SARS-CoV-2 by FDA under an Emergency Use Authorization (EUA). This EUA will remain in effect (meaning this test can be used) for the duration of the COVID-19 declaration under Section 564(b)(1) of the Act, 21 U.S.C. section 360bbb-3(b)(1), unless the authorization is terminated or revoked.     Resp Syncytial Virus by PCR NEGATIVE NEGATIVE Final    Comment: (NOTE) Fact Sheet for Patients: EntrepreneurPulse.com.au  Fact Sheet for Healthcare Providers: IncredibleEmployment.be  This test is not yet approved or cleared by the [place] FDA and has been authorized for detection and/or diagnosis of SARS-CoV-2 by FDA under an Emergency Use Authorization (EUA). This EUA will remain in effect (meaning this test can be used) for the duration of the COVID-19 declaration under Section 564(b)(1) of the Act, 21 U.S.C. section 360bbb-3(b)(1), unless the authorization is terminated or revoked.  Performed at [facility], [address] [place], [place] [address]   MRSA Next Gen by PCR, Nasal     Status: None   Collection Time: 02/05/22  8:52 PM   Specimen: Nasal Mucosa; Nasal Swab  Result Value Ref Range Status   MRSA by PCR Next Gen NOT DETECTED NOT DETECTED Final    Comment: (NOTE) The GeneXpert  MRSA Assay (FDA approved for NASAL specimens only), is one component of a comprehensive MRSA colonization surveillance program. It is not intended to diagnose MRSA infection nor to guide or monitor treatment for MRSA infections. Test performance is not FDA approved in patients less than 56 years old. Performed at [facility], [address] [place], [place] [address]     Radiology Reports No results found.  SIGNED: Deatra James, MD, FHM. FAAFP. [facility]ac[facility] Pontes - [facility] Time spent > 55 min.  Critical care time was spent in seeing, evaluating and examining the patient. Reviewing medical records, labs, drawn plan of care. [facility],  Pager (please use [email or web address] to page/ text) Please use Epic Secure Chat for non-urgent communication (7AM-7PM)  If 7PM-7AM, please contact night-coverage [email or web address], 02/08/2022, 11:03 AM

## 2022-02-08 NOTE — Progress Notes (Signed)
Notified of CO2 from venous gas was 92 doctor notified at bedside.

## 2022-02-08 NOTE — Progress Notes (Signed)
Overnight cross coverage Hypotensive requiring total 750 cc NS IV fluid boluses.  IV fluid maintenance NS at 100 cc/h x 4 hours.  Tolerated IV fluid boluses well.  Lungs are clear to auscultation.  No signs of volume overload.  Repeating VBG, pending.  May benefit from trilogy, NIV.  Time: 15 minutes

## 2022-02-08 NOTE — Progress Notes (Signed)
Patient did well with BIPAP overnight last night. Will transition to dreamstation BIPAP tonight on same settings as V60 tonight.

## 2022-02-08 NOTE — Progress Notes (Incomplete)
Walked patient about 75 feet in hallway with 3lpm oxygen flowing by nasal cannula. Oxygen saturation levels did drop to about 87% while on oxygen. Back in room placed patient in chair with 3

## 2022-02-08 NOTE — Progress Notes (Signed)
Patient tolerating BIPAP well still at this time.

## 2022-02-08 NOTE — Progress Notes (Signed)
PT wore bipap machine all night and tolerated well.  PT taken off this morning and given nebulizer tx and placed on 3L [place].  HR 58, RR 16 and sats 99%.

## 2022-02-09 ENCOUNTER — Inpatient Hospital Stay (HOSPITAL_COMMUNITY): Payer: Medicare HMO

## 2022-02-09 DIAGNOSIS — J9621 Acute and chronic respiratory failure with hypoxia: Secondary | ICD-10-CM | POA: Diagnosis not present

## 2022-02-09 LAB — BASIC METABOLIC PANEL
Anion gap: 10 (ref 5–15)
BUN: 14 mg/dL (ref 6–20)
CO2: 42 mmol/L — ABNORMAL HIGH (ref 22–32)
Calcium: 8.7 mg/dL — ABNORMAL LOW (ref 8.9–10.3)
Chloride: 89 mmol/L — ABNORMAL LOW (ref 98–111)
Creatinine, Ser: 0.32 mg/dL — ABNORMAL LOW (ref 0.44–1.00)
GFR, Estimated: >60 mL/min (ref >60)
Glucose, Bld: 125 mg/dL — ABNORMAL HIGH (ref 70–99)
Potassium: 3.7 mmol/L (ref 3.5–5.1)
Sodium: 141 mmol/L (ref 135–145)

## 2022-02-09 LAB — BLOOD GAS, ARTERIAL
Acid-Base Excess: 28.2 mmol/L — ABNORMAL HIGH (ref 0.0–2.0)
Bicarbonate: 58.9 mmol/L — ABNORMAL HIGH (ref 20.0–28.0)
Drawn by: 22766
O2 Content: 2.5 L/min
O2 Saturation: 94.8 %
Patient temperature: 36.9
pCO2 arterial: 93 mmHg (ref 32–48)
pH, Arterial: 7.41 (ref 7.35–7.45)
pO2, Arterial: 70 mmHg — ABNORMAL LOW (ref 83–108)

## 2022-02-09 LAB — URINALYSIS, W/ REFLEX TO CULTURE (INFECTION SUSPECTED)
Bacteria, UA: NONE SEEN
Bilirubin Urine: NEGATIVE
Glucose, UA: NEGATIVE mg/dL
Hgb urine dipstick: NEGATIVE
Ketones, ur: NEGATIVE mg/dL
Leukocytes,Ua: NEGATIVE
Nitrite: NEGATIVE
Protein, ur: NEGATIVE mg/dL
Specific Gravity, Urine: 1.01 (ref 1.005–1.030)
pH: 6 (ref 5.0–8.0)

## 2022-02-09 LAB — BRAIN NATRIURETIC PEPTIDE: B Natriuretic Peptide: 31 pg/mL (ref 0.0–100.0)

## 2022-02-09 MED ORDER — ARFORMOTEROL TARTRATE 15 MCG/2ML IN NEBU
15.0000 ug | INHALATION_SOLUTION | Freq: Two times a day (BID) | RESPIRATORY_TRACT | Status: DC
  Administered 2022-02-09 – 2022-02-10 (×2): 15 ug via RESPIRATORY_TRACT
  Filled 2022-02-09 (×2): qty 2

## 2022-02-09 MED ORDER — ALPRAZOLAM 0.5 MG PO TABS
0.5000 mg | ORAL_TABLET | Freq: Three times a day (TID) | ORAL | Status: DC
  Administered 2022-02-09 – 2022-02-10 (×4): 0.5 mg via ORAL
  Filled 2022-02-09 (×4): qty 1

## 2022-02-09 MED ORDER — SENNOSIDES-DOCUSATE SODIUM 8.6-50 MG PO TABS
1.0000 | ORAL_TABLET | Freq: Every day | ORAL | Status: DC
  Administered 2022-02-09: 1 via ORAL
  Filled 2022-02-09: qty 1

## 2022-02-09 MED ORDER — METHYLPREDNISOLONE SODIUM SUCC 125 MG IJ SOLR
40.0000 mg | Freq: Two times a day (BID) | INTRAMUSCULAR | Status: DC
  Administered 2022-02-09 (×2): 40 mg via INTRAVENOUS
  Filled 2022-02-09 (×2): qty 2

## 2022-02-09 MED ORDER — REVEFENACIN 175 MCG/3ML IN SOLN
175.0000 ug | Freq: Every day | RESPIRATORY_TRACT | Status: DC
  Administered 2022-02-09 – 2022-02-10 (×2): 175 ug via RESPIRATORY_TRACT
  Filled 2022-02-09 (×2): qty 3

## 2022-02-09 MED ORDER — POLYETHYLENE GLYCOL 3350 17 G PO PACK
17.0000 g | PACK | Freq: Every day | ORAL | Status: DC
  Administered 2022-02-09 – 2022-02-10 (×2): 17 g via ORAL
  Filled 2022-02-09 (×2): qty 1

## 2022-02-09 NOTE — Plan of Care (Signed)
  Problem: Education: Goal: Knowledge of disease or condition will improve Outcome: Not Progressing Goal: Knowledge of the prescribed therapeutic regimen will improve Outcome: Not Progressing Goal: Individualized Educational Video(s) Outcome: Not Progressing   Problem: Activity: Goal: Ability to tolerate increased activity will improve Outcome: Not Progressing Goal: Will verbalize the importance of balancing activity with adequate rest periods Outcome: Not Progressing   Problem: Respiratory: Goal: Ability to maintain a clear airway will improve Outcome: Not Progressing Goal: Levels of oxygenation will improve Outcome: Not Progressing Goal: Ability to maintain adequate ventilation will improve Outcome: Not Progressing   Problem: Education: Goal: Knowledge of General Education information will improve Description: Including pain rating scale, medication(s)/side effects and non-pharmacologic comfort measures Outcome: Not Progressing   Problem: Health Behavior/Discharge Planning: Goal: Ability to manage health-related needs will improve Outcome: Not Progressing   Problem: Clinical Measurements: Goal: Ability to maintain clinical measurements within normal limits will improve Outcome: Not Progressing Goal: Will remain free from infection Outcome: Not Progressing Goal: Diagnostic test results will improve Outcome: Not Progressing Goal: Respiratory complications will improve Outcome: Not Progressing Goal: Cardiovascular complication will be avoided Outcome: Not Progressing   Problem: Activity: Goal: Risk for activity intolerance will decrease Outcome: Not Progressing   Problem: Nutrition: Goal: Adequate nutrition will be maintained Outcome: Not Progressing   Problem: Coping: Goal: Level of anxiety will decrease Outcome: Not Progressing   Problem: Elimination: Goal: Will not experience complications related to bowel motility Outcome: Not Progressing Goal: Will not  experience complications related to urinary retention Outcome: Not Progressing   Problem: Pain Managment: Goal: General experience of comfort will improve Outcome: Not Progressing   Problem: Safety: Goal: Ability to remain free from injury will improve Outcome: Not Progressing   Problem: Skin Integrity: Goal: Risk for impaired skin integrity will decrease Outcome: Not Progressing

## 2022-02-09 NOTE — Progress Notes (Signed)
NAME:  Donna Watkins, MRN:  [id number], DOB:  1960/08/21, LOS: 4 ADMISSION DATE:  02/05/2022, CONSULTATION DATE:  2/2 REFERRING MD:  Shahmedi , CHIEF COMPLAINT:  sob    History of Present Illness:  33 yowf remote smoker with GOLD IV COPD/ 02 dep admit 2/1 with 4 days worsening resting sob/ ams/drowsiness with finding of severe hypercarbia s infiltrates on cxr and placed on bipap and PCCM consulted am 2/2     Pertinent  Medical History  Quit smoking 2013/MM phenotype - PFT's  04/27/12  FEV1 0.70 (26 %) ratio 35  p no % improvement from saba p ? prior to study with DLCO  53 % corrects to 69 % for alv volume with classic curvature   - Alpha one AT screening   07/31/16 >   MM - PFT's  12/31/2016  FEV1 0.74 (28 % ) ratio 38  p no % improvement from saba p saba 4h   prior to study with DLCO  37/38c % corrects to 50 % for alv volume   - 07/02/2017   try bevespi: did not use correctly  - 01/03/2018    try bevespi again > could not afford and could not tell really helped vs duoneb - 05/26/2019  After extensive coaching inhaler device,  effectiveness =    90% with smi > try stiolto> could not afford it on her plan >  changed 08/14/2020 to maint on duoneb qid and no clinic f/u sinc 08/2021     Significant Hospital Events: Including procedures, antibiotic start and stop dates in addition to other pertinent events   2/1 placed on bipap    Scheduled Meds:  ALPRAZolam  0.5 mg Oral TID   atorvastatin  20 mg Oral Daily   azithromycin  500 mg Oral Daily   Chlorhexidine Gluconate Cloth  6 each Topical Daily   enoxaparin (LOVENOX) injection  40 mg Subcutaneous Q24H   ipratropium  0.5 mg Nebulization TID   levalbuterol  1.25 mg Nebulization TID   methylPREDNISolone sodium succinate  40 mg Intravenous Q12H   pantoprazole  40 mg Oral Daily   Continuous Infusions:   PRN Meds:.acetaminophen **OR** acetaminophen, albuterol, LORazepam, morphine injection, ondansetron **OR** ondansetron (ZOFRAN) IV,  oxyCODONE    Interim History / Subjective:   Much improved since admission. Discussed with TR[facility] MD Afebrile, used BiPAP overnight, now on 3 L nasal cannula  Objective   Blood pressure 121/75, pulse 93, temperature 98.4 F (36.9 C), temperature source Oral, resp. rate 19, height 5\' 3"  (1.6 m), weight 49.1 kg, last menstrual period 05/06/2010, SpO2 100 %.        Intake/Output Summary (Last 24 hours) at 02/09/2022 1346 Last data filed at 02/09/2022 1313 Gross per 24 hour  Intake 360 ml  Output 1000 ml  Net -640 ml   Filed Weights   02/05/22 1619 02/05/22 2107 02/07/22 0357  Weight: 43.1 kg 42.7 kg 49.1 kg    Examination:  General appearance:    acute/ chronically ill eldelry wf >>>stated age     No jvd, no pallor or icterus Neck supple Lungs with extremely distant bs bilaterally, no accessory muscle use RRR no s3 or or sign murmur Abd soft/ limited  excursion  Extr warm with no edema or clubbing noted Neuro alert, interactive, nonfocal, no asterixis  Chest x-ray does not show any acute infiltrates or effusion CT angiogram chest 11/2021 shows enlarging 8 x 8 mm right lower lobe nodule  Assessment & Plan:  1)  Acute on chronic (HC03 > 45) hypercarbic and hypoxemic resp failure secondary to endstage COPD   -For some reason she was not on triple therapy and was only using DuoNebs at home. -Suggest budesonide/Brovana/Yupelri combination during hospital stay and on discharge can consider adding Trelegy/Breztri whichever is covered under insurance -Prednisone taper over 2 weeks starting at 40 mg -She will qualify for NIV and this should be initiated at discharge   2) AMS secondary to hypercabia/ meds c/w TME >>>resolved   The patient continues to exhibit signs of hypercapnea associated with chronic respiratory failure secondary to severe COPD .  Interruption or failure to provide NIV would quickly lead to exacerbation of the patient's condition, hospital readmission, and likely  harm the patient.  Continued use is preferred.  The use of the NIV will treat the patient's PCO2 levels and can reduce the risk of exacerbations and future hospitalizations when used at night and during the day.  Bilevel/RAD therapy with and without a rate would be ineffective as the patient requires a volume targeted mode.  Ventilation is required to decrease the work of breathing and improve pulmonary status.  Interruption of ventilator support would lead to a decline of health status.  Patient is able to protect their airways and clear secretions on their own.    Labs   CBC: Recent Labs  Lab 02/05/22 1626 02/06/22 0435 02/08/22 0712  WBC 6.5 5.7 3.5*  NEUTROABS 5.8 5.2 3.2  HGB 13.6 13.0 10.5*  HCT 48.4* 45.5 34.9*  MCV 113.1* 111.5* 105.1*  PLT 105* 105* 80*     Basic Metabolic Panel: Recent Labs  Lab 02/05/22 1626 02/06/22 0435 02/07/22 0351 02/08/22 0457 02/09/22 0430  NA 147* 143 144 142 141  K 4.3 4.3 4.8 4.5 3.7  CL 80* 80* 84* 91* 89*  CO2 >45* >45* >45* 42* 42*  GLUCOSE 128* 114* 87 121* 125*  BUN 26* 23* 24* 18 14  CREATININE 0.40* 0.37* 0.50 0.32* 0.32*  CALCIUM 9.4 9.2 9.4 8.5* 8.7*  MG  --  1.8  --   --   --     GFR: Estimated Creatinine Clearance: 58 mL/min (A) (by C-G formula based on SCr of 0.32 mg/dL (L)). Recent Labs  Lab 02/05/22 1626 02/06/22 0435 02/06/22 0724 02/08/22 0712  PROCALCITON  --  <0.10  --   --   WBC 6.5 5.7  --  3.5*  LATICACIDVEN  --   --  1.5  --      Liver Function Tests: Recent Labs  Lab 02/05/22 1626 02/06/22 0435  AST 30 26  ALT 38 33  ALKPHOS 60 57  BILITOT 0.7 0.9  PROT 6.8 6.4*  ALBUMIN 4.0 3.9    No results for input(s): "LIPASE", "AMYLASE" in the last 168 hours. No results for input(s): "AMMONIA" in the last 168 hours.  ABG    Component Value Date/Time   PHART 7.41 02/09/2022 1242   PCO2ART 93 (HH) 02/09/2022 1242   PO2ART 70 (L) 02/09/2022 1242   HCO3 58.9 (H) 02/09/2022 1242   TCO2 28.9  04/27/2012 1422   O2SAT 94.8 02/09/2022 1242     Coagulation Profile: No results for input(s): "INR", "PROTIME" in the last 168 hours.  Cardiac Enzymes: No results for input(s): "CKTOTAL", "CKMB", "CKMBINDEX", "TROPONINI" in the last 168 hours.  HbA1C: No results found for: "HGBA1C"  CBG: Recent Labs  Lab 02/05/22 2059 02/07/22 0051 02/07/22 0356  GLUCAP 117* 75 85      Kara Mead  MD. FCCP. [facility] Pager : [phone]  If no response to pager , please call [phone] until 7 pm After 7:00 pm call Elink  [phone]   02/09/2022

## 2022-02-09 NOTE — TOC Progression Note (Addendum)
Transition of Care [facility]) - Progression Note    Patient Details  Name: Donna Watkins MRN: [id number] Date of Birth: [birth year]  Transition of Care [facility]) CM/SW Contact  Salome Arnt, [place] Phone Number: 02/09/2022, 11:40 AM  Clinical Narrative: Per MD, pt will need NIV. LCSW discussed with pt who requests [employer] as they provide her home O2. Referral made to [facility] with [employer]. Kristin requests ABG. MD notified.      Expected Discharge Plan: [place] Barriers to Discharge: Continued Medical Work up  Expected Discharge Plan and Services                                               Social Determinants of Health (SDOH) Interventions SDOH Screenings   Food Insecurity: No Food Insecurity (02/05/2022)  Housing: Low Risk  (02/05/2022)  Transportation Needs: No Transportation Needs (02/05/2022)  Utilities: Not At Risk (02/05/2022)  Alcohol Screen: Low Risk  (07/30/2021)  Depression (PHQ2-9): Low Risk  (08/12/2020)  Financial Resource Strain: Low Risk  (07/30/2021)  Physical Activity: Sufficiently Active (07/30/2021)  Social Connections: Socially Isolated (07/30/2021)  Stress: No Stress Concern Present (07/30/2021)  Tobacco Use: Medium Risk (11/26/2021)    Readmission Risk Interventions    02/06/2022    2:58 PM  Readmission Risk Prevention Plan  Transportation Screening Complete  Home Care Screening Complete  Medication Review (RN CM) Complete

## 2022-02-09 NOTE — Progress Notes (Signed)
Date and time results received: 02/09/22 1305 (use smartphrase ".now" to insert current time)  Test: ABG  Critical Value: PCO2: 93.0  Name of Provider Notified: Dr Roger Shelter  Orders Received? Or Actions Taken?:  No new orders currently

## 2022-02-09 NOTE — Progress Notes (Signed)
PROGRESS NOTE    Patient: Donna Watkins                            PCP: Lemmie Evens, MD                    DOB: September 13, 1960            DOA: 02/05/2022 KCL:[id number]             DOS: 02/09/2022, 9:41 AM   LOS: 4 days   Date of Service: The patient was seen and examined on 02/09/2022  Subjective:   The patient was seen and examined this morning, awake alert oriented, sitting in bed comfortable...     Brief Narrative:    Donna Watkins is a 63 y.o. female with medical history significant of anxiety, lung cancer, COPD, depression, GERD, hyperlipidemia, chronic respiratory failure with hypoxia requiring 3 L nasal cannula, and more presents ED with a chief complaint of dyspnea.  Most of her history is taken from Donna Watkins.  Patient is slow to answer questions and confused which is likely due to hypercapnia.  The sister reports that for 4 days patient has had dyspnea but it got acutely worse today.  She has been fatigued and sleeping all the time.  Her appetite has been intact.  She has been coughing and it sounds wet but she has not seen anything come up.  Patient has been using her breathing treatments and her rescue inhaler, but sister thinks that she is probably not using them as often as she should be.  Patient has been confused for the whole week per the sister.  No further history could be obtained at this time due to patient's confusion.   Patient does not smoke, does not drink.  She is full code.    Assessment & Plan:   Principal Problem:   Acute on chronic respiratory failure with hypoxia (HCC) Active Problems:   Hypotensive episode   Acute exacerbation of chronic obstructive pulmonary disease (COPD) (HCC)   End stage COPD (HCC)   Mixed hyperlipidemia   Anxiety   Respiratory acidosis     Assessment and Plan: * Acute on chronic respiratory failure with hypoxia (HCC) - Base line: 3 L nasal cannula at home - Was successfully weaned off continuous BiPAP,  -  tolerated BiPAP overnight --  -VBG at 92 -This morning stable on 3 L of oxygen, satting 96%   -On arrival in ED : she was initially satting in 70s, but improved to 90s on 6 L - On BiPAP due to hypercapnia: PCO 104 - Secondary to COPD exacerbation - COVID, flu, RSV negative - Chest x-ray shows no acute process - Continue to monitor  Hypotensive episode - was hypotensive overnight, BP as low as 80/47 currently 109/52 -Status post IV fluid resuscitation, and maintenance fluids -Monitoring BP closely-  stable today    Respiratory acidosis - improved  -Continuing supplemental oxygen, weaning off BiPAP - POA: PCO2 greater than 120 >>92  - Secondary to COPD exacerbation - See treatment plan for COPD exacerbation  Anxiety - Continue of Xanax given COPD exacerbation -The patient and family takes Xanax 1 mg up to 4 times a day Will taper down 0.5 mg TID today   -Discussed the side effect of benzodiazepine dependence and withdrawal -Agreed to slowly taper down  -On this admission when in respiratory failure patient received IV Ativan - cont PRN  Ativan   Mixed hyperlipidemia - Continue statin   End stage COPD ([place]) - treatment as above  - Dr. Melvyn Novas -pulmonologist was following -Discussed with the sister patient remains full code Full scope of care to be continued  Acute exacerbation of chronic obstructive pulmonary disease (COPD) ([place]) - Acute on chronic respiratory failure, with significant hypercapnia-altered mental status  -Much improved on BiPAP overnight - tolerated -last VBG still reporting pCO2 of 92  -   Will need trilogy machine at home use  -   Tapering down steroids  -Off continuous BiPAP this AM, currently on 3 L, satting 96% -It is clear the patient may benefit from trilogy, BiPAP at night.  -Continue pulmonary toiletry, DuoNeb bronchodilator treatment scheduled, IV steroids  - Pulm. Dr. Melvyn Novas was consulted,  Dr. Elsworth Soho following  - Continue as needed nebs  and scheduled Atrovent -In ED: ABG: pH 7.29, pCO2 123, bicarb greater than 45 - on BiPAP over night --- will need it nightly   - Sputum culture - NGTD - Chest x-ray shows no acute process - COVID, flu, RSV negative - Initiating empiric p.o. antibiotics of azithromycin (due to cough, COPD exacerbation, sputum production)     ------------------------------------------------------------------------------------------------------------------------------------------------  DVT prophylaxis:  enoxaparin (LOVENOX) injection 40 mg Start: 02/07/22 2200 SCDs Start: 02/05/22 2051   Code Status:   Code Status: Full Code  Family Communication: Sister at bedside updated The above findings and plan of care has been discussed with patient (and family)  in detail,  they expressed understanding and agreement of above. -Advance care planning has been discussed.   Admission status:   Status is: Inpatient Remains inpatient appropriate because: acute on Chronic respiratory failure ... Needing BIPAP   Disposition: From  - home             Planning for discharge in 1-2 days: to Home   Procedures:   No admission procedures for hospital encounter.   Antimicrobials:  Anti-infectives (From admission, onward)    Start     Dose/Rate Route Frequency Ordered Stop   02/07/22 1130  azithromycin (ZITHROMAX) tablet 500 mg        500 mg Oral Daily 02/07/22 1031          Medication:   ALPRAZolam  0.5 mg Oral TID   atorvastatin  20 mg Oral Daily   azithromycin  500 mg Oral Daily   Chlorhexidine Gluconate Cloth  6 each Topical Daily   enoxaparin (LOVENOX) injection  40 mg Subcutaneous Q24H   ipratropium  0.5 mg Nebulization TID   levalbuterol  1.25 mg Nebulization TID   methylPREDNISolone sodium succinate  40 mg Intravenous Q12H   pantoprazole  40 mg Oral Daily    acetaminophen **OR** acetaminophen, albuterol, LORazepam, morphine injection, ondansetron **OR** ondansetron (ZOFRAN) IV,  oxyCODONE   Objective:   Vitals:   02/09/22 0745 02/09/22 0847 02/09/22 0850 02/09/22 0900  BP:   (!) 106/59 (!) 106/54  Pulse: 85 83 81 (!) 104  Resp: 17 20    Temp:      TempSrc:      SpO2: 95% 99% 99% 93%  Weight:      Height:        Intake/Output Summary (Last 24 hours) at 02/09/2022 0941 Last data filed at 02/09/2022 0400 Gross per 24 hour  Intake 720 ml  Output 1350 ml  Net -630 ml   Filed Weights   02/05/22 1619 02/05/22 2107 02/07/22 0357  Weight: 43.1 kg 42.7 kg 49.1 kg  Physical examination:    General:  AAO x 3,  cooperative, no distress at rest, SOB   HEENT:  Normocephalic, PERRL, otherwise with in Normal limits   Neuro:  CNII-XII intact. , normal motor and sensation, reflexes intact   Lungs:   Clear to auscultation BL, Respirations unlabored,  No wheezes / crackles  Cardio:    S1/S2, RRR, No murmure, No Rubs or Gallops   Abdomen:  Soft, non-tender, bowel sounds active all four quadrants, no guarding or peritoneal signs.  Muscular  skeletal:  Limited exam -global generalized weaknesses - in bed, able to move all 4 extremities,   2+ pulses,  symmetric, No pitting edema  Skin:  Dry, warm to touch, negative for any Rashes,  Wounds: Please see nursing documentation           ---------------------------------------------------------------------------------------------------------------------------    LABs:     Latest Ref Rng & Units 02/08/2022    7:12 AM 02/06/2022    4:35 AM 02/05/2022    4:26 PM  CBC  WBC 4.0 - 10.5 K/uL 3.5  5.7  6.5   Hemoglobin 12.0 - 15.0 g/dL 10.5  13.0  13.6   Hematocrit 36.0 - 46.0 % 34.9  45.5  48.4   Platelets 150 - 400 K/uL 80  105  105       Latest Ref Rng & Units 02/09/2022    4:30 AM 02/08/2022    4:57 AM 02/07/2022    3:51 AM  CMP  Glucose 70 - 99 mg/dL 125  121  87   BUN 6 - 20 mg/dL 14  18  24    Creatinine 0.44 - 1.00 mg/dL 0.32  0.32  0.50   Sodium 135 - 145 mmol/L 141  142  144   Potassium 3.5 - 5.1 mmol/L  3.7  4.5  4.8   Chloride 98 - 111 mmol/L 89  91  84   CO2 22 - 32 mmol/L 42  42  >45   Calcium 8.9 - 10.3 mg/dL 8.7  8.5  9.4        Micro Results Recent Results (from the past 240 hour(s))  Resp panel by RT-PCR (RSV, Flu A&B, Covid) Anterior Nasal Swab     Status: None   Collection Time: 02/05/22  4:25 PM   Specimen: Anterior Nasal Swab  Result Value Ref Range Status   SARS Coronavirus 2 by RT PCR NEGATIVE NEGATIVE Final    Comment: (NOTE) SARS-CoV-2 target nucleic acids are NOT DETECTED.  The SARS-CoV-2 RNA is generally detectable in upper respiratory specimens during the acute phase of infection. The lowest concentration of SARS-CoV-2 viral copies this assay can detect is 138 copies/mL. A negative result does not preclude SARS-Cov-2 infection and should not be used as the sole basis for treatment or other patient management decisions. A negative result may occur with  improper specimen collection/handling, submission of specimen other than nasopharyngeal swab, presence of viral mutation(s) within the areas targeted by this assay, and inadequate number of viral copies(<138 copies/mL). A negative result must be combined with clinical observations, patient history, and epidemiological information. The expected result is Negative.  Fact Sheet for Patients:  EntrepreneurPulse.com.au  Fact Sheet for Healthcare Providers:  IncredibleEmployment.be  This test is no t yet approved or cleared by the [place] FDA and  has been authorized for detection and/or diagnosis of SARS-CoV-2 by FDA under an Emergency Use Authorization (EUA). This EUA will remain  in effect (meaning this test can be used) for the  duration of the COVID-19 declaration under Section 564(b)(1) of the Act, 21 U.S.C.section 360bbb-3(b)(1), unless the authorization is terminated  or revoked sooner.       Influenza A by PCR NEGATIVE NEGATIVE Final   Influenza B by PCR  NEGATIVE NEGATIVE Final    Comment: (NOTE) The Xpert Xpress SARS-CoV-2/FLU/RSV plus assay is intended as an aid in the diagnosis of influenza from Nasopharyngeal swab specimens and should not be used as a sole basis for treatment. Nasal washings and aspirates are unacceptable for Xpert Xpress SARS-CoV-2/FLU/RSV testing.  Fact Sheet for Patients: EntrepreneurPulse.com.au  Fact Sheet for Healthcare Providers: IncredibleEmployment.be  This test is not yet approved or cleared by the [place] FDA and has been authorized for detection and/or diagnosis of SARS-CoV-2 by FDA under an Emergency Use Authorization (EUA). This EUA will remain in effect (meaning this test can be used) for the duration of the COVID-19 declaration under Section 564(b)(1) of the Act, 21 U.S.C. section 360bbb-3(b)(1), unless the authorization is terminated or revoked.     Resp Syncytial Virus by PCR NEGATIVE NEGATIVE Final    Comment: (NOTE) Fact Sheet for Patients: EntrepreneurPulse.com.au  Fact Sheet for Healthcare Providers: IncredibleEmployment.be  This test is not yet approved or cleared by the [place] FDA and has been authorized for detection and/or diagnosis of SARS-CoV-2 by FDA under an Emergency Use Authorization (EUA). This EUA will remain in effect (meaning this test can be used) for the duration of the COVID-19 declaration under Section 564(b)(1) of the Act, 21 U.S.C. section 360bbb-3(b)(1), unless the authorization is terminated or revoked.  Performed at [facility], [address] [place], [place] [address]   MRSA Next Gen by PCR, Nasal     Status: None   Collection Time: 02/05/22  8:52 PM   Specimen: Nasal Mucosa; Nasal Swab  Result Value Ref Range Status   MRSA by PCR Next Gen NOT DETECTED NOT DETECTED Final    Comment: (NOTE) The GeneXpert MRSA Assay (FDA approved for NASAL specimens only), is one  component of a comprehensive MRSA colonization surveillance program. It is not intended to diagnose MRSA infection nor to guide or monitor treatment for MRSA infections. Test performance is not FDA approved in patients less than 65 years old. Performed at [facility], [address], [place],  [address]     Radiology Reports No results found.  SIGNED: Deatra James, MD, FHM. FAAFP. Zacarias Pontes - [facility] Time spent > 55 min.  Critical care time was spent in seeing, evaluating and examining the patient. Reviewing medical records, labs, drawn plan of care. [facility],  Pager (please use [email or web address] to page/ text) Please use Epic Secure Chat for non-urgent communication (7AM-7PM)  If 7PM-7AM, please contact night-coverage [email or web address], 02/09/2022, 9:41 AM

## 2022-02-09 NOTE — Progress Notes (Signed)
  Acute on chronic respiratory failure due to sever end stage COPD - COPD exacerbation ABG    Component Value Date/Time   PHART 7.41 02/09/2022 1242   PCO2ART 93 (HH) 02/09/2022 1242   PO2ART 70 (L) 02/09/2022 1242   HCO3 58.9 (H) 02/09/2022 1242   TCO2 28.9 04/27/2012 1422   O2SAT 94.8 02/09/2022 1242     -Pt has chronic respiratory failure with hypoxia and hypercapnia secondary to severe COPD. Pt requires frequent durations of respiratory support and deteriorates quickly in the absence of non-invasive mechanical ventilator. Patient will also benefit from mouthpiece ventilation, and requires alarms to alert caregiver and patient of a respiratory crisis, alarms only present on NIV not Bipap. BIPAP has been considered but has been ruled-out and insufficient. Auto EPAP on NIV will adjust pressure if it senses airway closure. Bipap does not have this Auto Epap function. Failure to add an Auto EPAP min-max could result in desaturations and/or a respiratory event even on her prescribed oxygen. Auto Epap will maintain a patent airway. Pt's PC02 was 93 on 02/09/2022 (as high as 104 on the hospital).  Interruption or failure to provide NIMV would quickly lead to exacerbation of the patient's condition, lead to hospitalization and likely harm the patient. Continued use of the NIMV is preferred. Patient can clear secretions and protect their airway without ventilator there is significant risk of decline in health status with worsening of condition and increased risk of CO2 retention with untimely readmissions to the hospital. Based on these concerns we are working with the durable medical equipment company to look into initiation of noninvasive home ventilation     SIGNED: Deatra James, MD, FHM. FAAFP [facility],  Pager (please use [email or web address] to page/text)  Please use Epic Secure Chat for non-urgent communication (7AM-7PM) If 7PM-7AM, please contact night-coverage [email or web address],  02/09/2022, 1:48  PM

## 2022-02-10 DIAGNOSIS — J9621 Acute and chronic respiratory failure with hypoxia: Secondary | ICD-10-CM | POA: Diagnosis not present

## 2022-02-10 LAB — BASIC METABOLIC PANEL
BUN: 17 mg/dL (ref 6–20)
CO2: >45 mmol/L — ABNORMAL HIGH (ref 22–32)
Calcium: 8.7 mg/dL — ABNORMAL LOW (ref 8.9–10.3)
Chloride: 89 mmol/L — ABNORMAL LOW (ref 98–111)
Creatinine, Ser: 0.33 mg/dL — ABNORMAL LOW (ref 0.44–1.00)
GFR, Estimated: >60 mL/min (ref >60)
Glucose, Bld: 95 mg/dL (ref 70–99)
Potassium: 4.1 mmol/L (ref 3.5–5.1)
Sodium: 142 mmol/L (ref 135–145)

## 2022-02-10 MED ORDER — METHYLPREDNISOLONE 4 MG PO TBPK: ORAL_TABLET | ORAL | 0 refills | Status: DC

## 2022-02-10 MED ORDER — PREDNISONE 20 MG PO TABS
50.0000 mg | ORAL_TABLET | Freq: Every day | ORAL | Status: DC
  Administered 2022-02-10: 50 mg via ORAL
  Filled 2022-02-10: qty 1

## 2022-02-10 MED ORDER — FLUCONAZOLE 100 MG PO TABS: 100.0000 mg | ORAL_TABLET | Freq: Every day | ORAL | 0 refills | Status: AC

## 2022-02-10 MED ORDER — LACTINEX PO CHEW: 1.0000 | CHEWABLE_TABLET | Freq: Three times a day (TID) | ORAL | 0 refills | Status: AC

## 2022-02-10 MED ORDER — AZITHROMYCIN 500 MG PO TABS: 500.0000 mg | ORAL_TABLET | Freq: Every day | ORAL | 0 refills | Status: AC

## 2022-02-10 MED ORDER — BUDESONIDE 0.25 MG/2ML IN SUSP: 0.2500 mg | Freq: Two times a day (BID) | RESPIRATORY_TRACT | Status: DC

## 2022-02-10 NOTE — Care Management Important Message (Signed)
Important Message  Patient Details  Name: Donna Watkins MRN: [id number] Date of Birth: 1960/08/24   Medicare Important Message Given:  Yes (copy left on bedside table)     Tommy Medal 02/10/2022, 11:09 AM

## 2022-02-10 NOTE — Discharge Summary (Signed)
Physician Discharge Summary   Patient: Donna Watkins MRN: [id number] DOB: September 24, 1960  Admit date:     02/05/2022  Discharge date: 02/10/22  Discharge Physician: Deatra James   PCP: Lemmie Evens, MD   Recommendations at discharge:  Follow-up with pulmonologist in 1 week -Continue your home BiPAP machine at night, and continuously wear your oxygen -Maintain oxygen level saturation at 88-92%  Discharge Diagnoses: Principal Problem:   Acute on chronic respiratory failure with hypoxia ([place]) Active Problems:   Hypotensive episode   Acute exacerbation of chronic obstructive pulmonary disease (COPD) (HCC)   End stage COPD ([place])   Mixed hyperlipidemia   Anxiety   Respiratory acidosis  Resolved Problems:   * No resolved hospital problems. *  Hospital Course:  Donna Watkins is a 62 y.o. female with medical history significant of anxiety, lung cancer, COPD, depression, GERD, hyperlipidemia, chronic respiratory failure with hypoxia requiring 3 L nasal cannula, and more presents ED with a chief complaint of dyspnea.  Most of her history is taken from [facility].  Patient is slow to answer questions and confused which is likely due to hypercapnia.  The sister reports that for 4 days patient has had dyspnea but it got acutely worse today.  She has been fatigued and sleeping all the time.  Her appetite has been intact.  She has been coughing and it sounds wet but she has not seen anything come up.  Patient has been using her breathing treatments and her rescue inhaler, but sister thinks that she is probably not using them as often as she should be.  Patient has been confused for the whole week per the sister.  No further history could be obtained at this time due to patient's confusion.   Patient does not smoke, does not drink.  She is full code.  Assessment and Plan: * Acute on chronic respiratory failure with hypoxia (HCC) - Base line: 3 L nasal cannula at home - Was  successfully weaned off continuous BiPAP,  - tolerated BiPAP overnight --  Approved for trilogy machine due to end-stage COPD-CO2 retention  -VBG at 92 -This morning stable on 3 L of oxygen, satting 96%   -On arrival in ED : she was initially satting in 70s, but improved to 90s on 6 L - On BiPAP due to hypercapnia: PCO 104 - Secondary to COPD exacerbation - COVID, flu, RSV negative - Chest x-ray shows no acute process - Continue to monitor  Hypotensive episode - was hypotensive overnight, BP as low as 80/47 currently 109/52 -Status post IV fluid resuscitation, and maintenance fluids -Monitoring BP closely-  stable today    Respiratory acidosis - improved  -Continuing supplemental oxygen, weaning off BiPAP -Continue BiPAP nightly, continuous 3 L of oxygen via nasal cannula - POA: PCO2 greater than 120 >>92  - Secondary to COPD exacerbation - See treatment plan for COPD exacerbation  Anxiety - Continue of Xanax given COPD exacerbation -The patient and family takes Xanax 1 mg up to 4 times a day Will taper down 0.5 mg TID today   -Discussed the side effect of benzodiazepine dependence and withdrawal -Agreed to slowly taper down  -On this admission when in respiratory failure patient received IV Ativan - cont PRN Ativan   Mixed hyperlipidemia - Continue statin   End stage COPD ([place]) - treatment as above  - Dr. Melvyn Novas -pulmonologist was following -Discussed with the sister patient remains full code Full scope of care to be continued  Acute  exacerbation of chronic obstructive pulmonary disease (COPD) (HCC) - Acute on chronic respiratory failure, with significant hypercapnia-altered mental status  -Much improved on BiPAP overnight - tolerated -last VBG still reporting pCO2 of 92  -   Will need trilogy machine at home use  -   Tapering down steroids  -Off continuous BiPAP this AM, currently on 3 L, satting 96% -It is clear the patient may benefit from trilogy, BiPAP  at night.  -Continue pulmonary toiletry, DuoNeb bronchodilator treatment scheduled, IV steroids  - Pulm. Dr. Melvyn Novas was consulted,  Dr. Elsworth Soho following  - Continue as needed nebs and scheduled Atrovent -In ED: ABG: pH 7.29, pCO2 123, bicarb greater than 45 - on BiPAP over night --- will need it nightly   - Sputum culture - NGTD - Chest x-ray shows no acute process - COVID, flu, RSV negative - Initiating empiric p.o. antibiotics of azithromycin (due to cough, COPD exacerbation, sputum production)     The patient has chronic respiratory failure with hypoxia and hypercapnia secondary to severe COPD. Pt requires frequent durations of respiratory support and deteriorates quickly in the absence of non-invasive mechanical ventilator. Patient will also benefit from mouthpiece ventilation, and requires alarms to alert caregiver and patient of a respiratory crisis, alarms only present on NIV not Bipap. BIPAP has been considered but has been ruled-out and insufficient. Auto EPAP on NIV will adjust pressure if it senses airway closure. Bipap does not have this Auto Epap function. Failure to add an Auto EPAP min-max could result in desaturations and/or a respiratory event even on her prescribed oxygen. Auto Epap will maintain a patent airway. Pt's PC02 was 93 on 02/09/2022 (as high as 104 on the hospital).  Interruption or failure to provide NIMV would quickly lead to exacerbation of the patient's condition, lead to hospitalization and likely harm the patient. Continued use of the NIMV is preferred. Patient can clear secretions and protect their airway without ventilator there is significant risk of decline in health status with worsening of condition and increased risk of CO2 retention with untimely readmissions to the hospital. Based on these concerns we are working with the durable medical equipment company to look into initiation of noninvasive home ventilation        Consultants: PCCM Dr. Elsworth Soho, Dr.  Melvyn Novas  Disposition: Home Diet recommendation:  Discharge Diet Orders (From admission, onward)     Start     Ordered   02/10/22 0000  Diet - low sodium heart healthy        02/10/22 1139           Cardiac diet DISCHARGE MEDICATION: Allergies as of 02/10/2022   No Known Allergies      Medication List     STOP taking these medications    doxycycline 100 MG tablet Commonly known as: VIBRA-TABS       TAKE these medications    albuterol 108 (90 Base) MCG/ACT inhaler Commonly known as: VENTOLIN HFA Inhale 2 puffs into the lungs every 4 (four) hours as needed for wheezing or shortness of breath.   albuterol (2.5 MG/3ML) 0.083% nebulizer solution Commonly known as: PROVENTIL Take 2.5 mg by nebulization See admin instructions. 2.5 mg by nebulization five times daily   ALPRAZolam 1 MG tablet Commonly known as: XANAX Take 1 tablet (1 mg total) by mouth 3 (three) times daily as needed for anxiety.   atorvastatin 20 MG tablet Commonly known as: LIPITOR Take 20 mg by mouth daily.   azithromycin 500 MG tablet  Commonly known as: Zithromax Take 1 tablet (500 mg total) by mouth daily for 5 days. Take 1 tablet daily for 3 days.   CENTRUM SILVER PO Take 1 tablet by mouth daily.   fluconazole 100 MG tablet Commonly known as: DIFLUCAN Take 1 tablet (100 mg total) by mouth daily for 5 days.   ibuprofen 200 MG tablet Commonly known as: ADVIL Take 200 mg by mouth every 6 (six) hours as needed for mild pain.   ipratropium 0.02 % nebulizer solution Commonly known as: ATROVENT Take 0.5 mg by nebulization See admin instructions. Use one vial by nebulization five times daily   ipratropium-albuterol 0.5-2.5 (3) MG/3ML Soln Commonly known as: DUONEB Take 3 mLs by nebulization 3 (three) times daily. And as needed for SOB   lactobacillus acidophilus & bulgar chewable tablet Chew 1 tablet by mouth 3 (three) times daily with meals for 5 days.   methylPREDNISolone 4 MG Tbpk  tablet Commonly known as: MEDROL DOSEPAK Medrol Dosepak take as instructed   omeprazole 20 MG capsule Commonly known as: PRILOSEC Take 20 mg by mouth daily.   OXYGEN Place 3 L into the nose continuous.   traZODone 50 MG tablet Commonly known as: DESYREL Take 50 mg by mouth at bedtime.        Discharge Exam: Filed Weights   02/05/22 1619 02/05/22 2107 02/07/22 0357  Weight: 43.1 kg 42.7 kg 49.1 kg        General:  AAO x 3,  cooperative, no distress; SOB   HEENT:  Normocephalic, PERRL, otherwise with in Normal limits   Neuro:  CNII-XII intact. , normal motor and sensation, reflexes intact   Lungs:   Clear to auscultation BL, Respirations unlabored,  No wheezes / crackles  Cardio:    S1/S2, RRR, No murmure, No Rubs or Gallops   Abdomen:  Soft, non-tender, bowel sounds active all four quadrants, no guarding or peritoneal signs.  Muscular  skeletal:  Limited exam -global generalized weaknesses - in bed, able to move all 4 extremities,   2+ pulses,  symmetric, No pitting edema  Skin:  Dry, warm to touch, negative for any Rashes,  Wounds: Please see nursing documentation         Condition at discharge: fair  The results of significant diagnostics from this hospitalization (including imaging, microbiology, ancillary and laboratory) are listed below for reference.   Imaging Studies: DG CHEST PORT 1 VIEW  Result Date: 02/09/2022 CLINICAL DATA:  Sob, hx of lung ca and COPD EXAM: PORTABLE CHEST - 1 VIEW COMPARISON:  02/05/2022 FINDINGS: Attenuated upper lobe bronchovascular markings, and bibasilar linear scarring as before. No new infiltrate or overt edema. Heart size and mediastinal contours are within normal limits. No effusion. Visualized bones unremarkable. Metallic clip in the left upper lung as before. IMPRESSION: Chronic changes as above. No acute findings. Electronically Signed   By: Lucrezia Europe M.D.   On: 02/09/2022 12:57   DG Chest Portable 1 View  Result Date:  02/05/2022 CLINICAL DATA:  Shortness of breath EXAM: PORTABLE CHEST 1 VIEW COMPARISON:  Chest x-ray 11/25/2021.  Chest CT 01/12/2022. FINDINGS: Cardiomediastinal silhouette is within normal limits. The lungs are hyperinflated compatible with COPD, unchanged. There is a nodular density in the right lower lung measuring 1 cm similar to prior CT. There is linear atelectasis or scarring in the left lung base. There is no pleural effusion or pneumothorax. No new focal lung infiltrate. No acute fractures. IMPRESSION: 1. No acute cardiopulmonary process. 2. COPD. 3.  Nodular density in the right lower lung, similar to prior CT. Electronically Signed   By: Ronney Asters M.D.   On: 02/05/2022 18:09    Microbiology: Results for orders placed or performed during the hospital encounter of 02/05/22  Resp panel by RT-PCR (RSV, Flu A&B, Covid) Anterior Nasal Swab     Status: None   Collection Time: 02/05/22  4:25 PM   Specimen: Anterior Nasal Swab  Result Value Ref Range Status   SARS Coronavirus 2 by RT PCR NEGATIVE NEGATIVE Final    Comment: (NOTE) SARS-CoV-2 target nucleic acids are NOT DETECTED.  The SARS-CoV-2 RNA is generally detectable in upper respiratory specimens during the acute phase of infection. The lowest concentration of SARS-CoV-2 viral copies this assay can detect is 138 copies/mL. A negative result does not preclude SARS-Cov-2 infection and should not be used as the sole basis for treatment or other patient management decisions. A negative result may occur with  improper specimen collection/handling, submission of specimen other than nasopharyngeal swab, presence of viral mutation(s) within the areas targeted by this assay, and inadequate number of viral copies(<138 copies/mL). A negative result must be combined with clinical observations, patient history, and epidemiological information. The expected result is Negative.  Fact Sheet for Patients:   EntrepreneurPulse.com.au  Fact Sheet for Healthcare Providers:  IncredibleEmployment.be  This test is no t yet approved or cleared by the [place] FDA and  has been authorized for detection and/or diagnosis of SARS-CoV-2 by FDA under an Emergency Use Authorization (EUA). This EUA will remain  in effect (meaning this test can be used) for the duration of the COVID-19 declaration under Section 564(b)(1) of the Act, 21 U.S.C.section 360bbb-3(b)(1), unless the authorization is terminated  or revoked sooner.       Influenza A by PCR NEGATIVE NEGATIVE Final   Influenza B by PCR NEGATIVE NEGATIVE Final    Comment: (NOTE) The Xpert Xpress SARS-CoV-2/FLU/RSV plus assay is intended as an aid in the diagnosis of influenza from Nasopharyngeal swab specimens and should not be used as a sole basis for treatment. Nasal washings and aspirates are unacceptable for Xpert Xpress SARS-CoV-2/FLU/RSV testing.  Fact Sheet for Patients: EntrepreneurPulse.com.au  Fact Sheet for Healthcare Providers: IncredibleEmployment.be  This test is not yet approved or cleared by the [place] FDA and has been authorized for detection and/or diagnosis of SARS-CoV-2 by FDA under an Emergency Use Authorization (EUA). This EUA will remain in effect (meaning this test can be used) for the duration of the COVID-19 declaration under Section 564(b)(1) of the Act, 21 U.S.C. section 360bbb-3(b)(1), unless the authorization is terminated or revoked.     Resp Syncytial Virus by PCR NEGATIVE NEGATIVE Final    Comment: (NOTE) Fact Sheet for Patients: EntrepreneurPulse.com.au  Fact Sheet for Healthcare Providers: IncredibleEmployment.be  This test is not yet approved or cleared by the [place] FDA and has been authorized for detection and/or diagnosis of SARS-CoV-2 by FDA under an Emergency Use  Authorization (EUA). This EUA will remain in effect (meaning this test can be used) for the duration of the COVID-19 declaration under Section 564(b)(1) of the Act, 21 U.S.C. section 360bbb-3(b)(1), unless the authorization is terminated or revoked.  Performed at [facility], [address], [place], [place] [address]   MRSA Next Gen by PCR, Nasal     Status: None   Collection Time: 02/05/22  8:52 PM   Specimen: Nasal Mucosa; Nasal Swab  Result Value Ref Range Status   MRSA by PCR Next  Gen NOT DETECTED NOT DETECTED Final    Comment: (NOTE) The GeneXpert MRSA Assay (FDA approved for NASAL specimens only), is one component of a comprehensive MRSA colonization surveillance program. It is not intended to diagnose MRSA infection nor to guide or monitor treatment for MRSA infections. Test performance is not FDA approved in patients less than 76 years old. Performed at [facility], [address], [place], [place] [address]     Labs: CBC: Recent Labs  Lab 02/05/22 1626 02/06/22 0435 02/08/22 0712  WBC 6.5 5.7 3.5*  NEUTROABS 5.8 5.2 3.2  HGB 13.6 13.0 10.5*  HCT 48.4* 45.5 34.9*  MCV 113.1* 111.5* 105.1*  PLT 105* 105* 80*   Basic Metabolic Panel: Recent Labs  Lab 02/06/22 0435 02/07/22 0351 02/08/22 0457 02/09/22 0430 02/10/22 0757  NA 143 144 142 141 142  K 4.3 4.8 4.5 3.7 4.1  CL 80* 84* 91* 89* 89*  CO2 >45* >45* 42* 42* >45*  GLUCOSE 114* 87 121* 125* 95  BUN 23* 24* 18 14 17   CREATININE 0.37* 0.50 0.32* 0.32* 0.33*  CALCIUM 9.2 9.4 8.5* 8.7* 8.7*  MG 1.8  --   --   --   --    Liver Function Tests: Recent Labs  Lab 02/05/22 1626 02/06/22 0435  AST 30 26  ALT 38 33  ALKPHOS 60 57  BILITOT 0.7 0.9  PROT 6.8 6.4*  ALBUMIN 4.0 3.9   CBG: Recent Labs  Lab 02/05/22 2059 02/07/22 0051 02/07/22 0356  GLUCAP 117* 75 85    Discharge time spent: greater than 30 minutes.  Signed: Deatra James, MD [facility] 02/10/2022

## 2022-02-10 NOTE — Progress Notes (Signed)
Physical Therapy Treatment Patient Details Name: Donna Watkins MRN: [id number] DOB: 1960/07/27 Today's Date: 02/10/2022   History of Present Illness Per Dr. Roger Shelter, " Donna Watkins is a 62 y.o. female with medical history significant of anxiety, lung cancer, COPD, depression, GERD, hyperlipidemia, chronic respiratory failure with hypoxia requiring 3 L nasal cannula, and more presents ED with a chief complaint of dyspnea.   Most of her history is taken from Donna Watkins.  Patient is slow to answer questions and confused which is likely due to hypercapnia.  The sister reports that for 4 days patient has had dyspnea but it got acutely worse today.  She has been fatigued and sleeping all the time.  Her appetite has been intact.  She has been coughing and it sounds wet but she has not seen anything come up.  Patient has been using her breathing treatments and her rescue inhaler, but sister thinks that she is probably not using them as often as she should be.  Patient has been confused for the whole week per the sister    PT Comments    Patient tolerated today's session well with progressive out of bed ambulation distance.  Patient demonstrating reduced anticipatory balance strategies during ambulation with multiple noted lateral/sagittal swaying without UE support during ambulation trials.  Patient reports the sways are due to her not being able to move a lot.  Noted increased visual dependence during ambulation and mobility about room with constant downward visual tracking.  Follow-up recommendations to change with home health PT services to assist with navigating home environment from home health PT with functional balance deficits noted.  Patient would continue to benefit from skilled physical therapy services to address balance deficits and improve dynamic balance reactions and ambulation for safe DC home.  Recommendations for follow up therapy are one component of a multi-disciplinary  discharge planning process, led by the attending physician.  Recommendations may be updated based on patient status, additional functional criteria and insurance authorization.  Follow Up Recommendations  Home health PT     Assistance Recommended at Discharge Intermittent Supervision/Assistance  Patient can return home with the following A little help with walking and/or transfers   Equipment Recommendations  Other (comment)    Recommendations for Other Services       Precautions / Restrictions Precautions Precautions: Fall Precaution Comments: PT unsteady on feet without AD Restrictions Weight Bearing Restrictions: No     Mobility  Bed Mobility Overal bed mobility: Independent               Patient Response: Cooperative  Transfers Overall transfer level: Independent                      Ambulation/Gait Ambulation/Gait assistance: Min guard, Supervision Gait Distance (Feet): 200 Feet Assistive device: None, Rolling walker (2 wheels) Gait Pattern/deviations: Decreased step length - left, Decreased step length - right       General Gait Details: Definite dynamic balance deficits noted throughout ambulation trials.  200 feet total, 195 feet without AD and 5 with RW.  Patient demonstrates creased lateral sway with BLE crossing midline.  Patient with increased visual gaze and tracking various objects throughout ICU hall noting increased lateral swelling without downward visual cues to floor.  Heavy visual requirements to maintain balance.  Attempted last 5 feet in room with RW patient with minor improvements in balance.  Patient reports not desiring to use RW.   Stairs  Wheelchair Mobility    Modified Rankin (Stroke Patients Only)       Balance Overall balance assessment: Needs assistance Sitting-balance support: No upper extremity supported, Feet supported Sitting balance-Leahy Scale: Good Sitting balance - Comments: Good sitting  balance no major concerns.   Standing balance support: No upper extremity supported, Bilateral upper extremity supported, During functional activity Standing balance-Leahy Scale: Fair Standing balance comment: Fair standing balance without AD good standing balance with AD.                            Cognition Arousal/Alertness: Awake/alert Behavior During Therapy: WFL for tasks assessed/performed Overall Cognitive Status: Within Functional Limits for tasks assessed                                          Exercises General Exercises - Lower Extremity Long Arc Quad: AROM, 10 reps, Both    General Comments        Pertinent Vitals/Pain Pain Assessment Pain Assessment: No/denies pain    Home Living                          Prior Function            PT Goals (current goals can now be found in the care plan section) Acute Rehab PT Goals Patient Stated Goal: to go home PT Goal Formulation: With patient Time For Goal Achievement: 02/10/22 Potential to Achieve Goals: Good Progress towards PT goals: Progressing toward goals    Frequency    Min 3X/week      PT Plan Discharge plan needs to be updated    Co-evaluation              AM-PAC PT "6 Clicks" Mobility   Outcome Measure  Help needed turning from your back to your side while in a flat bed without using bedrails?: None Help needed moving from lying on your back to sitting on the side of a flat bed without using bedrails?: None Help needed moving to and from a bed to a chair (including a wheelchair)?: None Help needed standing up from a chair using your arms (e.g., wheelchair or bedside chair)?: None Help needed to walk in hospital room?: A Little Help needed climbing 3-5 steps with a railing? : A Lot 6 Click Score: 21    End of Session Equipment Utilized During Treatment: Gait belt Activity Tolerance: Patient tolerated treatment well Patient left: in chair;with  call bell/phone within reach Nurse Communication: Mobility status PT Visit Diagnosis: Unsteadiness on feet (R26.81);Other abnormalities of gait and mobility (R26.89)     Time: 2440-1027 PT Time Calculation (min) (ACUTE ONLY): 25 min  Charges:  $Gait Training: 23-37 mins                     Wonda Olds PT, DPT Physical Therapist with [place] [phone] office    Wonda Olds 02/10/2022, 10:48 AM

## 2022-02-10 NOTE — Progress Notes (Signed)
Nsg Discharge Note  Admit Date:  02/05/2022 Discharge date: 02/10/2022   Donna Watkins to be D/C'd Home per MD order.  AVS completed.  Copy for chart, and copy for patient signed, and dated. Patient/caregiver able to verbalize understanding. D/C home with NIV machine.  Discharge Medication: Allergies as of 02/10/2022   No Known Allergies      Medication List     STOP taking these medications    doxycycline 100 MG tablet Commonly known as: VIBRA-TABS       TAKE these medications    albuterol 108 (90 Base) MCG/ACT inhaler Commonly known as: VENTOLIN HFA Inhale 2 puffs into the lungs every 4 (four) hours as needed for wheezing or shortness of breath.   albuterol (2.5 MG/3ML) 0.083% nebulizer solution Commonly known as: PROVENTIL Take 2.5 mg by nebulization See admin instructions. 2.5 mg by nebulization five times daily   ALPRAZolam 1 MG tablet Commonly known as: XANAX Take 1 tablet (1 mg total) by mouth 3 (three) times daily as needed for anxiety.   atorvastatin 20 MG tablet Commonly known as: LIPITOR Take 20 mg by mouth daily.   azithromycin 500 MG tablet Commonly known as: Zithromax Take 1 tablet (500 mg total) by mouth daily for 5 days. Take 1 tablet daily for 3 days.   CENTRUM SILVER PO Take 1 tablet by mouth daily.   fluconazole 100 MG tablet Commonly known as: DIFLUCAN Take 1 tablet (100 mg total) by mouth daily for 5 days.   ibuprofen 200 MG tablet Commonly known as: ADVIL Take 200 mg by mouth every 6 (six) hours as needed for mild pain.   ipratropium 0.02 % nebulizer solution Commonly known as: ATROVENT Take 0.5 mg by nebulization See admin instructions. Use one vial by nebulization five times daily   ipratropium-albuterol 0.5-2.5 (3) MG/3ML Soln Commonly known as: DUONEB Take 3 mLs by nebulization 3 (three) times daily. And as needed for SOB   lactobacillus acidophilus & bulgar chewable tablet Chew 1 tablet by mouth 3 (three) times daily with  meals for 5 days.   methylPREDNISolone 4 MG Tbpk tablet Commonly known as: MEDROL DOSEPAK Medrol Dosepak take as instructed   omeprazole 20 MG capsule Commonly known as: PRILOSEC Take 20 mg by mouth daily.   OXYGEN Place 3 L into the nose continuous.   traZODone 50 MG tablet Commonly known as: DESYREL Take 50 mg by mouth at bedtime.        Discharge Assessment: Vitals:   02/10/22 1111 02/10/22 1200  BP:  (!) 127/97  Pulse: 86 (!) 131  Resp: (!) 25 (!) 22  Temp: 98.3 F (36.8 C)   SpO2: 96% (!) 86%   Skin clean, dry and intact without evidence of skin break down, no evidence of skin tears noted. IV catheter discontinued intact. Site without signs and symptoms of complications - no redness or edema noted at insertion site, patient denies c/o pain.  Dressing with slight pressure applied.  D/c Instructions-Education: Discharge instructions given to patient/family with verbalized understanding. D/c education completed with patient/family including follow up instructions, medication list, d/c activities limitations if indicated, with other d/c instructions as indicated by MD - patient able to verbalize understanding, all questions fully answered. Patient instructed to return to ED, call 911, or call MD for any changes in condition.  Patient escorted via [place], and D/C home via private auto.  Carney Corners, RN 02/10/2022 1:47 PM

## 2022-02-10 NOTE — TOC Transition Note (Signed)
Transition of Care [facility]) - CM/SW Discharge Note   Patient Details  Name: Donna Watkins MRN: [id number] Date of Birth: [birth year]  [facility]) CM/SW Contact:  Salome Arnt, LCSW Phone Number: 02/10/2022, 11:45 AM   Clinical Narrative:  Pt d/c today. NIV to be delivered to hospital room at 1:00. RN and MD updated. [place] notified of d/c. Requested MD put in HHPT order.      Final next level of care: [place] Barriers to Discharge: Barriers Resolved   Patient Goals and CMS Choice      Discharge Placement                      Patient and family notified of of transfer: 02/10/22  Discharge Plan and Services Additional resources added to the After Visit Summary for                  DME Arranged: NIV DME Agency: [employer] Date DME Agency Contacted: 02/09/22 Time DME Agency Contacted: [id number] Representative spoke with at DME Agency: [place]: [place] Date [place]: 02/10/22 Time [place]: [id number] Representative spoke with at [place]: [place] Determinants of Health ([place]) Interventions [place]: No Food Insecurity (02/05/2022)  Housing: Low Risk  (02/05/2022)  Transportation Needs: No Transportation Needs (02/05/2022)  Utilities: Not At Risk (02/05/2022)  Alcohol Screen: Low Risk  (07/30/2021)  Depression (PHQ2-9): Low Risk  (08/12/2020)  Financial Resource Strain: Low Risk  (07/30/2021)  Physical Activity: Sufficiently Active (07/30/2021)  Social Connections: Socially Isolated (07/30/2021)  Stress: No Stress Concern Present (07/30/2021)  Tobacco Use: Medium Risk (11/26/2021)     Readmission Risk Interventions    02/06/2022    2:58 PM  Readmission Risk Prevention Plan  Transportation Screening Complete  Home Care Screening Complete  Medication Review (RN CM) Complete

## 2022-03-18 ENCOUNTER — Ambulatory Visit: Payer: Medicare HMO | Admitting: Internal Medicine

## 2022-03-25 ENCOUNTER — Inpatient Hospital Stay (HOSPITAL_COMMUNITY)
Admission: EM | Admit: 2022-03-25 | Discharge: 2022-03-28 | DRG: 190 | Disposition: A | Discharge status: Home Health | Payer: Medicare HMO | Attending: Internal Medicine | Admitting: Internal Medicine

## 2022-03-25 ENCOUNTER — Emergency Department (HOSPITAL_COMMUNITY): Payer: Medicare HMO

## 2022-03-25 ENCOUNTER — Other Ambulatory Visit: Payer: Self-pay

## 2022-03-25 DIAGNOSIS — W19XXXA Unspecified fall, initial encounter: Secondary | ICD-10-CM | POA: Diagnosis not present

## 2022-03-25 DIAGNOSIS — R748 Abnormal levels of other serum enzymes: Secondary | ICD-10-CM

## 2022-03-25 DIAGNOSIS — L03317 Cellulitis of buttock: Secondary | ICD-10-CM

## 2022-03-25 DIAGNOSIS — R0602 Shortness of breath: Secondary | ICD-10-CM | POA: Diagnosis not present

## 2022-03-25 DIAGNOSIS — J9622 Acute and chronic respiratory failure with hypercapnia: Secondary | ICD-10-CM | POA: Diagnosis present

## 2022-03-25 DIAGNOSIS — D696 Thrombocytopenia, unspecified: Secondary | ICD-10-CM | POA: Diagnosis present

## 2022-03-25 DIAGNOSIS — R7989 Other specified abnormal findings of blood chemistry: Secondary | ICD-10-CM | POA: Insufficient documentation

## 2022-03-25 DIAGNOSIS — Z8249 Family history of ischemic heart disease and other diseases of the circulatory system: Secondary | ICD-10-CM

## 2022-03-25 DIAGNOSIS — J441 Chronic obstructive pulmonary disease with (acute) exacerbation: Secondary | ICD-10-CM | POA: Diagnosis not present

## 2022-03-25 DIAGNOSIS — E440 Moderate protein-calorie malnutrition: Secondary | ICD-10-CM | POA: Insufficient documentation

## 2022-03-25 DIAGNOSIS — I2489 Other forms of acute ischemic heart disease: Secondary | ICD-10-CM | POA: Diagnosis present

## 2022-03-25 DIAGNOSIS — R911 Solitary pulmonary nodule: Secondary | ICD-10-CM | POA: Diagnosis present

## 2022-03-25 DIAGNOSIS — Z9981 Dependence on supplemental oxygen: Secondary | ICD-10-CM

## 2022-03-25 DIAGNOSIS — Z8261 Family history of arthritis: Secondary | ICD-10-CM

## 2022-03-25 DIAGNOSIS — Y92009 Unspecified place in unspecified non-institutional (private) residence as the place of occurrence of the external cause: Secondary | ICD-10-CM

## 2022-03-25 DIAGNOSIS — J9621 Acute and chronic respiratory failure with hypoxia: Secondary | ICD-10-CM | POA: Diagnosis present

## 2022-03-25 DIAGNOSIS — Z1152 Encounter for screening for COVID-19: Secondary | ICD-10-CM

## 2022-03-25 DIAGNOSIS — K219 Gastro-esophageal reflux disease without esophagitis: Secondary | ICD-10-CM | POA: Diagnosis present

## 2022-03-25 DIAGNOSIS — M419 Scoliosis, unspecified: Secondary | ICD-10-CM | POA: Diagnosis present

## 2022-03-25 DIAGNOSIS — R636 Underweight: Secondary | ICD-10-CM | POA: Diagnosis present

## 2022-03-25 DIAGNOSIS — J449 Chronic obstructive pulmonary disease, unspecified: Secondary | ICD-10-CM | POA: Diagnosis present

## 2022-03-25 DIAGNOSIS — B1910 Unspecified viral hepatitis B without hepatic coma: Secondary | ICD-10-CM | POA: Diagnosis present

## 2022-03-25 DIAGNOSIS — F411 Generalized anxiety disorder: Secondary | ICD-10-CM | POA: Diagnosis present

## 2022-03-25 DIAGNOSIS — E782 Mixed hyperlipidemia: Secondary | ICD-10-CM | POA: Diagnosis present

## 2022-03-25 DIAGNOSIS — L89156 Pressure-induced deep tissue damage of sacral region: Secondary | ICD-10-CM

## 2022-03-25 DIAGNOSIS — Z825 Family history of asthma and other chronic lower respiratory diseases: Secondary | ICD-10-CM

## 2022-03-25 DIAGNOSIS — L89150 Pressure ulcer of sacral region, unstageable: Secondary | ICD-10-CM | POA: Diagnosis present

## 2022-03-25 DIAGNOSIS — K828 Other specified diseases of gallbladder: Secondary | ICD-10-CM | POA: Diagnosis present

## 2022-03-25 DIAGNOSIS — R000 Tachycardia, unspecified: Secondary | ICD-10-CM | POA: Diagnosis present

## 2022-03-25 DIAGNOSIS — L89159 Pressure ulcer of sacral region, unspecified stage: Secondary | ICD-10-CM

## 2022-03-25 DIAGNOSIS — B1920 Unspecified viral hepatitis C without hepatic coma: Secondary | ICD-10-CM | POA: Diagnosis present

## 2022-03-25 DIAGNOSIS — Z923 Personal history of irradiation: Secondary | ICD-10-CM

## 2022-03-25 DIAGNOSIS — F419 Anxiety disorder, unspecified: Secondary | ICD-10-CM | POA: Diagnosis present

## 2022-03-25 DIAGNOSIS — R627 Adult failure to thrive: Secondary | ICD-10-CM | POA: Diagnosis present

## 2022-03-25 DIAGNOSIS — Z79899 Other long term (current) drug therapy: Secondary | ICD-10-CM

## 2022-03-25 DIAGNOSIS — Z87891 Personal history of nicotine dependence: Secondary | ICD-10-CM

## 2022-03-25 DIAGNOSIS — L03818 Cellulitis of other sites: Secondary | ICD-10-CM | POA: Diagnosis present

## 2022-03-25 DIAGNOSIS — Z85118 Personal history of other malignant neoplasm of bronchus and lung: Secondary | ICD-10-CM

## 2022-03-25 DIAGNOSIS — Z681 Body mass index (BMI) 19 or less, adult: Secondary | ICD-10-CM

## 2022-03-25 LAB — CBC WITH DIFFERENTIAL/PLATELET
Abs Immature Granulocytes: 0.08 10*3/uL — ABNORMAL HIGH (ref 0.00–0.07)
Basophils Absolute: 0 10*3/uL (ref 0.0–0.1)
Basophils Relative: 0 %
Eosinophils Absolute: 0 10*3/uL (ref 0.0–0.5)
Eosinophils Relative: 0 %
HCT: 45.7 % (ref 36.0–46.0)
Hemoglobin: 12.9 g/dL (ref 12.0–15.0)
Immature Granulocytes: 1 %
Lymphocytes Relative: 2 %
Lymphs Abs: 0.3 10*3/uL — ABNORMAL LOW (ref 0.7–4.0)
MCH: 31.7 pg (ref 26.0–34.0)
MCHC: 28.2 g/dL — ABNORMAL LOW (ref 30.0–36.0)
MCV: 112.3 fL — ABNORMAL HIGH (ref 80.0–100.0)
Monocytes Absolute: 0.6 10*3/uL (ref 0.1–1.0)
Monocytes Relative: 5 %
Neutro Abs: 10.7 10*3/uL — ABNORMAL HIGH (ref 1.7–7.7)
Neutrophils Relative %: 92 %
Platelets: 141 10*3/uL — ABNORMAL LOW (ref 150–400)
RBC: 4.07 MIL/uL (ref 3.87–5.11)
RDW: 11.9 % (ref 11.5–15.5)
WBC: 11.6 10*3/uL — ABNORMAL HIGH (ref 4.0–10.5)
nRBC: 0 % (ref 0.0–0.2)

## 2022-03-25 LAB — HEPATITIS PANEL, ACUTE
HCV Ab: NONREACTIVE
Hep A IgM: NONREACTIVE
Hep B C IgM: NONREACTIVE
Hepatitis B Surface Ag: NONREACTIVE

## 2022-03-25 LAB — COMPREHENSIVE METABOLIC PANEL
ALT: 670 U/L — ABNORMAL HIGH (ref 0–44)
AST: 211 U/L — ABNORMAL HIGH (ref 15–41)
Albumin: 3.2 g/dL — ABNORMAL LOW (ref 3.5–5.0)
Alkaline Phosphatase: 97 U/L (ref 38–126)
BUN: 18 mg/dL (ref 6–20)
CO2: >45 mmol/L — ABNORMAL HIGH (ref 22–32)
Calcium: 9 mg/dL (ref 8.9–10.3)
Chloride: 79 mmol/L — ABNORMAL LOW (ref 98–111)
Creatinine, Ser: 0.44 mg/dL (ref 0.44–1.00)
GFR, Estimated: >60 mL/min (ref >60)
Glucose, Bld: 117 mg/dL — ABNORMAL HIGH (ref 70–99)
Potassium: 3.7 mmol/L (ref 3.5–5.1)
Sodium: 142 mmol/L (ref 135–145)
Total Bilirubin: 1 mg/dL (ref 0.3–1.2)
Total Protein: 6.5 g/dL (ref 6.5–8.1)

## 2022-03-25 LAB — BRAIN NATRIURETIC PEPTIDE: B Natriuretic Peptide: 77 pg/mL (ref 0.0–100.0)

## 2022-03-25 LAB — ACETAMINOPHEN LEVEL: Acetaminophen (Tylenol), Serum: <10 ug/mL — ABNORMAL LOW (ref 10–30)

## 2022-03-25 LAB — BLOOD GAS, VENOUS
Acid-Base Excess: 37.8 mmol/L — ABNORMAL HIGH (ref 0.0–2.0)
Bicarbonate: 73.7 mmol/L — ABNORMAL HIGH (ref 20.0–28.0)
Drawn by: 66297
O2 Saturation: 36.3 %
Patient temperature: 36.7
pCO2, Ven: >123 mmHg (ref 44–60)
pH, Ven: 7.32 (ref 7.25–7.43)
pO2, Ven: <31 mmHg — CL (ref 32–45)

## 2022-03-25 LAB — TROPONIN I (HIGH SENSITIVITY)
Troponin I (High Sensitivity): 77 ng/L — ABNORMAL HIGH (ref <18)
Troponin I (High Sensitivity): 73 ng/L — ABNORMAL HIGH (ref <18)

## 2022-03-25 LAB — D-DIMER, QUANTITATIVE: D-Dimer, Quant: 7.32 ug/mL-FEU — ABNORMAL HIGH (ref 0.00–0.50)

## 2022-03-25 LAB — RESP PANEL BY RT-PCR (RSV, FLU A&B, COVID)  RVPGX2
Influenza A by PCR: NEGATIVE
Influenza B by PCR: NEGATIVE
Resp Syncytial Virus by PCR: NEGATIVE
SARS Coronavirus 2 by RT PCR: NEGATIVE

## 2022-03-25 LAB — LACTIC ACID, PLASMA: Lactic Acid, Venous: 1.3 mmol/L (ref 0.5–1.9)

## 2022-03-25 LAB — MAGNESIUM: Magnesium: 2 mg/dL (ref 1.7–2.4)

## 2022-03-25 MED ORDER — VANCOMYCIN HCL IN DEXTROSE 1-5 GM/200ML-% IV SOLN
1000.0000 mg | Freq: Once | INTRAVENOUS | Status: AC
  Administered 2022-03-25: 1000 mg via INTRAVENOUS
  Filled 2022-03-25: qty 200

## 2022-03-25 MED ORDER — LACTATED RINGERS IV SOLN: INTRAVENOUS | Status: AC

## 2022-03-25 MED ORDER — FENTANYL CITRATE PF 50 MCG/ML IJ SOSY
50.0000 ug | PREFILLED_SYRINGE | Freq: Once | INTRAMUSCULAR | Status: AC
  Administered 2022-03-25: 50 ug via INTRAVENOUS
  Filled 2022-03-25: qty 1

## 2022-03-25 MED ORDER — IPRATROPIUM-ALBUTEROL 0.5-2.5 (3) MG/3ML IN SOLN
3.0000 mL | Freq: Once | RESPIRATORY_TRACT | Status: AC
  Administered 2022-03-25: 3 mL via RESPIRATORY_TRACT
  Filled 2022-03-25: qty 3

## 2022-03-25 MED ORDER — VANCOMYCIN HCL 750 MG/150ML IV SOLN
750.0000 mg | INTRAVENOUS | Status: DC
  Administered 2022-03-26 – 2022-03-27 (×2): 750 mg via INTRAVENOUS
  Filled 2022-03-25 (×3): qty 150

## 2022-03-25 MED ORDER — METHYLPREDNISOLONE SODIUM SUCC 125 MG IJ SOLR
125.0000 mg | Freq: Once | INTRAMUSCULAR | Status: AC
  Administered 2022-03-25: 125 mg via INTRAVENOUS
  Filled 2022-03-25: qty 2

## 2022-03-25 MED ORDER — SODIUM CHLORIDE 0.9 % IV SOLN: 2.0000 g | Freq: Once | INTRAVENOUS | Status: DC

## 2022-03-25 MED ORDER — SODIUM CHLORIDE 0.9 % IV SOLN
1.0000 g | Freq: Once | INTRAVENOUS | Status: AC
  Administered 2022-03-25: 1 g via INTRAVENOUS
  Filled 2022-03-25: qty 10

## 2022-03-25 MED ORDER — IOHEXOL 350 MG/ML SOLN
100.0000 mL | Freq: Once | INTRAVENOUS | Status: AC | PRN
  Administered 2022-03-25: 100 mL via INTRAVENOUS

## 2022-03-25 MED ORDER — SODIUM CHLORIDE 0.9 % IV BOLUS (SEPSIS)
1000.0000 mL | Freq: Once | INTRAVENOUS | Status: AC
  Administered 2022-03-25: 1000 mL via INTRAVENOUS

## 2022-03-25 NOTE — ED Notes (Signed)
Removed dsg to pt's sacrum with Dr Josephine Cables at bedside. Dsg was saturated and foul smelling. New Mepilex sacral dsg applied.

## 2022-03-25 NOTE — H&P (Signed)
History and Physical    Patient: Donna Watkins [id number] DOB: Dec 31, 1960 DOA: 03/25/2022 DOS: the patient was seen and examined on 03/26/2022 PCP: Lemmie Evens, MD  Patient coming from: Home  Chief Complaint:  Chief Complaint  Patient presents with   Shortness of Breath   Fall   HPI: Donna Watkins is a 62 y.o. female with medical history significant of lung cancer, COPD, chronic respiratory failure with hypoxia on 3 LPM of oxygen, GERD, hyperlipidemia, depression and anxiety who presents to the emergency department via EMS due to shortness of breath.  Patient was on BiPAP and was unable to express herself effectively, so history was obtained from daughter at bedside and from ED physician.  Per report, she had a mechanical fall yesterday without any noticeable injury, she had a worsening shortness of breath today, physical therapist followed up on him today and noted O2 sat to be 85% when ambulating, so her supplemental oxygen was increased to 4 L, EMS was activated and patient did receive an albuterol breathing treatment prior to arrival to the ED.  ED Course:  In the emergency department, she was tachypneic and tachycardic, BP was 113/76, other vital signs were within normal range.  O2 sat was 100% on BiPAP at FiO2 40%.  Workup in the ED showed CBC with WBC of 11.6, MCV 112.3, platelets 141, hemoglobin 12.9, hematocrit 45.7.  D-dimer 0.32, troponin 77 . 73, BNP 77.0, acetaminophen level less than 10, lactic acid 1.3, magnesium 2.0.  Influenza A, B, SARS coronavirus 2, RSV was negative. CT abdomen and pelvis with contrast showed possibly gallbladder which is unchanged since prior study.  Large stool burden in the colon.  Scattered aortic atherosclerosis. Soft tissue gas in the soft tissues overlying the sacrum most compatible with sacral decubitus ulcer. No underlying changes of osteomyelitis CT angiography chest with contrast showed No evidence of pulmonary embolus. 1.9 x 1.4 cm  bilobed right lower lobe pulmonary nodule, increasing in size since prior study. This is concerning for malignancy. Patient was treated with IV ceftriaxone and vancomycin, IV hydration was provided, Solu-Medrol and DuoNeb were given fentanyl was given due to pain. Hospitalist was asked to admit patient for further evaluation and management.    Review of Systems: Review of systems as noted in the HPI. All other systems reviewed and are negative.   Past Medical History:  Diagnosis Date   Anxiety    panic attacks   Cancer (HCC)    COPD (chronic obstructive pulmonary disease) ([place]) 03/01/2012   Depression    GERD (gastroesophageal reflux disease)    History of radiation therapy    Left Lung- 12/09/20-12/19/20- Dr. Gery Pray   Hyperlipidemia    Mild scoliosis    Oxygen dependent    Past Surgical History:  Procedure Laterality Date   BRONCHIAL BIOPSY  11/04/2020   Procedure: BRONCHIAL BIOPSIES;  Surgeon: Collene Gobble, MD;  Location: [place];  Service: Pulmonary;;   BRONCHIAL BRUSHINGS  11/04/2020   Procedure: BRONCHIAL BRUSHINGS;  Surgeon: Collene Gobble, MD;  Location: [place];  Service: Pulmonary;;   BRONCHIAL NEEDLE ASPIRATION BIOPSY  11/04/2020   Procedure: BRONCHIAL NEEDLE ASPIRATION BIOPSIES;  Surgeon: Collene Gobble, MD;  Location: [place] [facility];  Service: Pulmonary;;   COLONOSCOPY N/A 09/09/2012   Procedure: COLONOSCOPY;  Surgeon: Danie Binder, MD;  [facility];  Service: Endoscopy;  Laterality: N/A;  1:15-moved to 12:45 Melanie notified pt   FIDUCIAL MARKER PLACEMENT  11/04/2020   Procedure: FIDUCIAL  MARKER PLACEMENT;  Surgeon: Collene Gobble, MD;  Location: [facility];  Service: Pulmonary;;   TUBAL LIGATION     VIDEO BRONCHOSCOPY WITH ENDOBRONCHIAL NAVIGATION N/A 11/04/2020   Procedure: ROBOTIC  VIDEO BRONCHOSCOPY WITH ENDOBRONCHIAL NAVIGATION;  Surgeon: Collene Gobble, MD;  Location: [place] [facility];  Service: Pulmonary;  Laterality: N/A;     Social History:  reports that she quit smoking about 10 years ago. Her smoking use included cigarettes. She has a 75.00 pack-year smoking history. She has never used smokeless tobacco. She reports that she does not drink alcohol and does not use drugs.   No Known Allergies  Family History  Problem Relation Age of Onset   Heart disease Father    COPD Mother    Arthritis Sister    Colon cancer Neg Hx      Prior to Admission medications   Medication Sig Start Date End Date Taking? Authorizing Provider  albuterol (VENTOLIN HFA) 108 (90 Base) MCG/ACT inhaler Inhale 2 puffs into the lungs every 4 (four) hours as needed for wheezing or shortness of breath. 11/27/21  Yes Johnson, Clanford L, MD  ALPRAZolam (XANAX) 1 MG tablet Take 1 tablet (1 mg total) by mouth 3 (three) times daily as needed for anxiety. 11/27/21  Yes Johnson, Clanford L, MD  atorvastatin (LIPITOR) 20 MG tablet Take 20 mg by mouth daily. 05/06/20  Yes [provider]  ibuprofen (ADVIL) 200 MG tablet Take 200 mg by mouth every 6 (six) hours as needed for mild pain.   Yes [provider]  ipratropium-albuterol (DUONEB) 0.5-2.5 (3) MG/3ML SOLN Take 3 mLs by nebulization 3 (three) times daily. And as needed for SOB 11/27/21  Yes Johnson, Clanford L, MD  Multiple Vitamins-Minerals (CENTRUM SILVER PO) Take 1 tablet by mouth daily.   Yes [provider]  omeprazole (PRILOSEC) 20 MG capsule Take 20 mg by mouth daily.   Yes [provider]  OXYGEN Place 3 L into the nose continuous.   Yes [provider]  albuterol (PROVENTIL) (2.5 MG/3ML) 0.083% nebulizer solution Take 2.5 mg by nebulization See admin instructions. 2.5 mg by nebulization five times daily 01/23/22   [provider]  ipratropium (ATROVENT) 0.02 % nebulizer solution Take 0.5 mg by nebulization See admin instructions. Use one vial by nebulization five times daily Patient not taking: Reported on 03/25/2022 01/23/22    [provider]  methylPREDNISolone (MEDROL DOSEPAK) 4 MG TBPK tablet Medrol Dosepak take as instructed Patient not taking: Reported on 03/25/2022 02/10/22   Deatra James, MD  traZODone (DESYREL) 50 MG tablet Take 50 mg by mouth at bedtime. Patient not taking: Reported on 03/25/2022 06/25/17   [provider]    Physical Exam: BP 104/64   Pulse 86   Temp 97.9 F (36.6 C) (Axillary)   Resp (!) 27   Ht 5\' 3"  (1.6 m)   Wt 45.4 kg   LMP 05/06/2010   SpO2 95%   BMI 17.71 kg/m   General: 62 y.o. year-old female cachectic, ill-appearing, on BiPAP but in no acute distress.  Alert and oriented x3. HEENT: NCAT, EOMI Neck: Supple, trachea medial Cardiovascular: Regular rate and rhythm with no rubs or gallops.  No thyromegaly or JVD noted.  No lower extremity edema. 2/4 pulses in all 4 extremities. Respiratory: Clear to auscultation with no wheezes or rales. Good inspiratory effort. Abdomen: Soft, nontender nondistended with normal bowel sounds x4 quadrants. Muskuloskeletal: No cyanosis, clubbing or edema noted bilaterally Neuro: CN II-XII intact, strength  5/5 x 4, sensation, reflexes intact Skin: Sacral decubitus ulcer with foul odor with surrounding erythema  Psychiatry: Mood is appropriate for condition and setting            Labs on Admission:  Basic Metabolic Panel: Recent Labs  Lab 03/25/22 1548  NA 142  K 3.7  CL 79*  CO2 >45*  GLUCOSE 117*  BUN 18  CREATININE 0.44  CALCIUM 9.0  MG 2.0   Liver Function Tests: Recent Labs  Lab 03/25/22 1548  AST 211*  ALT 670*  ALKPHOS 97  BILITOT 1.0  PROT 6.5  ALBUMIN 3.2*   No results for input(s): "LIPASE", "AMYLASE" in the last 168 hours. No results for input(s): "AMMONIA" in the last 168 hours. CBC: Recent Labs  Lab 03/25/22 1548  WBC 11.6*  NEUTROABS 10.7*  HGB 12.9  HCT 45.7  MCV 112.3*  PLT 141*   Cardiac Enzymes: No results for input(s): "CKTOTAL", "CKMB", "CKMBINDEX", "TROPONINI" in  the last 168 hours.  BNP (last 3 results) Recent Labs    02/06/22 0435 02/09/22 0914 03/25/22 1548  BNP 69.0 31.0 77.0    ProBNP (last 3 results) No results for input(s): "PROBNP" in the last 8760 hours.  CBG: No results for input(s): "GLUCAP" in the last 168 hours.  Radiological Exams on Admission: CT ABDOMEN PELVIS W CONTRAST  Result Date: 03/25/2022 CLINICAL DATA:  Fall, abdominal pain. EXAM: CT ABDOMEN AND PELVIS WITH CONTRAST TECHNIQUE: Multidetector CT imaging of the abdomen and pelvis was performed using the standard protocol following bolus administration of intravenous contrast. RADIATION DOSE REDUCTION: This exam was performed according to the departmental dose-optimization program which includes automated exposure control, adjustment of the mA and/or kV according to patient size and/or use of iterative reconstruction technique. CONTRAST:  142mL OMNIPAQUE IOHEXOL 350 MG/ML SOLN COMPARISON:  06/25/2017 FINDINGS: Lower chest: See CTA chest report today Hepatobiliary: Gallbladder wall calcifications compatible with a porcelain gallbladder. This is unchanged since prior study. No visible stones. Small hypodensity in the right hepatic lobe is most compatible with small cyst. Pancreas: No focal abnormality or ductal dilatation. Spleen: No focal abnormality.  Normal size. Adrenals/Urinary Tract: No adrenal abnormality. No focal renal abnormality. No stones or hydronephrosis. Urinary bladder is unremarkable. Stomach/Bowel: Large stool burden throughout the colon. Stomach, large and small bowel grossly unremarkable. Vascular/Lymphatic: Scattered aortic calcifications. No evidence of aneurysm or adenopathy. Reproductive: Uterus and adnexa unremarkable.  No mass. Other: No free fluid or free air. Musculoskeletal: There appears to be a sacral decubitus ulcer with gas in the soft tissues overlying the sacrum extending to the skin surface. No bony changes of osteomyelitis. No acute bony abnormality.  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 Large stool burden in the colon. Scattered aortic atherosclerosis. Soft tissue gas in the soft tissues overlying the sacrum most compatible with sacral decubitus ulcer. No underlying changes of osteomyelitis. Electronically Signed   By: Rolm Baptise M.D.   On: 03/25/2022 19:14   CT Angio Chest PE W and/or Wo Contrast  Result Date: 03/25/2022 CLINICAL DATA:  Pulmonary embolism (PE) suspected, low to intermediate prob, positive D-dimer Pulmonary embolism (PE) suspected, high prob. Fall. Shortness of breath. EXAM: CT ANGIOGRAPHY CHEST WITH CONTRAST TECHNIQUE: Multidetector CT imaging of the chest was performed using the standard protocol during bolus administration of intravenous contrast. Multiplanar CT image reconstructions and MIPs were obtained to evaluate the vascular anatomy. RADIATION DOSE REDUCTION: This exam was performed according to the departmental dose-optimization program which includes  automated exposure control, adjustment of the mA and/or kV according to patient size and/or use of iterative reconstruction technique. CONTRAST:  15mL OMNIPAQUE IOHEXOL 350 MG/ML SOLN COMPARISON:  01/12/2022.  PET CT 02/05/2022. FINDINGS: Cardiovascular: No filling defects in the pulmonary arteries to suggest pulmonary emboli. Heart is normal size. Aorta is normal caliber. Scattered aortic calcifications. Mediastinum/Nodes: No mediastinal, hilar, or axillary adenopathy. Trachea and esophagus are unremarkable. Thyroid unremarkable. Lungs/Pleura: Moderate to advanced emphysema. Bilobed right lower lobe pulmonary nodule measures 1.9 x 1.4 cm. This is increased in size, previously measuring 1.4 x 1.3 cm. This is concerning for malignancy. Areas of scarring in the left lower lobe. No effusions. Upper Abdomen: No acute findings Musculoskeletal: Chest wall soft tissues are unremarkable. No acute bony abnormality. Review of the MIP images confirms the  above findings. IMPRESSION: No evidence of pulmonary embolus. 1.9 x 1.4 cm bilobed right lower lobe pulmonary nodule, increasing in size since prior study. This is concerning for malignancy. This has been previously evaluated with PET CT. Aortic Atherosclerosis (ICD10-I70.0) and Emphysema (ICD10-J43.9). Electronically Signed   By: Rolm Baptise M.D.   On: 03/25/2022 19:11   DG Chest Port 1 View  Result Date: 03/25/2022 CLINICAL DATA:  Shortness of breath EXAM: PORTABLE CHEST 1 VIEW COMPARISON:  02/09/2022 FINDINGS: Unchanged cardiac and mediastinal contours. Redemonstrated hyperinflation, compatible with COPD. Redemonstrated right lower lung nodule, better evaluated on the 11/26/2021 CTA and 02/05/2022 PET-CT. No new focal pulmonary opacity. No pleural effusion or pneumothorax. No acute osseous abnormality. IMPRESSION: 1. No acute cardiopulmonary process. 2. Redemonstrated right lower lung nodule, better evaluated on the 11/26/2021 CTA and 02/05/2022 PET-CT. Electronically Signed   By: Merilyn Baba M.D.   On: 03/25/2022 16:20    EKG: I independently viewed the EKG done and my findings are as followed: Sinus tachycardia at a rate of 116 bpm  Assessment/Plan Present on Admission:  Acute on chronic respiratory failure with hypoxia (HCC)  Nodule of lower lobe of right lung  Failure to thrive in adult  Underweight  GERD (gastroesophageal reflux disease)  Anxiety  Mixed hyperlipidemia  Principal Problem:   Acute on chronic respiratory failure with hypoxia (HCC) Active Problems:   Nodule of lower lobe of right lung   GERD (gastroesophageal reflux disease)   Mixed hyperlipidemia   Underweight   Failure to thrive in adult   Anxiety   Sacral decubitus ulcer   Elevated troponin   Elevated d-dimer  Acute on chronic respiratory failure with hypoxia End-stage COPD This is possibly secondary to patient's COPD, though patient was not in acute exacerbation Continue BiPAP with plan to transition  patient to supplemental oxygen as tolerated Continue home meds  Sacral decubitus ulcer with suspicion of superimposed cellulitis Continue vancomycin Wound nurse will be consulted and we shall await further recommendations  Fall at home Continue fall precaution Continue PT/OT eval and treat  Elevated troponin possibly secondary to type II demand ischemia Troponin already flattened - 77 > 73  Right lower lobe nodule 1.9 x 1.4 cm bilobed right lower lobe pulmonary nodule, increasing in size since prior study with concern for malignancy.  Elevated D-dimer secondary to multifactorial D-dimer 7.32 CT angiography of chest ruled out pulmonary embolus Continue to monitor patient.  Failure to thrive in adult Underweight (BMI 17.71) Protein supplement to be provided Dietitian will be consulted and we shall await further recommendations  Mixed hyperlipidemia Continue Lipitor  GERD Continue Protonix  Anxiety Continue home Xanax as needed   DVT prophylaxis: Lovenox  Code Status: Full code  Family Communication: Daughter and grandson at bedside (all questions answered to satisfaction)  Consults: None  Severity of Illness: The appropriate patient status for this patient is INPATIENT. Inpatient status is judged to be reasonable and necessary in order to provide the required intensity of service to ensure the patient's safety. The patient's presenting symptoms, physical exam findings, and initial radiographic and laboratory data in the context of their chronic comorbidities is felt to place them at high risk for further clinical deterioration. Furthermore, it is not anticipated that the patient will be medically stable for discharge from the hospital within 2 midnights of admission.   * I certify that at the point of admission it is my clinical judgment that the patient will require inpatient hospital care spanning beyond 2 midnights from the point of admission due to high intensity of  service, high risk for further deterioration and high frequency of surveillance required.*  Author: Bernadette Hoit, DO 03/26/2022 5:43 AM  For on call review [email or web address].

## 2022-03-25 NOTE — ED Provider Notes (Signed)
[facility] Provider Note   CSN: [id number] Arrival date & time: 03/25/22  1507     History {Add pertinent medical, surgical, social history, OB history to HPI:1} No chief complaint on file.   Donna Watkins is a 62 y.o. female.  HPI Patient presents for shortness of breath and tachycardia.  Medical history includes COPD, chronic hypoxia, lung cancer, GERD, depression, anemia, malnutrition.  She arrives from home via EMS.  Shortness of breath tachycardia was noticed by her physical therapist today and physical therapist called EMS.  Patient is not sure if she has had increased shortness of breath lately.  Currently, she states that her breathing feels baseline to her.  She is on 3 L of supplemental oxygen at all times.  EMS did note SpO2 in the high 80s on 3 L and did increase her dose to 4 L.  Patient did have a fall last night but denies any areas of pain since then.  When EMS arrived on scene, she was getting an albuterol breathing treatment.    Home Medications Prior to Admission medications   Medication Sig Start Date End Date Taking? Authorizing Provider  albuterol (PROVENTIL) (2.5 MG/3ML) 0.083% nebulizer solution Take 2.5 mg by nebulization See admin instructions. 2.5 mg by nebulization five times daily 01/23/22   [provider]  albuterol (VENTOLIN HFA) 108 (90 Base) MCG/ACT inhaler Inhale 2 puffs into the lungs every 4 (four) hours as needed for wheezing or shortness of breath. 11/27/21   Johnson, Clanford L, MD  ALPRAZolam Duanne Moron) 1 MG tablet Take 1 tablet (1 mg total) by mouth 3 (three) times daily as needed for anxiety. 11/27/21   Johnson, Clanford L, MD  atorvastatin (LIPITOR) 20 MG tablet Take 20 mg by mouth daily. 05/06/20   [provider]  ibuprofen (ADVIL) 200 MG tablet Take 200 mg by mouth every 6 (six) hours as needed for mild pain.    [provider]  ipratropium (ATROVENT) 0.02 % nebulizer  solution Take 0.5 mg by nebulization See admin instructions. Use one vial by nebulization five times daily 01/23/22   [provider]  ipratropium-albuterol (DUONEB) 0.5-2.5 (3) MG/3ML SOLN Take 3 mLs by nebulization 3 (three) times daily. And as needed for SOB 11/27/21   Irwin Brakeman L, MD  methylPREDNISolone (MEDROL DOSEPAK) 4 MG TBPK tablet Medrol Dosepak take as instructed 02/10/22   Shahmehdi, Valeria Batman, MD  Multiple Vitamins-Minerals (CENTRUM SILVER PO) Take 1 tablet by mouth daily.    [provider]  omeprazole (PRILOSEC) 20 MG capsule Take 20 mg by mouth daily.    [provider]  OXYGEN Place 3 L into the nose continuous.    [provider]  traZODone (DESYREL) 50 MG tablet Take 50 mg by mouth at bedtime. 06/25/17   [provider]      Allergies    Patient has no known allergies.    Review of Systems   Review of Systems  Respiratory:  Positive for shortness of breath.   All other systems reviewed and are negative.   Physical Exam Updated Vital Signs LMP 05/06/2010  Physical Exam Vitals and nursing note reviewed.  Constitutional:      General: She is not in acute distress.    Appearance: Normal appearance. She is well-developed. She is ill-appearing (Chronically). She is not toxic-appearing or diaphoretic.  HENT:     Head: Normocephalic and atraumatic.     Right Ear: External ear normal.  Left Ear: External ear normal.     Nose: Nose normal.     Mouth/Throat:     Mouth: Mucous membranes are moist.  Eyes:     Extraocular Movements: Extraocular movements intact.     Conjunctiva/sclera: Conjunctivae normal.  Cardiovascular:     Rate and Rhythm: Normal rate and regular rhythm.     Heart sounds: No murmur heard. Pulmonary:     Effort: Pulmonary effort is normal. No respiratory distress.     Breath sounds: Normal breath sounds. No wheezing or rales.  Chest:     Chest wall: No tenderness.  Abdominal:     General: There is  no distension.     Palpations: Abdomen is soft.     Tenderness: There is no abdominal tenderness.  Musculoskeletal:        General: No swelling. Normal range of motion.     Cervical back: Normal range of motion and neck supple.     Right lower leg: No edema.     Left lower leg: No edema.  Skin:    General: Skin is warm and dry.     Coloration: Skin is not jaundiced or pale.  Neurological:     General: No focal deficit present.     Mental Status: She is alert and oriented to person, place, and time.     Cranial Nerves: No cranial nerve deficit.     Sensory: No sensory deficit.     Motor: No weakness.     Coordination: Coordination normal.  Psychiatric:        Mood and Affect: Mood normal.        Behavior: Behavior normal.        Thought Content: Thought content normal.        Judgment: Judgment normal.     ED Results / Procedures / Treatments   Labs (all labs ordered are listed, but only abnormal results are displayed) Labs Reviewed - No data to display  EKG None  Radiology No results found.  Procedures Procedures  {Document cardiac monitor, telemetry assessment procedure when appropriate:1}  Medications Ordered in ED Medications - No data to display  ED Course/ Medical Decision Making/ A&P   {   Click here for ABCD2, HEART and other calculatorsREFRESH Note before signing :1}                          Medical Decision Making  This patient presents to the ED for concern of ***, this involves an extensive number of treatment options, and is a complaint that carries with it a high risk of complications and morbidity.  The differential diagnosis includes ***   Co morbidities that complicate the patient evaluation  ***   Additional history obtained:  Additional history obtained from *** External records from outside source obtained and reviewed including ***   Lab Tests:  I Ordered, and personally interpreted labs.  The pertinent results include:   ***   Imaging Studies ordered:  I ordered imaging studies including ***  I independently visualized and interpreted imaging which showed *** I agree with the radiologist interpretation   Cardiac Monitoring: / EKG:  The patient was maintained on a cardiac monitor.  I personally viewed and interpreted the cardiac monitored which showed an underlying rhythm of: ***   Consultations Obtained:  I requested consultation with the ***,  and discussed lab and imaging findings as well as pertinent plan - they recommend: ***  Problem List / ED Course / Critical interventions / Medication management  Patient with history of COPD and lung cancer presents for shortness of breath and tachycardia.  She is not on a blood thinner.  She is prescribed Xanax.  Heart rate with EMS was 120.  Vital signs were otherwise notable for mild hypoxia on her home dose of 3 L.  Supplemental oxygen was increased to 4 L.  On arrival, patient denies any current complaints.  She states that she is short of breath but that this is baseline for her.  No wheezing is appreciated on lung auscultation.  Diagnostic workup was initiated.***. I ordered medication including ***  for ***  Reevaluation of the patient after these medicines showed that the patient {resolved/improved/worsened:23923::"improved"} I have reviewed the patients home medicines and have made adjustments as needed   Social Determinants of Health:  ***   Test / Admission - Considered:  ***   {Document critical care time when appropriate:1} {Document review of labs and clinical decision tools ie heart score, Chads2Vasc2 etc:1}  {Document your independent review of radiology images, and any outside records:1} {Document your discussion with family members, caretakers, and with consultants:1} {Document social determinants of health affecting pt's care:1} {Document your decision making why or why not admission, treatments were needed:1} Final Clinical  Impression(s) / ED Diagnoses Final diagnoses:  None    Rx / DC Orders ED Discharge Orders     None

## 2022-03-25 NOTE — Progress Notes (Signed)
Pharmacy Antibiotic Note  Donna Watkins is a 62 y.o. female admitted on 03/25/2022 with cellulitis.  Pharmacy has been consulted for Vancomycin dosing.  Vancomycin 1gm IV loading dose ordered per MD.  Agree with dose. Ceftriaxone 1gm IV also given x 1 in ED.  Plan: Vancomycin 750 mg IV Q 24 hrs to begin 3/20 pm. Goal AUC 400-550. Expected AUC: 469 using TBW (less than IBW) SCr used: 0.8 (actual 0.44) Current plan is for 7 days of therapy, per dosing request. Will follow renal function, culture data, clinical progress and antibiotic plans.   Height: 5\' 3"  (160 cm) Weight: 45.4 kg (100 lb) IBW/kg (Calculated) : 52.4  Temp (24hrs), Avg:98.8 F (37.1 C), Min:98.8 F (37.1 C), Max:98.8 F (37.1 C)  Recent Labs  Lab 03/25/22 1548  WBC 11.6*  CREATININE 0.44    Estimated Creatinine Clearance: 53.6 mL/min (by C-G formula based on SCr of 0.44 mg/dL).    No Known Allergies  Antimicrobials this admission: Vancomycin 3/20>> (3/26) Ceftriaxone x 1 on 3/20  Dose adjustments this admission: n/a  Microbiology results: 3/20 Blood: sent 3/20 COVID, Flu and RSV: negative  Thank you for allowing pharmacy to be a part of this patient's care.  Arty Baumgartner, [place] 03/25/2022 8:12 PM

## 2022-03-25 NOTE — ED Triage Notes (Signed)
Pt had mechanical fall yesterday with  no injury. Today pt was SOB with PT. PT Is on home o2 3lnc.PT stated that pt desats to 85% when ambulating. Pt states that she is always sob and best O2 SAT on 3lnc is 90%. Denies pain or discomfort

## 2022-03-26 ENCOUNTER — Encounter (HOSPITAL_COMMUNITY): Payer: Self-pay | Admitting: Internal Medicine

## 2022-03-26 ENCOUNTER — Observation Stay (HOSPITAL_COMMUNITY): Payer: Medicare HMO

## 2022-03-26 DIAGNOSIS — J441 Chronic obstructive pulmonary disease with (acute) exacerbation: Secondary | ICD-10-CM | POA: Diagnosis present

## 2022-03-26 DIAGNOSIS — R748 Abnormal levels of other serum enzymes: Secondary | ICD-10-CM

## 2022-03-26 DIAGNOSIS — I2489 Other forms of acute ischemic heart disease: Secondary | ICD-10-CM | POA: Diagnosis present

## 2022-03-26 DIAGNOSIS — J9622 Acute and chronic respiratory failure with hypercapnia: Secondary | ICD-10-CM

## 2022-03-26 DIAGNOSIS — Z9981 Dependence on supplemental oxygen: Secondary | ICD-10-CM | POA: Diagnosis not present

## 2022-03-26 DIAGNOSIS — R000 Tachycardia, unspecified: Secondary | ICD-10-CM | POA: Diagnosis present

## 2022-03-26 DIAGNOSIS — L89159 Pressure ulcer of sacral region, unspecified stage: Secondary | ICD-10-CM | POA: Diagnosis not present

## 2022-03-26 DIAGNOSIS — Z1152 Encounter for screening for COVID-19: Secondary | ICD-10-CM | POA: Diagnosis not present

## 2022-03-26 DIAGNOSIS — D696 Thrombocytopenia, unspecified: Secondary | ICD-10-CM | POA: Diagnosis present

## 2022-03-26 DIAGNOSIS — F419 Anxiety disorder, unspecified: Secondary | ICD-10-CM | POA: Diagnosis present

## 2022-03-26 DIAGNOSIS — Z681 Body mass index (BMI) 19 or less, adult: Secondary | ICD-10-CM | POA: Diagnosis not present

## 2022-03-26 DIAGNOSIS — B1910 Unspecified viral hepatitis B without hepatic coma: Secondary | ICD-10-CM | POA: Diagnosis present

## 2022-03-26 DIAGNOSIS — J449 Chronic obstructive pulmonary disease, unspecified: Secondary | ICD-10-CM

## 2022-03-26 DIAGNOSIS — E440 Moderate protein-calorie malnutrition: Secondary | ICD-10-CM | POA: Diagnosis present

## 2022-03-26 DIAGNOSIS — L89153 Pressure ulcer of sacral region, stage 3: Secondary | ICD-10-CM | POA: Diagnosis not present

## 2022-03-26 DIAGNOSIS — K219 Gastro-esophageal reflux disease without esophagitis: Secondary | ICD-10-CM | POA: Diagnosis present

## 2022-03-26 DIAGNOSIS — L89150 Pressure ulcer of sacral region, unstageable: Secondary | ICD-10-CM | POA: Diagnosis present

## 2022-03-26 DIAGNOSIS — R0602 Shortness of breath: Secondary | ICD-10-CM | POA: Diagnosis present

## 2022-03-26 DIAGNOSIS — Z85118 Personal history of other malignant neoplasm of bronchus and lung: Secondary | ICD-10-CM | POA: Diagnosis not present

## 2022-03-26 DIAGNOSIS — E782 Mixed hyperlipidemia: Secondary | ICD-10-CM | POA: Diagnosis present

## 2022-03-26 DIAGNOSIS — Z87891 Personal history of nicotine dependence: Secondary | ICD-10-CM | POA: Diagnosis not present

## 2022-03-26 DIAGNOSIS — L03818 Cellulitis of other sites: Secondary | ICD-10-CM | POA: Diagnosis present

## 2022-03-26 DIAGNOSIS — Z825 Family history of asthma and other chronic lower respiratory diseases: Secondary | ICD-10-CM | POA: Diagnosis not present

## 2022-03-26 DIAGNOSIS — R7989 Other specified abnormal findings of blood chemistry: Secondary | ICD-10-CM | POA: Insufficient documentation

## 2022-03-26 DIAGNOSIS — Z923 Personal history of irradiation: Secondary | ICD-10-CM | POA: Diagnosis not present

## 2022-03-26 DIAGNOSIS — J9621 Acute and chronic respiratory failure with hypoxia: Secondary | ICD-10-CM | POA: Diagnosis present

## 2022-03-26 DIAGNOSIS — R627 Adult failure to thrive: Secondary | ICD-10-CM | POA: Diagnosis present

## 2022-03-26 DIAGNOSIS — Z8249 Family history of ischemic heart disease and other diseases of the circulatory system: Secondary | ICD-10-CM | POA: Diagnosis not present

## 2022-03-26 LAB — URINALYSIS, ROUTINE W REFLEX MICROSCOPIC
Bilirubin Urine: NEGATIVE
Glucose, UA: NEGATIVE mg/dL
Hgb urine dipstick: NEGATIVE
Ketones, ur: 5 mg/dL — AB
Leukocytes,Ua: NEGATIVE
Nitrite: NEGATIVE
Protein, ur: 30 mg/dL — AB
Specific Gravity, Urine: >1.046 — ABNORMAL HIGH (ref 1.005–1.030)
pH: 5 (ref 5.0–8.0)

## 2022-03-26 LAB — PHOSPHORUS: Phosphorus: 2.9 mg/dL (ref 2.5–4.6)

## 2022-03-26 LAB — MAGNESIUM: Magnesium: 1.6 mg/dL — ABNORMAL LOW (ref 1.7–2.4)

## 2022-03-26 MED ORDER — ENSURE ENLIVE PO LIQD
237.0000 mL | Freq: Two times a day (BID) | ORAL | Status: DC
  Administered 2022-03-26 – 2022-03-28 (×4): 237 mL via ORAL
  Filled 2022-03-26 (×6): qty 237

## 2022-03-26 MED ORDER — REVEFENACIN 175 MCG/3ML IN SOLN
175.0000 ug | Freq: Every day | RESPIRATORY_TRACT | Status: DC
  Administered 2022-03-26 – 2022-03-28 (×3): 175 ug via RESPIRATORY_TRACT
  Filled 2022-03-26 (×5): qty 3

## 2022-03-26 MED ORDER — BUDESONIDE 0.5 MG/2ML IN SUSP
0.5000 mg | Freq: Two times a day (BID) | RESPIRATORY_TRACT | Status: DC
  Administered 2022-03-26 – 2022-03-28 (×5): 0.5 mg via RESPIRATORY_TRACT
  Filled 2022-03-26 (×5): qty 2

## 2022-03-26 MED ORDER — ALBUTEROL SULFATE HFA 108 (90 BASE) MCG/ACT IN AERS: 2.0000 | INHALATION_SPRAY | RESPIRATORY_TRACT | Status: DC | PRN

## 2022-03-26 MED ORDER — VANCOMYCIN HCL IN DEXTROSE 1-5 GM/200ML-% IV SOLN: 1000.0000 mg | Freq: Once | INTRAVENOUS | Status: DC

## 2022-03-26 MED ORDER — ARFORMOTEROL TARTRATE 15 MCG/2ML IN NEBU
15.0000 ug | INHALATION_SOLUTION | Freq: Two times a day (BID) | RESPIRATORY_TRACT | Status: DC
  Administered 2022-03-26 – 2022-03-28 (×5): 15 ug via RESPIRATORY_TRACT
  Filled 2022-03-26 (×5): qty 2

## 2022-03-26 MED ORDER — PANTOPRAZOLE SODIUM 40 MG PO TBEC: 40.0000 mg | DELAYED_RELEASE_TABLET | Freq: Every day | ORAL | Status: DC

## 2022-03-26 MED ORDER — ALPRAZOLAM 0.5 MG PO TABS
1.0000 mg | ORAL_TABLET | Freq: Three times a day (TID) | ORAL | Status: DC | PRN
  Administered 2022-03-26 – 2022-03-27 (×3): 1 mg via ORAL
  Filled 2022-03-26 (×3): qty 2

## 2022-03-26 MED ORDER — PANTOPRAZOLE SODIUM 40 MG IV SOLR
40.0000 mg | INTRAVENOUS | Status: DC
  Administered 2022-03-26 – 2022-03-28 (×3): 40 mg via INTRAVENOUS
  Filled 2022-03-26 (×3): qty 10

## 2022-03-26 MED ORDER — ALBUTEROL SULFATE (2.5 MG/3ML) 0.083% IN NEBU
2.5000 mg | INHALATION_SOLUTION | Freq: Four times a day (QID) | RESPIRATORY_TRACT | Status: DC
  Administered 2022-03-26 – 2022-03-28 (×10): 2.5 mg via RESPIRATORY_TRACT
  Filled 2022-03-26 (×10): qty 3

## 2022-03-26 MED ORDER — ATORVASTATIN CALCIUM 10 MG PO TABS: 20.0000 mg | ORAL_TABLET | Freq: Every day | ORAL | Status: DC

## 2022-03-26 MED ORDER — CHLORHEXIDINE GLUCONATE CLOTH 2 % EX PADS
6.0000 | MEDICATED_PAD | Freq: Every day | CUTANEOUS | Status: DC
  Administered 2022-03-26 – 2022-03-28 (×4): 6 via TOPICAL

## 2022-03-26 MED ORDER — MAGNESIUM SULFATE 2 GM/50ML IV SOLN
2.0000 g | Freq: Once | INTRAVENOUS | Status: AC
  Administered 2022-03-26: 2 g via INTRAVENOUS
  Filled 2022-03-26: qty 50

## 2022-03-26 MED ORDER — ENOXAPARIN SODIUM 30 MG/0.3ML IJ SOSY
30.0000 mg | PREFILLED_SYRINGE | INTRAMUSCULAR | Status: DC
  Administered 2022-03-26 – 2022-03-28 (×3): 30 mg via SUBCUTANEOUS
  Filled 2022-03-26 (×3): qty 0.3

## 2022-03-26 MED ORDER — MEDIHONEY WOUND/BURN DRESSING EX PSTE
1.0000 | PASTE | Freq: Every day | CUTANEOUS | Status: DC
  Administered 2022-03-26 – 2022-03-28 (×3): 1 via TOPICAL
  Filled 2022-03-26: qty 44

## 2022-03-26 MED ORDER — METHYLPREDNISOLONE SODIUM SUCC 125 MG IJ SOLR
125.0000 mg | INTRAMUSCULAR | Status: DC
  Administered 2022-03-26 – 2022-03-28 (×3): 125 mg via INTRAVENOUS
  Filled 2022-03-26 (×3): qty 2

## 2022-03-26 NOTE — ED Notes (Signed)
Pt still has not voided and denies need to do so. Bladder scan performed and showed >505 ml of urine in bladder. Dr Josephine Cables made aware.

## 2022-03-26 NOTE — Plan of Care (Signed)
  Problem: Acute Rehab PT Goals(only PT should resolve) Goal: Pt Will Go Supine/Side To Sit Outcome: Progressing Flowsheets (Taken 03/26/2022 1226) Pt will go Supine/Side to Sit: with min guard assist Goal: Patient Will Transfer Sit To/From Stand Outcome: Progressing Flowsheets (Taken 03/26/2022 1226) Patient will transfer sit to/from stand: with min guard assist Goal: Pt Will Transfer Bed To Chair/Chair To Bed Outcome: Progressing Flowsheets (Taken 03/26/2022 1226) Pt will Transfer Bed to Chair/Chair to Bed:  min guard assist  with min assist Goal: Pt Will Ambulate Outcome: Progressing Flowsheets (Taken 03/26/2022 1226) Pt will Ambulate:  25 feet  with min guard assist  with minimal assist  with rolling walker   12:26 PM, 03/26/22 Lonell Grandchild, MPT Physical Therapist with [facility] [phone] office [phone] mobile phone

## 2022-03-26 NOTE — Plan of Care (Signed)
  Problem: Acute Rehab OT Goals (only OT should resolve) Goal: Pt. Will Perform Grooming Flowsheets (Taken 03/26/2022 0940) Pt Will Perform Grooming:  with modified independence  sitting Goal: Pt. Will Perform Lower Body Bathing Flowsheets (Taken 03/26/2022 0940) Pt Will Perform Lower Body Bathing:  with min guard assist  sitting/lateral leans  with adaptive equipment Goal: Pt. Will Perform Lower Body Dressing Flowsheets (Taken 03/26/2022 0940) Pt Will Perform Lower Body Dressing:  with adaptive equipment  sitting/lateral leans  with min guard assist Goal: Pt. Will Transfer To Toilet Flowsheets (Taken 03/26/2022 0940) Pt Will Transfer to Toilet:  with modified independence  ambulating Goal: Pt. Will Perform Toileting-Clothing Manipulation Flowsheets (Taken 03/26/2022 0940) Pt Will Perform Toileting - Clothing Manipulation and hygiene:  with set-up  sitting/lateral leans Goal: Pt/Caregiver Will Perform Home Exercise Program Flowsheets (Taken 03/26/2022 0940) Pt/caregiver will Perform Home Exercise Program:  Increased strength  Both right and left upper extremity  Independently  Frandy Basnett OT, MOT

## 2022-03-26 NOTE — Evaluation (Signed)
Physical Therapy Evaluation Patient Details Name: Donna Watkins MRN: [id number] DOB: 07/30/1960 Today's Date: 03/26/2022  History of Present Illness  Donna Watkins is a 62 y.o. female with medical history significant of lung cancer, COPD, chronic respiratory failure with hypoxia on 3 LPM of oxygen, GERD, hyperlipidemia, depression and anxiety who presents to the emergency department via EMS due to shortness of breath.  Patient was on BiPAP and was unable to express herself effectively, so history was obtained from daughter at bedside and from ED physician.  Per report, she had a mechanical fall yesterday without any noticeable injury, she had a worsening shortness of breath today, physical therapist followed up on him today and noted O2 sat to be 85% when ambulating, so her supplemental oxygen was increased to 4 L, EMS was activated and patient did receive an albuterol breathing treatment prior to arrival to the ED. (per DO)   Clinical Impression  Patient had difficulty sitting up at bedside mostly due to sacral wound, unsteady on feet when taking steps with hand held assist, safer using RW and limited to a few steps at bedside mostly due to difficulty breathing having to be on BiPAP.  Patient put back to bed after therapy.  Patient will benefit from continued skilled physical therapy in hospital and recommended venue below to increase strength, balance, endurance for safe ADLs and gait.        Recommendations for follow up therapy are one component of a multi-disciplinary discharge planning process, led by the attending physician.  Recommendations may be updated based on patient status, additional functional criteria and insurance authorization.  Follow Up Recommendations Home health PT      Assistance Recommended at Discharge Set up Supervision/Assistance  Patient can return home with the following  A little help with walking and/or transfers;A little help with  bathing/dressing/bathroom;Assistance with cooking/housework;Help with stairs or ramp for entrance    Equipment Recommendations None recommended by PT  Recommendations for Other Services       Functional Status Assessment Patient has had a recent decline in their functional status and demonstrates the ability to make significant improvements in function in a reasonable and predictable amount of time.     Precautions / Restrictions Precautions Precautions: Fall Restrictions Weight Bearing Restrictions: No      Mobility  Bed Mobility Overal bed mobility: Needs Assistance Bed Mobility: Supine to Sit, Sit to Supine     Supine to sit: Min assist Sit to supine: Min guard   General bed mobility comments: Need for hand held assist to pull to sit. Slow labored movement.    Transfers Overall transfer level: Needs assistance Equipment used: Rolling walker (2 wheels) Transfers: Sit to/from Stand, Bed to chair/wheelchair/BSC Sit to Stand: Min guard, Min assist   Step pivot transfers: Min assist, Min guard       General transfer comment: unsteady movement requiring hand held assist to maintain balance, safer using RW    Ambulation/Gait Ambulation/Gait assistance: Min assist Gait Distance (Feet): 8 Feet Assistive device: Rolling walker (2 wheels), 1 person hand held assist Gait Pattern/deviations: Decreased step length - right, Decreased step length - left, Decreased stride length Gait velocity: decreased     General Gait Details: unsteady labored movement requiring hand held assist when taking steps without AD, safer using RW with good return demonstrated, limited mostly due to on BiPAP secondary to having difficulty breathing  Science writer  Modified Rankin (Stroke Patients Only)       Balance Overall balance assessment: Needs assistance Sitting-balance support: Feet supported, No upper extremity supported Sitting balance-Leahy Scale:  Fair Sitting balance - Comments: fair/good seated at EOB   Standing balance support: During functional activity, Bilateral upper extremity supported Standing balance-Leahy Scale: Fair Standing balance comment: using RW                             Pertinent Vitals/Pain Pain Assessment Pain Assessment: Faces Faces Pain Scale: Hurts a little bit Pain Location: sacral wound Pain Descriptors / Indicators: Sore, Guarding, Grimacing Pain Intervention(s): Limited activity within patient's tolerance, Monitored during session, Repositioned    Home Living Family/patient expects to be discharged to:: Private residence Living Arrangements: Other relatives Available Help at Discharge: Family;Available 24 hours/day Type of Home: House Home Access: Stairs to enter Entrance Stairs-Rails: Right;Left;Can reach both Entrance Stairs-Number of Steps: 4 steps in back with bilateral side rails, 4 steps in front without rails   Home Layout: One level Home Equipment: Grab bars - tub/shower;Shower Land (2 wheels);Wheelchair - manual      Prior Function Prior Level of Function : Driving;Needs assist       Physical Assist : Mobility (physical);ADLs (physical) Mobility (physical): Gait;Transfers;Stairs ADLs (physical): Dressing;Toileting;IADLs;Bathing Mobility Comments: Pt initially reported community ambulation without AD, but later stated that her family walks with her. ADLs Comments: Pt reported independence at first, then later reported that she needs help with bathing, dressing, and toileting.     Hand Dominance   Dominant Hand: Right    Extremity/Trunk Assessment   Upper Extremity Assessment Upper Extremity Assessment: Defer to OT evaluation    Lower Extremity Assessment Lower Extremity Assessment: Generalized weakness    Cervical / Trunk Assessment Cervical / Trunk Assessment: Normal  Communication   Communication: No difficulties  Cognition  Arousal/Alertness: Awake/alert Behavior During Therapy: WFL for tasks assessed/performed Overall Cognitive Status: Within Functional Limits for tasks assessed                                          General Comments      Exercises     Assessment/Plan    PT Assessment Patient needs continued PT services  PT Problem List Decreased strength;Decreased activity tolerance;Decreased balance;Decreased mobility       PT Treatment Interventions      PT Goals (Current goals can be found in the Care Plan section)  Acute Rehab PT Goals Patient Stated Goal: return home with family to assist PT Goal Formulation: With patient Time For Goal Achievement: 03/30/22 Potential to Achieve Goals: Good    Frequency Min 3X/week     Co-evaluation PT/OT/SLP Co-Evaluation/Treatment: Yes Reason for Co-Treatment: To address functional/ADL transfers PT goals addressed during session: Mobility/safety with mobility;Balance;Proper use of DME OT goals addressed during session: ADL's and self-care       AM-PAC PT "6 Clicks" Mobility  Outcome Measure Help needed turning from your back to your side while in a flat bed without using bedrails?: A Little Help needed moving from lying on your back to sitting on the side of a flat bed without using bedrails?: A Lot Help needed moving to and from a bed to a chair (including a wheelchair)?: A Little Help needed standing up from a chair using your arms (e.g., wheelchair or bedside chair)?:  A Little Help needed to walk in hospital room?: A Little Help needed climbing 3-5 steps with a railing? : A Lot 6 Click Score: 16    End of Session Equipment Utilized During Treatment: Oxygen;Other (comment) (BiPAP) Activity Tolerance: Patient tolerated treatment well;Patient limited by fatigue Patient left: in bed;with call bell/phone within reach Nurse Communication: Mobility status PT Visit Diagnosis: Unsteadiness on feet (R26.81);Other abnormalities  of gait and mobility (R26.89);Muscle weakness (generalized) (M62.81)    Time: [id number] PT Time Calculation (min) (ACUTE ONLY): 30 min   Charges:   PT Evaluation $PT Eval Low Complexity: 1 Low $PT Eval Moderate Complexity: 1 Mod PT Treatments $Therapeutic Activity: 23-37 mins        12:24 PM, 03/26/22 Lonell Grandchild, MPT Physical Therapist with [facility] [phone] office [phone] mobile phone

## 2022-03-26 NOTE — ED Notes (Signed)
Notified Dr Josephine Cables that pt has not voided since this nurse came on shift (7pm) and that pt states she does not feel like she needs to use the bathroom. Bladder scan performed which showed > 460ml of urine in bladder. New orders given for intake and outputs only.

## 2022-03-26 NOTE — Evaluation (Addendum)
Occupational Therapy Evaluation Patient Details Name: Donna Watkins MRN: [id number] DOB: Sep 07, 1960 Today's Date: 03/26/2022   History of Present Illness Donna Watkins is a 62 y.o. female with medical history significant of lung cancer, COPD, chronic respiratory failure with hypoxia on 3 LPM of oxygen, GERD, hyperlipidemia, depression and anxiety who presents to the emergency department via EMS due to shortness of breath.  Patient was on BiPAP and was unable to express herself effectively, so history was obtained from daughter at bedside and from ED physician.  Per report, she had a mechanical fall yesterday without any noticeable injury, she had a worsening shortness of breath today, physical therapist followed up on him today and noted O2 sat to be 85% when ambulating, so her supplemental oxygen was increased to 4 L, EMS was activated and patient did receive an albuterol breathing treatment prior to arrival to the ED. (per DO)   Clinical Impression   Pt agreeable to OT and PT co-evaluation. Pt initially reported independence at home but then later stated that she requries assist for ambulation and some ADL's. Pt presents generally weak needing support to pull to sit. Min G to min A for short distance ambulation in the room. Pt donning BiPap through entire session today. Pt reports 24/7 assist at home. Pt was left in the bed with call bell within reach. Pt will benefit from continued OT in the hospital and recommended venue below to increase strength, balance, and endurance for safe ADL's.        Recommendations for follow up therapy are one component of a multi-disciplinary discharge planning process, led by the attending physician.  Recommendations may be updated based on patient status, additional functional criteria and insurance authorization.   Follow Up Recommendations  Home health OT     Assistance Recommended at Discharge Frequent or constant Supervision/Assistance  Patient  can return home with the following A little help with walking and/or transfers;A lot of help with bathing/dressing/bathroom;Assistance with cooking/housework;Assist for transportation;Help with stairs or ramp for entrance    Functional Status Assessment  Patient has had a recent decline in their functional status and demonstrates the ability to make significant improvements in function in a reasonable and predictable amount of time.  Equipment Recommendations  None recommended by OT    Recommendations for Other Services       Precautions / Restrictions Precautions Precautions: Fall Restrictions Weight Bearing Restrictions: No      Mobility Bed Mobility Overal bed mobility: Needs Assistance Bed Mobility: Supine to Sit, Sit to Supine     Supine to sit: Min assist Sit to supine: Min guard   General bed mobility comments: Need for hand held assist to pull to sit. Slow labored movement.    Transfers Overall transfer level: Needs assistance Equipment used: Rolling walker (2 wheels) Transfers: Sit to/from Stand, Bed to chair/wheelchair/BSC Sit to Stand: Min guard, Min assist     Step pivot transfers: Min assist, Min guard     General transfer comment: Step pivot simulated via steps forward and laterally with RW. Min G to min A.      Balance Overall balance assessment: Needs assistance Sitting-balance support: No upper extremity supported, Feet supported Sitting balance-Leahy Scale: Fair Sitting balance - Comments: fair to good at EOB   Standing balance support: Bilateral upper extremity supported, During functional activity Standing balance-Leahy Scale: Fair Standing balance comment: using RW  ADL either performed or assessed with clinical judgement   ADL Overall ADL's : Needs assistance/impaired Eating/Feeding: Modified independent;Sitting   Grooming: Modified independent;Sitting   Upper Body Bathing: Set up;Sitting   Lower  Body Bathing: Maximal assistance;Sitting/lateral leans       Lower Body Dressing: Maximal assistance;Sitting/lateral leans Lower Body Dressing Details (indicate cue type and reason): Max A to don socks while pt sat at EOB. Toilet Transfer: Min guard;Minimal assistance;Ambulation;Rolling walker (2 wheels) Toilet Transfer Details (indicate cue type and reason): Simulated via sit to stand and ambulation forward and backwards at EOB. Toileting- Clothing Manipulation and Hygiene: Min guard;Minimal assistance;Sitting/lateral lean       Functional mobility during ADLs: Min guard;Minimal assistance;Rolling walker (2 wheels)       Vision Baseline Vision/History: 1 Wears glasses Ability to See in Adequate Light: 0 Adequate Patient Visual Report: No change from baseline (Pt reported some swelling around the eyes yesterday, but no vision change.) Vision Assessment?: No apparent visual deficits                Pertinent Vitals/Pain Pain Assessment Pain Assessment: Faces Faces Pain Scale: Hurts a little bit Pain Location: sacral wound Pain Descriptors / Indicators: Guarding Pain Intervention(s): Limited activity within patient's tolerance, Monitored during session, Repositioned     Hand Dominance Right   Extremity/Trunk Assessment Upper Extremity Assessment Upper Extremity Assessment: Generalized weakness   Lower Extremity Assessment Lower Extremity Assessment: Defer to PT evaluation   Cervical / Trunk Assessment Cervical / Trunk Assessment: Normal   Communication Communication Communication: No difficulties   Cognition Arousal/Alertness: Awake/alert Behavior During Therapy: WFL for tasks assessed/performed Overall Cognitive Status: Within Functional Limits for tasks assessed                                                        Home Living Family/patient expects to be discharged to:: Private residence Living Arrangements: Other relatives Available  Help at Discharge: Family;Available 24 hours/day Type of Home: House Home Access: Stairs to enter [employer] of Steps: 4 steps in back with bilateral side rails, 4 steps in front without rails Entrance Stairs-Rails: Right;Left;Can reach both Home Layout: One level     Bathroom Shower/Tub: Occupational psychologist: Standard Bathroom Accessibility: Yes   Home Equipment Grab bars - tub/shower;Shower Land (2 wheels);Wheelchair - manual            Prior Functioning/Environment Prior Level of Function : Driving;Needs assist       Physical Assist : Mobility (physical);ADLs (physical) Mobility (physical): Gait;Transfers;Stairs ADLs (physical): Dressing;Toileting;IADLs;Bathing Mobility Comments: Pt initially reported community ambulation without AD, but later stated that her family walks with her. ADLs Comments: Pt reported independence at first, then later reported that she needs help with bathing, dressing, and toileting.        OT Problem List: Decreased strength;Decreased activity tolerance;Impaired balance (sitting and/or standing);Pain      OT Treatment/Interventions: Self-care/ADL training;Therapeutic exercise;Therapeutic activities;Patient/family education;Balance training;DME and/or AE instruction    OT Goals(Current goals can be found in the care plan section) Acute Rehab OT Goals Patient Stated Goal: return home OT Goal Formulation: With patient Time For Goal Achievement: 04/09/22 Potential to Achieve Goals: Good  OT Frequency: Min 2X/week    Co-evaluation PT/OT/SLP Co-Evaluation/Treatment: Yes Reason for Co-Treatment: To address functional/ADL transfers   OT  goals addressed during session: ADL's and self-care      AM-PAC OT "6 Clicks" Daily Activity     Outcome Measure Help from another person eating meals?: None Help from another person taking care of personal grooming?: A Little Help from another person toileting, which  includes using toliet, bedpan, or urinal?: A Little Help from another person bathing (including washing, rinsing, drying)?: A Lot Help from another person to put on and taking off regular upper body clothing?: A Little Help from another person to put on and taking off regular lower body clothing?: A Lot 6 Click Score: 17   End of Session Equipment Utilized During Treatment: Rolling walker (2 wheels)  Activity Tolerance: Patient tolerated treatment well Patient left: in bed;with call bell/phone within reach  OT Visit Diagnosis: Unsteadiness on feet (R26.81);Other abnormalities of gait and mobility (R26.89);Muscle weakness (generalized) (M62.81)                Time: [id number] OT Time Calculation (min): 17 min Charges:  OT General Charges $OT Visit: 1 Visit OT Evaluation $OT Eval Low Complexity: 1 Low  Yarima Penman OT, MOT  Larey Seat 03/26/2022, 9:37 AM

## 2022-03-26 NOTE — ED Notes (Signed)
PT at bedside.

## 2022-03-26 NOTE — Hospital Course (Signed)
62 year old female with a history of COPD, chronic respiratory failure on 3 L, lung cancer status post XRT, anxiety, hyperlipidemia presenting from home secondary to hypoxia and tachycardia.  The patient states that she had been in her usual state of health until 03/25/2022 when she was working with home health physical therapy.  Apparently, home health PT noted the patient to be hypoxic with ambulation into the 85% on her usual 3 L.  The patient apparently was tachycardic into the 100s.  As result, EMS was activated.  Upon EMS arrival, she was noted to have oxygen saturation in the high 80s.  Show her oxygen was increased to 4 L. The patient herself states that she had been in her usual state of health.  She had denied any fevers, chills, chest pain, worsening cough, hemoptysis, nausea, vomiting, diarrhea, abdominal pain.  She states that her breathing was as same as usual.  She is rather sedentary and sits on the couch most of the day.  She needs assistance with her ADLs.  In the ED, the patient was afebrile and hemodynamically stable.  She was placed on BiPAP.  WBC 11.6, hemoglobin 12.9, platelets 141,000.  Sodium 142, potassium 3.7, bicarbonate >45.  Serum creatinine 0.44.  AST 211, ALT 670, alkaline phosphatase 97, total bilirubin 1.0.  CTA chest was negative for PE.  Showed a 1.9 x 1.4 cmright lower lobe nodule.  The patient was given DuoNebs and IV Solu-Medrol.  She was admitted for acute on chronic respiratory failure.

## 2022-03-26 NOTE — Progress Notes (Signed)
PROGRESS NOTE  Donna Watkins [id number] DOB: May 15, 1960 DOA: 03/25/2022 PCP: Lemmie Evens, MD  Brief History:  62 year old female with a history of COPD, chronic respiratory failure on 3 L, lung cancer status post XRT, anxiety, hyperlipidemia presenting from home secondary to hypoxia and tachycardia.  The patient states that she had been in her usual state of health until 03/25/2022 when she was working with home health physical therapy.  Apparently, home health PT noted the patient to be hypoxic with ambulation into the 85% on her usual 3 L.  The patient apparently was tachycardic into the 100s.  As result, EMS was activated.  Upon EMS arrival, she was noted to have oxygen saturation in the high 80s.  Show her oxygen was increased to 4 L. The patient herself states that she had been in her usual state of health.  She had denied any fevers, chills, chest pain, worsening cough, hemoptysis, nausea, vomiting, diarrhea, abdominal pain.  She states that her breathing was as same as usual.  She is rather sedentary and sits on the couch most of the day.  She needs assistance with her ADLs.  In the ED, the patient was afebrile and hemodynamically stable.  She was placed on BiPAP.  WBC 11.6, hemoglobin 12.9, platelets 141,000.  Sodium 142, potassium 3.7, bicarbonate >45.  Serum creatinine 0.44.  AST 211, ALT 670, alkaline phosphatase 97, total bilirubin 1.0.  CTA chest was negative for PE.  Showed a 1.9 x 1.4 cmright lower lobe nodule.  The patient was given DuoNebs and IV Solu-Medrol.  She was admitted for acute on chronic respiratory failure.   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 -general surgery consult  Unstageable sacral decubitus ulcer -Wound care consult -There is some mild cellulitis surrounding the wound  Anxiety -Continue home dose alprazolam -PDMP reviewed -Patient received Xanax 1 mg, #150 monthly, last refill 02/19/2022  Hypomagnesemia -Replete  Mixed hyperlipidemia -Holding statin secondary to elevated LFTs  GERD -Continue PPI  Thrombocytopenia -Appears to be chronic -monitor for signs of bleeding       Family Communication:  no  Family at bedside  Consultants:  general surgery  Code Status:  FULL   DVT Prophylaxis:   [place] Lovenox   Procedures: As Listed in Progress Note Above  Antibiotics: None     Subjective: Patient states that she is breathing better than last night.  She denies any fevers, chills, chest pain, cough, hemoptysis, nausea, vomiting, diarrhea, abdominal pain.  Objective: Vitals:   03/26/22 0430 03/26/22 0500 03/26/22 0600 03/26/22 0630  BP: 109/64 104/64 (!) 100/59 113/67  Pulse: 94 86 94 85  Resp: (!) 31 (!) 27 (!) 24 19  Temp:  97.9 F (36.6 C)    TempSrc:  Axillary    SpO2: 95% 95% 98% 95%  Weight:      Height:        Intake/Output Summary (Last 24 hours) at 03/26/2022 0706 Last data filed at 03/26/2022 0542 Gross per 24 hour  Intake 1300 ml  Output 500 ml  Net 800 ml   Weight change:  Exam:  General:  Pt is alert, follows commands appropriately, not in acute distress HEENT: No icterus, No thrush, No neck mass, [place]/AT Cardiovascular: RRR, S1/S2, no rubs, no gallops Respiratory: Bibasilar rales.  Diminished breath sounds bilateral. Abdomen: Soft/+BS, non tender, non distended, no guarding Extremities: No edema, No lymphangitis, No petechiae, No rashes,  no synovitis   Data Reviewed: I have personally reviewed following labs and imaging studies Basic Metabolic Panel: Recent Labs  Lab 03/25/22 1548 03/26/22 0606  NA 142  --   K 3.7  --   CL 79*  --   CO2 >45*  --   GLUCOSE 117*  --   BUN 18  --   CREATININE 0.44  --   CALCIUM 9.0  --   MG 2.0 1.6*  PHOS  --  2.9   Liver Function Tests: Recent Labs  Lab 03/25/22 1548  AST 211*  ALT 670*  ALKPHOS 97  BILITOT 1.0  PROT 6.5  ALBUMIN 3.2*   No results for input(s): "LIPASE", "AMYLASE" in the last 168 hours. No results for input(s): "AMMONIA" in the last 168 hours. Coagulation Profile: No results for input(s): "INR", "PROTIME" in the last 168 hours. CBC: Recent Labs  Lab 03/25/22 1548  WBC 11.6*  NEUTROABS 10.7*  HGB 12.9  HCT 45.7  MCV 112.3*  PLT 141*   Cardiac Enzymes: No results for input(s): "CKTOTAL", "CKMB", "CKMBINDEX", "TROPONINI" in the last 168 hours. BNP: Invalid input(s): "POCBNP" CBG: No results for input(s): "GLUCAP" in the last 168 hours. HbA1C: No results for input(s): "HGBA1C" in the last 72 hours. Urine analysis:    Component Value Date/Time   COLORURINE YELLOW 03/26/2022 0530   APPEARANCEUR CLEAR 03/26/2022 0530   LABSPEC >1.046 (H) 03/26/2022 0530   PHURINE 5.0 03/26/2022 0530   GLUCOSEU NEGATIVE 03/26/2022 0530   HGBUR NEGATIVE 03/26/2022 0530   BILIRUBINUR NEGATIVE 03/26/2022 0530   KETONESUR 5 (A) 03/26/2022 0530   PROTEINUR 30 (A) 03/26/2022 0530   UROBILINOGEN 1.0 06/13/2011 0148   NITRITE NEGATIVE 03/26/2022 0530   LEUKOCYTESUR NEGATIVE 03/26/2022 0530   Sepsis Labs: @LABRCNTIP (procalcitonin:4,lacticidven:4) ) Recent Results (from the past 240 hour(s))  Resp panel by RT-PCR (RSV, Flu A&B, Covid) Anterior Nasal Swab     Status: None   Collection Time: 03/25/22  4:22 PM   Specimen: Anterior Nasal Swab  Result Value Ref Range Status   SARS Coronavirus 2 by RT PCR NEGATIVE NEGATIVE Final    Comment: (NOTE) SARS-CoV-2  target nucleic acids are NOT DETECTED.  The SARS-CoV-2 RNA is generally detectable in upper respiratory specimens during the acute phase of infection. The lowest concentration of SARS-CoV-2 viral copies this assay can detect is 138 copies/mL. A negative result does not preclude SARS-Cov-2 infection and should not be used as the sole basis for treatment or other patient management decisions. A negative result may occur with  improper specimen collection/handling, submission of specimen other than nasopharyngeal swab, presence of viral mutation(s) within the areas targeted by this assay, and inadequate number of viral copies(<138 copies/mL). A negative result must be combined with clinical observations, patient history, and epidemiological information. The expected result is Negative.  Fact Sheet for Patients:  EntrepreneurPulse.com.au  Fact Sheet for Healthcare Providers:  IncredibleEmployment.be  This test is no t yet approved or cleared by the [place] FDA and  has been authorized for detection and/or diagnosis of SARS-CoV-2 by FDA under an Emergency Use Authorization (  EUA). This EUA will remain  in effect (meaning this test can be used) for the duration of the COVID-19 declaration under Section 564(b)(1) of the Act, 21 U.S.C.section 360bbb-3(b)(1), unless the authorization is terminated  or revoked sooner.       Influenza A by PCR NEGATIVE NEGATIVE Final   Influenza B by PCR NEGATIVE NEGATIVE Final    Comment: (NOTE) The Xpert Xpress SARS-CoV-2/FLU/RSV plus assay is intended as an aid in the diagnosis of influenza from Nasopharyngeal swab specimens and should not be used as a sole basis for treatment. Nasal washings and aspirates are unacceptable for Xpert Xpress SARS-CoV-2/FLU/RSV testing.  Fact Sheet for Patients: EntrepreneurPulse.com.au  Fact Sheet for Healthcare  Providers: IncredibleEmployment.be  This test is not yet approved or cleared by the [place] FDA and has been authorized for detection and/or diagnosis of SARS-CoV-2 by FDA under an Emergency Use Authorization (EUA). This EUA will remain in effect (meaning this test can be used) for the duration of the COVID-19 declaration under Section 564(b)(1) of the Act, 21 U.S.C. section 360bbb-3(b)(1), unless the authorization is terminated or revoked.     Resp Syncytial Virus by PCR NEGATIVE NEGATIVE Final    Comment: (NOTE) Fact Sheet for Patients: EntrepreneurPulse.com.au  Fact Sheet for Healthcare Providers: IncredibleEmployment.be  This test is not yet approved or cleared by the [place] FDA and has been authorized for detection and/or diagnosis of SARS-CoV-2 by FDA under an Emergency Use Authorization (EUA). This EUA will remain in effect (meaning this test can be used) for the duration of the COVID-19 declaration under Section 564(b)(1) of the Act, 21 U.S.C. section 360bbb-3(b)(1), unless the authorization is terminated or revoked.  Performed at [facility], [address], [place], [place] [address]   Blood Culture (routine x 2)     Status: None (Preliminary result)   Collection Time: 03/25/22  8:05 PM   Specimen: BLOOD  Result Value Ref Range Status   Specimen Description BLOOD BLOOD RIGHT ARM  Final   Special Requests   Final    BOTTLES DRAWN AEROBIC AND ANAEROBIC Blood Culture adequate volume Performed at [facility], [address] [place], [place] [address]    Culture PENDING  Incomplete   Report Status PENDING  Incomplete  Blood Culture (routine x 2)     Status: None (Preliminary result)   Collection Time: 03/25/22  8:13 PM   Specimen: BLOOD  Result Value Ref Range Status   Specimen Description BLOOD BLOOD RIGHT WRIST  Final   Special Requests   Final    BOTTLES DRAWN AEROBIC AND ANAEROBIC Blood Culture  adequate volume Performed at [facility], [address], [place], [place] [address]    Culture PENDING  Incomplete   Report Status PENDING  Incomplete     Scheduled Meds:  enoxaparin (LOVENOX) injection  30 mg Subcutaneous Q24H   feeding supplement  237 mL Oral BID BM   pantoprazole  40 mg Oral Daily   Continuous Infusions:  lactated ringers 150 mL/hr at 03/26/22 0425   vancomycin      Procedures/Studies: CT ABDOMEN PELVIS W CONTRAST  Result Date: 03/25/2022 CLINICAL DATA:  Fall, abdominal pain. EXAM: CT ABDOMEN AND PELVIS WITH CONTRAST TECHNIQUE: Multidetector CT imaging of the abdomen and pelvis was performed using the standard protocol following bolus administration of intravenous contrast. RADIATION DOSE REDUCTION: This exam was performed according to the departmental dose-optimization program which includes automated exposure control, adjustment of the mA and/or kV according to patient size and/or use  of iterative reconstruction technique. CONTRAST:  166mL OMNIPAQUE IOHEXOL 350 MG/ML SOLN COMPARISON:  06/25/2017 FINDINGS: Lower chest: See CTA chest report today Hepatobiliary: Gallbladder wall calcifications compatible with a porcelain gallbladder. This is unchanged since prior study. No visible stones. Small hypodensity in the right hepatic lobe is most compatible with small cyst. Pancreas: No focal abnormality or ductal dilatation. Spleen: No focal abnormality.  Normal size. Adrenals/Urinary Tract: No adrenal abnormality. No focal renal abnormality. No stones or hydronephrosis. Urinary bladder is unremarkable. Stomach/Bowel: Large stool burden throughout the colon. Stomach, large and small bowel grossly unremarkable. Vascular/Lymphatic: Scattered aortic calcifications. No evidence of aneurysm or adenopathy. Reproductive: Uterus and adnexa unremarkable.  No mass. Other: No free fluid or free air. Musculoskeletal: There appears to be a sacral decubitus ulcer with gas in the soft tissues  overlying the sacrum extending to the skin surface. No bony changes of osteomyelitis. No acute bony abnormality. 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 Large stool burden in the colon. Scattered aortic atherosclerosis. Soft tissue gas in the soft tissues overlying the sacrum most compatible with sacral decubitus ulcer. No underlying changes of osteomyelitis. Electronically Signed   By: Rolm Baptise M.D.   On: 03/25/2022 19:14   CT Angio Chest PE W and/or Wo Contrast  Result Date: 03/25/2022 CLINICAL DATA:  Pulmonary embolism (PE) suspected, low to intermediate prob, positive D-dimer Pulmonary embolism (PE) suspected, high prob. Fall. Shortness of breath. EXAM: CT ANGIOGRAPHY CHEST WITH CONTRAST TECHNIQUE: Multidetector CT imaging of the chest was performed using the standard protocol during bolus administration of intravenous contrast. Multiplanar CT image reconstructions and MIPs were obtained to evaluate the vascular anatomy. RADIATION DOSE REDUCTION: This exam was performed according to the departmental dose-optimization program which includes automated exposure control, adjustment of the mA and/or kV according to patient size and/or use of iterative reconstruction technique. CONTRAST:  186mL OMNIPAQUE IOHEXOL 350 MG/ML SOLN COMPARISON:  01/12/2022.  PET CT 02/05/2022. FINDINGS: Cardiovascular: No filling defects in the pulmonary arteries to suggest pulmonary emboli. Heart is normal size. Aorta is normal caliber. Scattered aortic calcifications. Mediastinum/Nodes: No mediastinal, hilar, or axillary adenopathy. Trachea and esophagus are unremarkable. Thyroid unremarkable. Lungs/Pleura: Moderate to advanced emphysema. Bilobed right lower lobe pulmonary nodule measures 1.9 x 1.4 cm. This is increased in size, previously measuring 1.4 x 1.3 cm. This is concerning for malignancy. Areas of scarring in the left lower lobe. No effusions. Upper Abdomen: No acute findings  Musculoskeletal: Chest wall soft tissues are unremarkable. No acute bony abnormality. Review of the MIP images confirms the above findings. IMPRESSION: No evidence of pulmonary embolus. 1.9 x 1.4 cm bilobed right lower lobe pulmonary nodule, increasing in size since prior study. This is concerning for malignancy. This has been previously evaluated with PET CT. Aortic Atherosclerosis (ICD10-I70.0) and Emphysema (ICD10-J43.9). Electronically Signed   By: Rolm Baptise M.D.   On: 03/25/2022 19:11   DG Chest Port 1 View  Result Date: 03/25/2022 CLINICAL DATA:  Shortness of breath EXAM: PORTABLE CHEST 1 VIEW COMPARISON:  02/09/2022 FINDINGS: Unchanged cardiac and mediastinal contours. Redemonstrated hyperinflation, compatible with COPD. Redemonstrated right lower lung nodule, better evaluated on the 11/26/2021 CTA and 02/05/2022 PET-CT. No new focal pulmonary opacity. No pleural effusion or pneumothorax. No acute osseous abnormality. IMPRESSION: 1. No acute cardiopulmonary process. 2. Redemonstrated right lower lung nodule, better evaluated on the 11/26/2021 CTA and 02/05/2022 PET-CT. Electronically Signed   By: Merilyn Baba M.D.   On: 03/25/2022 16:20    Orson Eva,  DO  [facility]  If 7PM-7AM, please contact night-coverage [email or web address] Password [email or web address] 03/26/2022, 7:06 AM   LOS: 0 days

## 2022-03-26 NOTE — Consult Note (Addendum)
Reason for Consult: Sacral decubitus ulcer Referring Physician: Dr. [name] is an 62 y.o. female.  HPI: Patient is a 62 year old white female with multiple medical problems who presented to the emergency room for evaluation of shortness of breath.  Her past medical history is significant for COPD, lung cancer, chronic respiratory failure with home O2.  She was noted on physical examination to have a sacral decubitus ulcer.  Surgery has been asked to debride the ulcer.  Past Medical History:  Diagnosis Date   Anxiety    panic attacks   Cancer (HCC)    COPD (chronic obstructive pulmonary disease) ([place]) 03/01/2012   Depression    GERD (gastroesophageal reflux disease)    History of radiation therapy    Left Lung- 12/09/20-12/19/20- Dr. Gery Pray   Hyperlipidemia    Mild scoliosis    Oxygen dependent     Past Surgical History:  Procedure Laterality Date   BRONCHIAL BIOPSY  11/04/2020   Procedure: BRONCHIAL BIOPSIES;  Surgeon: Collene Gobble, MD;  Location: [facility];  Service: Pulmonary;;   BRONCHIAL BRUSHINGS  11/04/2020   Procedure: BRONCHIAL BRUSHINGS;  Surgeon: Collene Gobble, MD;  Location: [facility];  Service: Pulmonary;;   BRONCHIAL NEEDLE ASPIRATION BIOPSY  11/04/2020   Procedure: BRONCHIAL NEEDLE ASPIRATION BIOPSIES;  Surgeon: Collene Gobble, MD;  Location: [facility];  Service: Pulmonary;;   COLONOSCOPY N/A 09/09/2012   Procedure: COLONOSCOPY;  Surgeon: Danie Binder, MD;  [facility];  Service: Endoscopy;  Laterality: N/A;  1:15-moved to 12:45 Melanie notified pt   FIDUCIAL MARKER PLACEMENT  11/04/2020   Procedure: FIDUCIAL MARKER PLACEMENT;  Surgeon: Collene Gobble, MD;  Location: [facility];  Service: Pulmonary;;   TUBAL LIGATION     VIDEO BRONCHOSCOPY WITH ENDOBRONCHIAL NAVIGATION N/A 11/04/2020   Procedure: ROBOTIC  VIDEO BRONCHOSCOPY WITH ENDOBRONCHIAL NAVIGATION;  Surgeon: Collene Gobble, MD;  Location: [facility];   Service: Pulmonary;  Laterality: N/A;    Family History  Problem Relation Age of Onset   Heart disease Father    COPD Mother    Arthritis Sister    Colon cancer Neg Hx     Social History:  reports that she quit smoking about 10 years ago. Her smoking use included cigarettes. She has a 75.00 pack-year smoking history. She has never used smokeless tobacco. She reports that she does not drink alcohol and does not use drugs.  Allergies: No Known Allergies  Medications: I have reviewed the patient's current medications.  Results for orders placed or performed during the hospital encounter of 03/25/22 (from the past 48 hour(s))  Hepatitis panel, acute     Status: None   Collection Time: 03/25/22  3:30 PM  Result Value Ref Range   Hepatitis B Surface Ag NON REACTIVE NON REACTIVE   HCV Ab NON REACTIVE NON REACTIVE    Comment: (NOTE) Nonreactive HCV antibody screen is consistent with no HCV infections,  unless recent infection is suspected or other evidence exists to indicate HCV infection.     Hep A IgM NON REACTIVE NON REACTIVE   Hep B C IgM NON REACTIVE NON REACTIVE    Comment: Performed at [facility], [place] [address], [place], [place] [address]  Comprehensive metabolic panel     Status: Abnormal   Collection Time: 03/25/22  3:48 PM  Result Value Ref Range   Sodium 142 135 - 145 mmol/L   Potassium 3.7 3.5 - 5.1 mmol/L   Chloride  79 (L) 98 - 111 mmol/L   CO2 >45 (H) 22 - 32 mmol/L   Glucose, Bld 117 (H) 70 - 99 mg/dL    Comment: Glucose reference range applies only to samples taken after fasting for at least 8 hours.   BUN 18 6 - 20 mg/dL   Creatinine, Ser 0.44 0.44 - 1.00 mg/dL   Calcium 9.0 8.9 - 10.3 mg/dL   Total Protein 6.5 6.5 - 8.1 g/dL   Albumin 3.2 (L) 3.5 - 5.0 g/dL   AST 211 (H) 15 - 41 U/L   ALT 670 (H) 0 - 44 U/L   Alkaline Phosphatase 97 38 - 126 U/L   Total Bilirubin 1.0 0.3 - 1.2 mg/dL   GFR, Estimated >60 >60 mL/min    Comment: (NOTE) Calculated  using the CKD-EPI Creatinine Equation (2021)    Anion gap NOT CALCULATED 5 - 15    Comment: Performed at [facility], [address], [place], [place] [address]  Brain natriuretic peptide     Status: None   Collection Time: 03/25/22  3:48 PM  Result Value Ref Range   B Natriuretic Peptide 77.0 0.0 - 100.0 pg/mL    Comment: Performed at [facility], [address] [place], [place] [address]  Blood gas, venous     Status: Abnormal   Collection Time: 03/25/22  3:48 PM  Result Value Ref Range   pH, Ven 7.32 7.25 - 7.43   pCO2, Ven >123 (HH) 44 - 60 mmHg    Comment: CRITICAL RESULT CALLED TO, READ BACK BY AND VERIFIED WITH: CALLED TO A FRAZIER AT 1607 ON 03/25/2022 BY S DALTON    pO2, Ven <31 (LL) 32 - 45 mmHg    Comment: CRITICAL RESULT CALLED TO, READ BACK BY AND VERIFIED WITH: CALLED TO A FRAZIER AT 1607 ON 03/25/2022 BY S DALTON    Bicarbonate 73.7 (H) 20.0 - 28.0 mmol/L   Acid-Base Excess 37.8 (H) 0.0 - 2.0 mmol/L   O2 Saturation 36.3 %   Patient temperature 36.7    Collection site BLOOD RIGHT HAND    Drawn by [phone]     Comment: Performed at [facility], [address] [place], [place] [address]  CBC with Differential/Platelet     Status: Abnormal   Collection Time: 03/25/22  3:48 PM  Result Value Ref Range   WBC 11.6 (H) 4.0 - 10.5 K/uL   RBC 4.07 3.87 - 5.11 MIL/uL   Hemoglobin 12.9 12.0 - 15.0 g/dL   HCT 45.7 36.0 - 46.0 %   MCV 112.3 (H) 80.0 - 100.0 fL   MCH 31.7 26.0 - 34.0 pg   MCHC 28.2 (L) 30.0 - 36.0 g/dL   RDW 11.9 11.5 - 15.5 %   Platelets 141 (L) 150 - 400 K/uL   nRBC 0.0 0.0 - 0.2 %   Neutrophils Relative % 92 %   Neutro Abs 10.7 (H) 1.7 - 7.7 K/uL   Lymphocytes Relative 2 %   Lymphs Abs 0.3 (L) 0.7 - 4.0 K/uL   Monocytes Relative 5 %   Monocytes Absolute 0.6 0.1 - 1.0 K/uL   Eosinophils Relative 0 %   Eosinophils Absolute 0.0 0.0 - 0.5 K/uL   Basophils Relative 0 %   Basophils Absolute 0.0 0.0 - 0.1 K/uL   Immature Granulocytes 1 %   Abs Immature  Granulocytes 0.08 (H) 0.00 - 0.07 K/uL    Comment: Performed at [facility], [address] [place], [place] [address]  D-dimer, quantitative  Status: Abnormal   Collection Time: 03/25/22  3:48 PM  Result Value Ref Range   D-Dimer, Quant 7.32 (H) 0.00 - 0.50 ug/mL-FEU    Comment: (NOTE) At the manufacturer cut-off value of 0.5 g/mL FEU, this assay has a negative predictive value of 95-100%.This assay is intended for use in conjunction with a clinical pretest probability (PTP) assessment model to exclude pulmonary embolism (PE) and deep venous thrombosis (DVT) in outpatients suspected of PE or DVT. Results should be correlated with clinical presentation. Performed at [facility], [address] [place], [place] [address]   Magnesium     Status: None   Collection Time: 03/25/22  3:48 PM  Result Value Ref Range   Magnesium 2.0 1.7 - 2.4 mg/dL    Comment: Performed at [facility], [address], [place], [place] [address]  Troponin I (High Sensitivity)     Status: Abnormal   Collection Time: 03/25/22  3:48 PM  Result Value Ref Range   Troponin I (High Sensitivity) 77 (H) <18 ng/L    Comment: (NOTE) Elevated high sensitivity troponin I (hsTnI) values and significant  changes across serial measurements may suggest ACS but many other  chronic and acute conditions are known to elevate hsTnI results.  Refer to the "Links" section for chest pain algorithms and additional  guidance. Performed at [facility], [address] [place], [place] [address]   Resp panel by RT-PCR (RSV, Flu A&B, Covid) Anterior Nasal Swab     Status: None   Collection Time: 03/25/22  4:22 PM   Specimen: Anterior Nasal Swab  Result Value Ref Range   SARS Coronavirus 2 by RT PCR NEGATIVE NEGATIVE    Comment: (NOTE) SARS-CoV-2 target nucleic acids are NOT DETECTED.  The SARS-CoV-2 RNA is generally detectable in upper respiratory specimens during the acute phase of infection. The lowest concentration of  SARS-CoV-2 viral copies this assay can detect is 138 copies/mL. A negative result does not preclude SARS-Cov-2 infection and should not be used as the sole basis for treatment or other patient management decisions. A negative result may occur with  improper specimen collection/handling, submission of specimen other than nasopharyngeal swab, presence of viral mutation(s) within the areas targeted by this assay, and inadequate number of viral copies(<138 copies/mL). A negative result must be combined with clinical observations, patient history, and epidemiological information. The expected result is Negative.  Fact Sheet for Patients:  EntrepreneurPulse.com.au  Fact Sheet for Healthcare Providers:  IncredibleEmployment.be  This test is no t yet approved or cleared by the [place] FDA and  has been authorized for detection and/or diagnosis of SARS-CoV-2 by FDA under an Emergency Use Authorization (EUA). This EUA will remain  in effect (meaning this test can be used) for the duration of the COVID-19 declaration under Section 564(b)(1) of the Act, 21 U.S.C.section 360bbb-3(b)(1), unless the authorization is terminated  or revoked sooner.       Influenza A by PCR NEGATIVE NEGATIVE   Influenza B by PCR NEGATIVE NEGATIVE    Comment: (NOTE) The Xpert Xpress SARS-CoV-2/FLU/RSV plus assay is intended as an aid in the diagnosis of influenza from Nasopharyngeal swab specimens and should not be used as a sole basis for treatment. Nasal washings and aspirates are unacceptable for Xpert Xpress SARS-CoV-2/FLU/RSV testing.  Fact Sheet for Patients: EntrepreneurPulse.com.au  Fact Sheet for Healthcare Providers: IncredibleEmployment.be  This test is not yet approved or cleared by the [place] FDA and has been authorized for detection and/or diagnosis of SARS-CoV-2 by  FDA under an Emergency Use Authorization (EUA).  This EUA will remain in effect (meaning this test can be used) for the duration of the COVID-19 declaration under Section 564(b)(1) of the Act, 21 U.S.C. section 360bbb-3(b)(1), unless the authorization is terminated or revoked.     Resp Syncytial Virus by PCR NEGATIVE NEGATIVE    Comment: (NOTE) Fact Sheet for Patients: EntrepreneurPulse.com.au  Fact Sheet for Healthcare Providers: IncredibleEmployment.be  This test is not yet approved or cleared by the [place] FDA and has been authorized for detection and/or diagnosis of SARS-CoV-2 by FDA under an Emergency Use Authorization (EUA). This EUA will remain in effect (meaning this test can be used) for the duration of the COVID-19 declaration under Section 564(b)(1) of the Act, 21 U.S.C. section 360bbb-3(b)(1), unless the authorization is terminated or revoked.  Performed at [facility], [address], [place], [place] [address]   Acetaminophen level     Status: Abnormal   Collection Time: 03/25/22  4:53 PM  Result Value Ref Range   Acetaminophen (Tylenol), Serum <10 (L) 10 - 30 ug/mL    Comment: (NOTE) Therapeutic concentrations vary significantly. A range of 10-30 ug/mL  may be an effective concentration for many patients. However, some  are best treated at concentrations outside of this range. Acetaminophen concentrations >150 ug/mL at 4 hours after ingestion  and >50 ug/mL at 12 hours after ingestion are often associated with  toxic reactions.  Performed at [facility], [address] [place], [place] [address]   Troponin I (High Sensitivity)     Status: Abnormal   Collection Time: 03/25/22  4:53 PM  Result Value Ref Range   Troponin I (High Sensitivity) 73 (H) <18 ng/L    Comment: (NOTE) Elevated high sensitivity troponin I (hsTnI) values and significant  changes across serial measurements may suggest ACS but many other  chronic and acute conditions are known to elevate hsTnI  results.  Refer to the "Links" section for chest pain algorithms and additional  guidance. Performed at [facility], [address], [place], [place] [address]   Lactic acid, plasma     Status: None   Collection Time: 03/25/22  8:05 PM  Result Value Ref Range   Lactic Acid, Venous 1.3 0.5 - 1.9 mmol/L    Comment: Performed at [facility], [address], [place], [place] [address]  Blood Culture (routine x 2)     Status: None (Preliminary result)   Collection Time: 03/25/22  8:05 PM   Specimen: BLOOD  Result Value Ref Range   Specimen Description BLOOD BLOOD RIGHT ARM    Special Requests      BOTTLES DRAWN AEROBIC AND ANAEROBIC Blood Culture adequate volume   Culture      NO GROWTH < 12 HOURS Performed at [facility], [address] [place], [place] [address]    Report Status PENDING   Blood Culture (routine x 2)     Status: None (Preliminary result)   Collection Time: 03/25/22  8:13 PM   Specimen: BLOOD  Result Value Ref Range   Specimen Description BLOOD BLOOD RIGHT WRIST    Special Requests      BOTTLES DRAWN AEROBIC AND ANAEROBIC Blood Culture adequate volume   Culture      NO GROWTH < 12 HOURS Performed at [facility], [address], [place], [place] [address]    Report Status PENDING   Urinalysis, Routine w reflex microscopic -Urine, Clean Catch     Status: Abnormal   Collection  Time: 03/26/22  5:30 AM  Result Value Ref Range   Color, Urine YELLOW YELLOW   APPearance CLEAR CLEAR   Specific Gravity, Urine >1.046 (H) 1.005 - 1.030   pH 5.0 5.0 - 8.0   Glucose, UA NEGATIVE NEGATIVE mg/dL   Hgb urine dipstick NEGATIVE NEGATIVE   Bilirubin Urine NEGATIVE NEGATIVE   Ketones, ur 5 (A) NEGATIVE mg/dL   Protein, ur 30 (A) NEGATIVE mg/dL   Nitrite NEGATIVE NEGATIVE   Leukocytes,Ua NEGATIVE NEGATIVE   RBC / HPF 0-5 0 - 5 RBC/hpf   WBC, UA 0-5 0 - 5 WBC/hpf   Bacteria, UA RARE (A) NONE SEEN   Squamous Epithelial / HPF 0-5 0 - 5 /HPF   Mucus PRESENT     Comment:  Performed at [facility], [address], [place], [place] [address]  Magnesium     Status: Abnormal   Collection Time: 03/26/22  6:06 AM  Result Value Ref Range   Magnesium 1.6 (L) 1.7 - 2.4 mg/dL    Comment: Performed at [facility], [address], [place], [place] [address]  Phosphorus     Status: None   Collection Time: 03/26/22  6:06 AM  Result Value Ref Range   Phosphorus 2.9 2.5 - 4.6 mg/dL    Comment: Performed at [facility], [address] [place], [place] [address]    US Abdomen Limited RUQ (LIVER/GB)  Result Date: 03/26/2022 CLINICAL DATA:  Pain.  Elevated liver function tests EXAM: ULTRASOUND ABDOMEN LIMITED RIGHT UPPER QUADRANT COMPARISON:  CT 03/25/2022.  Ultrasound 06/02/2017 FINDINGS: Gallbladder: Once again there is echogenic shadowing along the margin of the gallbladder consistent with history of gallbladder wall calcification or porcelain gallbladder better depicted on the CT scan from 03/25/2022. This limits evaluation for luminal stones. No adjacent fluid. The gallbladder wall is measured at 5 mm, thickened. Common bile duct: Diameter: 3 mm Liver: No focal lesion identified. Within normal limits in parenchymal echogenicity. Portal vein is patent on color Doppler imaging with normal direction of blood flow towards the liver. Other: None. IMPRESSION: Diffuse gallbladder wall calcifications and shadowing consistent with the history of porcelain gallbladder. Evaluation for stones is limited. There is gallbladder wall thickening but no adjacent fluid Electronically Signed   By: Jill Side M.D.   On: 03/26/2022 09:56   CT ABDOMEN PELVIS W CONTRAST  Result Date: 03/25/2022 CLINICAL DATA:  Fall, abdominal pain. EXAM: CT ABDOMEN AND PELVIS WITH CONTRAST TECHNIQUE: Multidetector CT imaging of the abdomen and pelvis was performed using the standard protocol following bolus administration of intravenous contrast. RADIATION DOSE REDUCTION: This exam was performed according to the  departmental dose-optimization program which includes automated exposure control, adjustment of the mA and/or kV according to patient size and/or use of iterative reconstruction technique. CONTRAST:  187mL OMNIPAQUE IOHEXOL 350 MG/ML SOLN COMPARISON:  06/25/2017 FINDINGS: Lower chest: See CTA chest report today Hepatobiliary: Gallbladder wall calcifications compatible with a porcelain gallbladder. This is unchanged since prior study. No visible stones. Small hypodensity in the right hepatic lobe is most compatible with small cyst. Pancreas: No focal abnormality or ductal dilatation. Spleen: No focal abnormality.  Normal size. Adrenals/Urinary Tract: No adrenal abnormality. No focal renal abnormality. No stones or hydronephrosis. Urinary bladder is unremarkable. Stomach/Bowel: Large stool burden throughout the colon. Stomach, large and small bowel grossly unremarkable. Vascular/Lymphatic: Scattered aortic calcifications. No evidence of aneurysm or adenopathy. Reproductive: Uterus and adnexa unremarkable.  No mass. Other: No free fluid or free air. Musculoskeletal: There appears to be  a sacral decubitus ulcer with gas in the soft tissues overlying the sacrum extending to the skin surface. No bony changes of osteomyelitis. No acute bony abnormality. 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 Large stool burden in the colon. Scattered aortic atherosclerosis. Soft tissue gas in the soft tissues overlying the sacrum most compatible with sacral decubitus ulcer. No underlying changes of osteomyelitis. Electronically Signed   By: Rolm Baptise M.D.   On: 03/25/2022 19:14   CT Angio Chest PE W and/or Wo Contrast  Result Date: 03/25/2022 CLINICAL DATA:  Pulmonary embolism (PE) suspected, low to intermediate prob, positive D-dimer Pulmonary embolism (PE) suspected, high prob. Fall. Shortness of breath. EXAM: CT ANGIOGRAPHY CHEST WITH CONTRAST TECHNIQUE: Multidetector CT imaging of the chest was  performed using the standard protocol during bolus administration of intravenous contrast. Multiplanar CT image reconstructions and MIPs were obtained to evaluate the vascular anatomy. RADIATION DOSE REDUCTION: This exam was performed according to the departmental dose-optimization program which includes automated exposure control, adjustment of the mA and/or kV according to patient size and/or use of iterative reconstruction technique. CONTRAST:  19mL OMNIPAQUE IOHEXOL 350 MG/ML SOLN COMPARISON:  01/12/2022.  PET CT 02/05/2022. FINDINGS: Cardiovascular: No filling defects in the pulmonary arteries to suggest pulmonary emboli. Heart is normal size. Aorta is normal caliber. Scattered aortic calcifications. Mediastinum/Nodes: No mediastinal, hilar, or axillary adenopathy. Trachea and esophagus are unremarkable. Thyroid unremarkable. Lungs/Pleura: Moderate to advanced emphysema. Bilobed right lower lobe pulmonary nodule measures 1.9 x 1.4 cm. This is increased in size, previously measuring 1.4 x 1.3 cm. This is concerning for malignancy. Areas of scarring in the left lower lobe. No effusions. Upper Abdomen: No acute findings Musculoskeletal: Chest wall soft tissues are unremarkable. No acute bony abnormality. Review of the MIP images confirms the above findings. IMPRESSION: No evidence of pulmonary embolus. 1.9 x 1.4 cm bilobed right lower lobe pulmonary nodule, increasing in size since prior study. This is concerning for malignancy. This has been previously evaluated with PET CT. Aortic Atherosclerosis (ICD10-I70.0) and Emphysema (ICD10-J43.9). Electronically Signed   By: Rolm Baptise M.D.   On: 03/25/2022 19:11   DG Chest Port 1 View  Result Date: 03/25/2022 CLINICAL DATA:  Shortness of breath EXAM: PORTABLE CHEST 1 VIEW COMPARISON:  02/09/2022 FINDINGS: Unchanged cardiac and mediastinal contours. Redemonstrated hyperinflation, compatible with COPD. Redemonstrated right lower lung nodule, better evaluated on the  11/26/2021 CTA and 02/05/2022 PET-CT. No new focal pulmonary opacity. No pleural effusion or pneumothorax. No acute osseous abnormality. IMPRESSION: 1. No acute cardiopulmonary process. 2. Redemonstrated right lower lung nodule, better evaluated on the 11/26/2021 CTA and 02/05/2022 PET-CT. Electronically Signed   By: Merilyn Baba M.D.   On: 03/25/2022 16:20    ROS:  Pertinent items are noted in HPI.  Blood pressure 104/61, pulse 74, temperature 98.4 F (36.9 C), temperature source Axillary, resp. rate 19, height 5\' 3"  (1.6 m), weight 45.4 kg, last menstrual period 05/06/2010, SpO2 98 %. Physical Exam: Pleasant white female, CPAP mask. Patient seen at bedside in room 4 of the emergency room. Skin: A 5 cm hard eschar is noted centrally over the sacrum.  Minimal erythema surrounding the eschar.  I debrided the eschar at bedside using scissors.  Full-thickness skin was excised.  No abscess was noted in the subcutaneous tissue.  Bone was not immediately exposed.  A dressing was reapplied.  Patient tolerated the procedure well.    Media Information  Document Information  Photos    03/26/2022 14:12  Attached To:  Hospital Encounter on 03/25/22  Source Information  Aviva Signs, MD  [facility]    Assessment/Plan: Impression: Stage III decubitus ulcer, most likely secondary to pressure ulceration.  No deep abscess found. Plan: Would continue local wound care with normal saline wet-to-dry dressings.  Right amend skin care precautions including frequent turning, maximizing protein and nutritional intake.  Will follow peripherally with you.  Aviva Signs 03/26/2022, 2:52 PM

## 2022-03-26 NOTE — TOC Progression Note (Signed)
Transition of Care [facility]) - Progression Note    Patient Details  Name: Donna Watkins MRN: [id number] Date of Birth: Feb 09, 1960  Transition of Care [facility] CM/SW Contact  Boneta Lucks, RN Phone Number: 03/26/2022, 3:28 PM  Clinical Narrative:   PT is recommending HHPT. Patient on Bipap unable to reach sister.  Surgery consulted. Per MD will go home with sister, possibly Saturday. TOC follow for home health PT and possibly will need RN.   Expected Discharge Plan: [place] Barriers to Discharge: Continued Medical Work up  Expected Discharge Plan and Services      Living arrangements for the past 2 months: Single Family Home         Social Determinants of Health (SDOH) Interventions SDOH Screenings   Food Insecurity: No Food Insecurity (02/05/2022)  Housing: Low Risk  (02/05/2022)  Transportation Needs: No Transportation Needs (02/05/2022)  Utilities: Not At Risk (02/05/2022)  Alcohol Screen: Low Risk  (07/30/2021)  Depression (PHQ2-9): Low Risk  (08/12/2020)  Financial Resource Strain: Low Risk  (07/30/2021)  Physical Activity: Sufficiently Active (07/30/2021)  Social Connections: Socially Isolated (07/30/2021)  Stress: No Stress Concern Present (07/30/2021)  Tobacco Use: Medium Risk (11/26/2021)    Readmission Risk Interventions    02/06/2022    2:58 PM  Readmission Risk Prevention Plan  Transportation Screening Complete  Home Care Screening Complete  Medication Review (RN CM) Complete

## 2022-03-26 NOTE — Consult Note (Addendum)
[place] Nurse Consult Note: Reason for Consult: Consult requested for sacrum wound.  Performed remotely after review of progress notes and photos in the EMR.  Pt has an Unstageable pressure injury to the sacrum, 90% eschar, 10% red with strong foul odor and mod amt tan drainage.  Pressure Injury POA: Yes Dressing procedure/placement/frequency: CT scan does not show osteomyelitis, but indicates, "There appears to be a sacral decubitus ulcer with gas in the soft tissues overlying the sacrum extending to the skin surface.  Recommend surgical consult for possible bedside debridement.  Secure chat message sent to primary team to request.  Air mattress ordered to reduce pressure. Topical treatment orders provided for bedside nurses to perform as follows to assist with removal of nonviable tissue: Apply Medihoney to sacrum wound Q day, then cover with foam dressing.  (Change foam dressing Q 3 days or PRN soiling. Please re-consult if further assistance is needed.  Thank-you,  Julien Girt MSN, [place], [place], [place], [place]

## 2022-03-27 DIAGNOSIS — E440 Moderate protein-calorie malnutrition: Secondary | ICD-10-CM | POA: Insufficient documentation

## 2022-03-27 DIAGNOSIS — J9622 Acute and chronic respiratory failure with hypercapnia: Secondary | ICD-10-CM | POA: Diagnosis not present

## 2022-03-27 DIAGNOSIS — J441 Chronic obstructive pulmonary disease with (acute) exacerbation: Secondary | ICD-10-CM | POA: Diagnosis not present

## 2022-03-27 DIAGNOSIS — J9621 Acute and chronic respiratory failure with hypoxia: Secondary | ICD-10-CM | POA: Diagnosis not present

## 2022-03-27 LAB — COMPREHENSIVE METABOLIC PANEL
ALT: 307 U/L — ABNORMAL HIGH (ref 0–44)
AST: 47 U/L — ABNORMAL HIGH (ref 15–41)
Albumin: 2.3 g/dL — ABNORMAL LOW (ref 3.5–5.0)
Alkaline Phosphatase: 71 U/L (ref 38–126)
BUN: 15 mg/dL (ref 6–20)
CO2: >45 mmol/L — ABNORMAL HIGH (ref 22–32)
Calcium: 8.2 mg/dL — ABNORMAL LOW (ref 8.9–10.3)
Chloride: 84 mmol/L — ABNORMAL LOW (ref 98–111)
Creatinine, Ser: <0.3 mg/dL — ABNORMAL LOW (ref 0.44–1.00)
Glucose, Bld: 102 mg/dL — ABNORMAL HIGH (ref 70–99)
Potassium: 3.5 mmol/L (ref 3.5–5.1)
Sodium: 140 mmol/L (ref 135–145)
Total Bilirubin: 0.4 mg/dL (ref 0.3–1.2)
Total Protein: 4.9 g/dL — ABNORMAL LOW (ref 6.5–8.1)

## 2022-03-27 LAB — PHOSPHORUS: Phosphorus: 1.9 mg/dL — ABNORMAL LOW (ref 2.5–4.6)

## 2022-03-27 LAB — CBC
HCT: 35.5 % — ABNORMAL LOW (ref 36.0–46.0)
Hemoglobin: 10.4 g/dL — ABNORMAL LOW (ref 12.0–15.0)
MCH: 31.2 pg (ref 26.0–34.0)
MCHC: 29.3 g/dL — ABNORMAL LOW (ref 30.0–36.0)
MCV: 106.6 fL — ABNORMAL HIGH (ref 80.0–100.0)
Platelets: 148 10*3/uL — ABNORMAL LOW (ref 150–400)
RBC: 3.33 MIL/uL — ABNORMAL LOW (ref 3.87–5.11)
RDW: 11.9 % (ref 11.5–15.5)
WBC: 11.3 10*3/uL — ABNORMAL HIGH (ref 4.0–10.5)
nRBC: 0.2 % (ref 0.0–0.2)

## 2022-03-27 LAB — MRSA NEXT GEN BY PCR, NASAL: MRSA by PCR Next Gen: NOT DETECTED

## 2022-03-27 LAB — MAGNESIUM: Magnesium: 1.8 mg/dL (ref 1.7–2.4)

## 2022-03-27 MED ORDER — JUVEN PO PACK
1.0000 | PACK | Freq: Two times a day (BID) | ORAL | Status: DC
  Administered 2022-03-27 – 2022-03-28 (×3): 1 via ORAL
  Filled 2022-03-27 (×3): qty 1

## 2022-03-27 MED ORDER — ADULT MULTIVITAMIN W/MINERALS CH
1.0000 | ORAL_TABLET | Freq: Every day | ORAL | Status: DC
  Administered 2022-03-27 – 2022-03-28 (×2): 1 via ORAL
  Filled 2022-03-27 (×2): qty 1

## 2022-03-27 MED ORDER — MAGNESIUM SULFATE 2 GM/50ML IV SOLN
2.0000 g | Freq: Once | INTRAVENOUS | Status: AC
  Administered 2022-03-27: 2 g via INTRAVENOUS
  Filled 2022-03-27: qty 50

## 2022-03-27 MED ORDER — PROSOURCE PLUS PO LIQD
30.0000 mL | Freq: Three times a day (TID) | ORAL | Status: DC
  Administered 2022-03-27 – 2022-03-28 (×4): 30 mL via ORAL
  Filled 2022-03-27 (×4): qty 30

## 2022-03-27 MED ORDER — K PHOS MONO-SOD PHOS DI & MONO 155-852-130 MG PO TABS
500.0000 mg | ORAL_TABLET | Freq: Three times a day (TID) | ORAL | Status: DC
  Administered 2022-03-27 – 2022-03-28 (×2): 500 mg via ORAL
  Filled 2022-03-27 (×2): qty 2

## 2022-03-27 NOTE — Progress Notes (Signed)
Initial Nutrition Assessment  DOCUMENTATION CODES:   Non-severe (moderate) malnutrition in context of chronic illness  INTERVENTION:  -1 packet Juven BID, to support wound healing   -ProSource Plus 30 ml TID  -Multivitamin daily   NUTRITION DIAGNOSIS:   Moderate Malnutrition related to chronic illness (COPD (chronic O2@3L )) as evidenced by mild fat depletion, moderate muscle depletion, mild muscle depletion.   GOAL:  Provide needs based on ASPEN/SCCM guidelines   MONITOR:  PO intake, Supplement acceptance, Weight trends, Labs  REASON FOR ASSESSMENT:   Consult Assessment of nutrition requirement/status  ASSESSMENT: Patient is an underweight 62 yo female with hx of COPD, chronic hypoxia (O2@3L ), lung cancer, GERD, malnutrition, anemia and depression. Presents with shortness of breath and fall.  Patient receiving breathing treatment. No family present. Her breakfast is here but untouched. Patient has fair appetite. Able to feed herself. No complaint of chewing or swallow problem. Discussed with patient importance of protein/energy intake to promote wound healing. She is agreeable to supplement nutrition while in hospital.   Patient has sedentary lifestyle per chart. Her weight has decreased 7% since 02/07/22- which is trending toward significant. Currently she weighs 45.7 kg (99 lb).  Medications reviewed.      Latest Ref Rng & Units 03/27/2022    4:11 AM 03/25/2022    3:48 PM 02/10/2022    7:57 AM  BMP  Glucose 70 - 99 mg/dL 102  117  95   BUN 6 - 20 mg/dL 15  18  17    Creatinine 0.44 - 1.00 mg/dL <0.30  0.44  0.33   Sodium 135 - 145 mmol/L 140  142  142   Potassium 3.5 - 5.1 mmol/L 3.5  3.7  4.1   Chloride 98 - 111 mmol/L 84  79  89   CO2 22 - 32 mmol/L >45  >45  >45   Calcium 8.9 - 10.3 mg/dL 8.2  9.0  8.7       NUTRITION - FOCUSED PHYSICAL EXAM:  Flowsheet Row Most Recent Value  Orbital Region Mild depletion  Upper Arm Region Mild depletion  Thoracic and Lumbar  Region No depletion  Buccal Region Unable to assess  Temple Region Mild depletion  Clavicle Bone Region Mild depletion  Clavicle and Acromion Bone Region Moderate depletion  Scapular Bone Region Mild depletion  Dorsal Hand Mild depletion  Patellar Region Severe depletion  Anterior Thigh Region Moderate depletion  Edema (RD Assessment) Moderate  Hair Reviewed  Eyes Reviewed  Skin Reviewed  Nails Reviewed       Diet Order:   Diet Order             Diet Heart Room service appropriate? Yes; Fluid consistency: Thin  Diet effective now                   EDUCATION NEEDS:  Education needs have been addressed  Skin:  Skin Assessment: Skin Integrity Issues: Skin Integrity Issues:: Unstageable Unstageable: sacrum  Last BM:  PTA  Height:   Ht Readings from Last 1 Encounters:  03/26/22 5\' 3"  (1.6 m)    Weight:   Wt Readings from Last 1 Encounters:  03/27/22 45.7 kg    Ideal Body Weight:   52 kg  BMI:  Body mass index is 17.85 kg/m.  Estimated Nutritional Needs:   Kcal:  1500-1700  Protein:  70-75 gr  Fluid:  1500 ml daily   Colman Cater MS,RD,CSG,LDN Contact: Shea Evans

## 2022-03-27 NOTE — Progress Notes (Signed)
PROGRESS NOTE  Donna Watkins [id number] DOB: 09-Jan-1960 DOA: 03/25/2022 PCP: Lemmie Evens, MD  Brief History:  62 year old female with a history of COPD, chronic respiratory failure on 3 L, lung cancer status post XRT, anxiety, hyperlipidemia presenting from home secondary to hypoxia and tachycardia.  The patient states that she had been in her usual state of health until 03/25/2022 when she was working with home health physical therapy.  Apparently, home health PT noted the patient to be hypoxic with ambulation into the 85% on her usual 3 L.  The patient apparently was tachycardic into the 100s.  As result, EMS was activated.  Upon EMS arrival, she was noted to have oxygen saturation in the high 80s.  Show her oxygen was increased to 4 L. The patient herself states that she had been in her usual state of health.  She had denied any fevers, chills, chest pain, worsening cough, hemoptysis, nausea, vomiting, diarrhea, abdominal pain.  She states that her breathing was as same as usual.  She is rather sedentary and sits on the couch most of the day.  She needs assistance with her ADLs.  In the ED, the patient was afebrile and hemodynamically stable.  She was placed on BiPAP.  WBC 11.6, hemoglobin 12.9, platelets 141,000.  Sodium 142, potassium 3.7, bicarbonate >45.  Serum creatinine 0.44.  AST 211, ALT 670, alkaline phosphatase 97, total bilirubin 1.0.  CTA chest was negative for PE.  Showed a 1.9 x 1.4 cmright lower lobe nodule.  The patient was given DuoNebs and IV Solu-Medrol.  She was admitted for acute on chronic respiratory failure.   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 -general surgery consult   Unstageable sacral decubitus ulcer -Wound care consult -There is some mild cellulitis surrounding the wound -appreciate surgery>>debrided   Anxiety -Continue home dose alprazolam -PDMP reviewed -Patient received Xanax 1 mg, #150 monthly, last refill 02/19/2022   Hypomagnesemia/hypophosphatemia -Repleted   Mixed hyperlipidemia -Holding statin secondary to elevated LFTs   GERD -Continue PPI   Thrombocytopenia -Appears to be chronic -monitor for signs of bleeding             Family Communication:  no  Family at bedside   Consultants:  general surgery   Code Status:  FULL    DVT Prophylaxis:   [place] Lovenox     Procedures: As Listed in Progress Note Above   Antibiotics: None               Subjective: Pt is breathing better.  Denies f/c, cp, n/v/d, abdominal pain  Objective: Vitals:   03/27/22 1450 03/27/22 1500 03/27/22 1600 03/27/22 1700  BP:  (!) 106/57 (!) 104/48 (!) 93/53  Pulse:  94 90 88  Resp:  (!) 24 (!) 25 (!) 27  Temp:      TempSrc:      SpO2: 97% 99% 100% 98%  Weight:      Height:        Intake/Output Summary (Last 24 hours)  at 03/27/2022 1812 Last data filed at 03/27/2022 0329 Gross per 24 hour  Intake 150 ml  Output 300 ml  Net -150 ml   Weight change: 0.34 kg Exam:  General:  Pt is alert, follows commands appropriately, not in acute distress HEENT: No icterus, No thrush, No neck mass, [place]/AT Cardiovascular: RRR, S1/S2, no rubs, no gallops Respiratory: diminished BS.  Bibasilar rales. Abdomen: Soft/+BS, non tender, non distended, no guarding Extremities: No edema, No lymphangitis, No petechiae, No rashes, no synovitis   Data  Reviewed: I have personally reviewed following labs and imaging studies Basic Metabolic Panel: Recent Labs  Lab 03/25/22 1548 03/26/22 0606 03/27/22 0411  NA 142  --  140  K 3.7  --  3.5  CL 79*  --  84*  CO2 >45*  --  >45*  GLUCOSE 117*  --  102*  BUN 18  --  15  CREATININE 0.44  --  <0.30*  CALCIUM 9.0  --  8.2*  MG 2.0 1.6* 1.8  PHOS  --  2.9 1.9*   Liver Function Tests: Recent Labs  Lab 03/25/22 1548 03/27/22 0411  AST 211* 47*  ALT 670* 307*  ALKPHOS 97 71  BILITOT 1.0 0.4  PROT 6.5 4.9*  ALBUMIN 3.2* 2.3*   No results for input(s): "LIPASE", "AMYLASE" in the last 168 hours. No results for input(s): "AMMONIA" in the last 168 hours. Coagulation Profile: No results for input(s): "INR", "PROTIME" in the last 168 hours. CBC: Recent Labs  Lab 03/25/22 1548 03/27/22 0411  WBC 11.6* 11.3*  NEUTROABS 10.7*  --   HGB 12.9 10.4*  HCT 45.7 35.5*  MCV 112.3* 106.6*  PLT 141* 148*   Cardiac Enzymes: No results for input(s): "CKTOTAL", "CKMB", "CKMBINDEX", "TROPONINI" in the last 168 hours. BNP: Invalid input(s): "POCBNP" CBG: No results for input(s): "GLUCAP" in the last 168 hours. HbA1C: No results for input(s): "HGBA1C" in the last 72 hours. Urine analysis:    Component Value Date/Time   COLORURINE YELLOW 03/26/2022 0530   APPEARANCEUR CLEAR 03/26/2022 0530   LABSPEC >1.046 (H) 03/26/2022 0530   PHURINE 5.0 03/26/2022 0530   GLUCOSEU NEGATIVE 03/26/2022 0530   HGBUR NEGATIVE 03/26/2022 0530   BILIRUBINUR NEGATIVE 03/26/2022 0530   KETONESUR 5 (A) 03/26/2022 0530   PROTEINUR 30 (A) 03/26/2022 0530   UROBILINOGEN 1.0 06/13/2011 0148   NITRITE NEGATIVE 03/26/2022 0530   LEUKOCYTESUR NEGATIVE 03/26/2022 0530   Sepsis Labs: @LABRCNTIP (procalcitonin:4,lacticidven:4) ) Recent Results (from the past 240 hour(s))  Resp panel by RT-PCR (RSV, Flu A&B, Covid) Anterior Nasal Swab     Status: None   Collection Time: 03/25/22  4:22 PM   Specimen: Anterior Nasal  Swab  Result Value Ref Range Status   SARS Coronavirus 2 by RT PCR NEGATIVE NEGATIVE Final    Comment: (NOTE) SARS-CoV-2 target nucleic acids are NOT DETECTED.  The SARS-CoV-2 RNA is generally detectable in upper respiratory specimens during the acute phase of infection. The lowest concentration of SARS-CoV-2 viral copies this assay can detect is 138 copies/mL. A negative result does not preclude SARS-Cov-2 infection and should not be used as the sole basis for treatment or other patient management decisions. A negative result may occur with  improper specimen collection/handling, submission of specimen other than nasopharyngeal swab, presence of viral mutation(s) within the areas targeted by this assay, and inadequate number of viral copies(<138 copies/mL). A negative result must be combined with clinical observations, patient history, and epidemiological information. The expected result is Negative.  Fact Sheet for Patients:  EntrepreneurPulse.com.au  Fact Sheet for Healthcare Providers:  IncredibleEmployment.be  This test is no t yet approved or cleared by the [place] FDA and  has been authorized for detection and/or diagnosis of SARS-CoV-2 by FDA under an Emergency Use Authorization (EUA). This EUA will remain  in effect (meaning this test can be used) for the duration of the COVID-19 declaration under Section 564(b)(1) of the Act, 21 U.S.C.section 360bbb-3(b)(1), unless the authorization is terminated  or revoked sooner.       Influenza A by PCR NEGATIVE NEGATIVE Final   Influenza B by PCR NEGATIVE NEGATIVE Final    Comment: (NOTE) The Xpert Xpress SARS-CoV-2/FLU/RSV plus assay is intended as an aid in the diagnosis of influenza from Nasopharyngeal swab specimens and should not be used as a sole basis for treatment. Nasal washings and aspirates are unacceptable for Xpert Xpress SARS-CoV-2/FLU/RSV testing.  Fact Sheet for  Patients: EntrepreneurPulse.com.au  Fact Sheet for Healthcare Providers: IncredibleEmployment.be  This test is not yet approved or cleared by the [place] FDA and has been authorized for detection and/or diagnosis of SARS-CoV-2 by FDA under an Emergency Use Authorization (EUA). This EUA will remain in effect (meaning this test can be used) for the duration of the COVID-19 declaration under Section 564(b)(1) of the Act, 21 U.S.C. section 360bbb-3(b)(1), unless the authorization is terminated or revoked.     Resp Syncytial Virus by PCR NEGATIVE NEGATIVE Final    Comment: (NOTE) Fact Sheet for Patients: EntrepreneurPulse.com.au  Fact Sheet for Healthcare Providers: IncredibleEmployment.be  This test is not yet approved or cleared by the [place] FDA and has been authorized for detection and/or diagnosis of SARS-CoV-2 by FDA under an Emergency Use Authorization (EUA). This EUA will remain in effect (meaning this test can be used) for the duration of the COVID-19 declaration under Section 564(b)(1) of the Act, 21 U.S.C. section 360bbb-3(b)(1), unless the authorization is terminated or revoked.  Performed at [facility], [address], [place], [place] [address]   Blood Culture (routine x 2)     Status: None (Preliminary result)   Collection Time: 03/25/22  8:05 PM   Specimen: BLOOD  Result Value Ref Range Status   Specimen Description BLOOD BLOOD RIGHT ARM  Final   Special Requests   Final    BOTTLES DRAWN AEROBIC AND ANAEROBIC Blood Culture adequate volume   Culture   Final    NO GROWTH 2 DAYS Performed at [facility], [address] [place], [place] [address]    Report Status PENDING  Incomplete  Blood Culture (routine x 2)     Status: None (Preliminary result)   Collection Time: 03/25/22  8:13 PM   Specimen: BLOOD  Result Value Ref Range Status   Specimen Description BLOOD BLOOD RIGHT  WRIST  Final   Special Requests   Final    BOTTLES DRAWN AEROBIC AND ANAEROBIC Blood Culture adequate volume   Culture   Final    NO GROWTH 2 DAYS Performed at [facility], [address], [place], [place] [address]    Report Status PENDING  Incomplete  MRSA Next Gen by PCR, Nasal     Status: None   Collection Time: 03/26/22  3:00 PM   Specimen: Nasal Mucosa; Nasal Swab  Result Value Ref Range Status   MRSA by PCR Next Gen NOT DETECTED NOT DETECTED Final    Comment: (NOTE) The GeneXpert MRSA Assay (FDA approved for NASAL specimens only), is one component of a  comprehensive MRSA colonization surveillance program. It is not intended to diagnose MRSA infection nor to guide or monitor treatment for MRSA infections. Test performance is not FDA approved in patients less than 16 years old. Performed at [facility], [address] [place], [place] [address]      Scheduled Meds:  (feeding supplement) PROSource Plus  30 mL Oral TID BM   albuterol  2.5 mg Nebulization Q6H   arformoterol  15 mcg Nebulization BID   budesonide (PULMICORT) nebulizer solution  0.5 mg Nebulization BID   Chlorhexidine Gluconate Cloth  6 each Topical Daily   enoxaparin (LOVENOX) injection  30 mg Subcutaneous Q24H   feeding supplement  237 mL Oral BID BM   leptospermum manuka honey  1 Application Topical Daily   methylPREDNISolone (SOLU-MEDROL) injection  125 mg Intravenous Q24H   multivitamin with minerals  1 tablet Oral Daily   nutrition supplement (JUVEN)  1 packet Oral BID BM   pantoprazole (PROTONIX) IV  40 mg Intravenous Q24H   revefenacin  175 mcg Nebulization Daily   Continuous Infusions:  vancomycin Stopped (03/26/22 2104)    Procedures/Studies: US Abdomen Limited RUQ (LIVER/GB)  Result Date: 03/26/2022 CLINICAL DATA:  Pain.  Elevated liver function tests EXAM: ULTRASOUND ABDOMEN LIMITED RIGHT UPPER QUADRANT COMPARISON:  CT 03/25/2022.  Ultrasound 06/02/2017 FINDINGS: Gallbladder: Once again there  is echogenic shadowing along the margin of the gallbladder consistent with history of gallbladder wall calcification or porcelain gallbladder better depicted on the CT scan from 03/25/2022. This limits evaluation for luminal stones. No adjacent fluid. The gallbladder wall is measured at 5 mm, thickened. Common bile duct: Diameter: 3 mm Liver: No focal lesion identified. Within normal limits in parenchymal echogenicity. Portal vein is patent on color Doppler imaging with normal direction of blood flow towards the liver. Other: None. IMPRESSION: Diffuse gallbladder wall calcifications and shadowing consistent with the history of porcelain gallbladder. Evaluation for stones is limited. There is gallbladder wall thickening but no adjacent fluid Electronically Signed   By: Jill Side M.D.   On: 03/26/2022 09:56   CT ABDOMEN PELVIS W CONTRAST  Result Date: 03/25/2022 CLINICAL DATA:  Fall, abdominal pain. EXAM: CT ABDOMEN AND PELVIS WITH CONTRAST TECHNIQUE: Multidetector CT imaging of the abdomen and pelvis was performed using the standard protocol following bolus administration of intravenous contrast. RADIATION DOSE REDUCTION: This exam was performed according to the departmental dose-optimization program which includes automated exposure control, adjustment of the mA and/or kV according to patient size and/or use of iterative reconstruction technique. CONTRAST:  143mL OMNIPAQUE IOHEXOL 350 MG/ML SOLN COMPARISON:  06/25/2017 FINDINGS: Lower chest: See CTA chest report today Hepatobiliary: Gallbladder wall calcifications compatible with a porcelain gallbladder. This is unchanged since prior study. No visible stones. Small hypodensity in the right hepatic lobe is most compatible with small cyst. Pancreas: No focal abnormality or ductal dilatation. Spleen: No focal abnormality.  Normal size. Adrenals/Urinary Tract: No adrenal abnormality. No focal renal abnormality. No stones or hydronephrosis. Urinary bladder is  unremarkable. Stomach/Bowel: Large stool burden throughout the colon. Stomach, large and small bowel grossly unremarkable. Vascular/Lymphatic: Scattered aortic calcifications. No evidence of aneurysm or adenopathy. Reproductive: Uterus and adnexa unremarkable.  No mass. Other: No free fluid or free air. Musculoskeletal: There appears to be a sacral decubitus ulcer with gas in the soft tissues overlying the sacrum extending to the skin surface. No bony changes of osteomyelitis. No acute bony abnormality. 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 Large stool burden in  the colon. Scattered aortic atherosclerosis. Soft tissue gas in the soft tissues overlying the sacrum most compatible with sacral decubitus ulcer. No underlying changes of osteomyelitis. Electronically Signed   By: Rolm Baptise M.D.   On: 03/25/2022 19:14   CT Angio Chest PE W and/or Wo Contrast  Result Date: 03/25/2022 CLINICAL DATA:  Pulmonary embolism (PE) suspected, low to intermediate prob, positive D-dimer Pulmonary embolism (PE) suspected, high prob. Fall. Shortness of breath. EXAM: CT ANGIOGRAPHY CHEST WITH CONTRAST TECHNIQUE: Multidetector CT imaging of the chest was performed using the standard protocol during bolus administration of intravenous contrast. Multiplanar CT image reconstructions and MIPs were obtained to evaluate the vascular anatomy. RADIATION DOSE REDUCTION: This exam was performed according to the departmental dose-optimization program which includes automated exposure control, adjustment of the mA and/or kV according to patient size and/or use of iterative reconstruction technique. CONTRAST:  166mL OMNIPAQUE IOHEXOL 350 MG/ML SOLN COMPARISON:  01/12/2022.  PET CT 02/05/2022. FINDINGS: Cardiovascular: No filling defects in the pulmonary arteries to suggest pulmonary emboli. Heart is normal size. Aorta is normal caliber. Scattered aortic calcifications. Mediastinum/Nodes: No mediastinal, hilar,  or axillary adenopathy. Trachea and esophagus are unremarkable. Thyroid unremarkable. Lungs/Pleura: Moderate to advanced emphysema. Bilobed right lower lobe pulmonary nodule measures 1.9 x 1.4 cm. This is increased in size, previously measuring 1.4 x 1.3 cm. This is concerning for malignancy. Areas of scarring in the left lower lobe. No effusions. Upper Abdomen: No acute findings Musculoskeletal: Chest wall soft tissues are unremarkable. No acute bony abnormality. Review of the MIP images confirms the above findings. IMPRESSION: No evidence of pulmonary embolus. 1.9 x 1.4 cm bilobed right lower lobe pulmonary nodule, increasing in size since prior study. This is concerning for malignancy. This has been previously evaluated with PET CT. Aortic Atherosclerosis (ICD10-I70.0) and Emphysema (ICD10-J43.9). Electronically Signed   By: Rolm Baptise M.D.   On: 03/25/2022 19:11   DG Chest Port 1 View  Result Date: 03/25/2022 CLINICAL DATA:  Shortness of breath EXAM: PORTABLE CHEST 1 VIEW COMPARISON:  02/09/2022 FINDINGS: Unchanged cardiac and mediastinal contours. Redemonstrated hyperinflation, compatible with COPD. Redemonstrated right lower lung nodule, better evaluated on the 11/26/2021 CTA and 02/05/2022 PET-CT. No new focal pulmonary opacity. No pleural effusion or pneumothorax. No acute osseous abnormality. IMPRESSION: 1. No acute cardiopulmonary process. 2. Redemonstrated right lower lung nodule, better evaluated on the 11/26/2021 CTA and 02/05/2022 PET-CT. Electronically Signed   By: Merilyn Baba M.D.   On: 03/25/2022 16:20    Orson Eva, DO  [facility]  If 7PM-7AM, please contact night-coverage [email or web address] Password [email or web address] 03/27/2022, 6:12 PM   LOS: 1 day

## 2022-03-27 NOTE — TOC Progression Note (Signed)
Transition of Care [facility]) - Progression Note    Patient Details  Name: Donna Watkins MRN: [id number] Date of Birth: [birth year]  Transition of Care [facility] CM/SW [place], [place] Phone Number: 03/27/2022, 1:16 PM  Clinical Narrative:    CSW spoke with pts sister who states she and pt live together. Pt has [place] with [facility] at this time. TOC updated [place] with [facility] that pt is admitted and to confirm what services pt has, awaiting response. Pts sister states [facility] PT recommended a walker for pt. CSW reached out to PT here to see if walker order could be placed. CSW will reach out to [place] with [employer] to get walker ordered. Pt has 3L O2 supplied through [employer]. TOC to follow.   Expected Discharge Plan: [place] Barriers to Discharge: Continued Medical Work up  Expected Discharge Plan and Services       Living arrangements for the past 2 months: Single Family Home                                       Social Determinants of Health (SDOH) Interventions SDOH Screenings   Food Insecurity: No Food Insecurity (03/26/2022)  Housing: Low Risk  (03/26/2022)  Transportation Needs: No Transportation Needs (03/26/2022)  Utilities: Not At Risk (03/26/2022)  Alcohol Screen: Low Risk  (07/30/2021)  Depression (PHQ2-9): Low Risk  (08/12/2020)  Financial Resource Strain: Low Risk  (07/30/2021)  Physical Activity: Sufficiently Active (07/30/2021)  Social Connections: Socially Isolated (07/30/2021)  Stress: No Stress Concern Present (07/30/2021)  Tobacco Use: Medium Risk (03/26/2022)    Readmission Risk Interventions    03/27/2022    1:15 PM 02/06/2022    2:58 PM  Readmission Risk Prevention Plan  Transportation Screening Complete Complete  Home Care Screening  Complete  Medication Review (RN CM)  Complete  HRI or Home Care Consult Complete   Social Work Consult for [place] Planning/Counseling Complete   Palliative Care Screening Not Applicable    Medication Review Press photographer) Complete

## 2022-03-27 NOTE — Consult Note (Signed)
WOC had placed order for Medihoney on original consult 02/25/2022.  Surgeon has debrided necrotic tissue since that consult and ordered saline moist to dry dressing.  Medihoney order discontinued.  Defer to surgeon for any additional wound care orders or may reconsult WOC if needed.   Thank you,    Shelton Silvas MSN, RN-BC, [employer] [phone]

## 2022-03-27 NOTE — Progress Notes (Signed)
OT Cancellation Note  Patient Details Name: Donna Watkins MRN: [id number] DOB: 14-May-1960   Cancelled Treatment:     Attempted to see pt for OT treatment session, pt receiving breathing treatment at this time. Will check back as schedule allows.   Frederic Jericho, OTR/L 03/27/2022, 8:41 AM

## 2022-03-28 DIAGNOSIS — J9621 Acute and chronic respiratory failure with hypoxia: Secondary | ICD-10-CM | POA: Diagnosis not present

## 2022-03-28 DIAGNOSIS — E440 Moderate protein-calorie malnutrition: Secondary | ICD-10-CM

## 2022-03-28 DIAGNOSIS — J449 Chronic obstructive pulmonary disease, unspecified: Secondary | ICD-10-CM | POA: Diagnosis not present

## 2022-03-28 DIAGNOSIS — L89153 Pressure ulcer of sacral region, stage 3: Secondary | ICD-10-CM

## 2022-03-28 DIAGNOSIS — J441 Chronic obstructive pulmonary disease with (acute) exacerbation: Secondary | ICD-10-CM | POA: Diagnosis not present

## 2022-03-28 LAB — COMPREHENSIVE METABOLIC PANEL
ALT: 228 U/L — ABNORMAL HIGH (ref 0–44)
AST: 27 U/L (ref 15–41)
Albumin: 2.3 g/dL — ABNORMAL LOW (ref 3.5–5.0)
Alkaline Phosphatase: 68 U/L (ref 38–126)
BUN: 22 mg/dL — ABNORMAL HIGH (ref 6–20)
CO2: >45 mmol/L — ABNORMAL HIGH (ref 22–32)
Calcium: 8.2 mg/dL — ABNORMAL LOW (ref 8.9–10.3)
Chloride: 83 mmol/L — ABNORMAL LOW (ref 98–111)
Creatinine, Ser: <0.3 mg/dL — ABNORMAL LOW (ref 0.44–1.00)
Glucose, Bld: 90 mg/dL (ref 70–99)
Potassium: 4.5 mmol/L (ref 3.5–5.1)
Sodium: 140 mmol/L (ref 135–145)
Total Bilirubin: 0.6 mg/dL (ref 0.3–1.2)
Total Protein: 5 g/dL — ABNORMAL LOW (ref 6.5–8.1)

## 2022-03-28 LAB — CBC
HCT: 35.2 % — ABNORMAL LOW (ref 36.0–46.0)
Hemoglobin: 10.5 g/dL — ABNORMAL LOW (ref 12.0–15.0)
MCH: 32.1 pg (ref 26.0–34.0)
MCHC: 29.8 g/dL — ABNORMAL LOW (ref 30.0–36.0)
MCV: 107.6 fL — ABNORMAL HIGH (ref 80.0–100.0)
Platelets: 145 10*3/uL — ABNORMAL LOW (ref 150–400)
RBC: 3.27 MIL/uL — ABNORMAL LOW (ref 3.87–5.11)
RDW: 11.9 % (ref 11.5–15.5)
WBC: 10.7 10*3/uL — ABNORMAL HIGH (ref 4.0–10.5)
nRBC: 0 % (ref 0.0–0.2)

## 2022-03-28 LAB — PHOSPHORUS: Phosphorus: 2.3 mg/dL — ABNORMAL LOW (ref 2.5–4.6)

## 2022-03-28 LAB — MAGNESIUM: Magnesium: 2 mg/dL (ref 1.7–2.4)

## 2022-03-28 MED ORDER — PREDNISONE 20 MG PO TABS: 60.0000 mg | ORAL_TABLET | Freq: Every day | ORAL | Status: DC

## 2022-03-28 MED ORDER — PREDNISONE 10 MG PO TABS: 60.0000 mg | ORAL_TABLET | Freq: Every day | ORAL | 0 refills | Status: DC

## 2022-03-28 MED ORDER — DOXYCYCLINE HYCLATE 100 MG PO TABS: 100.0000 mg | ORAL_TABLET | Freq: Two times a day (BID) | ORAL | 0 refills | Status: DC

## 2022-03-28 MED ORDER — ATORVASTATIN CALCIUM 20 MG PO TABS: 20.0000 mg | ORAL_TABLET | Freq: Every day | ORAL | Status: AC

## 2022-03-28 MED ORDER — ENSURE ENLIVE PO LIQD: 237.0000 mL | Freq: Two times a day (BID) | ORAL | 12 refills | Status: DC

## 2022-03-28 MED ORDER — JUVEN PO PACK: 1.0000 | PACK | Freq: Two times a day (BID) | ORAL | 0 refills | Status: DC

## 2022-03-28 MED ORDER — DOXYCYCLINE HYCLATE 100 MG PO TABS
100.0000 mg | ORAL_TABLET | Freq: Two times a day (BID) | ORAL | Status: DC
  Administered 2022-03-28: 100 mg via ORAL
  Filled 2022-03-28: qty 1

## 2022-03-28 MED ORDER — BUDESONIDE 0.5 MG/2ML IN SUSP: 0.5000 mg | Freq: Two times a day (BID) | RESPIRATORY_TRACT | 1 refills | Status: DC

## 2022-03-28 NOTE — TOC Transition Note (Signed)
Transition of Care [facility]) - CM/SW Discharge Note   Patient Details  Name: Donna Watkins MRN: [id number] Date of Birth: 07/16/1960  [facility]) CM/SW Contact:  Boneta Lucks, RN Phone Number: 03/28/2022, 12:32 PM   Clinical Narrative:   Patient discharging home. Home health orders placed, [facility] updated. Also ordering Wound care [place] [address]. Per Alvis Lemmings she can have both HHRN and wound clinic for dressing changes.    Final next level of care: [place] Barriers to Discharge: Continued Medical Work up   Patient Goals and CMS Choice     Discharge Placement        Patient and family notified of of transfer: 03/28/22  Discharge Plan and Services Additional resources added to the After Visit Summary for         Social Determinants of Health (SDOH) Interventions SDOH Screenings   Food Insecurity: No Food Insecurity (03/26/2022)  Housing: Low Risk  (03/26/2022)  Transportation Needs: No Transportation Needs (03/26/2022)  Utilities: Not At Risk (03/26/2022)  Alcohol Screen: Low Risk  (07/30/2021)  Depression (PHQ2-9): Low Risk  (08/12/2020)  Financial Resource Strain: Low Risk  (07/30/2021)  Physical Activity: Sufficiently Active (07/30/2021)  Social Connections: Socially Isolated (07/30/2021)  Stress: No Stress Concern Present (07/30/2021)  Tobacco Use: Medium Risk (03/26/2022)     Readmission Risk Interventions    03/27/2022    1:15 PM 02/06/2022    2:58 PM  Readmission Risk Prevention Plan  Transportation Screening Complete Complete  Home Care Screening  Complete  Medication Review (RN CM)  Complete  HRI or Home Care Consult Complete   Social Work Consult for [place] Planning/Counseling Complete   Palliative Care Screening Not Applicable   Medication Review Press photographer) Complete

## 2022-03-28 NOTE — Progress Notes (Signed)
Spoke with patient's sister, Wyvonna Plum, who states her daughter should be here soon with a change of clothes and portable oxygen tank to transport patient home.

## 2022-03-28 NOTE — Progress Notes (Signed)
NURSING PROGRESS NOTE  Donna Watkins [id number] Discharge Data: 03/28/2022 1:28 PM Attending Provider: Orson Eva, MD [id number], [name], MD     [name] to be D/C'd Home with [facility] per MD order. Patient refused sacral dressing change, and stated, "I'll do it when I get home." RN emphasized importance of dressing changes and she insisted it did not need to be done prior to discharge. Discussed with the patient the After Visit Summary and all questions fully answered. All IV's discontinued with no bleeding noted. All belongings returned to patient for patient to take home.   Last Vital Signs:  Blood pressure (!) 102/59, pulse 90, temperature 98.3 F (36.8 C), temperature source Oral, resp. rate (!) 26, height 5\' 3"  (1.6 m), weight 45.4 kg, last menstrual period 05/06/2010, SpO2 93 %.  Discharge Medication List Allergies as of 03/28/2022   No Known Allergies      Medication List     STOP taking these medications    ibuprofen 200 MG tablet Commonly known as: ADVIL   ipratropium 0.02 % nebulizer solution Commonly known as: ATROVENT   methylPREDNISolone 4 MG Tbpk tablet Commonly known as: MEDROL DOSEPAK   traZODone 50 MG tablet Commonly known as: DESYREL       TAKE these medications    albuterol 108 (90 Base) MCG/ACT inhaler Commonly known as: VENTOLIN HFA Inhale 2 puffs into the lungs every 4 (four) hours as needed for wheezing or shortness of breath.   albuterol (2.5 MG/3ML) 0.083% nebulizer solution Commonly known as: PROVENTIL Take 2.5 mg by nebulization See admin instructions. 2.5 mg by nebulization five times daily   ALPRAZolam 1 MG tablet Commonly known as: XANAX Take 1 tablet (1 mg total) by mouth 3 (three) times daily as needed for anxiety.   atorvastatin 20 MG tablet Commonly known as: LIPITOR Take 1 tablet (20 mg total) by mouth daily. Restart on 04/03/22 Start taking on: April 03, 2022 What changed:  additional instructions These instructions  start on April 03, 2022. If you are unsure what to do until then, ask your doctor or other care provider.   budesonide 0.5 MG/2ML nebulizer solution Commonly known as: PULMICORT Take 2 mLs (0.5 mg total) by nebulization 2 (two) times daily.   CENTRUM SILVER PO Take 1 tablet by mouth daily.   doxycycline 100 MG tablet Commonly known as: VIBRA-TABS Take 1 tablet (100 mg total) by mouth every 12 (twelve) hours.   feeding supplement Liqd Take 237 mLs by mouth 2 (two) times daily between meals.   nutrition supplement (JUVEN) Pack Take 1 packet by mouth 2 (two) times daily between meals.   ipratropium-albuterol 0.5-2.5 (3) MG/3ML Soln Commonly known as: DUONEB Take 3 mLs by nebulization 3 (three) times daily. And as needed for SOB   omeprazole 20 MG capsule Commonly known as: PRILOSEC Take 20 mg by mouth daily.   OXYGEN Place 3 L into the nose continuous.   predniSONE 10 MG tablet Commonly known as: DELTASONE Take 6 tablets (60 mg total) by mouth daily with breakfast. And decrease by one tablet daily Start taking on: March 29, 2022               Durable Medical Equipment  (From admission, onward)           Start     Ordered   03/27/22 1339  For home use only DME Walker rolling  Once       Comments: Patient unsteady and at high risk for  falls without AD, safer using RW with good return for use demonstrated.  Question Answer Comment  Walker: With [place]   Patient needs a walker to treat with the following condition Gait difficulty      03/27/22 [place], RN

## 2022-03-28 NOTE — Discharge Summary (Addendum)
Physician Discharge Summary   Patient: Donna Watkins MRN: [id number] DOB: 08-30-1960  Admit date:     03/25/2022  Discharge date: 03/28/22  Discharge Physician: Shanon Brow Jaymarion Trombly   PCP: Lemmie Evens, MD   Recommendations at discharge:   Please follow up with primary care provider within 1-2 weeks  Please repeat BMP and CBC in one week     Hospital Course: 62 year old female with a history of COPD, chronic respiratory failure on 3 L, lung cancer status post XRT, anxiety, hyperlipidemia presenting from home secondary to hypoxia and tachycardia.  The patient states that she had been in her usual state of health until 03/25/2022 when she was working with home health physical therapy.  Apparently, home health PT noted the patient to be hypoxic with ambulation into the 85% on her usual 3 L.  The patient apparently was tachycardic into the 100s.  As result, EMS was activated.  Upon EMS arrival, she was noted to have oxygen saturation in the high 80s.  Show her oxygen was increased to 4 L. The patient herself states that she had been in her usual state of health.  She had denied any fevers, chills, chest pain, worsening cough, hemoptysis, nausea, vomiting, diarrhea, abdominal pain.  She states that her breathing was as same as usual.  She is rather sedentary and sits on the couch most of the day.  She needs assistance with her ADLs.  In the ED, the patient was afebrile and hemodynamically stable.  She was placed on BiPAP.  WBC 11.6, hemoglobin 12.9, platelets 141,000.  Sodium 142, potassium 3.7, bicarbonate >45.  Serum creatinine 0.44.  AST 211, ALT 670, alkaline phosphatase 97, total bilirubin 1.0.  CTA chest was negative for PE.  Showed a 1.9 x 1.4 cmright lower lobe nodule.  The patient was given DuoNebs and IV Solu-Medrol.  She was admitted for acute on chronic respiratory failure.  Assessment and Plan: Acute on chronic respiratory failure with hypoxia and hypercarbia -Secondary to COPD  exacerbation -03/25/2022 VBG 7.32/>123/<31/73 -Weaned off continuous BiPAP back to baseline 3 L -she still used BiPAP at night -Pt already has NIV machine at home that she is compliant with   COPD exacerbation -COVID-19/RSV/Flu negative -continuee brovana -continued pulmicort -continued yupleri -03/25/2022 CTA chest negative for PE -IV solumedrol>>d/c home with prednisone taper   Transaminasemia -Patient is hepatitis A immune -Hepatitis B and C--neg -right upper quadrant ultrasound---Diffuse gallbladder wall calcifications and shadowing consistent with the history of porcelain gallbladder. Evaluation for stones is limited. There is gallbladder wall thickening but no adjacent fluid -trend LFTs--improving -03/25/2022 CT abdomen--porcelain gallbladder; large stool burden; soft tissue stranding around the sacrum -discussed with general surgery, Dr. 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 Cherrie Distance indication presently for operative intervention   Unstageable sacral decubitus ulcer -Wound care consult -There is some mild cellulitis surrounding the wound -appreciate surgery>>debrided -received vanco during hospitalization D/c home with doxy x 4 more days   Anxiety -Continue home dose alprazolam -PDMP reviewed -Patient received Xanax 1 mg, #150 monthly, last refill 02/19/2022   Hypomagnesemia/hypophosphatemia -Repleted   Mixed hyperlipidemia -Holding statin secondary to elevated LFTs   GERD -Continue PPI   Thrombocytopenia -Appears to be chronic -monitor for signs of bleeding  Moderate malnutrition -continue supplements     Picture on 03/28/22       Consultants: general surgery Procedures performed: debridement of sacrum  Disposition: Home Diet recommendation:  Regular diet DISCHARGE MEDICATION: Allergies as of 03/28/2022   No Known  Allergies      Medication List     STOP taking these medications    ibuprofen 200 MG tablet Commonly known as: ADVIL   ipratropium 0.02 % nebulizer solution Commonly known as: ATROVENT   methylPREDNISolone 4 MG Tbpk tablet Commonly known as: MEDROL DOSEPAK   traZODone 50 MG tablet Commonly known as: DESYREL       TAKE these medications    albuterol 108 (90 Base) MCG/ACT inhaler Commonly known as: VENTOLIN HFA Inhale 2 puffs into the lungs every 4 (four) hours as needed for wheezing or shortness of breath.   albuterol (2.5 MG/3ML) 0.083% nebulizer solution Commonly known as: PROVENTIL Take 2.5 mg by nebulization See admin instructions. 2.5 mg by nebulization five times daily   ALPRAZolam 1 MG tablet Commonly known as: XANAX Take 1 tablet (1 mg total) by mouth 3 (three) times daily as needed for anxiety.   atorvastatin 20 MG tablet Commonly known as: LIPITOR Take 1 tablet (20 mg total) by mouth daily. Restart on 04/03/22 Start taking on: April 03, 2022 What changed:  additional instructions These instructions start on April 03, 2022. If you are unsure what to do until then, ask your doctor or other care provider.   budesonide 0.5 MG/2ML nebulizer solution Commonly known as: PULMICORT Take 2 mLs (0.5 mg total) by nebulization 2 (two) times daily.   CENTRUM SILVER PO Take 1 tablet by mouth daily.   doxycycline 100 MG tablet Commonly known as: VIBRA-TABS Take 1 tablet (100 mg total) by mouth every 12 (twelve) hours.   feeding supplement Liqd Take 237 mLs by mouth 2 (two) times daily between meals.   nutrition supplement (JUVEN) Pack Take 1 packet by mouth 2 (two) times daily between meals.   ipratropium-albuterol 0.5-2.5 (3) MG/3ML Soln Commonly known as: DUONEB Take 3 mLs by nebulization 3 (three) times daily. And as needed for SOB   omeprazole 20 MG capsule Commonly known as: PRILOSEC Take 20 mg by mouth daily.   OXYGEN Place 3 L into the nose  continuous.   predniSONE 10 MG tablet Commonly known as: DELTASONE Take 6 tablets (60 mg total) by mouth daily with breakfast. And decrease by one tablet daily Start taking on: March 29, 2022               Durable Medical Equipment  (From admission, onward)           Start     Ordered   03/27/22 1339  For home use only DME Walker rolling  Once       Comments: Patient unsteady and at high risk for falls without AD, safer using RW with good return for use demonstrated.  Question Answer Comment  Walker: With [place]   Patient needs a walker to treat with the following condition Gait difficulty      03/27/22 1339            Follow-up Information     Aviva Signs, MD Follow up.   Specialty: General Surgery Why: As needed Contact information: [address] [facility] [id number] [phone]                Discharge Exam: Filed Weights   03/26/22 1700 03/27/22 0515 03/28/22 0400  Weight: 45.7 kg 45.7 kg 45.4 kg   HEENT:  [place]/AT, No thrush, no icterus CV:  RRR, no rub, no S3, no S4 Lung:  diminished  BS.  Scattered rales. No wheeze Abd:  soft/+BS, NT Ext:  No edema, no lymphangitis, no synovitis, no rash   Condition at discharge: stable  The results of significant diagnostics from this hospitalization (including imaging, microbiology, ancillary and laboratory) are listed below for reference.   Imaging Studies: US Abdomen Limited RUQ (LIVER/GB)  Result Date: 03/26/2022 CLINICAL DATA:  Pain.  Elevated liver function tests EXAM: ULTRASOUND ABDOMEN LIMITED RIGHT UPPER QUADRANT COMPARISON:  CT 03/25/2022.  Ultrasound 06/02/2017 FINDINGS: Gallbladder: Once again there is echogenic shadowing along the margin of the gallbladder consistent with history of gallbladder wall calcification or porcelain gallbladder better depicted on the CT scan from 03/25/2022. This limits evaluation for luminal stones. No adjacent fluid. The gallbladder wall is  measured at 5 mm, thickened. Common bile duct: Diameter: 3 mm Liver: No focal lesion identified. Within normal limits in parenchymal echogenicity. Portal vein is patent on color Doppler imaging with normal direction of blood flow towards the liver. Other: None. IMPRESSION: Diffuse gallbladder wall calcifications and shadowing consistent with the history of porcelain gallbladder. Evaluation for stones is limited. There is gallbladder wall thickening but no adjacent fluid Electronically Signed   By: Jill Side M.D.   On: 03/26/2022 09:56   CT ABDOMEN PELVIS W CONTRAST  Result Date: 03/25/2022 CLINICAL DATA:  Fall, abdominal pain. EXAM: CT ABDOMEN AND PELVIS WITH CONTRAST TECHNIQUE: Multidetector CT imaging of the abdomen and pelvis was performed using the standard protocol following bolus administration of intravenous contrast. RADIATION DOSE REDUCTION: This exam was performed according to the departmental dose-optimization program which includes automated exposure control, adjustment of the mA and/or kV according to patient size and/or use of iterative reconstruction technique. CONTRAST:  135mL OMNIPAQUE IOHEXOL 350 MG/ML SOLN COMPARISON:  06/25/2017 FINDINGS: Lower chest: See CTA chest report today Hepatobiliary: Gallbladder wall calcifications compatible with a porcelain gallbladder. This is unchanged since prior study. No visible stones. Small hypodensity in the right hepatic lobe is most compatible with small cyst. Pancreas: No focal abnormality or ductal dilatation. Spleen: No focal abnormality.  Normal size. Adrenals/Urinary Tract: No adrenal abnormality. No focal renal abnormality. No stones or hydronephrosis. Urinary bladder is unremarkable. Stomach/Bowel: Large stool burden throughout the colon. Stomach, large and small bowel grossly unremarkable. Vascular/Lymphatic: Scattered aortic calcifications. No evidence of aneurysm or adenopathy. Reproductive: Uterus and adnexa unremarkable.  No mass. Other: No  free fluid or free air. Musculoskeletal: There appears to be a sacral decubitus ulcer with gas in the soft tissues overlying the sacrum extending to the skin surface. No bony changes of osteomyelitis. No acute bony abnormality. 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 Large stool burden in the colon. Scattered aortic atherosclerosis. Soft tissue gas in the soft tissues overlying the sacrum most compatible with sacral decubitus ulcer. No underlying changes of osteomyelitis. Electronically Signed   By: Rolm Baptise M.D.   On: 03/25/2022 19:14   CT Angio Chest PE W and/or Wo Contrast  Result Date: 03/25/2022 CLINICAL DATA:  Pulmonary embolism (PE) suspected, low to intermediate prob, positive D-dimer Pulmonary embolism (PE) suspected, high prob. Fall. Shortness of breath. EXAM: CT ANGIOGRAPHY CHEST WITH CONTRAST TECHNIQUE: Multidetector CT imaging of the chest was performed using the standard protocol during bolus administration of intravenous contrast. Multiplanar CT image reconstructions and MIPs were obtained to evaluate the vascular anatomy. RADIATION DOSE REDUCTION: This exam was performed according to the departmental dose-optimization program which includes automated exposure control, adjustment of the mA and/or kV according to patient size  and/or use of iterative reconstruction technique. CONTRAST:  164mL OMNIPAQUE IOHEXOL 350 MG/ML SOLN COMPARISON:  01/12/2022.  PET CT 02/05/2022. FINDINGS: Cardiovascular: No filling defects in the pulmonary arteries to suggest pulmonary emboli. Heart is normal size. Aorta is normal caliber. Scattered aortic calcifications. Mediastinum/Nodes: No mediastinal, hilar, or axillary adenopathy. Trachea and esophagus are unremarkable. Thyroid unremarkable. Lungs/Pleura: Moderate to advanced emphysema. Bilobed right lower lobe pulmonary nodule measures 1.9 x 1.4 cm. This is increased in size, previously measuring 1.4 x 1.3 cm. This is concerning for  malignancy. Areas of scarring in the left lower lobe. No effusions. Upper Abdomen: No acute findings Musculoskeletal: Chest wall soft tissues are unremarkable. No acute bony abnormality. Review of the MIP images confirms the above findings. IMPRESSION: No evidence of pulmonary embolus. 1.9 x 1.4 cm bilobed right lower lobe pulmonary nodule, increasing in size since prior study. This is concerning for malignancy. This has been previously evaluated with PET CT. Aortic Atherosclerosis (ICD10-I70.0) and Emphysema (ICD10-J43.9). Electronically Signed   By: Rolm Baptise M.D.   On: 03/25/2022 19:11   DG Chest Port 1 View  Result Date: 03/25/2022 CLINICAL DATA:  Shortness of breath EXAM: PORTABLE CHEST 1 VIEW COMPARISON:  02/09/2022 FINDINGS: Unchanged cardiac and mediastinal contours. Redemonstrated hyperinflation, compatible with COPD. Redemonstrated right lower lung nodule, better evaluated on the 11/26/2021 CTA and 02/05/2022 PET-CT. No new focal pulmonary opacity. No pleural effusion or pneumothorax. No acute osseous abnormality. IMPRESSION: 1. No acute cardiopulmonary process. 2. Redemonstrated right lower lung nodule, better evaluated on the 11/26/2021 CTA and 02/05/2022 PET-CT. Electronically Signed   By: Merilyn Baba M.D.   On: 03/25/2022 16:20    Microbiology: Results for orders placed or performed during the hospital encounter of 03/25/22  Resp panel by RT-PCR (RSV, Flu A&B, Covid) Anterior Nasal Swab     Status: None   Collection Time: 03/25/22  4:22 PM   Specimen: Anterior Nasal Swab  Result Value Ref Range Status   SARS Coronavirus 2 by RT PCR NEGATIVE NEGATIVE Final    Comment: (NOTE) SARS-CoV-2 target nucleic acids are NOT DETECTED.  The SARS-CoV-2 RNA is generally detectable in upper respiratory specimens during the acute phase of infection. The lowest concentration of SARS-CoV-2 viral copies this assay can detect is 138 copies/mL. A negative result does not preclude  SARS-Cov-2 infection and should not be used as the sole basis for treatment or other patient management decisions. A negative result may occur with  improper specimen collection/handling, submission of specimen other than nasopharyngeal swab, presence of viral mutation(s) within the areas targeted by this assay, and inadequate number of viral copies(<138 copies/mL). A negative result must be combined with clinical observations, patient history, and epidemiological information. The expected result is Negative.  Fact Sheet for Patients:  EntrepreneurPulse.com.au  Fact Sheet for Healthcare Providers:  IncredibleEmployment.be  This test is no t yet approved or cleared by the [place] FDA and  has been authorized for detection and/or diagnosis of SARS-CoV-2 by FDA under an Emergency Use Authorization (EUA). This EUA will remain  in effect (meaning this test can be used) for the duration of the COVID-19 declaration under Section 564(b)(1) of the Act, 21 U.S.C.section 360bbb-3(b)(1), unless the authorization is terminated  or revoked sooner.       Influenza A by PCR NEGATIVE NEGATIVE Final   Influenza B by PCR NEGATIVE NEGATIVE Final    Comment: (NOTE) The Xpert Xpress SARS-CoV-2/FLU/RSV plus assay is intended as an aid in the diagnosis of influenza from Nasopharyngeal  swab specimens and should not be used as a sole basis for treatment. Nasal washings and aspirates are unacceptable for Xpert Xpress SARS-CoV-2/FLU/RSV testing.  Fact Sheet for Patients: EntrepreneurPulse.com.au  Fact Sheet for Healthcare Providers: IncredibleEmployment.be  This test is not yet approved or cleared by the [place] FDA and has been authorized for detection and/or diagnosis of SARS-CoV-2 by FDA under an Emergency Use Authorization (EUA). This EUA will remain in effect (meaning this test can be used) for the duration of  the COVID-19 declaration under Section 564(b)(1) of the Act, 21 U.S.C. section 360bbb-3(b)(1), unless the authorization is terminated or revoked.     Resp Syncytial Virus by PCR NEGATIVE NEGATIVE Final    Comment: (NOTE) Fact Sheet for Patients: EntrepreneurPulse.com.au  Fact Sheet for Healthcare Providers: IncredibleEmployment.be  This test is not yet approved or cleared by the [place] FDA and has been authorized for detection and/or diagnosis of SARS-CoV-2 by FDA under an Emergency Use Authorization (EUA). This EUA will remain in effect (meaning this test can be used) for the duration of the COVID-19 declaration under Section 564(b)(1) of the Act, 21 U.S.C. section 360bbb-3(b)(1), unless the authorization is terminated or revoked.  Performed at [facility] [address] [place], [place] [address]   Blood Culture (routine x 2)     Status: None (Preliminary result)   Collection Time: 03/25/22  8:05 PM   Specimen: BLOOD  Result Value Ref Range Status   Specimen Description BLOOD BLOOD RIGHT ARM  Final   Special Requests   Final    BOTTLES DRAWN AEROBIC AND ANAEROBIC Blood Culture adequate volume   Culture   Final    NO GROWTH 3 DAYS Performed at [facility], [address] [place], [place] [address]    Report Status PENDING  Incomplete  Blood Culture (routine x 2)     Status: None (Preliminary result)   Collection Time: 03/25/22  8:13 PM   Specimen: BLOOD  Result Value Ref Range Status   Specimen Description BLOOD BLOOD RIGHT WRIST  Final   Special Requests   Final    BOTTLES DRAWN AEROBIC AND ANAEROBIC Blood Culture adequate volume   Culture   Final    NO GROWTH 3 DAYS Performed at [facility], [address], [place], [place] [address]    Report Status PENDING  Incomplete  MRSA Next Gen by PCR, Nasal     Status: None   Collection Time: 03/26/22  3:00 PM   Specimen: Nasal Mucosa; Nasal Swab  Result Value Ref Range Status    MRSA by PCR Next Gen NOT DETECTED NOT DETECTED Final    Comment: (NOTE) The GeneXpert MRSA Assay (FDA approved for NASAL specimens only), is one component of a comprehensive MRSA colonization surveillance program. It is not intended to diagnose MRSA infection nor to guide or monitor treatment for MRSA infections. Test performance is not FDA approved in patients less than 31 years old. Performed at [facility], [address], [place], [place] [address]     Labs: CBC: Recent Labs  Lab 03/25/22 1548 03/27/22 0411 03/28/22 0354  WBC 11.6* 11.3* 10.7*  NEUTROABS 10.7*  --   --   HGB 12.9 10.4* 10.5*  HCT 45.7 35.5* 35.2*  MCV 112.3* 106.6* 107.6*  PLT 141* 148* [id number]*   Basic Metabolic Panel: Recent Labs  Lab 03/25/22 1548 03/26/22 0606 03/27/22 0411 03/28/22 0354  NA 142  --  140 140  K 3.7  --  3.5 4.5  CL  79*  --  84* 83*  CO2 >45*  --  >45* >45*  GLUCOSE 117*  --  102* 90  BUN 18  --  15 22*  CREATININE 0.44  --  <0.30* <0.30*  CALCIUM 9.0  --  8.2* 8.2*  MG 2.0 1.6* 1.8 2.0  PHOS  --  2.9 1.9* 2.3*   Liver Function Tests: Recent Labs  Lab 03/25/22 1548 03/27/22 0411 03/28/22 0354  AST 211* 47* 27  ALT 670* 307* 228*  ALKPHOS 97 71 68  BILITOT 1.0 0.4 0.6  PROT 6.5 4.9* 5.0*  ALBUMIN 3.2* 2.3* 2.3*   CBG: No results for input(s): "GLUCAP" in the last 168 hours.  Discharge time spent: greater than 30 minutes.  Signed: Orson Eva, MD [facility] 03/28/2022

## 2022-03-28 NOTE — Progress Notes (Signed)
  Subjective: Patient states she is doing better today.  Objective: Vital signs in last 24 hours: Temp:  [96.3 F (35.7 C)-98.7 F (37.1 C)] 97.9 F (36.6 C) (03/23 0736) Pulse Rate:  [61-107] 68 (03/23 0736) Resp:  [15-29] 22 (03/23 0736) BP: (92-120)/(48-66) 104/63 (03/23 0700) SpO2:  [86 %-100 %] 95 % (03/23 0833) FiO2 (%):  [35 %] 35 % (03/23 0400) Weight:  [45.4 kg] 45.4 kg (03/23 0400) Last BM Date :  (PTA)  Intake/Output from previous day: 03/22 0701 - 03/23 0700 In: 150 [IV Piggyback:150] Out: 1550 [Urine:1550] Intake/Output this shift: No intake/output data recorded.  General appearance: alert, cooperative, and no distress Skin: Sacral decubitus ulcer with dusky adipose tissue over the sacrum.  Rim of superficial necrotic skin present.  No purulent drainage noted.  Lab Results:  Recent Labs    03/27/22 0411 03/28/22 0354  WBC 11.3* 10.7*  HGB 10.4* 10.5*  HCT 35.5* 35.2*  PLT 148* 145*   BMET Recent Labs    03/27/22 0411 03/28/22 0354  NA 140 140  K 3.5 4.5  CL 84* 83*  CO2 >45* >45*  GLUCOSE 102* 90  BUN 15 22*  CREATININE <0.30* <0.30*  CALCIUM 8.2* 8.2*   PT/INR No results for input(s): "LABPROT", "INR" in the last 72 hours.  Studies/Results: No results found.  Anti-infectives: Anti-infectives (From admission, onward)    Start     Dose/Rate Route Frequency Ordered Stop   03/26/22 2000  vancomycin (VANCOREADY) IVPB 750 mg/150 mL        750 mg 150 mL/hr over 60 Minutes Intravenous Every 24 hours 03/25/22 2122 04/01/22 1959   03/26/22 0545  vancomycin (VANCOCIN) IVPB 1000 mg/200 mL premix  Status:  Discontinued        1,000 mg 200 mL/hr over 60 Minutes Intravenous  Once 03/26/22 0534 03/26/22 0536   03/25/22 1945  vancomycin (VANCOCIN) IVPB 1000 mg/200 mL premix        1,000 mg 200 mL/hr over 60 Minutes Intravenous  Once 03/25/22 1942 03/25/22 2130   03/25/22 1945  cefTRIAXone (ROCEPHIN) 2 g in sodium chloride 0.9 % 100 mL IVPB  Status:   Discontinued        2 g 200 mL/hr over 30 Minutes Intravenous  Once 03/25/22 1942 03/25/22 1942   03/25/22 1945  cefTRIAXone (ROCEPHIN) 1 g in sodium chloride 0.9 % 100 mL IVPB        1 g 200 mL/hr over 30 Minutes Intravenous  Once 03/25/22 1942 03/25/22 2036       Assessment/Plan: Impression: Sacral decubitus ulcer secondary to poor nutrition, oxygenation, and sedentary lifestyle. Plan: Will add Santyl 3 times a week to her wound.  She should be referred to a wound care team for further ongoing care.  I can see her in my office in several weeks as needed.  Discussed with Dr. Carles Collet.  LOS: 2 days    Aviva Signs 03/28/2022

## 2022-03-30 LAB — CULTURE, BLOOD (ROUTINE X 2)
Culture: NO GROWTH
Culture: NO GROWTH
Special Requests: ADEQUATE
Special Requests: ADEQUATE

## 2022-03-31 ENCOUNTER — Telehealth: Payer: Self-pay | Admitting: *Deleted

## 2022-03-31 MED ORDER — SANTYL 250 UNIT/GM EX OINT: 1.0000 | TOPICAL_OINTMENT | Freq: Every day | CUTANEOUS | 11 refills | Status: DC

## 2022-03-31 NOTE — Telephone Encounter (Signed)
Received call from Judson Roch [facility] with [facility] [phone] telephone.   Requested clarification on wound orders for sacral pressure ulcer. States that wound care order is for Santyl QD and wet to dry dressing. States that no prescription for Annitta Needs has been sent to pharmacy. Prescription with refills sent to [facility].   Also inquired if referral can be placed to wound care. Please advise.

## 2022-04-01 NOTE — Telephone Encounter (Signed)
Discussed with Dr. Arnoldo Morale.   Recommended that wound care referral should come from PCP.   Call placed to Judson Roch, [place] with [facility] and made aware.

## 2022-04-13 NOTE — Progress Notes (Deleted)
Subjective:   Patient ID: Donna Watkins, female    DOB: [birth year]     MRN: [id number]    Brief patient profile:  61 yowf  MM/quit smoking in 2013 with severe copd at baseline last seen in pulmonary clinic  by [facility] [place] 11/2013  GOLD IV criteria with PFTs 05/2012 ith fev1 only 26%, diffusion at 52%    History of Present Illness  07/31/2016 1st office visit/ Donna Watkins   GOLD IV copd/ 02 2lpm 24/7  Chief Complaint  Patient presents with   Pulmonary Consult    Pt is self referred for COPD. Pt c/o DOE, throat clearing. Pt denies CP/tightness anf recentl f/c/s. Pt states she rarely uses albuterol hfa.   doe -  turns up 3lpm walking but still doe x 50 ft Use scooter at [employer] x 5 years  Sleeps on 4lpm / no am ha / cough or congestion  Uses neb avg bid, very poor  insight into meds/02  Some better breathing p neb saba rec  Call us if you want to be referred to [facility] for pulmonary rehab - in meantime continue 2lpm at rest and 3lpm with activity    Please schedule a follow up visit in 3 months but call sooner if needed with full pfts on return      12/31/2016  f/u ov/Donna Watkins re:  2lpm 24/7  Chief Complaint  Patient presents with   Follow-up    review PFT results. States breathing has been ok since last visit.    breathing ok at rest but can't walk 300 ft even on up to 3lpm s sob on flat surface slow pace  rec Stay on alb/ipatropium up to 4 x daily    07/02/2017  f/u ov/Donna Watkins re: GOLD IV  / 02 24/7  Chief Complaint  Patient presents with   Follow-up    Breathing is overall doing well overall. She is using her albuterol inhaler once per wk on average. She uses her albuterol and atrovent 2 x daily on average. She states she has been losing wt unintentionally.    Dyspnea:  Not usually walking more than a few hundred feet on 3lpm / still using scooter at [employer] Cough: not now Sleeping: insomnia no resp complaints  SABA use: about twice dialy duoneb  02: 2lpm x 3lpm  walking   rec Plan A = Automatic = Bevespi Take 2 puffs first thing in am and then another 2 puffs about 12 hours later.  Plan B = Backup Only use your albuterol as a rescue medication Plan C = Crisis - only use your albuterol (no ipatropium)  nebulizer if you first try Plan B and it fails to help > ok to use the nebulizer up to every 4 hours but if start needing it regularly call for immediate appointment        07/04/2018  f/u ov/Donna Watkins re:  GOLD IV  / 02 24/7  3lpm at home then 2lpm cont out/ duoneb only  Chief Complaint  Patient presents with   Follow-up    Breathing is about the same. No new co's. She rarely uses her albuterol inhaler but uses her neb 2 x daily.    Dyspnea:  Always rides scooter / room to room at home on 3lpm  Cough: no Sleeping: 2 pillows/ bed flat  SABA use: duoneb bid / can't afford bevespi and did not note change on vs off it 02: as above  No flares of copd since prior  rec Duoneb (combination of ipatropium and albuterol) is up to 4 x daily  Only use your albuterol as a rescue medication      02/27/2020  f/u ov/[place] office/Donna Watkins re: GOLD IV / o2 dep / never took pepcid and rash went away while still on omeprazole  Chief Complaint  Patient presents with   Follow-up    Needs o2 recert for [employer]. Breathing has been doing well. She is using her albuterol/atrovent nebs together 3-4 x per day and albuterol inhaler once per wk on average.    Dyspnea:  Still walking to [place] x 100 ft flat grade / slower pace than nl = MMRC3 = can't walk 100 yards even at a slow pace at a flat grade s stopping due to sob / now walking instead of riding  @ [employer] but still can't do wm Cough: none  Sleeping: flat bed/ 2 pillow no noctornal resp symptoms  SABA use: using as maint rx but only tid 02: 3lpm hs and 2-3 lpm rest of the time  Covid status: moderna x 2 06/01/19  Lung cancer screening: n/a  Rec  I very strongly recommend you get the third  moderna vaccine  No change  medications    08/14/2020  f/u ov/[place] office/Donna Watkins re: GOLD IV COPD/ 02 dep/ maint on duoneb as can't afford bevespi   Chief Complaint  Patient presents with   Follow-up    Breathing is unchanged. She has minimal dry cough. She states having PET scan soon to eval nodules found on CT done to eval back pain.   Onset of back pain in April 2022   Dyspnea:  still able to get to mb  - does not check sats while walking  Cough: none  Sleeping: 2 pillows/ flat bed  SABA use: just the qid duoneb 02: 3lpm hs and mostly on 3lpm  Covid status: vax x 2  Rec You will likely need a lung biopsy of some type and I would be happy to help you decide if Dr Sudie Bailey wishes but the PET should tell best option  Make sure you check your oxygen saturation  at your highest level of activity   RT complete Dec 2022 tol ok dx sq cell ca LUL  02/19/2021  f/u ov/[place] office/Donna Watkins re: COPD GOLD 4 / 02 dep   maint on duoneb qid   Chief Complaint  Patient presents with   Follow-up    Feels like breathing is about the same since last OV.    Dyspnea:  no longer doing mb  but is shopping ok at [employer]  Cough: none  Sleeping: no resp cc  SABA use: maybe once a day  02: 3lpm hs and daytime does not titrate  Covid status: vax x 4  Dysphagia x one year preceded RT and already on ppi s overt hb Rec Make sure you check your oxygen saturation  AT  your highest level of activity (not after you stop)   to be sure it stays over 90%   Have Dr Sudie Bailey refer you to a new GI doctor since Dr Darrick Penna has moved     08/18/2021  f/u ov/[place] office/Donna Watkins re: gold iv/ 02 Dep maint on duoneb bid and prn   Chief Complaint  Patient presents with   Follow-up    Heat bothers patients breathing but overall doing well.   Dyspnea:  still shopping [employer]  Cough: none  Sleeping: able to lie  down flat/level and 2 pillows  SABA use: rarely needing extra unless over does it 02: 3lpm hs  and during the day  Mild  dysphagia with small HH on CT chest 07/14/21  Rec No change in medications needed Please schedule a follow up visit in 6 months but call sooner if needed    04/14/2022  f/u ov/[place] office/Donna Watkins re: *** maint on ***  No chief complaint on file.   Dyspnea:  *** Cough: *** Sleeping: *** SABA use: *** 02: *** Covid status: *** Lung cancer screening: ***   No obvious day to day or daytime variability or assoc excess/ purulent sputum or mucus plugs or hemoptysis or cp or chest tightness, subjective wheeze or overt sinus or hb symptoms.   *** without nocturnal  or early am exacerbation  of respiratory  c/o's or need for noct saba. Also denies any obvious fluctuation of symptoms with weather or environmental changes or other aggravating or alleviating factors except as outlined above   No unusual exposure hx or h/o childhood pna/ asthma or knowledge of premature birth.  Current Allergies, Complete Past Medical History, Past Surgical History, Family History, and Social History were reviewed in [facility] electronic medical record.  ROS  The following are not active complaints unless bolded Hoarseness, sore throat, dysphagia, dental problems, itching, sneezing,  nasal congestion or discharge of excess mucus or purulent secretions, ear ache,   fever, chills, sweats, unintended wt loss or wt gain, classically pleuritic or exertional cp,  orthopnea pnd or arm/hand swelling  or leg swelling, presyncope, palpitations, abdominal pain, anorexia, nausea, vomiting, diarrhea  or change in bowel habits or change in bladder habits, change in stools or change in urine, dysuria, hematuria,  rash, arthralgias, visual complaints, headache, numbness, weakness or ataxia or problems with walking or coordination,  change in mood or  memory.        No outpatient medications have been marked as taking for the 04/14/22 encounter (Appointment) with Nyoka CowdenWert, Skylur Fuston B, MD.             Objective:   Physical  Exam  Wts  04/14/2022            ***  08/18/2021         112  02/19/2021         114  08/14/2020         120  02/27/2020         116 08/28/2019         107  05/26/2019       106  07/04/2018        129 01/03/2018      126  07/02/2017        128 12/31/2016      136   07/31/16 145 lb 9.6 oz (66 kg)  01/22/14 180 lb (81.6 kg)  05/23/13 180 lb 12.8 oz (82 kg)      01/03/18 126 lb (57.2 kg)  07/02/17 128 lb 9.6 oz (58.3 kg)  02/04/17 130 lb (59 kg)     Vital signs reviewed  04/14/2022  - Note at rest 02 sats  ***% on ***   General appearance:    ***       Mod barr   -   Mod clubbing.***               Assessment & Plan:

## 2022-04-14 ENCOUNTER — Ambulatory Visit: Payer: Medicare HMO | Admitting: Internal Medicine

## 2022-04-15 ENCOUNTER — Ambulatory Visit: Payer: Medicare HMO

## 2022-04-15 ENCOUNTER — Ambulatory Visit
Admission: RE | Admit: 2022-04-15 | Discharge: 2022-04-15 | Disposition: A | Discharge status: Home / Self Care | Payer: Medicare HMO | Source: Ambulatory Visit | Attending: Radiation Oncology | Admitting: Radiation Oncology

## 2022-04-15 ENCOUNTER — Telehealth: Payer: Self-pay | Admitting: Radiation Oncology

## 2022-04-15 NOTE — Telephone Encounter (Signed)
Called patient and patient's sister to reschedule consult w. Dr. Roselind Messier. No answer, unable to LVM.

## 2022-04-17 NOTE — Progress Notes (Signed)
Thoracic Location of Tumor / Histology: right lower lobe lung cancer  11-26-21 CT Angio Chest PE W/Cm &/Or Wo Cm  IMPRESSION: No evidence of pulmonary embolus.   Area of scarring in the medial left upper lobe is stable.   Continued slow enlargement of right lower lobe pulmonary nodule, now 8 x 8 mm compared to 7 x 5 mm previously.   Coronary artery disease.   Aortic Atherosclerosis (ICD10-I70.0) and Emphysema (ICD10-J43.9).   PET scan on 09-19-20 IMPRESSION: Hypermetabolic irregular 2.4 cm left upper lobe pulmonary nodule, most consistent with primary bronchogenic neoplasm.   No evidence of metastatic disease in the neck, chest, abdomen or pelvis.   Aortic Atherosclerosis (ICD10-I70.0) and Emphysema (ICD10-J43.9).  03-25-22  CT Angio Chest PE W and/or Wo Contrast  IMPRESSION: No evidence of pulmonary embolus.   1.9 x 1.4 cm bilobed right lower lobe pulmonary nodule, increasing in size since prior study. This is concerning for malignancy. This has been previously evaluated with PET CT.  Patient presented with symptoms of:    Biopsies revealed:  Per Dr. Hezzie Bump 04/06/22  Tobacco/Marijuana/Snuff/ETOH use: no, former smoker   Past/Anticipated interventions by cardiothoracic surgery, if any: none at this time in EMR  Past/Anticipated interventions by medical oncology, if any:     Dr. Elbert Ewings 07-21-21 for left lung cancer ASSESSMENT:  1.  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 - No weight loss reported.  No chest pains. - MRI of the thoracic spine on 07/25/2020 showed incidental left upper lobe nodule measuring 2.3 x 1.5 x 1.7 cm. - CT of the chest with contrast on 07/31/2020 showed irregular nodule of the posteromedial left upper lobe abutting the pleura measuring 2.4 x 1.6 cm.  No pleural effusion or pneumothorax.  No adenopathy in the chest. - Denies any recent infections or  COVID. - She has been on oxygen 24/7 for the last 10 years secondary to COPD. - SBRT to the left lung malignancy from 12/12/2020 - 12/19/2020, 50 Gray in 5 fractions.  2.  Social/family history: - She lives with her sister at home and is independent of ADLs and IADLs. - She has been on disability. - She quit smoking 10 years ago.  Smoked 2 to 3 pack/day for 30 years. - No family history of malignancies.     PLAN:  1.  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  Slowly progressive 0.7 x 0.5 cm nodule in the right lower lobe.  Atypical infectious bronchiolitis given somewhat branching/clustered appearance.  Other benign findings were discussed. - Reviewed labs from 07/14/2021 which showed normal LFTs, creatinine.  CBC was grossly normal with mild leukopenia and mild thrombocytopenia. - Recommend follow-up in 6 months with repeat CT scan of the chest with contrast. - We will also check B12, folic acid, ferritin and iron panel.  Signs/Symptoms Weight changes, if any: {:18581} Respiratory complaints, if any: {:18581} Hemoptysis, if any: {:18581} Pain issues, if any:  {:18581}  SAFETY ISSUES: Prior radiation? Yes  Pacemaker/ICD? {:18581}  Possible current pregnancy?{:18581} Is the patient on methotrexate? no  Current Complaints / other details:  ***

## 2022-04-28 ENCOUNTER — Other Ambulatory Visit (HOSPITAL_COMMUNITY): Payer: Self-pay | Admitting: Family Medicine

## 2022-04-28 DIAGNOSIS — Z1231 Encounter for screening mammogram for malignant neoplasm of breast: Secondary | ICD-10-CM

## 2022-04-28 NOTE — Progress Notes (Signed)
Radiation Oncology         [phone] ________________________________  Outpatient Re-Consultation  Name: Donna Watkins MRN: [id number]  Date: 04/29/2022  DOB: 1960-12-10  JX:[id number], Brett Canales, MD  Towana Badger, MD   REFERRING PHYSICIAN: Towana Badger, MD  DIAGNOSIS: There were no encounter diagnoses.  Enlarging lesion of the right lower lobe    Squamous cell carcinoma of the LUL, Clinical Stage IA-3, (T1c, N0, M0)  Interval Since Last Radiation: 1 year, 3 months, and 26 days    Intent: Curative  Radiation Treatment Dates: 12/09/2020 through 12/19/2020 Site Technique Total Dose (Gy) Dose per Fx (Gy) Completed Fx Beam Energies  Lung, Left: Lung_Lt SBRT 50/50 10 5/5 6XFFF    HISTORY OF PRESENT ILLNESS::Donna Watkins is a 62 y.o. female who is accompanied by ***. she is seen as a courtesy of Dr. Hezzie Bump for an opinion concerning radiation therapy as part of management for her recently diagnosed RLL lesion. The patient was last seen here for follow-up in January 2023 one month after completing radiation therapy to the left lung. She was doing well and NED on examination at that time. The patient continued to follow with pulmonology (Dr. Sherene Sires) and Dr. Ellin Saba under surveillance with routine follow-up imaging.   Follow-up chest CT with contrast on 03/12/2021 showed a positive response to therapy, characterized by regression of the left upper lobe lung mass. No signs of progression were appreciated.   Follow-up chest with on 07/14/21 showed further reduction of the left upper lobe nodule measuring 2.3 x 0.8 cm. CT however showed slow progression of a 0.7 x 0.5 cm nodule in the right lower lobe, and evidence of atypical infectious bronchiolitis demonstrated by a somewhat branching/clustered appearing area.   For the slowly growing RLL nodule, Dr. Ellin Saba recommended proceeding with follow-up imaging in 6 months.   Before follow-up imaging could be performed,  the patient was brought to the ED on 11/25/21 via EMS from home after having a syncopal episode at home secondary to complications from polypharmacy. She was admitted for management of acute COPD exacerbation with hypoxia and hypercapnia. Hospital course included duo nebs, Mucinex, Solu-Medrol, and azithromycin with improvement in her condition. She was discharged home on 11/27/21 with a steroid taper, doxycycline, bronchodilators, and mucolytics. Work-up performed while inpatient included a CTA of the chest which showed no evidence of pulmonary embolus but further enlargement of right lower lobe pulmonary nodule, measuring 8 x 8 mm (7 x 5 mm previously). (CT of the head was also performed which showed no acute intracranial pathology).   The patient is also followed by the [facility] in [place]. She presented for a follow up chest CT at [facility] on 01/12/22 which showed a further increase in size of the RLL nodule, measuring 1.4 x 1.3 cm.   The patient was again hospitalized from 02/05/22 through 02/10/22 with acute on chronic respiratory failure with hypoxia. She presented with progressive dyspnea over the course of 4 days and confusion due to significant hypercapnia.  She required frequent durations of respiratory support (consisting of BIPAP) and would deteriorate quickly in the absence of a non-invasive mechanical ventilator.  Following hospital course, she was able to maintain adequate saturation on her baseline 3L O2, however she was discharged home with a NIV machine which she now uses nightly.   For the RLL nodule, biopsy was felt to be risky due to severe emphysematous changes in the lung. With that being said,  a PET scan was ordered  on 03/02/2022 which redemonstrated the hypermetabolic right lower lobe lung nodule concerning for carcinoma. No other sites of disease were appreciated or evidence of metastatic disease.   The patient was seen in consultation by Dr. Langston Masker [facility] Rad-On[facility]) on  03/05/22 to discuss the role of radiation to the RLL nodule. During this visit, the patient was not able to hold a conversation and appeared to be drifting in and out of consciousness. Upon attempting to do a physical exam, there was a decline in her responsiveness and EMS was contacted to transfer her to the [facility] ED.  The patient's care taker (her sister) was present during that visit and Dr Langston Masker informed her that there are not really any viable treatment options in his opinion. However, the patient's sister insisted that forgoing treatment is not an option. With that being said, Dr. Langston Masker discussed the role of stereotactic body radiation therapy. However, given the anatomic location, this would only be able to be performed at [facility] or [facility]. The option of a hypofractionated approach was also reviewed.   The patient was subsequently admitted overnight at the [facility] (from 03/05/22 through 03/06/22) with chronic respiratory failure with hypoxia and hypercapnia. She was again placed on a BiPAP and improved overnight.   She was again hospitalized from 03/25/22 through 03/28/22 for the same, secondary to COPD exacerbation. Hospital course once again included BiPAP which she was weaned off of back to her baseline 3L. 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 General surgery was consulted who did not recommend surgical intervention. CTA of the chest was also performed while inpatient on 03/20 and showed a further interval increase in size of the RLL nodule, measuring 1.9 x 1.4 cm, concerning for malignancy.   The patient was recently seen by Dr. Hezzie Bump at the [facility] center on 04/06/22. As noted previously, the patient is not a candidate for surgery to the right lung, (or even biopsies of the RLL nodule) due to her chronic respiratory failure, severe  emphysema, and poor lung reserves. Subsequently, Dr. Hezzie Bump has referred her to me for consideration of SBRT to the RLL lesion.  Of note: The patient was found to have a subtle distortion in the left breast on screening mammogram performed in May 2023. This was redemonstrated on diagnostic imaging. A biopsy was attempted but the area could not be located. She most recently presented for a repeat diagnostic left breast mammogram on 12/16/21 which showed no evidence of malignancy in the left breast.    PAST MEDICAL HISTORY:  Past Medical History:  Diagnosis Date   Anxiety    panic attacks   Cancer (HCC)    COPD (chronic obstructive pulmonary disease) (HCC) 03/01/2012   Depression    GERD (gastroesophageal reflux disease)    History of radiation therapy    Left Lung- 12/09/20-12/19/20- Dr. Antony Blackbird   Hyperlipidemia    Mild scoliosis    Oxygen dependent     PAST SURGICAL HISTORY: Past Surgical History:  Procedure Laterality Date   BRONCHIAL BIOPSY  11/04/2020   Procedure: BRONCHIAL BIOPSIES;  Surgeon: Leslye Peer, MD;  Location: [facility] ENDOSCOPY;  Service: Pulmonary;;   BRONCHIAL BRUSHINGS  11/04/2020   Procedure: BRONCHIAL BRUSHINGS;  Surgeon: Leslye Peer, MD;  Location: [facility] ENDOSCOPY;  Service: Pulmonary;;   BRONCHIAL NEEDLE ASPIRATION BIOPSY  11/04/2020   Procedure: BRONCHIAL NEEDLE ASPIRATION BIOPSIES;  Surgeon:  Leslye Peer, MD;  Location: [facility] ENDOSCOPY;  Service: Pulmonary;;   COLONOSCOPY N/A 09/09/2012   Procedure: COLONOSCOPY;  Surgeon: West Bali, MD;  Location: [facility] ENDO SUITE;  Service: Endoscopy;  Laterality: N/A;  1:15-moved to 12:45 Melanie notified pt   FIDUCIAL MARKER PLACEMENT  11/04/2020   Procedure: FIDUCIAL MARKER PLACEMENT;  Surgeon: Leslye Peer, MD;  Location: [facility] ENDOSCOPY;  Service: Pulmonary;;   TUBAL LIGATION     VIDEO BRONCHOSCOPY WITH ENDOBRONCHIAL NAVIGATION N/A 11/04/2020   Procedure: ROBOTIC  VIDEO BRONCHOSCOPY WITH ENDOBRONCHIAL  NAVIGATION;  Surgeon: Leslye Peer, MD;  Location: [facility] ENDOSCOPY;  Service: Pulmonary;  Laterality: N/A;    FAMILY HISTORY:  Family History  Problem Relation Age of Onset   Heart disease Father    COPD Mother    Arthritis Sister    Colon cancer Neg Hx     SOCIAL HISTORY:  Social History   Tobacco Use   Smoking status: Former    Packs/day: 2.50    Years: 30.00    Additional pack years: 0.00    Total pack years: 75.00    Types: Cigarettes    Quit date: 06/12/2011    Years since quitting: 10.8   Smokeless tobacco: Never  Vaping Use   Vaping Use: Never used  Substance Use Topics   Alcohol use: No   Drug use: No    ALLERGIES: No Known Allergies  MEDICATIONS:  Current Outpatient Medications  Medication Sig Dispense Refill   albuterol (PROVENTIL) (2.5 MG/3ML) 0.083% nebulizer solution Take 2.5 mg by nebulization See admin instructions. 2.5 mg by nebulization five times daily     albuterol (VENTOLIN HFA) 108 (90 Base) MCG/ACT inhaler Inhale 2 puffs into the lungs every 4 (four) hours as needed for wheezing or shortness of breath. 1 each 3   ALPRAZolam (XANAX) 1 MG tablet Take 1 tablet (1 mg total) by mouth 3 (three) times daily as needed for anxiety. 30 tablet 0   atorvastatin (LIPITOR) 20 MG tablet Take 1 tablet (20 mg total) by mouth daily. Restart on 04/03/22     budesonide (PULMICORT) 0.5 MG/2ML nebulizer solution Take 2 mLs (0.5 mg total) by nebulization 2 (two) times daily. 120 mL 1   collagenase (SANTYL) 250 UNIT/GM ointment Apply 1 Application topically daily. 15 g 11   doxycycline (VIBRA-TABS) 100 MG tablet Take 1 tablet (100 mg total) by mouth every 12 (twelve) hours. 8 tablet 0   feeding supplement (ENSURE ENLIVE / ENSURE PLUS) LIQD Take 237 mLs by mouth 2 (two) times daily between meals. 237 mL 12   ipratropium-albuterol (DUONEB) 0.5-2.5 (3) MG/3ML SOLN Take 3 mLs by nebulization 3 (three) times daily. And as needed for SOB 360 mL 3   Multiple Vitamins-Minerals  (CENTRUM SILVER PO) Take 1 tablet by mouth daily.     nutrition supplement, JUVEN, (JUVEN) PACK Take 1 packet by mouth 2 (two) times daily between meals.  0   omeprazole (PRILOSEC) 20 MG capsule Take 20 mg by mouth daily.     OXYGEN Place 3 L into the nose continuous.     predniSONE (DELTASONE) 10 MG tablet Take 6 tablets (60 mg total) by mouth daily with breakfast. And decrease by one tablet daily 21 tablet 0   No current facility-administered medications for this encounter.    REVIEW OF SYSTEMS:  A 10+ POINT REVIEW OF SYSTEMS WAS OBTAINED including neurology, dermatology, psychiatry, cardiac, respiratory, lymph, extremities, GI, GU, musculoskeletal, constitutional, reproductive, HEENT. ***   PHYSICAL EXAM:  vitals were not taken for this visit.   General: Alert and oriented, in no acute distress HEENT: Head is normocephalic. Extraocular movements are intact. Oropharynx is clear. Neck: Neck is supple, no palpable cervical or supraclavicular lymphadenopathy. Heart: Regular in rate and rhythm with no murmurs, rubs, or gallops. Chest: Clear to auscultation bilaterally, with no rhonchi, wheezes, or rales. Abdomen: Soft, nontender, nondistended, with no rigidity or guarding. Extremities: No cyanosis or edema. Lymphatics: see Neck Exam Skin: No concerning lesions. Musculoskeletal: symmetric strength and muscle tone throughout. Neurologic: Cranial nerves II through XII are grossly intact. No obvious focalities. Speech is fluent. Coordination is intact. Psychiatric: Judgment and insight are intact. Affect is appropriate. ***  ECOG = ***  0 - Asymptomatic (Fully active, able to carry on all predisease activities without restriction)  1 - Symptomatic but completely ambulatory (Restricted in physically strenuous activity but ambulatory and able to carry out work of a light or sedentary nature. For example, light housework, office work)  2 - Symptomatic, <50% in bed during the day (Ambulatory  and capable of all self care but unable to carry out any work activities. Up and about more than 50% of waking hours)  3 - Symptomatic, >50% in bed, but not bedbound (Capable of only limited self-care, confined to bed or chair 50% or more of waking hours)  4 - Bedbound (Completely disabled. Cannot carry on any self-care. Totally confined to bed or chair)  5 - Death   Santiago Glad MM, Creech RH, Tormey DC, et al. [id number]). "Toxicity and response criteria of the [facility] Group". Am. Evlyn Clines. Oncol. 5 (6): 649-55  LABORATORY DATA:  Lab Results  Component Value Date   WBC 10.7 (H) 03/28/2022   HGB 10.5 (L) 03/28/2022   HCT 35.2 (L) 03/28/2022   MCV 107.6 (H) 03/28/2022   PLT 145 (L) 03/28/2022   NEUTROABS 10.7 (H) 03/25/2022   Lab Results  Component Value Date   NA 140 03/28/2022   K 4.5 03/28/2022   CL 83 (L) 03/28/2022   CO2 >45 (H) 03/28/2022   GLUCOSE 90 03/28/2022   BUN 22 (H) 03/28/2022   CREATININE <0.30 (L) 03/28/2022   CALCIUM 8.2 (L) 03/28/2022      RADIOGRAPHY: No results found.    IMPRESSION: Enlarging lesion of the right lower lobe    Squamous cell carcinoma of the LUL, Clinical Stage IA-3, (T1c, N0, M0)  ***  Today, I talked to the patient and family about the findings and work-up thus far.  We discussed the natural history of *** and general treatment, highlighting the role of radiotherapy in the management.  We discussed the available radiation techniques, and focused on the details of logistics and delivery.  We reviewed the anticipated acute and late sequelae associated with radiation in this setting.  The patient was encouraged to ask questions that I answered to the best of my ability. *** A patient consent form was discussed and signed.  We retained a copy for our records.  The patient would like to proceed with radiation and will be scheduled for CT simulation.  PLAN: ***    *** minutes of total time was spent for this patient encounter,  including preparation, face-to-face counseling with the patient and coordination of care, physical exam, and documentation of the encounter.   ------------------------------------------------  Billie Lade, PhD, MD  This document serves as a record of services personally performed by Antony Blackbird, MD. It was created on his behalf by Neena Rhymes,  a trained medical scribe. The creation of this record is based on the scribe's personal observations and the provider's statements to them. This document has been checked and approved by the attending provider.

## 2022-04-29 ENCOUNTER — Encounter: Payer: Self-pay | Admitting: Radiation Oncology

## 2022-04-29 ENCOUNTER — Ambulatory Visit
Admission: RE | Admit: 2022-04-29 | Discharge: 2022-04-29 | Disposition: A | Discharge status: Home / Self Care | Payer: Medicare HMO | Source: Ambulatory Visit | Attending: Radiation Oncology | Admitting: Radiation Oncology

## 2022-04-29 ENCOUNTER — Other Ambulatory Visit: Payer: Self-pay

## 2022-04-29 VITALS — BP 121/70 | HR 117 | Temp 97.2°F | Resp 18 | Ht 63.0 in | Wt 88.1 lb

## 2022-04-29 DIAGNOSIS — J439 Emphysema, unspecified: Secondary | ICD-10-CM | POA: Insufficient documentation

## 2022-04-29 DIAGNOSIS — Z87891 Personal history of nicotine dependence: Secondary | ICD-10-CM | POA: Diagnosis not present

## 2022-04-29 DIAGNOSIS — K219 Gastro-esophageal reflux disease without esophagitis: Secondary | ICD-10-CM | POA: Insufficient documentation

## 2022-04-29 DIAGNOSIS — R911 Solitary pulmonary nodule: Secondary | ICD-10-CM | POA: Diagnosis not present

## 2022-04-29 DIAGNOSIS — Z923 Personal history of irradiation: Secondary | ICD-10-CM | POA: Insufficient documentation

## 2022-04-29 DIAGNOSIS — J9610 Chronic respiratory failure, unspecified whether with hypoxia or hypercapnia: Secondary | ICD-10-CM | POA: Insufficient documentation

## 2022-04-29 DIAGNOSIS — Z9981 Dependence on supplemental oxygen: Secondary | ICD-10-CM | POA: Diagnosis not present

## 2022-04-29 DIAGNOSIS — Z79899 Other long term (current) drug therapy: Secondary | ICD-10-CM | POA: Diagnosis not present

## 2022-04-29 DIAGNOSIS — Z7951 Long term (current) use of inhaled steroids: Secondary | ICD-10-CM | POA: Insufficient documentation

## 2022-04-29 DIAGNOSIS — C3412 Malignant neoplasm of upper lobe, left bronchus or lung: Secondary | ICD-10-CM | POA: Insufficient documentation

## 2022-04-29 DIAGNOSIS — C3431 Malignant neoplasm of lower lobe, right bronchus or lung: Secondary | ICD-10-CM

## 2022-04-29 DIAGNOSIS — E785 Hyperlipidemia, unspecified: Secondary | ICD-10-CM | POA: Insufficient documentation

## 2022-05-01 ENCOUNTER — Inpatient Hospital Stay: Payer: Medicare HMO | Attending: Radiation Oncology | Admitting: Licensed Clinical Social Worker

## 2022-05-01 DIAGNOSIS — C3490 Malignant neoplasm of unspecified part of unspecified bronchus or lung: Secondary | ICD-10-CM

## 2022-05-01 NOTE — Progress Notes (Signed)
[facility] CSW Progress Note  Clinical Child psychotherapist  received referral to assess.   Multiple voicemails left for pt with no return call.  Pt scheduled to see radiation 5/1.  CSW to attempt to see pt at that time.  Pt anticipated to have radiation at [facility] and then follow with [facility] for follow up oncology care.        Rachel Moulds, LCSW    Patient is participating in a Managed Medicaid Plan:  Yes

## 2022-05-06 ENCOUNTER — Ambulatory Visit
Admission: RE | Admit: 2022-05-06 | Discharge: 2022-05-06 | Disposition: A | Discharge status: Home / Self Care | Payer: Medicare HMO | Source: Ambulatory Visit | Attending: Radiation Oncology | Admitting: Radiation Oncology

## 2022-05-06 DIAGNOSIS — C3431 Malignant neoplasm of lower lobe, right bronchus or lung: Secondary | ICD-10-CM | POA: Diagnosis not present

## 2022-05-06 DIAGNOSIS — R911 Solitary pulmonary nodule: Secondary | ICD-10-CM

## 2022-05-07 DIAGNOSIS — C3431 Malignant neoplasm of lower lobe, right bronchus or lung: Secondary | ICD-10-CM | POA: Diagnosis not present

## 2022-05-11 NOTE — Progress Notes (Signed)
Subjective:   Patient ID: Donna Watkins, female    DOB: [birth year]     MRN: [id number]    Brief patient profile:  62 yowf  MM/quit smoking in 2013 with severe copd at baseline last seen in pulmonary clinic  [facility]  GOLD IV criteria with PFTs 05/2012 ith fev1 only 26%, diffusion at 52%    History of Present Illness  07/31/2016 62st office visit/    GOLD IV copd/ 02 2lpm 24/7  Chief Complaint  Patient presents with   Pulmonary Consult    Pt is self referred for COPD. Pt c/o DOE, throat clearing. Pt denies CP/tightness anf recentl f/c/s. Pt states she rarely uses albuterol hfa.   doe -  turns up 3lpm walking but still doe x 50 ft Use scooter at [employer] x 5 years  Sleeps on 4lpm / no am ha / cough or congestion  Uses neb avg bid, very poor  insight into meds/02  Some better breathing p neb saba rec  Call us if you want to be referred to [facility] for pulmonary rehab - in meantime continue 2lpm at rest and 3lpm with activity    Please schedule a follow up visit in 3 months but call sooner if needed with full pfts on return      12/31/2016  f/u ov/ re:  2lpm 24/7  Chief Complaint  Patient presents with   Follow-up    review PFT results. States breathing has been ok since last visit.    breathing ok at rest but can't walk 300 ft even on up to 3lpm s sob on flat surface slow pace  rec Stay on alb/ipatropium up to 4 x daily    07/02/2017  f/u ov/ re: GOLD IV  / 02 24/7  Chief Complaint  Patient presents with   Follow-up    Breathing is overall doing well overall. She is using her albuterol inhaler once per wk on average. She uses her albuterol and atrovent 2 x daily on average. She states she has been losing wt unintentionally.    Dyspnea:  Not usually walking more than a few hundred feet on 3lpm / still using scooter at [employer] Cough: not now Sleeping: insomnia no resp complaints  SABA use: about twice dialy duoneb  02: 2lpm x 3lpm  walking   rec Plan A = Automatic = Bevespi Take 2 puffs first thing in am and then another 2 puffs about 12 hours later.  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    Dyspnea:  no longer doing mb  but is shopping ok at [employer]  Cough: none  Sleeping: no resp cc  SABA use: maybe once a day  02: 3lpm hs and daytime does not titrate  Covid status: vax x 4  Dysphagia x one year preceded RT and  already on ppi s overt hb Rec Make sure you check your oxygen saturation  AT  your highest level of activity (not after you stop)   to be sure it stays over 90%   Have Dr Sudie Bailey refer you to a new GI doctor since Dr Darrick Penna has moved     08/18/2021  f/u ov/[place] office/ re: gold iv/ 02 Dep maint on duoneb bid and prn   Chief Complaint  Patient presents with   Follow-up    Heat bothers patients breathing but overall doing well.   Dyspnea:  still shopping [employer]  Cough: none  Sleeping: able to lie down flat/level and 2 pillows  SABA use: rarely needing extra unless over does it 02: 3lpm hs  and during the day  Mild dysphagia with small HH on CT chest 07/14/21  Rec No change in medications needed Please schedule a follow up visit in 6 months but call sooner if needed   Admit date:     03/25/2022  Discharge date: 03/28/22  Discharge Physician: Onalee Hua Tat    PCP: Gareth Morgan, MD    Recommendations at discharge:   Please follow up with primary care provider within 1-2 weeks  Please  repeat BMP and CBC in one week         Hospital Course: 62 year old female with a history of COPD, chronic respiratory failure on 3 L, lung cancer status post XRT, anxiety, hyperlipidemia presenting from home secondary to hypoxia and tachycardia.  The patient states that she had been in her usual state of health until 03/25/2022 when she was working with home health physical therapy.  Apparently, home health PT noted the patient to be hypoxic with ambulation into the 85% on her usual 3 L.  The patient apparently was tachycardic into the 100s.  As result, EMS was activated.  Upon EMS arrival, she was noted to have oxygen saturation in the high 80s.  Show her oxygen was increased to 4 L. The patient herself states that she had been in her usual state of health.  She had denied any fevers, chills, chest pain, worsening cough, hemoptysis, nausea, vomiting, diarrhea, abdominal pain.  She states that her breathing was as same as usual.  She is rather sedentary and sits on the couch most of the day.  She needs assistance with her ADLs.  In the ED, the patient was afebrile and hemodynamically stable.  She was placed on BiPAP.  WBC 11.6, hemoglobin 12.9, platelets 141,000.  Sodium 142, potassium 3.7, bicarbonate >45.  Serum creatinine 0.44.  AST 211, ALT 670, alkaline phosphatase 97, total bilirubin 1.0.  CTA chest was negative for PE.  Showed a 1.9 x 1.4 cmright lower lobe nodule.  The patient was given DuoNebs and IV Solu-Medrol.  She was admitted for acute on chronic respiratory failure.   Assessment and Plan: Acute on chronic respiratory failure with hypoxia and hypercarbia -Secondary to COPD exacerbation -03/25/2022 VBG 7.32/>123/<31/73 -Weaned off continuous BiPAP back to baseline 3 L -she still used BiPAP at night -Pt already has NIV machine at home that she is compliant with   COPD exacerbation -COVID-19/RSV/Flu negative -continuee brovana -continued pulmicort -continued yupleri -03/25/2022 CTA  chest negative for PE -IV solumedrol>>d/c home with prednisone taper   Transaminasemia -Patient is hepatitis A immune -Hepatitis B and C--neg -right upper quadrant ultrasound---Diffuse gallbladder wall calcifications and shadowing consistent with the history of porcelain gallbladder. Evaluation for stones is limited. There is gallbladder wall thickening but no adjacent fluid -  trend LFTs--improving -03/25/2022 CT abdomen--porcelain gallbladder; large stool burden; soft tissue stranding around the sacrum -discussed with general surgery, Dr. 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 Danie Chandler indication presently for operative intervention   Unstageable sacral decubitus ulcer -Wound care consult -There is some mild cellulitis surrounding the wound -appreciate surgery>>debrided -received vanco during hospitalization D/c home with doxy x 4 more days   Anxiety -Continue home dose alprazolam -PDMP reviewed -Patient received Xanax 1 mg, #150 monthly, last refill 02/19/2022   Hypomagnesemia/hypophosphatemia -Repleted   Mixed hyperlipidemia -Holding statin secondary to elevated LFTs   GERD -Continue PPI   Thrombocytopenia -Appears to be chronic -monitor for signs of bleeding   Moderate malnutrition -continue supplements   05/12/2022  f/u ov/[place] office/ re: GOLD 4/ 02 dep  maint on duoneb / bud   Chief Complaint  Patient presents with   Follow-up    Pt f/u states that she is doing well using O2 (3.5-3Lpm) during day and CPAP qhs. No concerns currently   Dyspnea:  back to baseline = MMRC3 = can't walk 100 yards even at a slow pace at a flat grade s stopping due to sob   Cough: none  Sleeping: doing well on cpap  SABA use: none  02: 3lpm    No obvious day to day or daytime variability or assoc excess/ purulent sputum or mucus plugs or  hemoptysis or cp or chest tightness, subjective wheeze or overt sinus or hb symptoms.   Sleeping now  without nocturnal  or early am exacerbation  of respiratory  c/o's or need for noct saba. Also denies any obvious fluctuation of symptoms with weather or environmental changes or other aggravating or alleviating factors except as outlined above   No unusual exposure hx or h/o childhood pna/ asthma or knowledge of premature birth.  Current Allergies, Complete Past Medical History, Past Surgical History, Family History, and Social History were reviewed in [employer] record.  ROS  The following are not active complaints unless bolded Hoarseness, sore throat, dysphagia, dental problems, itching, sneezing,  nasal congestion or discharge of excess mucus or purulent secretions, ear ache,   fever, chills, sweats, unintended wt loss or wt gain, classically pleuritic or exertional cp,  orthopnea pnd or arm/hand swelling  or leg swelling, presyncope, palpitations, abdominal pain, anorexia, nausea, vomiting, diarrhea  or change in bowel habits or change in bladder habits, change in stools or change in urine, dysuria, hematuria,  rash, arthralgias, visual complaints, headache, numbness, weakness or ataxia or problems with walking or coordination,  change in mood or  memory.        Current Meds  Medication Sig   albuterol (PROVENTIL) (2.5 MG/3ML) 0.083% nebulizer solution Take 2.5 mg by nebulization See admin instructions. 2.5 mg by nebulization five times daily   albuterol (VENTOLIN HFA) 108 (90 Base) MCG/ACT inhaler Inhale 2 puffs into the lungs every 4 (four) hours as needed for wheezing or shortness of breath.   ALPRAZolam (XANAX) 1 MG tablet Take 1 tablet (1 mg total) by mouth 3 (three) times daily as needed for anxiety.   atorvastatin (LIPITOR) 20 MG tablet Take 1 tablet (20 mg total) by mouth daily. Restart on 04/03/22   budesonide (PULMICORT) 0.5 MG/2ML nebulizer solution Take 2  mLs (0.5 mg total) by nebulization 2 (two) times daily.   collagenase (SANTYL) 250 UNIT/GM ointment Apply 1 Application topically daily.   feeding  supplement (ENSURE ENLIVE / ENSURE PLUS) LIQD Take 237 mLs by mouth 2 (two) times daily between meals.   ipratropium-albuterol (DUONEB) 0.5-2.5 (3) MG/3ML SOLN Take 3 mLs by nebulization 3 (three) times daily. And as needed for SOB   Multiple Vitamins-Minerals (CENTRUM SILVER PO) Take 1 tablet by mouth daily.   nutrition supplement, JUVEN, (JUVEN) PACK Take 1 packet by mouth 2 (two) times daily between meals.   omeprazole (PRILOSEC) 20 MG capsule Take 20 mg by mouth daily.   OXYGEN Place 3 L into the nose continuous.             Objective:   Physical Exam  Wts  05/12/2022            ***  08/18/2021         112  02/19/2021         114  08/14/2020         120  02/27/2020         116 08/28/2019         107  05/26/2019       106  07/04/2018        129 01/03/2018      126  07/02/2017        128 12/31/2016      136   07/31/16 145 lb 9.6 oz (66 kg)  01/22/14 180 lb (81.6 kg)  05/23/13 180 lb 12.8 oz (82 kg)      01/03/18 126 lb (57.2 kg)  07/02/17 128 lb 9.6 oz (58.3 kg)  02/04/17 130 lb (59 kg)     Vital signs reviewed  05/12/2022  - Note at rest 02 sats  ***% on ***   General appearance:    ***       Mod barr   -   Mod clubbing.***               Assessment & Plan:

## 2022-05-12 ENCOUNTER — Ambulatory Visit (INDEPENDENT_AMBULATORY_CARE_PROVIDER_SITE_OTHER): Payer: Medicare HMO | Admitting: Internal Medicine

## 2022-05-12 ENCOUNTER — Encounter: Payer: Self-pay | Admitting: Internal Medicine

## 2022-05-12 VITALS — BP 118/60 | HR 122 | Ht 63.0 in | Wt 89.2 lb

## 2022-05-12 VITALS — BP 118/60 | HR 122 | Ht 63.0 in | Wt 89.0 lb

## 2022-05-12 DIAGNOSIS — K219 Gastro-esophageal reflux disease without esophagitis: Secondary | ICD-10-CM

## 2022-05-12 DIAGNOSIS — F419 Anxiety disorder, unspecified: Secondary | ICD-10-CM

## 2022-05-12 DIAGNOSIS — Z1329 Encounter for screening for other suspected endocrine disorder: Secondary | ICD-10-CM

## 2022-05-12 DIAGNOSIS — J9612 Chronic respiratory failure with hypercapnia: Secondary | ICD-10-CM

## 2022-05-12 DIAGNOSIS — J9611 Chronic respiratory failure with hypoxia: Secondary | ICD-10-CM

## 2022-05-12 DIAGNOSIS — F324 Major depressive disorder, single episode, in partial remission: Secondary | ICD-10-CM

## 2022-05-12 DIAGNOSIS — C3490 Malignant neoplasm of unspecified part of unspecified bronchus or lung: Secondary | ICD-10-CM

## 2022-05-12 DIAGNOSIS — D696 Thrombocytopenia, unspecified: Secondary | ICD-10-CM

## 2022-05-12 DIAGNOSIS — E782 Mixed hyperlipidemia: Secondary | ICD-10-CM

## 2022-05-12 DIAGNOSIS — E785 Hyperlipidemia, unspecified: Secondary | ICD-10-CM | POA: Diagnosis not present

## 2022-05-12 DIAGNOSIS — R911 Solitary pulmonary nodule: Secondary | ICD-10-CM

## 2022-05-12 DIAGNOSIS — J449 Chronic obstructive pulmonary disease, unspecified: Secondary | ICD-10-CM | POA: Diagnosis not present

## 2022-05-12 DIAGNOSIS — Z0001 Encounter for general adult medical examination with abnormal findings: Secondary | ICD-10-CM | POA: Insufficient documentation

## 2022-05-12 DIAGNOSIS — E538 Deficiency of other specified B group vitamins: Secondary | ICD-10-CM

## 2022-05-12 DIAGNOSIS — E43 Unspecified severe protein-calorie malnutrition: Secondary | ICD-10-CM

## 2022-05-12 DIAGNOSIS — Z79899 Other long term (current) drug therapy: Secondary | ICD-10-CM

## 2022-05-12 DIAGNOSIS — L89159 Pressure ulcer of sacral region, unspecified stage: Secondary | ICD-10-CM

## 2022-05-12 DIAGNOSIS — Z131 Encounter for screening for diabetes mellitus: Secondary | ICD-10-CM

## 2022-05-12 DIAGNOSIS — R627 Adult failure to thrive: Secondary | ICD-10-CM

## 2022-05-12 DIAGNOSIS — Z1321 Encounter for screening for nutritional disorder: Secondary | ICD-10-CM

## 2022-05-12 DIAGNOSIS — D509 Iron deficiency anemia, unspecified: Secondary | ICD-10-CM

## 2022-05-12 NOTE — Patient Instructions (Signed)
It was a pleasure to see you today.  Thank you for giving [place] the opportunity to be involved in your care.  Below is a brief recap of your visit and next steps.  We will plan to see you again in 3 months.  Summary You have established care today We will check basic labs and request records from Dr. Michelle Nasuti office Follow up in 3 months

## 2022-05-12 NOTE — Patient Instructions (Signed)
No change in medications  Make sure you check your oxygen saturation  AT  your highest level of activity (not after you stop)   to be sure it stays over 90% and adjust  02 flow upward to maintain this level if needed but remember to turn it back to previous settings when you stop (to conserve your supply).    Please schedule a follow up visit in 3 months but call sooner if needed  

## 2022-05-12 NOTE — Assessment & Plan Note (Signed)
Extensive history of anxiety that is currently managed with Xanax 1 mg 3-5 times daily as needed for anxiety relief.  This has most recently been managed by her PCP. -I expressed my concern for chronic benzodiazepine use, particularly in a patient with multiple chronic medical conditions.  I recommended that she establish care with a psychiatrist for future management.  She is in agreement with this plan.  Psychiatry referral has been placed today. Will fill Xanax until she is able to establish care with psychiatry.

## 2022-05-12 NOTE — Assessment & Plan Note (Signed)
Symptoms are currently well-controlled with omeprazole 20 mg daily. -No medication changes today

## 2022-05-12 NOTE — Assessment & Plan Note (Signed)
Previously documented history of end-stage COPD.  She is followed by pulmonology (Dr. Sherene Sires).  Currently prescribed Pulmicort and alternating DuoNebs/albuterol nebulizer treatments that she completes 3 times daily.  She has an albuterol rescue inhaler that she seldom requires.  Pulmonary exam today is unremarkable. -Pulmonology follow-up scheduled for this afternoon

## 2022-05-12 NOTE — Assessment & Plan Note (Signed)
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  Recently, surveillance imaging has noted a progressively enlarging RLL nodule.  She will start SBRT on 5/14. -Followed by oncology with [facility] in [place]

## 2022-05-12 NOTE — Assessment & Plan Note (Signed)
Presenting today to establish care.  Available records and labs have been reviewed. -Repeat labs ordered today -Records from her previous PCP have been requested -Psychiatry referral placed -We will tentatively plan for follow-up in 3 months

## 2022-05-12 NOTE — Progress Notes (Signed)
New Patient Office Visit  Subjective    Patient ID: [name], female    DOB: 03-11-1960  Age: 62 y.o. MRN: [id number]  CC:  Chief Complaint  Patient presents with   Establish Care    HPI Donna Watkins presents to establish care.  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  She has most recently been followed by Dr. Sudie Bailey.  Donna Watkins reports feeling well today.  She is asymptomatic and has no acute concerns to discuss aside from wishing to establish care.  She is currently disabled due to COPD and anxiety.  She endorses former tobacco use, quitting in 2013, but denies alcohol and illicit drug use.  Her family medical history is significant for CAD, HTN, and COPD.  Chronic medical conditions and outstanding preventative care items discussed today are individually addressed in A/P below.  Outpatient Encounter Medications as of 05/12/2022  Medication Sig   albuterol (PROVENTIL) (2.5 MG/3ML) 0.083% nebulizer solution Take 2.5 mg by nebulization See admin instructions. 2.5 mg by nebulization five times daily   albuterol (VENTOLIN HFA) 108 (90 Base) MCG/ACT inhaler Inhale 2 puffs into the lungs every 4 (four) hours as needed for wheezing or shortness of breath.   ALPRAZolam (XANAX) 1 MG tablet Take 1 tablet (1 mg total) by mouth 3 (three) times daily as needed for anxiety.   atorvastatin (LIPITOR) 20 MG tablet Take 1 tablet (20 mg total) by mouth daily. Restart on 04/03/22   budesonide (PULMICORT) 0.5 MG/2ML nebulizer solution Take 2 mLs (0.5 mg total) by nebulization 2 (two) times daily.   collagenase (SANTYL) 250 UNIT/GM ointment Apply 1 Application topically daily.   feeding supplement (ENSURE ENLIVE / ENSURE PLUS) LIQD Take 237 mLs by  mouth 2 (two) times daily between meals.   ipratropium-albuterol (DUONEB) 0.5-2.5 (3) MG/3ML SOLN Take 3 mLs by nebulization 3 (three) times daily. And as needed for SOB   Multiple Vitamins-Minerals (CENTRUM SILVER PO) Take 1 tablet by mouth daily.   nutrition supplement, JUVEN, (JUVEN) PACK Take 1 packet by mouth 2 (two) times daily between meals.   omeprazole (PRILOSEC) 20 MG capsule Take 20 mg by mouth daily.   OXYGEN Place 3 L into the nose continuous.   No facility-administered encounter medications on file as of 05/12/2022.    Past Medical History:  Diagnosis Date   Anxiety    panic attacks   Cancer (HCC)    COPD (chronic obstructive pulmonary disease) (HCC) 03/01/2012   Depression    Emphysema of lung (HCC)    GERD (gastroesophageal reflux disease)    History of radiation therapy    Left Lung- 12/09/20-12/19/20- Dr. Antony Blackbird   Hyperlipidemia    Mild scoliosis    Oxygen deficiency    Oxygen dependent     Past Surgical History:  Procedure Laterality Date   BRONCHIAL BIOPSY  11/04/2020   Procedure: BRONCHIAL BIOPSIES;  Surgeon: Leslye Peer, MD;  Location: [facility] ENDOSCOPY;  Service: Pulmonary;;   BRONCHIAL BRUSHINGS  11/04/2020   Procedure: BRONCHIAL BRUSHINGS;  Surgeon: Leslye Peer, MD;  Location: [facility] ENDOSCOPY;  Service: Pulmonary;;   BRONCHIAL NEEDLE ASPIRATION BIOPSY  11/04/2020   Procedure: BRONCHIAL NEEDLE ASPIRATION BIOPSIES;  Surgeon: Leslye Peer, MD;  Location: [facility] ENDOSCOPY;  Service:  Pulmonary;;   COLONOSCOPY N/A 09/09/2012   Procedure: COLONOSCOPY;  Surgeon: West Bali, MD;  Location: [facility] ENDO SUITE;  Service: Endoscopy;  Laterality: N/A;  1:15-moved to 12:45 Melanie notified pt   FIDUCIAL MARKER PLACEMENT  11/04/2020   Procedure: FIDUCIAL MARKER PLACEMENT;  Surgeon: Leslye Peer, MD;  Location: [facility] ENDOSCOPY;  Service: Pulmonary;;   TUBAL LIGATION     VIDEO BRONCHOSCOPY WITH ENDOBRONCHIAL NAVIGATION N/A 11/04/2020   Procedure: ROBOTIC  VIDEO  BRONCHOSCOPY WITH ENDOBRONCHIAL NAVIGATION;  Surgeon: Leslye Peer, MD;  Location: [facility] ENDOSCOPY;  Service: Pulmonary;  Laterality: N/A;    Family History  Problem Relation Age of Onset   Heart disease Father    COPD Mother    Arthritis Sister    Colon cancer Neg Hx     Social History   Socioeconomic History   Marital status: Divorced    Spouse name: Not on file   Number of children: Not on file   Years of education: Not on file   Highest education level: Not on file  Occupational History   Occupation: Disabled  Tobacco Use   Smoking status: Former    Packs/day: 2.50    Years: 30.00    Additional pack years: 0.00    Total pack years: 75.00    Types: Cigarettes    Quit date: 06/12/2011    Years since quitting: 10.9   Smokeless tobacco: Never  Vaping Use   Vaping Use: Never used  Substance and Sexual Activity   Alcohol use: No   Drug use: No   Sexual activity: Not Currently    Birth control/protection: Post-menopausal, Surgical    Comment: tubal ligation  Other Topics Concern   Not on file  Social History Narrative   Disability.    Lives with sister   Social Determinants of Health   Financial Resource Strain: Low Risk  (07/30/2021)   Overall Financial Resource Strain (CARDIA)    Difficulty of Paying Living Expenses: Not very hard  Food Insecurity: Food Insecurity Present (04/29/2022)   Hunger Vital Sign    Worried About Running Out of Food in the Last Year: Sometimes true    Ran Out of Food in the Last Year: Sometimes true  Transportation Needs: No Transportation Needs (04/29/2022)   PRAPARE - Administrator, Civil Service (Medical): No    Lack of Transportation (Non-Medical): No  Physical Activity: Sufficiently Active (07/30/2021)   Exercise Vital Sign    Days of Exercise per Week: 7 days    Minutes of Exercise per Session: 30 min  Stress: No Stress Concern Present (07/30/2021)   [employer] of Occupational Health - Occupational Stress  Questionnaire    Feeling of Stress : Only a little  Social Connections: Socially Isolated (07/30/2021)   Social Connection and Isolation Panel [NHANES]    Frequency of Communication with Friends and Family: Three times a week    Frequency of Social Gatherings with Friends and Family: Never    Attends Religious Services: Never    Database administrator or Organizations: No    Attends Banker Meetings: Never    Marital Status: Divorced  Catering manager Violence: Not At Risk (04/29/2022)   Humiliation, Afraid, Rape, and Kick questionnaire    Fear of Current or Ex-Partner: No    Emotionally Abused: No    Physically Abused: No    Sexually Abused: No    Review of Systems  Constitutional:  Negative for chills and fever.  HENT:  Negative for sore throat.   Respiratory:  Negative for cough and shortness of breath.   Cardiovascular:  Negative for chest pain, palpitations and leg swelling.  Gastrointestinal:  Negative for abdominal pain, blood in stool, constipation, diarrhea, nausea and vomiting.  Genitourinary:  Negative for dysuria and hematuria.  Musculoskeletal:  Negative for myalgias.  Skin:  Negative for itching and rash.  Neurological:  Negative for dizziness and headaches.  Psychiatric/Behavioral:  Negative for depression and suicidal ideas.         Objective    BP 118/60   Pulse (!) 122   Ht 5\' 3"  (1.6 m)   Wt 89 lb 3.2 oz (40.5 kg)   LMP 05/06/2010   SpO2 90% Comment: 2L  BMI 15.80 kg/m   Physical Exam Vitals reviewed.  Constitutional:      General: She is not in acute distress.    Appearance: She is ill-appearing. She is not toxic-appearing.  HENT:     Head: Normocephalic and atraumatic.     Right Ear: External ear normal.     Left Ear: External ear normal.     Nose: Nose normal. No congestion or rhinorrhea.     Mouth/Throat:     Mouth: Mucous membranes are moist.     Pharynx: Oropharynx is clear. No oropharyngeal exudate or posterior  oropharyngeal erythema.  Eyes:     General: No scleral icterus.    Extraocular Movements: Extraocular movements intact.     Conjunctiva/sclera: Conjunctivae normal.     Pupils: Pupils are equal, round, and reactive to light.  Cardiovascular:     Rate and Rhythm: Normal rate and regular rhythm.     Pulses: Normal pulses.     Heart sounds: Normal heart sounds. No murmur heard.    No friction rub. No gallop.  Pulmonary:     Effort: Pulmonary effort is normal.     Breath sounds: Normal breath sounds. No wheezing, rhonchi or rales.     Comments: Examined on 2 L [place] Abdominal:     General: Abdomen is flat. Bowel sounds are normal. There is no distension.     Palpations: Abdomen is soft.     Tenderness: There is no abdominal tenderness.  Musculoskeletal:        General: No swelling. Normal range of motion.     Cervical back: Normal range of motion.     Right lower leg: No edema.     Left lower leg: No edema.  Lymphadenopathy:     Cervical: No cervical adenopathy.  Skin:    General: Skin is warm and dry.     Capillary Refill: Capillary refill takes less than 2 seconds.     Coloration: Skin is not jaundiced.     Findings: Lesion (Sacral wound dressing clean, dry, and intact) present.  Neurological:     General: No focal deficit present.     Mental Status: She is alert and oriented to person, place, and time.  Psychiatric:        Mood and Affect: Mood normal.        Behavior: Behavior normal.   Last CBC Lab Results  Component Value Date   WBC 10.7 (H) 03/28/2022   HGB 10.5 (L) 03/28/2022   HCT 35.2 (L) 03/28/2022   MCV 107.6 (H) 03/28/2022   MCH 32.1 03/28/2022   RDW 11.9 03/28/2022   PLT 145 (L) 03/28/2022   Last metabolic panel Lab Results  Component Value Date   GLUCOSE 90 03/28/2022  NA 140 03/28/2022   K 4.5 03/28/2022   CL 83 (L) 03/28/2022   CO2 >45 (H) 03/28/2022   BUN 22 (H) 03/28/2022   CREATININE <0.30 (L) 03/28/2022   GFRNONAA NOT CALCULATED 03/28/2022    CALCIUM 8.2 (L) 03/28/2022   PHOS 2.3 (L) 03/28/2022   PROT 5.0 (L) 03/28/2022   ALBUMIN 2.3 (L) 03/28/2022   BILITOT 0.6 03/28/2022   ALKPHOS 68 03/28/2022   AST 27 03/28/2022   ALT 228 (H) 03/28/2022   ANIONGAP NOT CALCULATED 03/28/2022   Last thyroid functions Lab Results  Component Value Date   TSH 0.428 06/13/2011   Last vitamin B12 and Folate Lab Results  Component Value Date   VITAMINB12 262 11/26/2021   FOLATE 5.4 (L) 11/26/2021   Assessment & Plan:   Problem List Items Addressed This Visit       Chronic respiratory failure with hypoxia and hypercapnia (HCC) (Chronic)    Baseline O2 requirement of 2 L [place] in the setting of end-stage COPD.      End stage COPD [facility])    Previously documented history of end-stage COPD.  She is followed by pulmonology (Dr. Sherene Sires).  Currently prescribed Pulmicort and alternating DuoNebs/albuterol nebulizer treatments that she completes 3 times daily.  She has an albuterol rescue inhaler that she seldom requires.  Pulmonary exam today is unremarkable. -Pulmonology follow-up scheduled for this afternoon      Squamous cell carcinoma of lung, stage I, unspecified laterality (HCC)    History of SCC of the L UL, treated with SBRT in December 2022.  Recently, surveillance imaging has noted a progressively enlarging RLL nodule.  She will start SBRT on 5/14. -Followed by oncology with [facility] in [place]      GERD (gastroesophageal reflux disease)    Symptoms are currently well-controlled with omeprazole 20 mg daily. -No medication changes today      Mixed hyperlipidemia    Currently prescribed atorvastatin 20 mg daily. -Repeat lipid panel ordered today      Anxiety - Primary    Extensive history of anxiety that is currently managed with Xanax 1 mg 3-5 times daily as needed for anxiety relief.  This has most recently been managed by her PCP. -I expressed my concern for chronic benzodiazepine use, particularly in a patient with multiple  chronic medical conditions.  I recommended that she establish care with a psychiatrist for future management.  She is in agreement with this plan.  Psychiatry referral has been placed today. Will fill Xanax until she is able to establish care with psychiatry.      Protein-calorie malnutrition, severe    Previously documented history of severe protein calorie malnutrition and FTT in adult.  She is drinking Ensure twice daily. -Baseline labs ordered today      Sacral decubitus ulcer    Sacral wound dressing is clean, dry, and intact today.  She is followed by wound care and has an appointment scheduled for tomorrow (5/8).      Encounter for general adult medical examination with abnormal findings    Presenting today to establish care.  Available records and labs have been reviewed. -Repeat labs ordered today -Records from her previous PCP have been requested -Psychiatry referral placed -We will tentatively plan for follow-up in 3 months      Return in about 3 months (around 08/12/2022).   Billie Lade, MD

## 2022-05-12 NOTE — Assessment & Plan Note (Signed)
Baseline O2 requirement of 2 L [place] in the setting of end-stage COPD.

## 2022-05-12 NOTE — Assessment & Plan Note (Addendum)
Previously documented history of severe protein calorie malnutrition and FTT in adult.  She is drinking Ensure twice daily. -Baseline labs ordered today

## 2022-05-12 NOTE — Assessment & Plan Note (Signed)
Currently prescribed atorvastatin 20 mg daily.  Repeat lipid panel ordered today. 

## 2022-05-12 NOTE — Assessment & Plan Note (Signed)
Sacral wound dressing is clean, dry, and intact today.  She is followed by wound care and has an appointment scheduled for tomorrow (5/8).

## 2022-05-13 LAB — B12 AND FOLATE PANEL
Folate: >20 ng/mL (ref >3.0)
Vitamin B-12: 1147 pg/mL (ref 232–1245)

## 2022-05-13 LAB — CBC WITH DIFFERENTIAL/PLATELET
Basophils Absolute: 0 10*3/uL (ref 0.0–0.2)
Basos: 1 %
EOS (ABSOLUTE): 0.1 10*3/uL (ref 0.0–0.4)
Eos: 2 %
Hematocrit: 36.3 % (ref 34.0–46.6)
Hemoglobin: 11.4 g/dL (ref 11.1–15.9)
Immature Grans (Abs): 0 10*3/uL (ref 0.0–0.1)
Immature Granulocytes: 0 %
Lymphocytes Absolute: 1.1 10*3/uL (ref 0.7–3.1)
Lymphs: 18 %
MCH: 30 pg (ref 26.6–33.0)
MCHC: 31.4 g/dL — ABNORMAL LOW (ref 31.5–35.7)
MCV: 96 fL (ref 79–97)
Monocytes Absolute: 0.2 10*3/uL (ref 0.1–0.9)
Monocytes: 4 %
Neutrophils Absolute: 4.5 10*3/uL (ref 1.4–7.0)
Neutrophils: 75 %
Platelets: 268 10*3/uL (ref 150–450)
RBC: 3.8 x10E6/uL (ref 3.77–5.28)
RDW: 11.6 % — ABNORMAL LOW (ref 11.7–15.4)
WBC: 5.9 10*3/uL (ref 3.4–10.8)

## 2022-05-13 LAB — IRON,TIBC AND FERRITIN PANEL
Ferritin: 257 ng/mL — ABNORMAL HIGH (ref 15–150)
Iron Saturation: 29 % (ref 15–55)
Iron: 83 ug/dL (ref 27–159)
Total Iron Binding Capacity: 288 ug/dL (ref 250–450)
UIBC: 205 ug/dL (ref 131–425)

## 2022-05-13 LAB — HEMOGLOBIN A1C
Est. average glucose Bld gHb Est-mCnc: 108 mg/dL
Hgb A1c MFr Bld: 5.4 % (ref 4.8–5.6)

## 2022-05-13 LAB — LIPID PANEL
Chol/HDL Ratio: 2.8 ratio (ref 0.0–4.4)
Cholesterol, Total: 157 mg/dL (ref 100–199)
HDL: 56 mg/dL (ref >39)
LDL Chol Calc (NIH): 82 mg/dL (ref 0–99)
Triglycerides: 107 mg/dL (ref 0–149)
VLDL Cholesterol Cal: 19 mg/dL (ref 5–40)

## 2022-05-13 LAB — CMP14+EGFR
ALT: 16 IU/L (ref 0–32)
AST: 28 IU/L (ref 0–40)
Albumin/Globulin Ratio: 2.1 (ref 1.2–2.2)
Albumin: 4 g/dL (ref 3.8–4.9)
Alkaline Phosphatase: 89 IU/L (ref 44–121)
BUN/Creatinine Ratio: 30 — ABNORMAL HIGH (ref 12–28)
BUN: 12 mg/dL (ref 8–27)
Bilirubin Total: 0.3 mg/dL (ref 0.0–1.2)
CO2: 33 mmol/L — ABNORMAL HIGH (ref 20–29)
Calcium: 9.2 mg/dL (ref 8.7–10.3)
Chloride: 97 mmol/L (ref 96–106)
Creatinine, Ser: 0.4 mg/dL — ABNORMAL LOW (ref 0.57–1.00)
Globulin, Total: 1.9 g/dL (ref 1.5–4.5)
Glucose: 88 mg/dL (ref 70–99)
Potassium: 3.8 mmol/L (ref 3.5–5.2)
Sodium: 143 mmol/L (ref 134–144)
Total Protein: 5.9 g/dL — ABNORMAL LOW (ref 6.0–8.5)
eGFR: 113 mL/min/{1.73_m2} (ref >59)

## 2022-05-13 LAB — TSH+FREE T4
Free T4: 1.1 ng/dL (ref 0.82–1.77)
TSH: 3.34 u[IU]/mL (ref 0.450–4.500)

## 2022-05-13 LAB — VITAMIN D 25 HYDROXY (VIT D DEFICIENCY, FRACTURES): Vit D, 25-Hydroxy: 47.4 ng/mL (ref 30.0–100.0)

## 2022-05-13 NOTE — Assessment & Plan Note (Addendum)
Quit smoking 2013 6/13 biapical nodular scarring - stable 12/13 Reduced size of RUL on CT 11/14 makes malignancy unlikely 11/16/2013 CT chest >Stable 3 mm nodule in right upper lobe  - CT chest 07/31/20 new  LUL nodule abutting pleura but not involving t spine - PET 08/14/2020 >>  POS > BX Sq cell ca > RT complete 12/2020 and f/u per ONC / RT    See CTa 03/2022 > planning SRT per Dr Roselind Messier

## 2022-05-13 NOTE — Assessment & Plan Note (Addendum)
07/31/2016  Patient Saturations on Room Air at Rest = 88% and while Ambulating = 83% - HC03  07/31/2016  = 38  - 07/31/2016   Walked 3lpm  2 laps @ 185 ft each stopped due to  Sob at moderate pace with no desat  - RA  = 85% 01/03/2018 corrected to 92 on 2lpm continuous  -  05/26/2019   Walked 2lpm cont x  approx   200 ft  @ moderate pace  stopped due to  Sob with sats still 91%   -  08/28/2019   Walked 2.5 lpm  approx   400 ft  @ moderate pace  stopped due to  Sob with sats still   90% - 02 sats at rst 85% RA 02/27/2020  - on ? Cpap hs plus 3lpm as of 05/12/2022 > download requested as epic records suggest bipap/ NIV - 05/12/2022   HC03 = 33  (marked improvement vs admit)    Whatever she's on at this point is fine though not clear really what lead to last admit or whether it could have been prevented by NIV chronically  > records requested   Advised: Make sure you check your oxygen saturation  AT  your highest level of activity (not after you stop)   to be sure it stays in low 90s and adjust  02 flow upward to maintain this level if needed but remember to turn it back to previous settings when you stop (to conserve your supply).   Late add: she is on NIV as of 02/10/22 per adapt > no change rx needed          Each maintenance medication was reviewed in detail including emphasizing most importantly the difference between maintenance and prns and under what circumstances the prns are to be triggered using an action plan format where appropriate.  Total time for H and P, chart review, counseling, reviewing  /02/neb  device(s) and generating customized AVS unique to this office visit / same day charting > 30 min post hosp f/u

## 2022-05-13 NOTE — Assessment & Plan Note (Signed)
Quit smoking 2013/MM - PFT's  04/27/12  FEV1 0.70 (26 %) ratio 35  p no % improvement from saba p ? prior to study with DLCO  53 % corrects to 69 % for alv volume with classic curvature   - Alpha one AT screening   07/31/16 >   MM - PFT's  12/31/2016  FEV1 0.74 (28 % ) ratio 38  p no % improvement from saba p saba 4h   prior to study with DLCO  37/38c % corrects to 50 % for alv volume   - 07/02/2017   try bevespi: did not use correctly  - 01/03/2018    try bevespi again > could not afford and could not tell really helped vs duoneb - 05/26/2019  After extensive coaching inhaler device,  effectiveness =    90% with smi > try stiolto> could not afford it on her plan >   - as of 08/14/2020 maint on duoneb qid   Well compensated but very severe copd, nothing else to offer at this point

## 2022-05-19 ENCOUNTER — Ambulatory Visit
Admission: RE | Admit: 2022-05-19 | Discharge: 2022-05-19 | Disposition: A | Discharge status: Home / Self Care | Payer: Medicare HMO | Source: Ambulatory Visit | Attending: Radiation Oncology | Admitting: Radiation Oncology

## 2022-05-19 ENCOUNTER — Other Ambulatory Visit: Payer: Self-pay

## 2022-05-19 DIAGNOSIS — C3490 Malignant neoplasm of unspecified part of unspecified bronchus or lung: Secondary | ICD-10-CM

## 2022-05-19 DIAGNOSIS — C3431 Malignant neoplasm of lower lobe, right bronchus or lung: Secondary | ICD-10-CM | POA: Diagnosis not present

## 2022-05-19 LAB — RAD ONC ARIA SESSION SUMMARY
Course Elapsed Days: 0
Plan Fractions Treated to Date: 1
Plan Prescribed Dose Per Fraction: 18 Gy
Plan Total Fractions Prescribed: 3
Plan Total Prescribed Dose: 54 Gy
Reference Point Dosage Given to Date: 18 Gy
Reference Point Session Dosage Given: 18 Gy
Session Number: 1

## 2022-05-21 ENCOUNTER — Other Ambulatory Visit: Payer: Self-pay

## 2022-05-21 ENCOUNTER — Ambulatory Visit
Admission: RE | Admit: 2022-05-21 | Discharge: 2022-05-21 | Disposition: A | Discharge status: Home / Self Care | Payer: Medicare HMO | Source: Ambulatory Visit | Attending: Radiation Oncology | Admitting: Radiation Oncology

## 2022-05-21 DIAGNOSIS — C3492 Malignant neoplasm of unspecified part of left bronchus or lung: Secondary | ICD-10-CM

## 2022-05-21 DIAGNOSIS — C3431 Malignant neoplasm of lower lobe, right bronchus or lung: Secondary | ICD-10-CM | POA: Diagnosis not present

## 2022-05-21 LAB — RAD ONC ARIA SESSION SUMMARY
Course Elapsed Days: 2
Plan Fractions Treated to Date: 2
Plan Prescribed Dose Per Fraction: 18 Gy
Plan Total Fractions Prescribed: 3
Plan Total Prescribed Dose: 54 Gy
Reference Point Dosage Given to Date: 36 Gy
Reference Point Session Dosage Given: 18 Gy
Session Number: 2

## 2022-05-26 ENCOUNTER — Other Ambulatory Visit: Payer: Self-pay

## 2022-05-26 ENCOUNTER — Ambulatory Visit
Admission: RE | Admit: 2022-05-26 | Discharge: 2022-05-26 | Disposition: A | Discharge status: Home / Self Care | Payer: Medicare HMO | Source: Ambulatory Visit | Attending: Radiation Oncology | Admitting: Radiation Oncology

## 2022-05-26 DIAGNOSIS — C3431 Malignant neoplasm of lower lobe, right bronchus or lung: Secondary | ICD-10-CM | POA: Diagnosis not present

## 2022-05-26 DIAGNOSIS — C3492 Malignant neoplasm of unspecified part of left bronchus or lung: Secondary | ICD-10-CM

## 2022-05-26 LAB — RAD ONC ARIA SESSION SUMMARY
Course Elapsed Days: 7
Plan Fractions Treated to Date: 3
Plan Prescribed Dose Per Fraction: 18 Gy
Plan Total Fractions Prescribed: 3
Plan Total Prescribed Dose: 54 Gy
Reference Point Dosage Given to Date: 54 Gy
Reference Point Session Dosage Given: 18 Gy
Session Number: 3

## 2022-05-29 NOTE — Radiation Completion Notes (Signed)
Patient Name: Donna Watkins, Donna Watkins MRN: [id number] Date of Birth: July 12, 1960 Referring Physician: Towana Badger, M.D. Date of Service: 2022-05-29 Radiation Oncologist: Arnette Schaumann, M.D. [facility]                             RADIATION ONCOLOGY END OF TREATMENT NOTE     Diagnosis: C34.31 Malignant neoplasm of lower lobe, right bronchus or lung Staging on 2020-11-04: Squamous carcinoma of lung, left (HCC) T=cT1, N=cN0, M=cM0 Intent: Curative     ==========DELIVERED PLANS==========  First Treatment Date: 2022-05-19 - Last Treatment Date: 2022-05-26   Plan Name: Lung_RLL_SBRT Site: Lung, Right Technique: SBRT/SRT-IMRT Mode: Photon Dose Per Fraction: 18 Gy Prescribed Dose (Delivered / Prescribed): 54 Gy / 54 Gy Prescribed Fxs (Delivered / Prescribed): 3 / 3     ==========ON TREATMENT VISIT DATES========== 2022-05-19, 2022-05-21, 2022-05-26, 2022-05-26     ==========UPCOMING VISITS==========       ==========APPENDIX - ON TREATMENT VISIT NOTES==========   See weekly On Treatment Notes is Epic for details.

## 2022-06-08 ENCOUNTER — Ambulatory Visit (HOSPITAL_COMMUNITY)
Admission: RE | Admit: 2022-06-08 | Discharge: 2022-06-08 | Disposition: A | Discharge status: Home / Self Care | Payer: Medicare HMO | Source: Ambulatory Visit | Attending: Family Medicine | Admitting: Family Medicine

## 2022-06-08 ENCOUNTER — Encounter (HOSPITAL_COMMUNITY): Payer: Self-pay

## 2022-06-08 DIAGNOSIS — Z1231 Encounter for screening mammogram for malignant neoplasm of breast: Secondary | ICD-10-CM | POA: Insufficient documentation

## 2022-06-11 ENCOUNTER — Other Ambulatory Visit (HOSPITAL_COMMUNITY): Payer: Self-pay | Admitting: Family Medicine

## 2022-06-11 DIAGNOSIS — R928 Other abnormal and inconclusive findings on diagnostic imaging of breast: Secondary | ICD-10-CM

## 2022-06-16 ENCOUNTER — Encounter (HOSPITAL_COMMUNITY): Payer: Self-pay

## 2022-06-16 ENCOUNTER — Ambulatory Visit (HOSPITAL_COMMUNITY)
Admission: RE | Admit: 2022-06-16 | Discharge: 2022-06-16 | Disposition: A | Discharge status: Home / Self Care | Payer: Medicare HMO | Source: Ambulatory Visit | Attending: Family Medicine | Admitting: Family Medicine

## 2022-06-16 DIAGNOSIS — R928 Other abnormal and inconclusive findings on diagnostic imaging of breast: Secondary | ICD-10-CM | POA: Insufficient documentation

## 2022-06-17 ENCOUNTER — Other Ambulatory Visit (HOSPITAL_COMMUNITY): Payer: Self-pay | Admitting: Family Medicine

## 2022-06-17 DIAGNOSIS — R928 Other abnormal and inconclusive findings on diagnostic imaging of breast: Secondary | ICD-10-CM

## 2022-06-23 ENCOUNTER — Ambulatory Visit (INDEPENDENT_AMBULATORY_CARE_PROVIDER_SITE_OTHER): Payer: Medicare HMO

## 2022-06-23 VITALS — Ht 63.0 in | Wt 98.0 lb

## 2022-06-23 DIAGNOSIS — Z0000 Encounter for general adult medical examination without abnormal findings: Secondary | ICD-10-CM

## 2022-06-23 NOTE — Progress Notes (Signed)
 Subjective:   Donna Watkins is a 62 y.o. female who presents for an Initial Medicare Annual Wellness Visit.  Visit Complete: Virtual  I connected with  Emeterio Reeve on 06/23/22 by a audio enabled telemedicine application and verified that I am speaking with the correct person using two identifiers.  Patient Location: Home  Provider Location: Home Office  I discussed the limitations of evaluation and management by telemedicine. The patient expressed understanding and agreed to proceed.  Patient Medicare AWV questionnaire was completed by the patient on 06/20/2022; I have confirmed that all information answered by patient is correct and no changes since this date.  Review of Systems     Cardiac Risk Factors include: sedentary lifestyle;Other (see comment), Risk factor comments: copd, emphysema, hx of respiratory failure, hypoxia     Objective:    Today's Vitals   06/23/22 1100  Weight: 98 lb (44.5 kg)  Height: 5\' 3"  (1.6 m)   Body mass index is 17.36 kg/m.     06/23/2022   11:07 AM 04/29/2022   11:08 AM 03/26/2022    5:06 PM 03/25/2022    4:01 PM 02/05/2022    9:10 PM 11/25/2021    7:49 PM 07/21/2021    3:50 PM  Advanced Directives  Does Patient Have a Medical Advance Directive? No No  No No No No  Would patient like information on creating a medical advance directive? No - Patient declined  Yes (Inpatient - patient requests chaplain consult to create a medical advance directive)  No - Patient declined No - Patient declined No - Patient declined    Current Medications (verified) Outpatient Encounter Medications as of 06/23/2022  Medication Sig   albuterol (PROVENTIL) (2.5 MG/3ML) 0.083% nebulizer solution Take 2.5 mg by nebulization See admin instructions. 2.5 mg by nebulization five times daily   albuterol (VENTOLIN HFA) 108 (90 Base) MCG/ACT inhaler Inhale 2 puffs into the lungs every 4 (four) hours as needed for wheezing or shortness of breath.   ALPRAZolam  (XANAX) 1 MG tablet Take 1 tablet (1 mg total) by mouth 3 (three) times daily as needed for anxiety.   atorvastatin (LIPITOR) 20 MG tablet Take 1 tablet (20 mg total) by mouth daily. Restart on 04/03/22   budesonide (PULMICORT) 0.5 MG/2ML nebulizer solution Take 2 mLs (0.5 mg total) by nebulization 2 (two) times daily.   collagenase (SANTYL) 250 UNIT/GM ointment Apply 1 Application topically daily.   feeding supplement (ENSURE ENLIVE / ENSURE PLUS) LIQD Take 237 mLs by mouth 2 (two) times daily between meals.   ipratropium-albuterol (DUONEB) 0.5-2.5 (3) MG/3ML SOLN Take 3 mLs by nebulization 3 (three) times daily. And as needed for SOB   Multiple Vitamins-Minerals (CENTRUM SILVER PO) Take 1 tablet by mouth daily.   nutrition supplement, JUVEN, (JUVEN) PACK Take 1 packet by mouth 2 (two) times daily between meals.   omeprazole (PRILOSEC) 20 MG capsule Take 20 mg by mouth daily.   OXYGEN Place 3 L into the nose continuous.   No facility-administered encounter medications on file as of 06/23/2022.    Allergies (verified) Patient has no known allergies.   History: Past Medical History:  Diagnosis Date   Anxiety    panic attacks   Cancer (HCC)    COPD (chronic obstructive pulmonary disease) (HCC) 03/01/2012   Depression    Emphysema of lung (HCC)    GERD (gastroesophageal reflux disease)    History of radiation therapy    Left Lung- 12/09/20-12/19/20- Dr. Antony Blackbird  Hyperlipidemia    Mild scoliosis    Oxygen deficiency    Oxygen dependent    Past Surgical History:  Procedure Laterality Date   BRONCHIAL BIOPSY  11/04/2020   Procedure: BRONCHIAL BIOPSIES;  Surgeon: Leslye Peer, MD;  Location: [facility] ENDOSCOPY;  Service: Pulmonary;;   BRONCHIAL BRUSHINGS  11/04/2020   Procedure: BRONCHIAL BRUSHINGS;  Surgeon: Leslye Peer, MD;  Location: [facility] ENDOSCOPY;  Service: Pulmonary;;   BRONCHIAL NEEDLE ASPIRATION BIOPSY  11/04/2020   Procedure: BRONCHIAL NEEDLE ASPIRATION BIOPSIES;   Surgeon: Leslye Peer, MD;  Location: [facility] ENDOSCOPY;  Service: Pulmonary;;   COLONOSCOPY N/A 09/09/2012   Procedure: COLONOSCOPY;  Surgeon: West Bali, MD;  Location: [facility] ENDO SUITE;  Service: Endoscopy;  Laterality: N/A;  1:15-moved to 12:45 Melanie notified pt   FIDUCIAL MARKER PLACEMENT  11/04/2020   Procedure: FIDUCIAL MARKER PLACEMENT;  Surgeon: Leslye Peer, MD;  Location: [facility] ENDOSCOPY;  Service: Pulmonary;;   TUBAL LIGATION     VIDEO BRONCHOSCOPY WITH ENDOBRONCHIAL NAVIGATION N/A 11/04/2020   Procedure: ROBOTIC  VIDEO BRONCHOSCOPY WITH ENDOBRONCHIAL NAVIGATION;  Surgeon: Leslye Peer, MD;  Location: [facility] ENDOSCOPY;  Service: Pulmonary;  Laterality: N/A;   Family History  Problem Relation Age of Onset   Heart disease Father    COPD Mother    Anxiety disorder Mother    Arthritis Sister    Colon cancer Neg Hx    Social History   Socioeconomic History   Marital status: Divorced    Spouse name: Not on file   Number of children: Not on file   Years of education: Not on file   Highest education level: Not on file  Occupational History   Occupation: Disabled  Tobacco Use   Smoking status: Former    Packs/day: 2.50    Years: 30.00    Additional pack years: 0.00    Total pack years: 75.00    Types: Cigarettes    Quit date: 06/12/2011    Years since quitting: 11.0   Smokeless tobacco: Never  Vaping Use   Vaping Use: Never used  Substance and Sexual Activity   Alcohol use: No   Drug use: No   Sexual activity: Not Currently    Birth control/protection: Post-menopausal, Surgical    Comment: tubal ligation  Other Topics Concern   Not on file  Social History Narrative   Disability.    Lives with sister   Social Determinants of Health   Financial Resource Strain: Low Risk  (06/23/2022)   Overall Financial Resource Strain (CARDIA)    Difficulty of Paying Living Expenses: Not hard at all  Food Insecurity: Food Insecurity Present (06/23/2022)   Hunger Vital Sign     Worried About Running Out of Food in the Last Year: Sometimes true    Ran Out of Food in the Last Year: Never true  Transportation Needs: No Transportation Needs (06/23/2022)   PRAPARE - Administrator, Civil Service (Medical): No    Lack of Transportation (Non-Medical): No  Physical Activity: Inactive (06/23/2022)   Exercise Vital Sign    Days of Exercise per Week: 0 days    Minutes of Exercise per Session: 0 min  Stress: No Stress Concern Present (06/23/2022)   [employer] of Occupational Health - Occupational Stress Questionnaire    Feeling of Stress : Only a little  Social Connections: Moderately Isolated (06/23/2022)   Social Connection and Isolation Panel [NHANES]    Frequency of Communication with Friends and Family: Three  times a week    Frequency of Social Gatherings with Friends and Family: Twice a week    Attends Religious Services: More than 4 times per year    Active Member of [employer] or Organizations: No    Attends Engineer, structural: Never    Marital Status: Divorced    Tobacco Counseling Counseling given: Yes   Clinical Intake:  Pre-visit preparation completed: Yes  Pain : No/denies pain     BMI - recorded: 17.36 Nutritional Status: BMI <19  Underweight Nutritional Risks: None Diabetes: No  How often do you need to have someone help you when you read instructions, pamphlets, or other written materials from your doctor or pharmacy?: 1 - Never  Interpreter Needed?: No  Information entered by ::  Nylee Barbuto, CMA   Activities of Daily Living    06/23/2022   11:08 AM 06/20/2022    5:41 PM  In your present state of health, do you have any difficulty performing the following activities:  Hearing? 0 0  Vision? 0 0  Difficulty concentrating or making decisions? 0 0  Walking or climbing stairs? 1 1  Comment chronic respiratory disease   Dressing or bathing? 0 0  Doing errands, shopping? 0 0  Preparing Food and eating ? N N  Using  the Toilet? N N  In the past six months, have you accidently leaked urine? Y Y  Do you have problems with loss of bowel control? Y Y  Managing your Medications? N N  Managing your Finances? N N  Housekeeping or managing your Housekeeping? N N    Patient Care Team: Billie Lade, MD as PCP - General (Internal Medicine) Therese Sarah, RN as Oncology Nurse Navigator (Oncology) Doreatha Massed, MD as Medical Oncologist (Oncology) Nyoka Cowden, MD as Consulting Physician (Pulmonary Disease)  Indicate any recent Medical Services you may have received from other than [facility] providers in the past year (date may be approximate).     Assessment:   This is a routine wellness examination for Mahiya.  Hearing/Vision screen Hearing Screening - Comments:: Patient denies any hearing difficulties.   Vision Screening - Comments:: Wears rx glasses - up to date with routine eye exams with  [facility]   Dietary issues and exercise activities discussed:     Goals Addressed             This Visit's Progress    Patient Stated       Patients goal is to eat healthier this year       Depression Screen    06/23/2022   11:03 AM 05/12/2022    1:56 PM 04/29/2022   11:22 AM 08/12/2020    2:40 PM  PHQ 2/9 Scores  PHQ - 2 Score 0 0 0 0  PHQ- 9 Score  0      Fall Risk    06/23/2022   11:07 AM 06/20/2022    5:41 PM 06/18/2022    4:28 PM 05/12/2022    1:56 PM  Fall Risk   Falls in the past year? 1 1 1 1   Number falls in past yr: 1 1 1 1   Injury with Fall? 1 0 0 1  Risk for fall due to : History of fall(s);Impaired balance/gait;Orthopedic patient   History of fall(s);Impaired balance/gait;Impaired mobility  Follow up Education provided;Falls prevention discussed   Falls evaluation completed    MEDICARE RISK AT HOME:   TIMED UP AND GO:  Was the test performed?  No    Cognitive Function:        06/23/2022   11:09 AM  6CIT Screen  What Year? 0 points  What  month? 0 points  What time? 0 points  Count back from 20 0 points  Months in reverse 0 points  Repeat phrase 0 points  Total Score 0 points    Immunizations Immunization History  Administered Date(s) Administered   Influenza Split 10/06/2010, 10/05/2012   Influenza Whole 10/06/2011, 11/04/2020   Influenza,inj,Quad PF,6+ Mos 10/06/2015   Moderna Sars-Covid-2 Vaccination 05/04/2019, 06/01/2019   Pneumococcal Polysaccharide-23 06/09/2012    TDAP status: Due, Education has been provided regarding the importance of this vaccine. Advised may receive this vaccine at local pharmacy or Health Dept. Aware to provide a copy of the vaccination record if obtained from local pharmacy or Health Dept. Verbalized acceptance and understanding.  Flu Vaccine status: Up to date  Pneumococcal vaccine status: Not age appropriate for this patient  Covid-19 vaccine status: Information provided on how to obtain vaccines.   Qualifies for Shingles Vaccine? Yes   Zostavax completed No   Shingrix Completed?: No.    Education has been provided regarding the importance of this vaccine. Patient has been advised to call insurance company to determine out of pocket expense if they have not yet received this vaccine. Advised may also receive vaccine at local pharmacy or Health Dept. Verbalized acceptance and understanding.  Screening Tests Health Maintenance  Topic Date Due   DTaP/Tdap/Td (1 - Tdap) Never done   Zoster Vaccines- Shingrix (1 of 2) Never done   COVID-19 Vaccine (3 - Moderna risk series) 06/29/2019   Colonoscopy  09/10/2022   INFLUENZA VACCINE  08/06/2022   Medicare Annual Wellness (AWV)  06/23/2023   MAMMOGRAM  06/07/2024   PAP SMEAR-Modifier  07/30/2024   Hepatitis C Screening  Completed   HIV Screening  Completed   HPV VACCINES  Aged Out    Health Maintenance  Health Maintenance Due  Topic Date Due   DTaP/Tdap/Td (1 - Tdap) Never done   Zoster Vaccines- Shingrix (1 of 2) Never done    COVID-19 Vaccine (3 - Moderna risk series) 06/29/2019   Colonoscopy  09/10/2022    Colorectal cancer screening: Type of screening: Colonoscopy. Completed 09/09/2012. Repeat every 10 years  Mammogram status: Completed 06/08/2022. Repeat every year  Bone Density status:  Not age appropriate for this patient  Lung Cancer Screening: (Low Dose CT Chest recommended if Age 75-80 years, 20 pack-year currently smoking OR have quit w/in 15years.) does not qualify.   Additional Screening:  Hepatitis C Screening: does qualify; Completed 03/25/2022  Vision Screening: Recommended annual ophthalmology exams for early detection of glaucoma and other disorders of the eye. Is the patient up to date with their annual eye exam?  Yes  Who is the provider or what is the name of the office in which the patient attends annual eye exams? [facility] If pt is not established with a provider, would they like to be referred to a provider to establish care? No .   Dental Screening: Recommended annual dental exams for proper oral hygiene  Diabetic Foot Exam: N/A  Community Resource Referral / Chronic Care Management: CRR required this visit?  No   CCM required this visit?  No     Plan:     I have personally reviewed and noted the following in the patient's chart:   Medical and social history Use of alcohol, tobacco or illicit drugs  Current medications and supplements including opioid prescriptions. Patient is not currently taking opioid prescriptions. Functional ability and status Nutritional status Physical activity Advanced directives List of other physicians Hospitalizations, surgeries, and ER visits in previous 12 months Vitals Screenings to include cognitive, depression, and falls Referrals and appointments  In addition, I have reviewed and discussed with patient certain preventive protocols, quality metrics, and best practice recommendations. A written personalized care plan  for preventive services as well as general preventive health recommendations were provided to patient.     Jordan Hawks Yoltzin Ransom, CMA   06/23/2022   After Visit Summary: (MyChart) Due to this being a telephonic visit, the after visit summary with patients personalized plan was offered to patient via MyChart   Nurse Notes: Patient will be due for colonoscopy in September. She is already established with GI

## 2022-06-23 NOTE — Patient Instructions (Signed)
Donna Watkins , Thank you for taking time to come for your Medicare Wellness Visit. I appreciate your ongoing commitment to your health goals. Please review the following plan we discussed and let me know if I can assist you in the future.   These are the goals we discussed:  Goals      Patient Stated     Patients goal is to eat healthier this year        This is a list of the screening recommended for you and due dates:  Health Maintenance  Topic Date Due   DTaP/Tdap/Td vaccine (1 - Tdap) Never done   Zoster (Shingles) Vaccine (1 of 2) Never done   COVID-19 Vaccine (3 - Moderna risk series) 06/29/2019   Colon Cancer Screening  09/10/2022   Flu Shot  08/06/2022   Medicare Annual Wellness Visit  06/23/2023   Mammogram  06/07/2024   Pap Smear  07/30/2024   Hepatitis C Screening  Completed   HIV Screening  Completed   HPV Vaccine  Aged Out    Advanced directives: Advance directive discussed with you today. Even though you declined this today, please call our office should you change your mind, and we can give you the proper paperwork for you to fill out. Advance care planning is a way to make decisions about medical care that fits your values in case you are ever unable to make these decisions for yourself.  Information on Advanced Care Planning can be found at [facility] Advance Health Care Directives Advance Health Care Directives ([email or web address]    Conditions/risks identified: You are due for the following vaccines:Tetanus, Shingles series, and Covid Booster You can have these done at your preferred pharmacy. Please have them send [place] documentation of the vaccines given so that we can update your chart.    Next appointment:  Follow up in one year for your annual wellness visit. July 14, 2023 at 3pm telephone visit.   Preventive Care 40-64 Years, Female Preventive care refers to lifestyle choices and visits with your health care provider that can promote health  and wellness. What does preventive care include? A yearly physical exam. This is also called an annual well check. Dental exams once or twice a year. Routine eye exams. Ask your health care provider how often you should have your eyes checked. Personal lifestyle choices, including: Daily care of your teeth and gums. Regular physical activity. Eating a healthy diet. Avoiding tobacco and drug use. Limiting alcohol use. Practicing safe sex. Taking low-dose aspirin daily starting at age 95. Taking vitamin and mineral supplements as recommended by your health care provider. What happens during an annual well check? The services and screenings done by your health care provider during your annual well check will depend on your age, overall health, lifestyle risk factors, and family history of disease. Counseling  Your health care provider may ask you questions about your: Alcohol use. Tobacco use. Drug use. Emotional well-being. Home and relationship well-being. Sexual activity. Eating habits. Work and work Astronomer. Method of birth control. Menstrual cycle. Pregnancy history. Screening  You may have the following tests or measurements: Height, weight, and BMI. Blood pressure. Lipid and cholesterol levels. These may be checked every 5 years, or more frequently if you are over 63 years old. Skin check. Lung cancer screening. You may have this screening every year starting at age 7 if you have a 30-pack-year history of smoking and currently smoke or have quit within the past  15 years. Fecal occult blood test (FOBT) of the stool. You may have this test every year starting at age 72. Flexible sigmoidoscopy or colonoscopy. You may have a sigmoidoscopy every 5 years or a colonoscopy every 10 years starting at age 101. Hepatitis C blood test. Hepatitis B blood test. Sexually transmitted disease (STD) testing. Diabetes screening. This is done by checking your blood sugar (glucose) after  you have not eaten for a while (fasting). You may have this done every 1-3 years. Mammogram. This may be done every 1-2 years. Talk to your health care provider about when you should start having regular mammograms. This may depend on whether you have a family history of breast cancer. BRCA-related cancer screening. This may be done if you have a family history of breast, ovarian, tubal, or peritoneal cancers. Pelvic exam and Pap test. This may be done every 3 years starting at age 42. Starting at age 2, this may be done every 5 years if you have a Pap test in combination with an HPV test. Bone density scan. This is done to screen for osteoporosis. You may have this scan if you are at high risk for osteoporosis. Discuss your test results, treatment options, and if necessary, the need for more tests with your health care provider. Vaccines  Your health care provider may recommend certain vaccines, such as: Influenza vaccine. This is recommended every year. Tetanus, diphtheria, and acellular pertussis (Tdap, Td) vaccine. You may need a Td booster every 10 years. Zoster vaccine. You may need this after age 40. Pneumococcal 13-valent conjugate (PCV13) vaccine. You may need this if you have certain conditions and were not previously vaccinated. Pneumococcal polysaccharide (PPSV23) vaccine. You may need one or two doses if you smoke cigarettes or if you have certain conditions. Talk to your health care provider about which screenings and vaccines you need and how often you need them. This information is not intended to replace advice given to you by your health care provider. Make sure you discuss any questions you have with your health care provider. Document Released: 01/18/2015 Document Revised: 09/11/2015 Document Reviewed: 10/23/2014 Elsevier Interactive Patient Education  2017 [employer].    Fall Prevention in the Home Falls can cause injuries. They can happen to people of all ages. There  are many things you can do to make your home safe and to help prevent falls. What can I do on the outside of my home? Regularly fix the edges of walkways and driveways and fix any cracks. Remove anything that might make you trip as you walk through a door, such as a raised step or threshold. Trim any bushes or trees on the path to your home. Use bright outdoor lighting. Clear any walking paths of anything that might make someone trip, such as rocks or tools. Regularly check to see if handrails are loose or broken. Make sure that both sides of any steps have handrails. Any raised decks and porches should have guardrails on the edges. Have any leaves, snow, or ice cleared regularly. Use sand or salt on walking paths during winter. Clean up any spills in your garage right away. This includes oil or grease spills. What can I do in the bathroom? Use night lights. Install grab bars by the toilet and in the tub and shower. Do not use towel bars as grab bars. Use non-skid mats or decals in the tub or shower. If you need to sit down in the shower, use a plastic, non-slip stool. Keep  the floor dry. Clean up any water that spills on the floor as soon as it happens. Remove soap buildup in the tub or shower regularly. Attach bath mats securely with double-sided non-slip rug tape. Do not have throw rugs and other things on the floor that can make you trip. What can I do in the bedroom? Use night lights. Make sure that you have a light by your bed that is easy to reach. Do not use any sheets or blankets that are too big for your bed. They should not hang down onto the floor. Have a firm chair that has side arms. You can use this for support while you get dressed. Do not have throw rugs and other things on the floor that can make you trip. What can I do in the kitchen? Clean up any spills right away. Avoid walking on wet floors. Keep items that you use a lot in easy-to-reach places. If you need to  reach something above you, use a strong step stool that has a grab bar. Keep electrical cords out of the way. Do not use floor polish or wax that makes floors slippery. If you must use wax, use non-skid floor wax. Do not have throw rugs and other things on the floor that can make you trip. What can I do with my stairs? Do not leave any items on the stairs. Make sure that there are handrails on both sides of the stairs and use them. Fix handrails that are broken or loose. Make sure that handrails are as long as the stairways. Check any carpeting to make sure that it is firmly attached to the stairs. Fix any carpet that is loose or worn. Avoid having throw rugs at the top or bottom of the stairs. If you do have throw rugs, attach them to the floor with carpet tape. Make sure that you have a light switch at the top of the stairs and the bottom of the stairs. If you do not have them, ask someone to add them for you. What else can I do to help prevent falls? Wear shoes that: Do not have high heels. Have rubber bottoms. Are comfortable and fit you well. Are closed at the toe. Do not wear sandals. If you use a stepladder: Make sure that it is fully opened. Do not climb a closed stepladder. Make sure that both sides of the stepladder are locked into place. Ask someone to hold it for you, if possible. Clearly mark and make sure that you can see: Any grab bars or handrails. First and last steps. Where the edge of each step is. Use tools that help you move around (mobility aids) if they are needed. These include: Canes. Walkers. Scooters. Crutches. Turn on the lights when you go into a dark area. Replace any light bulbs as soon as they burn out. Set up your furniture so you have a clear path. Avoid moving your furniture around. If any of your floors are uneven, fix them. If there are any pets around you, be aware of where they are. Review your medicines with your doctor. Some medicines can make  you feel dizzy. This can increase your chance of falling. Ask your doctor what other things that you can do to help prevent falls. This information is not intended to replace advice given to you by your health care provider. Make sure you discuss any questions you have with your health care provider. Document Released: 10/18/2008 Document Revised: 05/30/2015 Document Reviewed: 01/26/2014 Elsevier  Interactive Patient Education  2017 [employer].

## 2022-06-25 ENCOUNTER — Ambulatory Visit
Admission: RE | Admit: 2022-06-25 | Discharge: 2022-06-25 | Disposition: A | Discharge status: Home / Self Care | Payer: Medicare HMO | Source: Ambulatory Visit | Attending: Family Medicine | Admitting: Family Medicine

## 2022-06-25 DIAGNOSIS — R928 Other abnormal and inconclusive findings on diagnostic imaging of breast: Secondary | ICD-10-CM

## 2022-06-25 HISTORY — PX: BREAST BIOPSY: SHX20

## 2022-06-29 ENCOUNTER — Other Ambulatory Visit (HOSPITAL_COMMUNITY): Payer: Self-pay | Admitting: Internal Medicine

## 2022-06-29 ENCOUNTER — Other Ambulatory Visit: Payer: Self-pay

## 2022-06-29 DIAGNOSIS — R928 Other abnormal and inconclusive findings on diagnostic imaging of breast: Secondary | ICD-10-CM

## 2022-07-01 ENCOUNTER — Encounter: Payer: Self-pay | Admitting: Radiation Oncology

## 2022-07-01 NOTE — Progress Notes (Signed)
  Radiation Oncology         [phone]) [phone] ________________________________  Name: EMMA BIRCHLER MRN: [id number]  Date: 07/02/2022  DOB: [birth year]  End of Treatment Note  Diagnosis: The primary encounter diagnosis was Malignant neoplasm of lower lobe of right lung. A diagnosis of Nodule of lower lobe of right lung was also pertinent to this visit.   Enlarging lesion of the right lower lobe    Squamous cell carcinoma of the LUL, Clinical Stage IA-3, (T1c, N0, M0)     Indication for treatment: Curative        Radiation treatment dates: 05/19/22 through 05/26/22   Site/dose: Right lung - 54 Gy delivered in 3 Fx at 18 Gy/Fx  Beams/energy:  6X-FFF  Technique/Mode: SBRT/SRT-IMRT / Photon  Narrative: The patient tolerated radiation treatment relatively well. On the date of her final treatment, she endorsed some mild fatigue a mild cough. She denied any other side effects.   Plan: The patient has completed radiation treatment. The patient will return to radiation oncology clinic for routine followup in one month. I advised them to call or return sooner if they have any questions or concerns related to their recovery or treatment.  -----------------------------------  Billie Lade, PhD, MD  This document serves as a record of services personally performed by Antony Blackbird, MD. It was created on his behalf by Neena Rhymes, a trained medical scribe. The creation of this record is based on the scribe's personal observations and the provider's statements to them. This document has been checked and approved by the attending provider.

## 2022-07-02 ENCOUNTER — Encounter: Payer: Self-pay | Admitting: Radiation Oncology

## 2022-07-02 ENCOUNTER — Ambulatory Visit
Admission: RE | Admit: 2022-07-02 | Discharge: 2022-07-02 | Disposition: A | Discharge status: Home / Self Care | Payer: Medicare HMO | Source: Ambulatory Visit | Attending: Radiation Oncology | Admitting: Radiation Oncology

## 2022-07-02 VITALS — BP 110/64 | HR 97 | Temp 98.0°F | Resp 20 | Ht 63.0 in | Wt 96.5 lb

## 2022-07-02 DIAGNOSIS — R911 Solitary pulmonary nodule: Secondary | ICD-10-CM

## 2022-07-02 DIAGNOSIS — C3412 Malignant neoplasm of upper lobe, left bronchus or lung: Secondary | ICD-10-CM | POA: Diagnosis not present

## 2022-07-02 DIAGNOSIS — Z923 Personal history of irradiation: Secondary | ICD-10-CM | POA: Insufficient documentation

## 2022-07-02 DIAGNOSIS — C3492 Malignant neoplasm of unspecified part of left bronchus or lung: Secondary | ICD-10-CM

## 2022-07-02 NOTE — Progress Notes (Signed)
Donna Watkins is here today for follow up post radiation to the lung.  Lung Side: Left,patient completed treatment on 05/26/22.  Does the patient complain of any of the following: Pain:No Shortness of breath w/wo exertion: Yes, Shortness of breath, continues on oxygen 3 L via nasal canula.  Cough: No Hemoptysis: No Pain with swallowing: No Swallowing/choking concerns: No Appetite: Good Weight:  Wt Readings from Last 3 Encounters:  07/02/22 96 lb 8 oz (43.8 kg)  06/23/22 98 lb (44.5 kg)  05/12/22 89 lb (40.4 kg)   Energy Level: Good  Post radiation skin Changes: No     Additional comments if applicable:  Patient had biopsy of left breast due to abnormal mammogram.    Patient reports hair loss.  BP 110/64 (BP Location: Left Arm, Patient Position: Sitting)   Pulse 97   Temp 98 F (36.7 C) (Temporal)   Resp 20   Ht 5\' 3"  (1.6 m)   Wt 96 lb 8 oz (43.8 kg)   LMP 05/06/2010   SpO2 95%   BMI 17.09 kg/m

## 2022-07-02 NOTE — Progress Notes (Signed)
Radiation Oncology         [phone] ________________________________  Name: NIVEDITA MIRABELLA MRN: [id number]  Date: 07/02/2022  DOB: 08/24/1960  Follow-Up Visit Note  CC: Billie Lade, MD  Towana Badger, MD    ICD-10-CM   1. Nodule of upper lobe of left lung [R91.1]  R91.1 CT Chest Wo Contrast    2. Squamous carcinoma of lung, left (HCC)  C34.92       Diagnosis: The primary encounter diagnosis was Malignant neoplasm of lower lobe of right lung. A diagnosis of Nodule of lower lobe of right lung was also pertinent to this visit.   Enlarging lesion of the right lower lobe    Squamous cell carcinoma of the LUL, Clinical Stage IA-3, (T1c, N0, M0)    Interval Since Last Radiation: 1 month and 6 days   Indication for treatment: Curative       Radiation treatment dates: 05/19/22 through 05/26/22  Site/dose: Right lung - 54 Gy delivered in 3 Fx at 18 Gy/Fx Beams/energy:  6X-FFF Technique/Mode: SBRT/SRT-IMRT / Photon  Narrative:  The patient returns today for routine follow-up. The patient tolerated radiation treatment relatively well. On the date of her final treatment, she endorsed some mild fatigue a mild cough. She denied any other side effects.       Prior to initiating radiation therapy, the patient followed up with Dr. Sherene Sires on 05/12/22. During which time, she reported a return in baseline in her dyspnea and no change in her respiratory symptoms overall. She continues to use 3-3.5 L at baseline.   She has also been meeting with wound care for a sacral pressure injury. She has been given hydrafera blue dressings and performs dressing changes MWF.     Of note: Since her consultation date, the patient presented for a routine screening mammogram on 06/08/22 which showed a possible abnormality in the left breast. Left breast diagnostic mammogram and left breast ultrasound on 06/16/22 showed a distortion in the upper outer left breast mammographically without sonographic  correlate. A small benign cyst was also seen in the 2 o'clock left breast measuring 6 mm. No evidence of left axillary lymphadenopathy was appreciated. Biopsy of the left breast distortion on 06/25/22 showed benign breast tissue with fibrocystic changes, mild stromal fibrosis, and PASH type changes.   Patient reports to be doing well overall today. She noticed no changes during or since her radiation treatment. She continues to be on 2L of oxygen at home and 2-2.5 when she is out. She denies any cough, hemoptysis, odynophagia, or swallowing concerns. Her weight is increasing.   Wt Readings from Last 3 Encounters:  07/02/22 96 lb 8 oz (43.8 kg)  06/23/22 98 lb (44.5 kg)  05/12/22 89 lb (40.4 kg)    Allergies:  has No Known Allergies.  Meds: Current Outpatient Medications  Medication Sig Dispense Refill   albuterol (PROVENTIL) (2.5 MG/3ML) 0.083% nebulizer solution Take 2.5 mg by nebulization See admin instructions. 2.5 mg by nebulization five times daily     albuterol (VENTOLIN HFA) 108 (90 Base) MCG/ACT inhaler Inhale 2 puffs into the lungs every 4 (four) hours as needed for wheezing or shortness of breath. 1 each 3   ALPRAZolam (XANAX) 1 MG tablet Take 1 tablet (1 mg total) by mouth 3 (three) times daily as needed for anxiety. 30 tablet 0   atorvastatin (LIPITOR) 20 MG tablet Take 1 tablet (20 mg total) by mouth daily. Restart on 04/03/22     budesonide (PULMICORT)  0.5 MG/2ML nebulizer solution Take 2 mLs (0.5 mg total) by nebulization 2 (two) times daily. 120 mL 1   collagenase (SANTYL) 250 UNIT/GM ointment Apply 1 Application topically daily. 15 g 11   feeding supplement (ENSURE ENLIVE / ENSURE PLUS) LIQD Take 237 mLs by mouth 2 (two) times daily between meals. 237 mL 12   ipratropium-albuterol (DUONEB) 0.5-2.5 (3) MG/3ML SOLN Take 3 mLs by nebulization 3 (three) times daily. And as needed for SOB 360 mL 3   Multiple Vitamins-Minerals (CENTRUM SILVER PO) Take 1 tablet by mouth daily.      nutrition supplement, JUVEN, (JUVEN) PACK Take 1 packet by mouth 2 (two) times daily between meals.  0   omeprazole (PRILOSEC) 20 MG capsule Take 20 mg by mouth daily.     OXYGEN Place 3 L into the nose continuous.     No current facility-administered medications for this encounter.    Physical Findings: The patient is in no acute distress. Patient is alert and oriented.  height is 5\' 3"  (1.6 m) and weight is 96 lb 8 oz (43.8 kg). Her temporal temperature is 98 F (36.7 C). Her blood pressure is 110/64 and her pulse is 97. Her respiration is 20 and oxygen saturation is 95%. .   Lungs are clear to auscultation bilaterally. Heart has regular rate and rhythm. No palpable cervical, supraclavicular, or axillary adenopathy. Abdomen soft, non-tender, normal bowel sounds.   Lab Findings: Lab Results  Component Value Date   WBC 5.9 05/12/2022   HGB 11.4 05/12/2022   HCT 36.3 05/12/2022   MCV 96 05/12/2022   PLT 268 05/12/2022    Radiographic Findings: MM CLIP PLACEMENT LEFT  Result Date: 06/25/2022 CLINICAL DATA:  Status post stereo biopsy left breast asymmetry EXAM: 3D DIAGNOSTIC LEFT MAMMOGRAM POST STEREOTACTIC BIOPSY COMPARISON:  Previous exam(s). FINDINGS: 3D Mammographic images were obtained following stereotactic guided biopsy of left breast asymmetry. The biopsy marking clip is in expected position at the site of biopsy. IMPRESSION: Appropriate positioning of the coil shaped biopsy marking clip at the site of biopsy in the upper-outer left breast. Final Assessment: Post Procedure Mammograms for Marker Placement Electronically Signed   By: Annia Belt M.D.   On: 06/25/2022 13:14  MM LT BREAST BX W LOC DEV 1ST LESION IMAGE BX SPEC STEREO GUIDE  Result Date: 06/25/2022 CLINICAL DATA:  Patient with left breast asymmetry/distortion. EXAM: LEFT BREAST STEREOTACTIC CORE NEEDLE BIOPSY COMPARISON:  Previous exam(s). FINDINGS: The patient and I discussed the procedure of stereotactic-guided biopsy  including benefits and alternatives. We discussed the high likelihood of a successful procedure. We discussed the risks of the procedure including infection, bleeding, tissue injury, clip migration, and inadequate sampling. Informed written consent was given. The usual time out protocol was performed immediately prior to the procedure. Using sterile technique and 1% Lidocaine as local anesthetic, under stereotactic guidance, a 9 gauge vacuum assisted device was used to perform core needle biopsy of the asymmetry/distortion outer left breast using a cranial approach. Lesion quadrant: Upper outer quadrant At the conclusion of the procedure, coil shaped tissue marker clip was deployed into the biopsy cavity. Follow-up 2-view mammogram was performed and dictated separately. IMPRESSION: Stereotactic-guided biopsy of left breast asymmetry/distortion. No apparent complications. Electronically Signed   By: Annia Belt M.D.   On: 06/25/2022 12:19  [place] LIMITED ULTRASOUND INCLUDING AXILLA LEFT BREAST   Result Date: 06/16/2022 CLINICAL DATA:  Screening recall for a left breast asymmetry. EXAM: DIGITAL DIAGNOSTIC UNILATERAL LEFT MAMMOGRAM WITH TOMOSYNTHESIS; ULTRASOUND  LEFT BREAST LIMITED TECHNIQUE: Left digital diagnostic mammography and breast tomosynthesis was performed.; Targeted ultrasound examination of the left breast was performed. COMPARISON:  Previous exam(s). ACR Breast Density Category b: There are scattered areas of fibroglandular density. FINDINGS: Spot compression tomosynthesis images through the upper outer quadrant of the left breast demonstrates an area of distortion in the mid to posterior depth. The distortion is most apparent on the craniocaudal view, but subtly seen in the superior mid to posterior left breast on the full paddle true lateral image. Physical exam of the upper-outer quadrant of the left breast demonstrates no suspicious palpable masses. Ultrasound of the upper-outer left breast  demonstrates normal fibroglandular tissue. A small benign-appearing cyst is seen at 2 o'clock, 5 cm from the nipple measuring 6 mm. No abnormal lymph nodes are found in the left axilla. IMPRESSION: 1. There is a mammographic distortion in the upper-outer left breast without a sonographic correlate. 2.  No evidence of left axillary lymphadenopathy. RECOMMENDATION: Stereotactic biopsy is recommended for the left breast distortion. We will assist the patient in scheduling the procedure at [facility]. I have discussed the findings and recommendations with the patient. If applicable, a reminder letter will be sent to the patient regarding the next appointment. BI-RADS CATEGORY  4: Suspicious. Electronically Signed   By: Frederico Hamman M.D.   On: 06/16/2022 16:05  MM 3D DIAGNOSTIC MAMMOGRAM UNILATERAL LEFT BREAST  Result Date: 06/16/2022 CLINICAL DATA:  Screening recall for a left breast asymmetry. EXAM: DIGITAL DIAGNOSTIC UNILATERAL LEFT MAMMOGRAM WITH TOMOSYNTHESIS; ULTRASOUND LEFT BREAST LIMITED TECHNIQUE: Left digital diagnostic mammography and breast tomosynthesis was performed.; Targeted ultrasound examination of the left breast was performed. COMPARISON:  Previous exam(s). ACR Breast Density Category b: There are scattered areas of fibroglandular density. FINDINGS: Spot compression tomosynthesis images through the upper outer quadrant of the left breast demonstrates an area of distortion in the mid to posterior depth. The distortion is most apparent on the craniocaudal view, but subtly seen in the superior mid to posterior left breast on the full paddle true lateral image. Physical exam of the upper-outer quadrant of the left breast demonstrates no suspicious palpable masses. Ultrasound of the upper-outer left breast demonstrates normal fibroglandular tissue. A small benign-appearing cyst is seen at 2 o'clock, 5 cm from the nipple measuring 6 mm. No abnormal lymph nodes are found  in the left axilla. IMPRESSION: 1. There is a mammographic distortion in the upper-outer left breast without a sonographic correlate. 2.  No evidence of left axillary lymphadenopathy. RECOMMENDATION: Stereotactic biopsy is recommended for the left breast distortion. We will assist the patient in scheduling the procedure at [facility]. I have discussed the findings and recommendations with the patient. If applicable, a reminder letter will be sent to the patient regarding the next appointment. BI-RADS CATEGORY  4: Suspicious. Electronically Signed   By: Frederico Hamman M.D.   On: 06/16/2022 16:05  MM 3D SCREENING MAMMOGRAM BILATERAL BREAST  Result Date: 06/09/2022 CLINICAL DATA:  Screening. EXAM: DIGITAL SCREENING BILATERAL MAMMOGRAM WITH TOMOSYNTHESIS AND CAD TECHNIQUE: Bilateral screening digital craniocaudal and mediolateral oblique mammograms were obtained. Bilateral screening digital breast tomosynthesis was performed. The images were evaluated with computer-aided detection. COMPARISON:  Previous exam(s). ACR Breast Density Category b: There are scattered areas of fibroglandular density. FINDINGS: In the left breast, a possible asymmetry warrants further evaluation. In the right breast, no findings suspicious for malignancy. IMPRESSION: Further evaluation is suggested for possible asymmetry in the  left breast. RECOMMENDATION: Diagnostic mammogram and possibly ultrasound of the left breast. (Code:[id number]) The patient will be contacted regarding the findings, and additional imaging will be scheduled. BI-RADS CATEGORY  0: Incomplete: Need additional imaging evaluation. Electronically Signed   By: Frederico Hamman M.D.   On: 06/09/2022 14:35    Impression: The primary encounter diagnosis was Malignant neoplasm of lower lobe of right lung. A diagnosis of Nodule of lower lobe of right lung was also pertinent to this visit.   Enlarging lesion of the right lower lobe, s/p SBRT    Squamous cell carcinoma of the LUL, Clinical Stage IA-3, (T1c, N0, M0) , s/p SBRT  She tolerated her SBRT quite well without any lasting side effects.  Plan:  Follow up chest CT in 3 months. Routine office visit 3 days after scan to review results. Patient knows to call with any questions or concerns in the meantime.     ____________________________________  Joyice Faster, PA-C   Billie Lade, PhD, MD  This document serves as a record of services personally performed by Antony Blackbird, MD. It was created on his behalf by Neena Rhymes, a trained medical scribe. The creation of this record is based on the scribe's personal observations and the provider's statements to them. This document has been checked and approved by the attending provider.

## 2022-07-13 ENCOUNTER — Telehealth: Payer: Self-pay | Admitting: Internal Medicine

## 2022-07-13 NOTE — Telephone Encounter (Signed)
Gave verbal orders to Sarah

## 2022-07-13 NOTE — Telephone Encounter (Signed)
Norton Pastel, w [facility] called in on patient behalf  Verbal orders, nursing / wound care   1 x week for 9 weeks  Stage 3 pressure ulcer   Call back # [phone]

## 2022-07-20 DIAGNOSIS — J9612 Chronic respiratory failure with hypercapnia: Secondary | ICD-10-CM | POA: Diagnosis not present

## 2022-07-20 DIAGNOSIS — J9611 Chronic respiratory failure with hypoxia: Secondary | ICD-10-CM | POA: Diagnosis not present

## 2022-07-20 DIAGNOSIS — E43 Unspecified severe protein-calorie malnutrition: Secondary | ICD-10-CM

## 2022-07-20 DIAGNOSIS — K219 Gastro-esophageal reflux disease without esophagitis: Secondary | ICD-10-CM

## 2022-07-20 DIAGNOSIS — F410 Panic disorder [episodic paroxysmal anxiety] without agoraphobia: Secondary | ICD-10-CM

## 2022-07-20 DIAGNOSIS — F32A Depression, unspecified: Secondary | ICD-10-CM

## 2022-07-20 DIAGNOSIS — C3490 Malignant neoplasm of unspecified part of unspecified bronchus or lung: Secondary | ICD-10-CM

## 2022-07-20 DIAGNOSIS — J439 Emphysema, unspecified: Secondary | ICD-10-CM | POA: Diagnosis not present

## 2022-07-20 DIAGNOSIS — M419 Scoliosis, unspecified: Secondary | ICD-10-CM

## 2022-07-20 DIAGNOSIS — L89154 Pressure ulcer of sacral region, stage 4: Secondary | ICD-10-CM | POA: Diagnosis not present

## 2022-07-20 DIAGNOSIS — E782 Mixed hyperlipidemia: Secondary | ICD-10-CM

## 2022-07-20 DIAGNOSIS — I700 Atherosclerosis of aorta: Secondary | ICD-10-CM

## 2022-08-06 NOTE — Progress Notes (Signed)
Subjective:   Patient ID: Donna Watkins, female    DOB: [birth year]     MRN: [id number]    Brief patient profile:  61 yowf  MM/quit smoking in 2013 with severe copd at baseline last seen in pulmonary clinic  by [facility] 11/2013  GOLD IV criteria with PFTs 05/2012 ith fev1 only 26%, diffusion at 52%    History of Present Illness  07/31/2016 1st office visit/    GOLD IV copd/ 02 2lpm 24/7  Chief Complaint  Patient presents with   Pulmonary Consult    Pt is self referred for COPD. Pt c/o DOE, throat clearing. Pt denies CP/tightness anf recentl f/c/s. Pt states she rarely uses albuterol hfa.   doe -  turns up 3lpm walking but still doe x 50 ft Use scooter at [employer] x 5 years  Sleeps on 4lpm / no am ha / cough or congestion  Uses neb avg bid, very poor  insight into meds/02  Some better breathing p neb saba rec  Call us if you want to be referred to [facility] for pulmonary rehab - in meantime continue 2lpm at rest and 3lpm with activity    Please schedule a follow up visit in 3 months but call sooner if needed with full pfts on return      12/31/2016  f/u ov/ re:  2lpm 24/7  Chief Complaint  Patient presents with   Follow-up    review PFT results. States breathing has been ok since last visit.    breathing ok at rest but can't walk 300 ft even on up to 3lpm s sob on flat surface slow pace  rec Stay on alb/ipatropium up to 4 x daily    07/02/2017  f/u ov/ re: GOLD IV  / 02 24/7  Chief Complaint  Patient presents with   Follow-up    Breathing is overall doing well overall. She is using her albuterol inhaler once per wk on average. She uses her albuterol and atrovent 2 x daily on average. She states she has been losing wt unintentionally.    Dyspnea:  Not usually walking more than a few hundred feet on 3lpm / still using scooter at [employer] Cough: not now Sleeping: insomnia no resp complaints  SABA use: about twice dialy duoneb  02: 2lpm x 3lpm  walking   rec Plan A = Automatic = Bevespi Take 2 puffs first thing in am and then another 2 puffs about 12 hours later.  Plan B = Backup Only use your albuterol as a rescue medication Plan C = Crisis - only use your albuterol (no ipatropium)  nebulizer if you first try Plan B and it fails to help > ok to use the nebulizer up to every 4 hours but if start needing it regularly call for immediate appointment  RT complete Dec 2022 tol ok dx sq cell ca LUL  02/19/2021  f/u ov/[place] office/ re: COPD GOLD 4 / 02 dep   maint on duoneb qid   Chief Complaint  Patient presents with   Follow-up    Feels like breathing is about the same since last OV.    Dyspnea:  no longer doing mb  but is shopping ok at [employer]  Cough: none  Sleeping: no resp cc  SABA use: maybe once a day  02: 3lpm hs and daytime does not titrate  Covid status: vax x 4  Dysphagia x one year preceded RT and already on  ppi s overt hb Rec Make sure you check your oxygen saturation  AT  your highest level of activity (not after you stop)   to be sure it stays over 90%   Have Dr Sudie Bailey refer you to a new GI doctor since Dr Darrick Penna has moved     08/18/2021  f/u ov/[place] office/ re: gold iv/ 02 Dep maint on duoneb bid and prn   Chief Complaint  Patient presents with   Follow-up    Heat bothers patients breathing but overall doing well.   Dyspnea:  still shopping [employer]  Cough: none  Sleeping: able to lie down flat/level and 2 pillows  SABA use: rarely needing extra unless over does it 02: 3lpm hs  and during the day  Mild dysphagia with small HH on CT chest 07/14/21  Rec No change in medications needed Please schedule a follow up visit in 6 months but call sooner if needed   Admit date:     03/25/2022  Discharge date: 03/28/22         Hospital Course: 62 year old female with a history of COPD, chronic respiratory failure on 3 L, lung cancer status post XRT, anxiety, hyperlipidemia presenting  from home secondary to hypoxia and tachycardia.  The patient states that she had been in her usual state of health until 03/25/2022 when she was working with home health physical therapy.  Apparently, home health PT noted the patient to be hypoxic with ambulation into the 85% on her usual 3 L.  The patient apparently was tachycardic into the 100s.  As result, EMS was activated.  Upon EMS arrival, she was noted to have oxygen saturation in the high 80s.  Show her oxygen was increased to 4 L. In the ED, the patient was afebrile and hemodynamically stable.  She was placed on BiPAP.  WBC 11.6, hemoglobin 12.9, platelets 141,000.  Sodium 142, potassium 3.7, bicarbonate >45.  Serum creatinine 0.44.  AST 211, ALT 670, alkaline phosphatase 97, total bilirubin 1.0.  CTA chest was negative for PE.  Showed a 1.9 x 1.4 cmright lower lobe nodule.  The patient was given DuoNebs and IV Solu-Medrol.  She was admitted for acute on chronic respiratory failure.   Assessment and Plan: Acute on chronic respiratory failure with hypoxia and hypercarbia -Secondary to COPD exacerbation -03/25/2022 VBG 7.32/>123/<31/73 -Weaned off continuous BiPAP back to baseline 3 L -she still used BiPAP at night -Pt already has NIV machine at home that she is compliant with   COPD exacerbation -COVID-19/RSV/Flu negative -continue  brovana -continued pulmicort -continued yupleri -03/25/2022 CTA chest negative for PE -IV solumedrol>>d/c home with prednisone taper   Transaminasemia -Patient is hepatitis A immune -Hepatitis B and C--neg -right upper quadrant ultrasound---Diffuse gallbladder wall calcifications and shadowing consistent with the history of porcelain gallbladder. Evaluation for stones is limited. There is gallbladder wall thickening but no adjacent fluid -trend LFTs--improving -03/25/2022 CT abdomen--porcelain gallbladder; large stool burden; soft tissue stranding around the sacrum -discussed with general surgery, Dr.  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 Danie Chandler indication presently for operative intervention   Unstageable sacral decubitus ulcer -Wound care consult -There is some mild cellulitis surrounding the wound -appreciate surgery>>debrided -received vanco during hospitalization D/c home with doxy x 4 more days   Anxiety -Continue home dose alprazolam -PDMP reviewed -Patient received Xanax 1 mg, #150 monthly, last refill 02/19/2022   Hypomagnesemia/hypophosphatemia -Repleted  Mixed hyperlipidemia -Holding statin secondary to elevated LFTs   GERD -Continue PPI   Thrombocytopenia -Appears to be chronic -monitor for signs of bleeding   Moderate malnutrition -continue supplements   05/12/2022  f/u ov/[place] office/ re: GOLD 4/ 02 dep  maint on duoneb / bud  bid  Chief Complaint  Patient presents with   Follow-up    Pt f/u states that she is doing well using O2 (3.5-3Lpm) during day and CPAP qhs. No concerns currently   Dyspnea:  back to baseline = MMRC3 = can't walk 100 yards even at a slow pace at a flat grade s stopping due to sob   Cough: none  Sleeping: doing well on "cpap" (records from [facility] requested as chart suggests bipap and NIV)  SABA use: none  02: 3lpm does not titrate  Rec No change in medications Make sure you check your oxygen saturation  AT  your highest level of activity (not after you stop)   to be sure it stays over 90   08/07/2022  f/u ov/[place] office/ re: COPD GOLD  4/ 02 dep maint on duoneb bid   Chief Complaint  Patient presents with   COPD    Gold IV   Dyspnea:  shops but uses scooter at [employer]/ pushes cart on 2.5 lpm at [employer] Cough: none  Sleeping: on NIV per [facility]  SABA use: rarely hfa  02: 3lpm at house /    No obvious day to day or daytime variability or assoc excess/ purulent sputum or mucus  plugs or hemoptysis or cp or chest tightness, subjective wheeze or overt sinus or hb symptoms.    . Also denies any obvious fluctuation of symptoms with weather or environmental changes or other aggravating or alleviating factors except as outlined above   No unusual exposure hx or h/o childhood pna/ asthma or knowledge of premature birth.  Current Allergies, Complete Past Medical History, Past Surgical History, Family History, and Social History were reviewed in [employer] record.  ROS  The following are not active complaints unless bolded Hoarseness, sore throat, dysphagia, dental problems, itching, sneezing,  nasal congestion or discharge of excess mucus or purulent secretions, ear ache,   fever, chills, sweats, unintended wt loss or wt gain, classically pleuritic or exertional cp,  orthopnea pnd or arm/hand swelling  or leg swelling, presyncope, palpitations, abdominal pain, anorexia, nausea, vomiting, diarrhea  or change in bowel habits or change in bladder habits, change in stools or change in urine, dysuria, hematuria,  rash, arthralgias, visual complaints, headache, numbness, weakness or ataxia or problems with walking or coordination,  change in mood or  memory.        Current Meds  Medication Sig   albuterol (PROVENTIL) (2.5 MG/3ML) 0.083% nebulizer solution Take 2.5 mg by nebulization See admin instructions. 2.5 mg by nebulization five times daily   albuterol (VENTOLIN HFA) 108 (90 Base) MCG/ACT inhaler Inhale 2 puffs into the lungs every 4 (four) hours as needed for wheezing or shortness of breath.   ALPRAZolam (XANAX) 1 MG tablet Take 1 tablet (1 mg total) by mouth 3 (three) times daily as needed for anxiety.   atorvastatin (LIPITOR) 20 MG tablet Take 1 tablet (20 mg total) by mouth daily. Restart on 04/03/22   feeding supplement (ENSURE ENLIVE / ENSURE PLUS) LIQD Take 237 mLs by mouth 2 (two) times daily between meals.   ipratropium-albuterol (DUONEB) 0.5-2.5  (3) MG/3ML SOLN Take 3 mLs by nebulization 3 (three) times daily. And  as needed for SOB   Multiple Vitamins-Minerals (CENTRUM SILVER PO) Take 1 tablet by mouth daily.   mupirocin ointment (BACTROBAN) 2 % Apply 1 Application topically.   nutrition supplement, JUVEN, (JUVEN) PACK Take 1 packet by mouth 2 (two) times daily between meals.   omeprazole (PRILOSEC) 20 MG capsule Take 20 mg by mouth daily.   OXYGEN Place 3 L into the nose continuous.               Objective:   Physical Exam  Wts  08/07/2022           100  05/12/2022            89  08/18/2021         112  02/19/2021         114  08/14/2020         120  02/27/2020         116 08/28/2019         107  05/26/2019       106  07/04/2018        129 01/03/2018      126  07/02/2017        128 12/31/2016      136   07/31/16 145 lb 9.6 oz (66 kg)  01/22/14 180 lb (81.6 kg)  05/23/13 180 lb 12.8 oz (82 kg)      01/03/18 126 lb (57.2 kg)  07/02/17 128 lb 9.6 oz (58.3 kg)  02/04/17 130 lb (59 kg)     Vital signs reviewed  08/07/2022  - Note at rest 02 sats  89% on 2.5 lpm cont    General appearance:    very frail amb thin wf nad   HEENT :  Oropharynx  clear      NECK :  without JVD/Nodes/TM/ nl carotid upstrokes bilaterally   LUNGS: no acc muscle use,  Mod barrel  contour chest wall with bilateral  Distant bs s audible wheeze and  without cough on insp or exp maneuvers and mod  Hyperresonant  to  percussion bilaterally     CV:  RRR  no s3 or murmur or increase in P2, and no edema   ABD:  soft and nontender with pos mid insp Hoover's  in the supine position. No bruits or organomegaly appreciated, bowel sounds nl  MS:   Ext warm without deformities or   obvious joint restrictions , calf tenderness, cyanosis or clubbing  SKIN: warm and dry without lesions    NEURO:  alert, approp, nl sensorium with  no motor or cerebellar deficits apparent.           I personally reviewed images and agree with radiology impression as follows:    Chest CTa  03/25/22  No evidence of pulmonary embolus.  1.9 x 1.4 cm bilobed right lower lobe pulmonary nodule, increasing in size since prior study. This is concerning for malignancy    Aortic Atherosclerosis (ICD10-I70.0) and Emphysema (ICD10-J43.9).           Assessment & Plan:

## 2022-08-07 ENCOUNTER — Ambulatory Visit (INDEPENDENT_AMBULATORY_CARE_PROVIDER_SITE_OTHER): Payer: Medicare HMO | Admitting: Internal Medicine

## 2022-08-07 ENCOUNTER — Encounter: Payer: Self-pay | Admitting: Internal Medicine

## 2022-08-07 ENCOUNTER — Ambulatory Visit: Payer: Medicare HMO | Admitting: Internal Medicine

## 2022-08-07 VITALS — BP 100/66 | HR 89 | Ht 63.0 in | Wt 100.0 lb

## 2022-08-07 DIAGNOSIS — J9611 Chronic respiratory failure with hypoxia: Secondary | ICD-10-CM | POA: Diagnosis not present

## 2022-08-07 DIAGNOSIS — J9612 Chronic respiratory failure with hypercapnia: Secondary | ICD-10-CM

## 2022-08-07 DIAGNOSIS — C3492 Malignant neoplasm of unspecified part of left bronchus or lung: Secondary | ICD-10-CM | POA: Diagnosis not present

## 2022-08-07 DIAGNOSIS — R911 Solitary pulmonary nodule: Secondary | ICD-10-CM | POA: Diagnosis not present

## 2022-08-07 DIAGNOSIS — J449 Chronic obstructive pulmonary disease, unspecified: Secondary | ICD-10-CM | POA: Diagnosis not present

## 2022-08-07 DIAGNOSIS — L89159 Pressure ulcer of sacral region, unspecified stage: Secondary | ICD-10-CM | POA: Diagnosis not present

## 2022-08-07 DIAGNOSIS — F411 Generalized anxiety disorder: Secondary | ICD-10-CM | POA: Diagnosis not present

## 2022-08-07 NOTE — Assessment & Plan Note (Signed)
Quit smoking 2013/MM - PFT's  04/27/12  FEV1 0.70 (26 %) ratio 35  p no % improvement from saba p ? prior to study with DLCO  53 % corrects to 69 % for alv volume with classic curvature   - Alpha one AT screening   07/31/16 >   MM - PFT's  12/31/2016  FEV1 0.74 (28 % ) ratio 38  p no % improvement from saba p saba 4h   prior to study with DLCO  37/38c % corrects to 50 % for alv volume   - 07/02/2017   try bevespi: did not use correctly  - 01/03/2018    try bevespi again > could not afford and could not tell really helped vs duoneb - 05/26/2019  After extensive coaching inhaler device,  effectiveness =    90% with smi > try stiolto> could not afford it on her plan >   - as of 08/14/2020 maint on duoneb qid    Group D (now reclassified as E) in terms of symptom/risk and laba/lama/ICS  therefore appropriate rx at this point >>>  cannot afford lama/laba or even neb ICS so rec continue duoneb up to qid and call if noted decline in activity tol

## 2022-08-07 NOTE — Assessment & Plan Note (Signed)
Quit smoking 2013 11/16/2013 CT chest >Stable 3 mm nodule in right upper lobe  - CT chest 07/31/20 new  LUL nodule abutting pleura but not involving t spine - PET 08/14/2020 >>  POS > BX Sq cell ca > RT complete 12/2020 and f/u per ONC / RT   - CTa 03/25/22 increasing nodule RLL > oncology at [facility]/ RT per [place] following   Advised pt to keep all f/u with onc/RT as planned

## 2022-08-07 NOTE — Progress Notes (Signed)
Established Patient Office Visit  Subjective   Patient ID: Donna Watkins, female    DOB: [birth year]  Age: 62 y.o. MRN: [id number]  Chief Complaint  Patient presents with   Anxiety    Follow up   Donna Watkins returns to care today for routine follow-up.  She was last evaluated by me on 5/7 as a new patient presenting to establish care.  Repeat labs were ordered at that time and she was referred to psychiatry for management of anxiety.  In the interim she has been seen by pulmonology for follow-up and has completed SBRT for treatment of an enlarging RLL nodule.  She will follow-up with oncology in September and complete repeat scans prior to her appointment.  There have otherwise been no acute interval events.  Donna Watkins reports feeling well today.  She is asymptomatic currently and has no acute concerns to discuss.  She has not establish care with psychiatry because she declined to schedule an appointment when contacted.  Past Medical History:  Diagnosis Date   Anxiety    panic attacks   Cancer (HCC)    COPD (chronic obstructive pulmonary disease) (HCC) 03/01/2012   Depression    Emphysema of lung (HCC)    GERD (gastroesophageal reflux disease)    History of radiation therapy    Left Lung- 12/09/20-12/19/20- Dr. Antony Blackbird   History of radiation therapy    Left lung- 05/19/22-05/26/22- Dr. Antony Blackbird   Hyperlipidemia    Mild scoliosis    Oxygen deficiency    Oxygen dependent    Past Surgical History:  Procedure Laterality Date   BREAST BIOPSY Left 06/25/2022   MM LT BREAST BX W LOC DEV 1ST LESION IMAGE BX SPEC STEREO GUIDE 06/25/2022 [facility]   BRONCHIAL BIOPSY  11/04/2020   Procedure: BRONCHIAL BIOPSIES;  Surgeon: Leslye Peer, MD;  Location: [facility] ENDOSCOPY;  Service: Pulmonary;;   BRONCHIAL BRUSHINGS  11/04/2020   Procedure: BRONCHIAL BRUSHINGS;  Surgeon: Leslye Peer, MD;  Location: [facility] ENDOSCOPY;  Service: Pulmonary;;   BRONCHIAL NEEDLE ASPIRATION  BIOPSY  11/04/2020   Procedure: BRONCHIAL NEEDLE ASPIRATION BIOPSIES;  Surgeon: Leslye Peer, MD;  Location: [facility] ENDOSCOPY;  Service: Pulmonary;;   COLONOSCOPY N/A 09/09/2012   Procedure: COLONOSCOPY;  Surgeon: West Bali, MD;  Location: [facility] SUITE;  Service: Endoscopy;  Laterality: N/A;  1:15-moved to 12:45 Melanie notified pt   FIDUCIAL MARKER PLACEMENT  11/04/2020   Procedure: FIDUCIAL MARKER PLACEMENT;  Surgeon: Leslye Peer, MD;  Location: [facility] ENDOSCOPY;  Service: Pulmonary;;   TUBAL LIGATION     VIDEO BRONCHOSCOPY WITH ENDOBRONCHIAL NAVIGATION N/A 11/04/2020   Procedure: ROBOTIC  VIDEO BRONCHOSCOPY WITH ENDOBRONCHIAL NAVIGATION;  Surgeon: Leslye Peer, MD;  Location: [facility] ENDOSCOPY;  Service: Pulmonary;  Laterality: N/A;   Social History   Tobacco Use   Smoking status: Former    Current packs/day: 0.00    Average packs/day: 2.5 packs/day for 30.0 years (75.0 ttl pk-yrs)    Types: Cigarettes    Start date: 06/11/1981    Quit date: 06/12/2011    Years since quitting: 11.1   Smokeless tobacco: Never  Vaping Use   Vaping status: Never Used  Substance Use Topics   Alcohol use: No   Drug use: No   Family History  Problem Relation Age of Onset   Heart disease Father    COPD Mother    Anxiety disorder Mother    Arthritis Sister    Colon cancer Neg Hx  No Known Allergies  Review of Systems  Constitutional:  Negative for chills and fever.  HENT:  Negative for sore throat.   Respiratory:  Negative for cough and shortness of breath.   Cardiovascular:  Negative for chest pain, palpitations and leg swelling.  Gastrointestinal:  Negative for abdominal pain, blood in stool, constipation, diarrhea, nausea and vomiting.  Genitourinary:  Negative for dysuria and hematuria.  Musculoskeletal:  Negative for myalgias.  Skin:  Negative for itching and rash.  Neurological:  Negative for dizziness and headaches.  Psychiatric/Behavioral:  Negative for depression and suicidal ideas.       Objective:     BP 100/66   Pulse 89   Ht 5\' 3"  (1.6 m)   Wt 100 lb (45.4 kg)   LMP 05/06/2010   SpO2 97% Comment: 3L  BMI 17.71 kg/m  BP Readings from Last 3 Encounters:  08/07/22 100/66  08/07/22 100/66  07/02/22 110/64   Physical Exam Vitals reviewed.  Constitutional:      General: She is not in acute distress.    Appearance: She is ill-appearing (Chronically ill-appearing). She is not toxic-appearing.  HENT:     Head: Normocephalic and atraumatic.     Right Ear: External ear normal.     Left Ear: External ear normal.     Nose: Nose normal. No congestion or rhinorrhea.     Mouth/Throat:     Mouth: Mucous membranes are moist.     Pharynx: Oropharynx is clear. No oropharyngeal exudate or posterior oropharyngeal erythema.  Eyes:     General: No scleral icterus.    Extraocular Movements: Extraocular movements intact.     Conjunctiva/sclera: Conjunctivae normal.     Pupils: Pupils are equal, round, and reactive to light.  Cardiovascular:     Rate and Rhythm: Normal rate and regular rhythm.     Pulses: Normal pulses.     Heart sounds: Normal heart sounds. No murmur heard.    No friction rub. No gallop.  Pulmonary:     Effort: Pulmonary effort is normal.     Breath sounds: Normal breath sounds. No wheezing, rhonchi or rales.     Comments: Examined on 2 L [place] Abdominal:     General: Abdomen is flat. Bowel sounds are normal. There is no distension.     Palpations: Abdomen is soft.     Tenderness: There is no abdominal tenderness.  Musculoskeletal:        General: No swelling. Normal range of motion.     Cervical back: Normal range of motion.     Right lower leg: No edema.     Left lower leg: No edema.  Lymphadenopathy:     Cervical: No cervical adenopathy.  Skin:    General: Skin is warm and dry.     Capillary Refill: Capillary refill takes less than 2 seconds.     Coloration: Skin is not jaundiced.     Findings: Lesion (Healing sacral wound.  Bandage intact.)  present.  Neurological:     General: No focal deficit present.     Mental Status: She is alert and oriented to person, place, and time.  Psychiatric:        Mood and Affect: Mood normal.        Behavior: Behavior normal.   Last CBC Lab Results  Component Value Date   WBC 5.9 05/12/2022   HGB 11.4 05/12/2022   HCT 36.3 05/12/2022   MCV 96 05/12/2022   MCH 30.0 05/12/2022   RDW  11.6 (L) 05/12/2022   PLT 268 05/12/2022   Last metabolic panel Lab Results  Component Value Date   GLUCOSE 88 05/12/2022   NA 143 05/12/2022   K 3.8 05/12/2022   CL 97 05/12/2022   CO2 33 (H) 05/12/2022   BUN 12 05/12/2022   CREATININE 0.40 (L) 05/12/2022   EGFR 113 05/12/2022   CALCIUM 9.2 05/12/2022   PHOS 2.3 (L) 03/28/2022   PROT 5.9 (L) 05/12/2022   ALBUMIN 4.0 05/12/2022   LABGLOB 1.9 05/12/2022   AGRATIO 2.1 05/12/2022   BILITOT 0.3 05/12/2022   ALKPHOS 89 05/12/2022   AST 28 05/12/2022   ALT 16 05/12/2022   ANIONGAP NOT CALCULATED 03/28/2022   Last lipids Lab Results  Component Value Date   CHOL 157 05/12/2022   HDL 56 05/12/2022   LDLCALC 82 05/12/2022   TRIG 107 05/12/2022   CHOLHDL 2.8 05/12/2022   Last hemoglobin A1c Lab Results  Component Value Date   HGBA1C 5.4 05/12/2022   Last thyroid functions Lab Results  Component Value Date   TSH 3.340 05/12/2022   Last vitamin D Lab Results  Component Value Date   VD25OH 47.4 05/12/2022   Last vitamin B12 and Folate Lab Results  Component Value Date   VITAMINB12 1,147 05/12/2022   FOLATE >20.0 05/12/2022   The 10-year ASCVD risk score (Arnett DK, et al., 2019) is: 1.7%    Assessment & Plan:   Problem List Items Addressed This Visit       Squamous cell carcinoma of lung, left (HCC) - Primary    She has completed SBRT for an enlarging RLL nodule.  She will follow-up with radiation oncology in late September with repeat scans to be obtained prior to her appointment.  She will also follow-up with oncology at [facility]  in early October.      Generalized anxiety disorder    Previously referred to psychiatry for management of anxiety in the setting of chronic benzodiazepine use.  She declined scheduling an appointment when contacted by psychiatry because she is not interested in tapering off of Xanax. -I again reviewed with Ms. Vowels my concern for chronic Xanax use at its current dose and frequency and recommended establishing care with psychiatry.  Through shared decision making, I will request that her referral is reopened and sent to [facility] per patient request.      Sacral decubitus ulcer    Healing nicely, per wound care.  Dressing is clean, dry, and intact today.  Follow-up is scheduled for 8/14.       Return in about 3 months (around 11/07/2022).    Billie Lade, MD

## 2022-08-07 NOTE — Patient Instructions (Signed)
It was a pleasure to see you today.  Thank you for giving [place] the opportunity to be involved in your care.  Below is a brief recap of your visit and next steps.  We will plan to see you again in 3 months.  Summary No medication changes today Follow up in 3 months Will place psychiatry referral for [facility]

## 2022-08-07 NOTE — Assessment & Plan Note (Signed)
07/31/2016  Patient Saturations on Room Air at Rest = 88% and while Ambulating = 83% - HC03  07/31/2016  = 38  - 07/31/2016   Walked 3lpm  2 laps @ 185 ft each stopped due to  Sob at moderate pace with no desat  - RA  = 85% 01/03/2018 corrected to 92 on 2lpm continuous  -  05/26/2019   Walked 2lpm cont x  approx   200 ft  @ moderate pace  stopped due to  Sob with sats still 91%   -  08/28/2019   Walked 2.5 lpm  approx   400 ft  @ moderate pace  stopped due to  Sob with sats still   90% - 02 sats at rst 85% RA 02/27/2020  - NIV as of 02/10/22 per [facility] - HCO3  05/12/2022    =  33   - 08/07/2022   Walked on RA  x  3  lap(s) =  approx 450  ft  @ slow pace, stopped due to end of study  with lowest 02 sats 89%    Again advised: Make sure you check your oxygen saturation  AT  your highest level of activity (not after you stop)   to be sure it stays over 90% and adjust  02 flow upward to maintain this level if needed but remember to turn it back to previous settings when you stop (to conserve your supply).          Each maintenance medication was reviewed in detail including emphasizing most importantly the difference between maintenance and prns and under what circumstances the prns are to be triggered using an action plan format where appropriate.  Total time for H and P, chart review, counseling, reviewing neb/02/pulse ox  device(s) and generating customized AVS unique to this office visit / same day charting > 30 min

## 2022-08-07 NOTE — Patient Instructions (Addendum)
No change in your medications  Make sure you check your oxygen saturation  AT  your highest level of activity (not after you stop)   to be sure it stays over low  90s% and adjust  02 flow upward to maintain this level if needed but remember to turn it back to previous settings when you stop (to conserve your supply).   Please schedule a follow up visit in 3 months but call sooner if needed 6

## 2022-08-10 ENCOUNTER — Telehealth: Payer: Self-pay | Admitting: Internal Medicine

## 2022-08-10 NOTE — Telephone Encounter (Signed)
April called from [facility] [phone] ext [phone] received a referral on patient. Patient has [employer] primary office needs to do prior authorization before they can see the patient. They will need the authorization code for patient to be seen. This is why the patient has not yet been seen.

## 2022-08-13 ENCOUNTER — Encounter: Payer: Self-pay | Admitting: Internal Medicine

## 2022-08-13 NOTE — Assessment & Plan Note (Signed)
Previously referred to psychiatry for management of anxiety in the setting of chronic benzodiazepine use.  She declined scheduling an appointment when contacted by psychiatry because she is not interested in tapering off of Xanax. -I again reviewed with Donna Watkins my concern for chronic Xanax use at its current dose and frequency and recommended establishing care with psychiatry.  Through shared decision making, I will request that her referral is reopened and sent to [facility] per patient request.

## 2022-08-13 NOTE — Assessment & Plan Note (Signed)
Healing nicely, per wound care.  Dressing is clean, dry, and intact today.  Follow-up is scheduled for 8/14.

## 2022-08-13 NOTE — Assessment & Plan Note (Signed)
She has completed SBRT for an enlarging RLL nodule.  She will follow-up with radiation oncology in late September with repeat scans to be obtained prior to her appointment.  She will also follow-up with oncology at [facility] in early October.

## 2022-08-17 ENCOUNTER — Encounter: Payer: Self-pay | Admitting: *Deleted

## 2022-08-19 DIAGNOSIS — L89159 Pressure ulcer of sacral region, unspecified stage: Secondary | ICD-10-CM | POA: Diagnosis not present

## 2022-08-19 DIAGNOSIS — F419 Anxiety disorder, unspecified: Secondary | ICD-10-CM | POA: Diagnosis not present

## 2022-08-19 DIAGNOSIS — Z79899 Other long term (current) drug therapy: Secondary | ICD-10-CM | POA: Diagnosis not present

## 2022-08-19 DIAGNOSIS — Z87891 Personal history of nicotine dependence: Secondary | ICD-10-CM | POA: Diagnosis not present

## 2022-08-19 DIAGNOSIS — J439 Emphysema, unspecified: Secondary | ICD-10-CM | POA: Diagnosis not present

## 2022-08-19 DIAGNOSIS — E785 Hyperlipidemia, unspecified: Secondary | ICD-10-CM | POA: Diagnosis not present

## 2022-08-19 DIAGNOSIS — Z85118 Personal history of other malignant neoplasm of bronchus and lung: Secondary | ICD-10-CM | POA: Diagnosis not present

## 2022-08-19 DIAGNOSIS — L89154 Pressure ulcer of sacral region, stage 4: Secondary | ICD-10-CM | POA: Diagnosis not present

## 2022-08-26 ENCOUNTER — Other Ambulatory Visit (HOSPITAL_COMMUNITY): Payer: Self-pay | Admitting: Family Medicine

## 2022-08-26 DIAGNOSIS — C3490 Malignant neoplasm of unspecified part of unspecified bronchus or lung: Secondary | ICD-10-CM | POA: Diagnosis not present

## 2022-08-26 DIAGNOSIS — F411 Generalized anxiety disorder: Secondary | ICD-10-CM | POA: Diagnosis not present

## 2022-08-26 DIAGNOSIS — Z13820 Encounter for screening for osteoporosis: Secondary | ICD-10-CM | POA: Diagnosis not present

## 2022-08-26 DIAGNOSIS — J449 Chronic obstructive pulmonary disease, unspecified: Secondary | ICD-10-CM | POA: Diagnosis not present

## 2022-08-26 DIAGNOSIS — E785 Hyperlipidemia, unspecified: Secondary | ICD-10-CM | POA: Diagnosis not present

## 2022-08-26 DIAGNOSIS — K219 Gastro-esophageal reflux disease without esophagitis: Secondary | ICD-10-CM | POA: Diagnosis not present

## 2022-08-28 ENCOUNTER — Other Ambulatory Visit: Payer: Medicare HMO

## 2022-09-01 ENCOUNTER — Ambulatory Visit (HOSPITAL_COMMUNITY)
Admission: RE | Admit: 2022-09-01 | Discharge: 2022-09-01 | Disposition: A | Discharge status: Home / Self Care | Payer: Medicare HMO | Source: Ambulatory Visit | Attending: Family Medicine | Admitting: Family Medicine

## 2022-09-01 DIAGNOSIS — Z13820 Encounter for screening for osteoporosis: Secondary | ICD-10-CM

## 2022-09-01 DIAGNOSIS — M810 Age-related osteoporosis without current pathological fracture: Secondary | ICD-10-CM | POA: Insufficient documentation

## 2022-09-01 DIAGNOSIS — Z780 Asymptomatic menopausal state: Secondary | ICD-10-CM | POA: Diagnosis not present

## 2022-09-08 ENCOUNTER — Telehealth: Payer: Self-pay | Admitting: Internal Medicine

## 2022-09-08 NOTE — Telephone Encounter (Signed)
Questionnaire is in review.

## 2022-09-09 NOTE — Telephone Encounter (Signed)
Procedure: Colonoscopy  Estimated body mass index is 17.71 kg/m as calculated from the following:   Height as of 08/07/22: 5\' 3"  (1.6 m).   Weight as of 08/07/22: 100 lb (45.4 kg).   Have you had a colonoscopy before?  09/09/12, Dr. Darrick Penna  Do you have family history of colon cancer?  no  Do you have a family history of polyps? yes  Previous colonoscopy with polyps removed? yes  Do you have a history colorectal cancer?   no  Are you diabetic?  no  Do you have a prosthetic or mechanical heart valve? no  Do you have a pacemaker/defibrillator?   no  Have you had endocarditis/atrial fibrillation?  no  Do you use supplemental oxygen/CPAP?  yes  Have you had joint replacement within the last 12 months?  no  Do you tend to be constipated or have to use laxatives?  no   Do you have history of alcohol use? If yes, how much and how often.  no  Do you have history or are you using drugs? If yes, what do are you  using?  no  Have you ever had a stroke/heart attack?  no  Have you ever had a heart or other vascular stent placed,?no  Do you take weight loss medication? no  female patients,: have you had a hysterectomy? no                              are you post menopausal?  yes                              do you still have your menstrual cycle? no    Date of last menstrual period? 2007  Do you take any blood-thinning medications such as: (Plavix, aspirin, Coumadin, Aggrenox, Brilinta, Xarelto, Eliquis, Pradaxa, Savaysa or Effient)? no  If yes we need the name, milligram, dosage and who is prescribing doctor:               Current Outpatient Medications  Medication Sig Dispense Refill   albuterol (PROVENTIL) (2.5 MG/3ML) 0.083% nebulizer solution Take 2.5 mg by nebulization See admin instructions. 2.5 mg by nebulization five times daily     albuterol (VENTOLIN HFA) 108 (90 Base) MCG/ACT inhaler Inhale 2 puffs into the lungs every 4 (four) hours as needed for wheezing or shortness  of breath. 1 each 3   ALPRAZolam (XANAX) 1 MG tablet Take 1 tablet (1 mg total) by mouth 3 (three) times daily as needed for anxiety. 30 tablet 0   atorvastatin (LIPITOR) 20 MG tablet Take 1 tablet (20 mg total) by mouth daily. Restart on 04/03/22     feeding supplement (ENSURE ENLIVE / ENSURE PLUS) LIQD Take 237 mLs by mouth 2 (two) times daily between meals. 237 mL 12   ipratropium-albuterol (DUONEB) 0.5-2.5 (3) MG/3ML SOLN Take 3 mLs by nebulization 3 (three) times daily. And as needed for SOB 360 mL 3   Multiple Vitamins-Minerals (CENTRUM SILVER PO) Take 1 tablet by mouth daily.     mupirocin ointment (BACTROBAN) 2 % Apply 1 Application topically.     nutrition supplement, JUVEN, (JUVEN) PACK Take 1 packet by mouth 2 (two) times daily between meals.  0   omeprazole (PRILOSEC) 20 MG capsule Take 20 mg by mouth daily.     OXYGEN Place 3 L into the nose continuous.  No current facility-administered medications for this visit.    No Known Allergies

## 2022-09-15 NOTE — Telephone Encounter (Signed)
Needs OV due to severe COPD

## 2022-09-16 NOTE — Telephone Encounter (Signed)
que

## 2022-09-23 DIAGNOSIS — L89153 Pressure ulcer of sacral region, stage 3: Secondary | ICD-10-CM | POA: Diagnosis not present

## 2022-09-23 DIAGNOSIS — L89159 Pressure ulcer of sacral region, unspecified stage: Secondary | ICD-10-CM | POA: Diagnosis not present

## 2022-09-23 DIAGNOSIS — Z79899 Other long term (current) drug therapy: Secondary | ICD-10-CM | POA: Diagnosis not present

## 2022-09-23 DIAGNOSIS — E785 Hyperlipidemia, unspecified: Secondary | ICD-10-CM | POA: Diagnosis not present

## 2022-09-23 DIAGNOSIS — J439 Emphysema, unspecified: Secondary | ICD-10-CM | POA: Diagnosis not present

## 2022-09-23 DIAGNOSIS — Z87891 Personal history of nicotine dependence: Secondary | ICD-10-CM | POA: Diagnosis not present

## 2022-09-24 ENCOUNTER — Telehealth: Payer: Self-pay | Admitting: *Deleted

## 2022-09-24 ENCOUNTER — Ambulatory Visit (INDEPENDENT_AMBULATORY_CARE_PROVIDER_SITE_OTHER): Payer: Medicare HMO | Admitting: Internal Medicine

## 2022-09-24 ENCOUNTER — Encounter: Payer: Self-pay | Admitting: Internal Medicine

## 2022-09-24 VITALS — BP 138/77 | HR 121 | Temp 98.6°F | Ht 63.0 in | Wt 102.0 lb

## 2022-09-24 DIAGNOSIS — Z1211 Encounter for screening for malignant neoplasm of colon: Secondary | ICD-10-CM | POA: Diagnosis not present

## 2022-09-24 DIAGNOSIS — J9611 Chronic respiratory failure with hypoxia: Secondary | ICD-10-CM | POA: Diagnosis not present

## 2022-09-24 DIAGNOSIS — K219 Gastro-esophageal reflux disease without esophagitis: Secondary | ICD-10-CM | POA: Diagnosis not present

## 2022-09-24 DIAGNOSIS — K828 Other specified diseases of gallbladder: Secondary | ICD-10-CM | POA: Diagnosis not present

## 2022-09-24 DIAGNOSIS — J9612 Chronic respiratory failure with hypercapnia: Secondary | ICD-10-CM

## 2022-09-24 NOTE — Telephone Encounter (Signed)
Patient is needing Pulmonary Clearance.  What type of surgery is being performed? Colonoscopy  When is surgery scheduled? TBD  Name of physician performing surgery?  Dr. Earnest Bailey [facility] at [facility] Phone: [phone] Fax: [phone]  Anethesia type (none, local, MAC, general)? MAC

## 2022-09-24 NOTE — Patient Instructions (Signed)
We will reach out to Dr. Sherene Sires for preoperative clearance in regards to undergoing colonoscopy.  Once we have heard back, we will schedule you for colonoscopy.  Continue on omeprazole daily for your chronic reflux.  Continue to follow with wound care in regards to your sacral decubitus ulcer.  It was very nice meeting you today.  Dr. Marletta Lor

## 2022-09-24 NOTE — Progress Notes (Signed)
Primary Care Physician:  Benita Stabile, MD Primary Gastroenterologist:  Dr. Marletta Lor  Chief Complaint  Patient presents with   New Patient (Initial Visit)    Pt referred for ov before colonoscopy--COPD    HPI:   Donna Watkins is a 62 y.o. female who presents to clinic today to discuss colonoscopy.  She has a history of COPD, chronic respiratory failure on 3 L, lung cancer status post XRT, anxiety, hyperlipidemia.  Colonoscopy 09/09/2012 3 small polyps in the sigmoid colon and rectum.  Internal and external hemorrhoids, sigmoid diverticulosis.  Pathology of polyps benign.  Denies any family history of colorectal malignancy.  No melena hematochezia.  No abdominal pain or unintentional weight loss.  Does have chronic GERD which is well-controlled on omeprazole 20 mg daily.  No dysphagia odynophagia, no epigastric or chest pain.  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  She is completely asymptomatic from this  standpoint.  Denies any abdominal pain, nausea, diarrhea, abdominal bloating.  Past Medical History:  Diagnosis Date   Anxiety    panic attacks   Cancer (HCC)    COPD (chronic obstructive pulmonary disease) (HCC) 03/01/2012   Depression    Emphysema of lung (HCC)    GERD (gastroesophageal reflux disease)    History of radiation therapy    Left Lung- 12/09/20-12/19/20- Dr. Antony Blackbird   History of radiation therapy    Left lung- 05/19/22-05/26/22- Dr. Antony Blackbird   Hyperlipidemia    Mild scoliosis    Oxygen deficiency    Oxygen dependent     Past Surgical History:  Procedure Laterality Date   BREAST BIOPSY Left 06/25/2022   MM LT BREAST BX W LOC DEV 1ST LESION IMAGE BX SPEC STEREO GUIDE 06/25/2022 [facility]   BRONCHIAL BIOPSY  11/04/2020   Procedure: BRONCHIAL BIOPSIES;  Surgeon: Leslye Peer, MD;  Location: [facility] ENDOSCOPY;  Service: Pulmonary;;   BRONCHIAL BRUSHINGS  11/04/2020   Procedure:  BRONCHIAL BRUSHINGS;  Surgeon: Leslye Peer, MD;  Location: [facility] ENDOSCOPY;  Service: Pulmonary;;   BRONCHIAL NEEDLE ASPIRATION BIOPSY  11/04/2020   Procedure: BRONCHIAL NEEDLE ASPIRATION BIOPSIES;  Surgeon: Leslye Peer, MD;  Location: [facility] ENDOSCOPY;  Service: Pulmonary;;   COLONOSCOPY N/A 09/09/2012   Procedure: COLONOSCOPY;  Surgeon: West Bali, MD;  Location: [facility];  Service: Endoscopy;  Laterality: N/A;  1:15-moved to 12:45 Melanie notified pt   FIDUCIAL MARKER PLACEMENT  11/04/2020   Procedure: FIDUCIAL MARKER PLACEMENT;  Surgeon: Leslye Peer, MD;  Location: [facility] ENDOSCOPY;  Service: Pulmonary;;   TUBAL LIGATION     VIDEO BRONCHOSCOPY WITH ENDOBRONCHIAL NAVIGATION N/A 11/04/2020   Procedure: ROBOTIC  VIDEO BRONCHOSCOPY WITH ENDOBRONCHIAL NAVIGATION;  Surgeon: Leslye Peer, MD;  Location: [facility] ENDOSCOPY;  Service: Pulmonary;  Laterality: N/A;    Current Outpatient Medications  Medication Sig Dispense Refill   albuterol (PROVENTIL) (2.5 MG/3ML) 0.083% nebulizer solution Take 2.5 mg by nebulization See admin instructions. 2.5 mg by nebulization five times daily     albuterol (VENTOLIN HFA) 108 (90 Base) MCG/ACT inhaler Inhale 2 puffs into the lungs every 4 (four) hours as needed for wheezing or shortness of breath. 1 each 3   ALPRAZolam (XANAX) 1 MG tablet Take 1 tablet (1 mg total) by mouth 3 (three) times daily as needed for anxiety. 30 tablet 0   atorvastatin (LIPITOR) 20 MG tablet Take 1 tablet (20 mg total) by mouth daily. Restart on 04/03/22  feeding supplement (ENSURE ENLIVE / ENSURE PLUS) LIQD Take 237 mLs by mouth 2 (two) times daily between meals. 237 mL 12   ipratropium-albuterol (DUONEB) 0.5-2.5 (3) MG/3ML SOLN Take 3 mLs by nebulization 3 (three) times daily. And as needed for SOB 360 mL 3   Multiple Vitamins-Minerals (CENTRUM SILVER PO) Take 1 tablet by mouth daily.     nutrition supplement, JUVEN, (JUVEN) PACK Take 1 packet by mouth 2 (two) times daily between  meals.  0   omeprazole (PRILOSEC) 20 MG capsule Take 20 mg by mouth daily.     OXYGEN Place 3 L into the nose continuous.     mupirocin ointment (BACTROBAN) 2 % Apply 1 Application topically. (Patient not taking: Reported on 09/24/2022)     No current facility-administered medications for this visit.    Allergies as of 09/24/2022   (No Known Allergies)    Family History  Problem Relation Age of Onset   Heart disease Father    COPD Mother    Anxiety disorder Mother    Arthritis Sister    Colon cancer Neg Hx     Social History   Socioeconomic History   Marital status: Divorced    Spouse name: Not on file   Number of children: Not on file   Years of education: Not on file   Highest education level: 10th grade  Occupational History   Occupation: Disabled  Tobacco Use   Smoking status: Former    Current packs/day: 0.00    Average packs/day: 2.5 packs/day for 30.0 years (75.0 ttl pk-yrs)    Types: Cigarettes    Start date: 06/11/1981    Quit date: 06/12/2011    Years since quitting: 11.2   Smokeless tobacco: Never  Vaping Use   Vaping status: Never Used  Substance and Sexual Activity   Alcohol use: No   Drug use: No   Sexual activity: Not Currently    Birth control/protection: Post-menopausal, Surgical    Comment: tubal ligation  Other Topics Concern   Not on file  Social History Narrative   Disability.    Lives with sister   Social Determinants of Health   Financial Resource Strain: Low Risk  (08/03/2022)   Overall Financial Resource Strain (CARDIA)    Difficulty of Paying Living Expenses: Not hard at all  Food Insecurity: No Food Insecurity (08/03/2022)   Hunger Vital Sign    Worried About Running Out of Food in the Last Year: Never true    Ran Out of Food in the Last Year: Never true  Recent Concern: Food Insecurity - Food Insecurity Present (06/23/2022)   Hunger Vital Sign    Worried About Running Out of Food in the Last Year: Sometimes true    Ran Out of Food  in the Last Year: Never true  Transportation Needs: No Transportation Needs (08/03/2022)   PRAPARE - Administrator, Civil Service (Medical): No    Lack of Transportation (Non-Medical): No  Physical Activity: Insufficiently Active (08/03/2022)   Received from [facility]   Exercise Vital Sign    Days of Exercise per Week: 3 days    Minutes of Exercise per Session: 20 min  Stress: No Stress Concern Present (08/03/2022)   [employer] of Occupational Health - Occupational Stress Questionnaire    Feeling of Stress : Only a little  Social Connections: Socially Isolated (08/03/2022)   Social Connection and Isolation Panel [NHANES]    Frequency of Communication with Friends and Family: Three  times a week    Frequency of Social Gatherings with Friends and Family: Twice a week    Attends Religious Services: Never    Database administrator or Organizations: No    Attends Banker Meetings: Never    Marital Status: Divorced  Catering manager Violence: Not At Risk (08/03/2022)   Received from [facility]   Humiliation, Afraid, Rape, and Kick questionnaire    Fear of Current or Ex-Partner: No    Emotionally Abused: No    Physically Abused: No    Sexually Abused: No    Subjective: Review of Systems  Constitutional:  Negative for chills and fever.  HENT:  Negative for congestion and hearing loss.   Eyes:  Negative for blurred vision and double vision.  Respiratory:  Positive for shortness of breath. Negative for cough.   Cardiovascular:  Negative for chest pain and palpitations.  Gastrointestinal:  Negative for abdominal pain, blood in stool, constipation, diarrhea, heartburn, melena and vomiting.  Genitourinary:  Negative for dysuria and urgency.  Musculoskeletal:  Negative for joint pain and myalgias.  Skin:  Negative for itching and rash.  Neurological:  Negative for dizziness and headaches.  Psychiatric/Behavioral:  Negative for depression. The patient  is not nervous/anxious.        Objective: BP 138/77   Pulse (!) 121   Temp 98.6 F (37 C)   Ht 5\' 3"  (1.6 m)   Wt 102 lb (46.3 kg)   LMP 05/06/2010   BMI 18.07 kg/m  Physical Exam Constitutional:      Appearance: Normal appearance.  HENT:     Head: Normocephalic and atraumatic.  Eyes:     Extraocular Movements: Extraocular movements intact.     Conjunctiva/sclera: Conjunctivae normal.  Cardiovascular:     Rate and Rhythm: Normal rate and regular rhythm.  Pulmonary:     Effort: Pulmonary effort is normal.     Breath sounds: Wheezing present.  Abdominal:     General: Bowel sounds are normal.     Palpations: Abdomen is soft.  Musculoskeletal:        General: No swelling. Normal range of motion.     Cervical back: Normal range of motion and neck supple.  Skin:    General: Skin is warm and dry.     Coloration: Skin is not jaundiced.  Neurological:     General: No focal deficit present.     Mental Status: She is alert and oriented to person, place, and time.  Psychiatric:        Mood and Affect: Mood normal.        Behavior: Behavior normal.      Assessment: *Chronic GERD-well-controlled on omeprazole *Colon cancer screening *Chronic respiratory failure *Porcelain gallbladder  Plan: Chronic GERD well-controlled omeprazole daily.  Will continue.  Patient due for colonoscopy for colon cancer screening purposes.  Given her significant underlying COPD, chronic respiratory failure, history of lung cancer status post XRT we will reach out to patient's pulmonologist in regards to anesthesia risk.  Appreciate Dr. Thurston Hole help immensely in this regard.  Patient currently asymptomatic from her porcelain gallbladder.  Continue to monitor.  Was evaluated by general surgery during her hospital admission in this regard.  Thank you Dr. Margo Aye for the kind referral    09/24/2022 2:14 PM   Disclaimer: This note was dictated with voice recognition software. Similar sounding  words can inadvertently be transcribed and may not be corrected upon review.

## 2022-09-28 NOTE — Telephone Encounter (Signed)
She is on NIV at baseline (non invasive vent) so would need to be done a hospital setting with anesthesia there but if so can go ahead with surgery

## 2022-10-01 ENCOUNTER — Ambulatory Visit (HOSPITAL_COMMUNITY)
Admission: RE | Admit: 2022-10-01 | Discharge: 2022-10-01 | Disposition: A | Discharge status: Home / Self Care | Payer: Medicare HMO | Source: Ambulatory Visit | Attending: Radiology | Admitting: Radiology

## 2022-10-01 DIAGNOSIS — R911 Solitary pulmonary nodule: Secondary | ICD-10-CM | POA: Diagnosis not present

## 2022-10-01 DIAGNOSIS — I700 Atherosclerosis of aorta: Secondary | ICD-10-CM | POA: Diagnosis not present

## 2022-10-01 DIAGNOSIS — J432 Centrilobular emphysema: Secondary | ICD-10-CM | POA: Diagnosis not present

## 2022-10-01 DIAGNOSIS — Z13820 Encounter for screening for osteoporosis: Secondary | ICD-10-CM | POA: Diagnosis not present

## 2022-10-01 DIAGNOSIS — E559 Vitamin D deficiency, unspecified: Secondary | ICD-10-CM | POA: Diagnosis not present

## 2022-10-01 DIAGNOSIS — E785 Hyperlipidemia, unspecified: Secondary | ICD-10-CM | POA: Diagnosis not present

## 2022-10-01 DIAGNOSIS — C3490 Malignant neoplasm of unspecified part of unspecified bronchus or lung: Secondary | ICD-10-CM | POA: Diagnosis not present

## 2022-10-02 ENCOUNTER — Ambulatory Visit (HOSPITAL_COMMUNITY): Payer: Medicare HMO

## 2022-10-04 NOTE — Progress Notes (Signed)
Radiation Oncology         [phone] ________________________________  Name: Donna Watkins MRN: [id number]  Date: 10/05/2022  DOB: [birth year]  Follow-Up Visit Note  CC: Benita Stabile, MD  Towana Badger, MD  No diagnosis found.  Diagnosis: The primary encounter diagnosis was Malignant neoplasm of lower lobe of right lung. A diagnosis of Nodule of lower lobe of right lung was also pertinent to this visit.   Enlarging lesion of the right lower lobe    Squamous cell carcinoma of the LUL, Clinical Stage IA-3, (T1c, N0, M0)    Interval Since Last Radiation: 4 months and 9 days   Indication for treatment: Curative       Radiation treatment dates: 05/19/22 through 05/26/22  Site/dose: Right lung - 54 Gy delivered in 3 Fx at 18 Gy/Fx Beams/energy:  6X-FFF Technique/Mode: SBRT/SRT-IMRT / Photon  Narrative:  The patient returns today for routine follow-up and to review recent imaging. She was last seen here for follow-up on 07/02/22.    Since her last visit, she has continued to follow-up with [facility] medical oncology. She most recently followed up with them on 07/08/22 and was noted to be doing well at that time other than intermittent GI issues, specifically constipation alternating with diarrhea (followed by GI).   Her most recent chest CT on 10/01/22 demonstrates: ***.       Other imaging performed in the interval includes a DXA for bone mineral density on 09/01/22 which classified the patient as osteoporotic.         ***                   Allergies:  has No Known Allergies.  Meds: Current Outpatient Medications  Medication Sig Dispense Refill   albuterol (PROVENTIL) (2.5 MG/3ML) 0.083% nebulizer solution Take 2.5 mg by nebulization See admin instructions. 2.5 mg by nebulization five times daily     albuterol (VENTOLIN HFA) 108 (90 Base) MCG/ACT inhaler Inhale 2 puffs into the lungs every 4 (four) hours as needed for wheezing or shortness of breath. 1 each 3    ALPRAZolam (XANAX) 1 MG tablet Take 1 tablet (1 mg total) by mouth 3 (three) times daily as needed for anxiety. 30 tablet 0   atorvastatin (LIPITOR) 20 MG tablet Take 1 tablet (20 mg total) by mouth daily. Restart on 04/03/22     feeding supplement (ENSURE ENLIVE / ENSURE PLUS) LIQD Take 237 mLs by mouth 2 (two) times daily between meals. 237 mL 12   ipratropium-albuterol (DUONEB) 0.5-2.5 (3) MG/3ML SOLN Take 3 mLs by nebulization 3 (three) times daily. And as needed for SOB 360 mL 3   Multiple Vitamins-Minerals (CENTRUM SILVER PO) Take 1 tablet by mouth daily.     mupirocin ointment (BACTROBAN) 2 % Apply 1 Application topically. (Patient not taking: Reported on 09/24/2022)     nutrition supplement, JUVEN, (JUVEN) PACK Take 1 packet by mouth 2 (two) times daily between meals.  0   omeprazole (PRILOSEC) 20 MG capsule Take 20 mg by mouth daily.     OXYGEN Place 3 L into the nose continuous.     No current facility-administered medications for this encounter.    Physical Findings: The patient is in no acute distress. Patient is alert and oriented.  vitals were not taken for this visit. .  No significant changes. Lungs are clear to auscultation bilaterally. Heart has regular rate and rhythm. No palpable cervical, supraclavicular, or axillary adenopathy. Abdomen soft, non-tender,  normal bowel sounds.   Lab Findings: Lab Results  Component Value Date   WBC 5.9 05/12/2022   HGB 11.4 05/12/2022   HCT 36.3 05/12/2022   MCV 96 05/12/2022   PLT 268 05/12/2022    Radiographic Findings: No results found.  Impression:  The primary encounter diagnosis was Malignant neoplasm of lower lobe of right lung. A diagnosis of Nodule of lower lobe of right lung was also pertinent to this visit.   Enlarging lesion of the right lower lobe    Squamous cell carcinoma of the LUL, Clinical Stage IA-3, (T1c, N0, M0)    The patient is recovering from the effects of radiation.  ***  Plan:  ***   *** minutes  of total time was spent for this patient encounter, including preparation, face-to-face counseling with the patient and coordination of care, physical exam, and documentation of the encounter. ____________________________________  Billie Lade, PhD, MD  This document serves as a record of services personally performed by Antony Blackbird, MD. It was created on his behalf by Neena Rhymes, a trained medical scribe. The creation of this record is based on the scribe's personal observations and the provider's statements to them. This document has been checked and approved by the attending provider.

## 2022-10-05 ENCOUNTER — Encounter: Payer: Self-pay | Admitting: *Deleted

## 2022-10-05 ENCOUNTER — Ambulatory Visit
Admission: RE | Admit: 2022-10-05 | Discharge: 2022-10-05 | Disposition: A | Discharge status: Home / Self Care | Payer: Medicare HMO | Source: Ambulatory Visit | Attending: Radiation Oncology | Admitting: Radiation Oncology

## 2022-10-05 VITALS — BP 113/86 | HR 117 | Temp 97.9°F | Resp 20 | Ht 63.0 in | Wt 100.0 lb

## 2022-10-05 DIAGNOSIS — C3431 Malignant neoplasm of lower lobe, right bronchus or lung: Secondary | ICD-10-CM

## 2022-10-05 DIAGNOSIS — J432 Centrilobular emphysema: Secondary | ICD-10-CM | POA: Diagnosis not present

## 2022-10-05 DIAGNOSIS — Z87891 Personal history of nicotine dependence: Secondary | ICD-10-CM | POA: Diagnosis not present

## 2022-10-05 DIAGNOSIS — R911 Solitary pulmonary nodule: Secondary | ICD-10-CM | POA: Insufficient documentation

## 2022-10-05 DIAGNOSIS — Z85118 Personal history of other malignant neoplasm of bronchus and lung: Secondary | ICD-10-CM | POA: Diagnosis not present

## 2022-10-05 DIAGNOSIS — M810 Age-related osteoporosis without current pathological fracture: Secondary | ICD-10-CM | POA: Diagnosis not present

## 2022-10-05 DIAGNOSIS — I700 Atherosclerosis of aorta: Secondary | ICD-10-CM | POA: Diagnosis not present

## 2022-10-05 DIAGNOSIS — Z923 Personal history of irradiation: Secondary | ICD-10-CM | POA: Insufficient documentation

## 2022-10-05 DIAGNOSIS — C3492 Malignant neoplasm of unspecified part of left bronchus or lung: Secondary | ICD-10-CM

## 2022-10-05 DIAGNOSIS — Z79899 Other long term (current) drug therapy: Secondary | ICD-10-CM | POA: Diagnosis not present

## 2022-10-05 MED ORDER — PEG 3350-KCL-NA BICARB-NACL 420 G PO SOLR: 4000.0000 mL | Freq: Once | ORAL | 0 refills | Status: AC

## 2022-10-05 NOTE — Progress Notes (Signed)
Donna Watkins is here today for follow up post radiation to the lung, and to receive CT scan results from 10/01/2022. Radiation treatment dates: 05/19/22 through 05/26/22  Lung Side: Right  Does the patient complain of any of the following: Pain: Denies chest pain. Continues to deal with chronic back pain Shortness of breath w/wo exertion: SOB with activity and continues to wear supplemental O2.  Cough: Denies Hemoptysis: Denies Pain with swallowing: Denies Swallowing/choking concerns: Denies Appetite: Reports a healthy appetite  Wt Readings from Last 3 Encounters:  10/05/22 100 lb (45.4 kg)  09/24/22 102 lb (46.3 kg)  08/07/22 100 lb (45.4 kg)   Energy Level: Reports stable energy (denies any new or worsening fatigue) Post radiation skin changes: Denies  Additional comments if applicable: Last saw pulmonologist Dr. Sherene Sires on 08/07/2022. Scheduled for colonoscopy in early November of this year

## 2022-10-05 NOTE — Telephone Encounter (Signed)
PA approved via cohere Authorization #[id number], DOS: 11/09/2022 - 01/05/2023

## 2022-10-06 NOTE — Telephone Encounter (Signed)
Okay to schedule, ASA 3.  Thank you

## 2022-10-06 NOTE — Telephone Encounter (Signed)
Noted patient already scheduled

## 2022-10-07 DIAGNOSIS — K219 Gastro-esophageal reflux disease without esophagitis: Secondary | ICD-10-CM | POA: Diagnosis not present

## 2022-10-07 DIAGNOSIS — Z13820 Encounter for screening for osteoporosis: Secondary | ICD-10-CM | POA: Diagnosis not present

## 2022-10-07 DIAGNOSIS — C3490 Malignant neoplasm of unspecified part of unspecified bronchus or lung: Secondary | ICD-10-CM | POA: Diagnosis not present

## 2022-10-07 DIAGNOSIS — J449 Chronic obstructive pulmonary disease, unspecified: Secondary | ICD-10-CM | POA: Diagnosis not present

## 2022-10-07 DIAGNOSIS — R7301 Impaired fasting glucose: Secondary | ICD-10-CM | POA: Diagnosis not present

## 2022-10-07 DIAGNOSIS — E785 Hyperlipidemia, unspecified: Secondary | ICD-10-CM | POA: Diagnosis not present

## 2022-10-07 DIAGNOSIS — F411 Generalized anxiety disorder: Secondary | ICD-10-CM | POA: Diagnosis not present

## 2022-10-07 DIAGNOSIS — Z23 Encounter for immunization: Secondary | ICD-10-CM | POA: Diagnosis not present

## 2022-10-14 DIAGNOSIS — F419 Anxiety disorder, unspecified: Secondary | ICD-10-CM | POA: Diagnosis not present

## 2022-10-14 DIAGNOSIS — L89159 Pressure ulcer of sacral region, unspecified stage: Secondary | ICD-10-CM | POA: Diagnosis not present

## 2022-10-14 DIAGNOSIS — Z79899 Other long term (current) drug therapy: Secondary | ICD-10-CM | POA: Diagnosis not present

## 2022-10-14 DIAGNOSIS — E785 Hyperlipidemia, unspecified: Secondary | ICD-10-CM | POA: Diagnosis not present

## 2022-10-14 DIAGNOSIS — J439 Emphysema, unspecified: Secondary | ICD-10-CM | POA: Diagnosis not present

## 2022-10-14 DIAGNOSIS — Z87891 Personal history of nicotine dependence: Secondary | ICD-10-CM | POA: Diagnosis not present

## 2022-10-14 DIAGNOSIS — C3490 Malignant neoplasm of unspecified part of unspecified bronchus or lung: Secondary | ICD-10-CM | POA: Diagnosis not present

## 2022-10-14 DIAGNOSIS — Z7951 Long term (current) use of inhaled steroids: Secondary | ICD-10-CM | POA: Diagnosis not present

## 2022-11-04 NOTE — Patient Instructions (Signed)
Donna Watkins  11/04/2022     @PREFPERIOPPHARMACY @   Your procedure is scheduled on 11/09/2022.   Report to [facility] at 6:45 A.M.   Call this number if you have problems the morning of surgery:  [phone]  If you experience any cold or flu symptoms such as cough, fever, chills, shortness of breath, etc. between now and your scheduled surgery, please notify us at the above number.   Remember:  . Please follow the diet and prep instructions given to you by Dr Queen Blossom office.     You may drink clear liquids until 3:45 am the morning of the procedure.     .  Clear liquids allowed are:                    Water, Carbonated beverages (diabetics please choose diet or no sugar options), Clear Tea (No creamer, milk, or cream, including half & half and powdered creamer), Black Coffee Only (No creamer, milk or cream, including half & half and powdered creamer), and Clear Sports drink (No red color; diabetics please choose diet or no sugar options)    Take these medicines the morning of surgery with A SIP OF WATER : Omeprazole and you may take your Xanax if needed.     Do not wear jewelry, make-up or nail polish, including gel polish,  artificial nails, or any other type of covering on natural nails (fingers and  toes).  Do not wear lotions, powders, or perfumes, or deodorant.  Do not shave 48 hours prior to surgery.  Men may shave face and neck.  Do not bring valuables to the hospital.  [facility] is not responsible for any belongings or valuables.  Contacts, dentures or bridgework may not be worn into surgery.  Leave your suitcase in the car.  After surgery it may be brought to your room.  For patients admitted to the hospital, discharge time will be determined by your treatment team.  Patients discharged the day of surgery will not be allowed to drive home.   Name and phone number of your driver:   family Special instructions:  N/A  Please read over the following fact  sheets that you were given. Care and Recovery After Surgery  Colonoscopy, Adult A colonoscopy is a procedure to look at the entire large intestine. This procedure is done using a long, thin, flexible tube that has a camera on the end. You may have a colonoscopy: As a part of normal colorectal screening. If you have certain symptoms, such as: A low number of red blood cells in your blood (anemia). Diarrhea that does not go away. Pain in your abdomen. Blood in your stool. A colonoscopy can help screen for and diagnose medical problems, including: An abnormal growth of cells or tissue (tumor). Abnormal growths within the lining of your intestine (polyps). Inflammation. Areas of bleeding. Tell your health care provider about: Any allergies you have. All medicines you are taking, including vitamins, herbs, eye drops, creams, and over-the-counter medicines. Any problems you or family members have had with anesthetic medicines. Any bleeding problems you have. Any surgeries you have had. Any medical conditions you have. Any problems you have had with having bowel movements. Whether you are pregnant or may be pregnant. What are the risks? Generally, this is a safe procedure. However, problems may occur, including: Bleeding. Damage to your intestine. Allergic reactions to medicines given during the procedure. Infection. This is rare. What happens before the procedure?  Eating and drinking restrictions Follow instructions from your health care provider about eating or drinking restrictions, which may include: A few days before the procedure: Follow a low-fiber diet. Avoid nuts, seeds, dried fruit, raw fruits, and vegetables. 1-3 days before the procedure: Eat only gelatin dessert or ice pops. Drink only clear liquids, such as water, clear juice, clear broth or bouillon, black coffee or tea, or clear soft drinks or sports drinks. Avoid liquids that contain red or purple dye. The day of  the procedure: Do not eat solid foods. You may continue to drink clear liquids until up to 2 hours before the procedure. Do not eat or drink anything starting 2 hours before the procedure, or within the time period that your health care provider recommends. Bowel prep If you were prescribed a bowel prep to take by mouth (orally) to clean out your colon: Take it as told by your health care provider. Starting the day before your procedure, you will need to drink a large amount of liquid medicine. The liquid will cause you to have many bowel movements of loose stool until your stool becomes almost clear or light green. If your skin or the opening between the buttocks (anus) gets irritated from diarrhea, you may relieve the irritation using: Wipes with medicine in them, such as adult wet wipes with aloe and vitamin E. A product to soothe skin, such as petroleum jelly. If you vomit while drinking the bowel prep: Take a break for up to 60 minutes. Begin the bowel prep again. Call your health care provider if you keep vomiting or you cannot take the bowel prep without vomiting. To clean out your colon, you may also be given: Laxative medicines. These help you have a bowel movement. Instructions for enema use. An enema is liquid medicine injected into your rectum. Medicines Ask your health care provider about: Changing or stopping your regular medicines or supplements. This is especially important if you are taking iron supplements, diabetes medicines, or blood thinners. Taking medicines such as aspirin and ibuprofen. These medicines can thin your blood. Do not take these medicines unless your health care provider tells you to take them. Taking over-the-counter medicines, vitamins, herbs, and supplements. General instructions Ask your health care provider what steps will be taken to help prevent infection. These may include washing skin with a germ-killing soap. If you will be going home right after  the procedure, plan to have a responsible adult: Take you home from the hospital or clinic. You will not be allowed to drive. Care for you for the time you are told. What happens during the procedure?  An IV will be inserted into one of your veins. You will be given a medicine to make you fall asleep (general anesthetic). You will lie on your side with your knees bent. A lubricant will be put on the tube. Then the tube will be: Inserted into your anus. Gently eased through all parts of your large intestine. Air will be sent into your colon to keep it open. This may cause some pressure or cramping. Images will be taken with the camera and will appear on a screen. A small tissue sample may be removed to be looked at under a microscope (biopsy). The tissue may be sent to a lab for testing if any signs of problems are found. If small polyps are found, they may be removed and checked for cancer cells. When the procedure is finished, the tube will be removed. The procedure may vary  among health care providers and hospitals. What happens after the procedure? Your blood pressure, heart rate, breathing rate, and blood oxygen level will be monitored until you leave the hospital or clinic. You may have a small amount of blood in your stool. You may pass gas and have mild cramping or bloating in your abdomen. This is caused by the air that was used to open your colon during the exam. If you were given a sedative during the procedure, it can affect you for several hours. Do not drive or operate machinery until your health care provider says that it is safe. It is up to you to get the results of your procedure. Ask your health care provider, or the department that is doing the procedure, when your results will be ready. Summary A colonoscopy is a procedure to look at the entire large intestine. Follow instructions from your health care provider about eating and drinking before the procedure. If you were  prescribed an oral bowel prep to clean out your colon, take it as told by your health care provider. During the colonoscopy, a flexible tube with a camera on its end is inserted into the anus and then passed into all parts of the large intestine. This information is not intended to replace advice given to you by your health care provider. Make sure you discuss any questions you have with your health care provider. Document Revised: 02/03/2022 Document Reviewed: 08/14/2020 Elsevier Patient Education  2024 Else[employer] Inc[employer]  Monitored Anesthesia Care Anesthesia refers to the techniques, procedures, and medicines that help a person stay safe and comfortable during surgery. Monitored anesthesia care, or sedation, is one type of anesthesia. You may have sedation if you do not need to be asleep for your procedure. Procedures that use sedation may include: Surgery to remove cataracts from your eyes. A dental procedure. A biopsy. This is when a tissue sample is removed and looked at under a microscope. You will be watched closely during your procedure. Your level of sedation or type of anesthesia may be changed to fit your needs. Tell a health care provider about: Any allergies you have. All medicines you are taking, including vitamins, herbs, eye drops, creams, and over-the-counter medicines. Any problems you or family members have had with anesthesia. Any bleeding problems you have. Any surgeries you have had. Any medical conditions or illnesses you have. This includes sleep apnea, cough, fever, or the flu. Whether you are pregnant or may be pregnant. Whether you use cigarettes, alcohol, or drugs. Any use of steroids, whether by mouth or as a cream. What are the risks? Your health care provider will talk with you about risks. These may include: Getting too much medicine (oversedation). Nausea. Allergic reactions to medicines. Trouble breathing. If this happens, a breathing tube may be used to help  you breathe. It will be removed when you are awake and breathing on your own. Heart trouble. Lung trouble. Confusion that gets better with time (emergence delirium). What happens before the procedure? When to stop eating and drinking Follow instructions from your health care provider about what you may eat and drink. These may include: 8 hours before your procedure Stop eating most foods. Do not eat meat, fried foods, or fatty foods. Eat only light foods, such as toast or crackers. All liquids are okay except energy drinks and alcohol. 6 hours before your procedure Stop eating. Drink only clear liquids, such as water, clear fruit juice, black coffee, plain tea, and sports drinks. Do not  drink energy drinks or alcohol. 2 hours before your procedure Stop drinking all liquids. You may be allowed to take medicines with small sips of water. If you do not follow your health care provider's instructions, your procedure may be delayed or canceled. Medicines Ask your health care provider about: Changing or stopping your regular medicines. These include any diabetes medicines or blood thinners you take. Taking medicines such as aspirin and ibuprofen. These medicines can thin your blood. Do not take them unless your health care provider tells you to. Taking over-the-counter medicines, vitamins, herbs, and supplements. Testing You may have an exam or testing. You may have a blood or urine sample taken. General instructions Do not use any products that contain nicotine or tobacco for at least 4 weeks before the procedure. These products include cigarettes, chewing tobacco, and vaping devices, such as e-cigarettes. If you need help quitting, ask your health care provider. If you will be going home right after the procedure, plan to have a responsible adult: Take you home from the hospital or clinic. You will not be allowed to drive. Care for you for the time you are told. What happens during the  procedure?  Your blood pressure, heart rate, breathing, level of pain, and blood oxygen level will be monitored. An IV will be inserted into one of your veins. You may be given: A sedative. This helps you relax. Anesthesia. This will: Numb certain areas of your body. Make you fall asleep for surgery. You will be given medicines as needed to keep you comfortable. The more medicine you are given, the deeper your level of sedation will be. Your level of sedation may be changed to fit your needs. There are three levels of sedation: Mild sedation. At this level, you may feel awake and relaxed. You will be able to follow directions. Moderate sedation. At this level, you will be sleepy. You may not remember the procedure. Deep sedation. At this level, you will be asleep. You will not remember the procedure. How you get the medicines will depend on your age and the procedure. They may be given as: A pill. This may be taken by mouth (orally) or inserted into the rectum. An injection. This may be into a vein or muscle. A spray through the nose. After your procedure is over, the medicine will be stopped. The procedure may vary among health care providers and hospitals. What happens after the procedure? Your blood pressure, heart rate, breathing rate, and blood oxygen level will be monitored until you leave the hospital or clinic. You may feel sleepy, clumsy, or nauseous. You may not remember what happened during or after the procedure. Sedation can affect you for several hours. Do not drive or use machinery until your health care provider says that it is safe. This information is not intended to replace advice given to you by your health care provider. Make sure you discuss any questions you have with your health care provider. Document Revised: 05/19/2021 Document Reviewed: 05/19/2021 Elsevier Patient Education  2024 [employer].

## 2022-11-05 ENCOUNTER — Encounter (HOSPITAL_COMMUNITY)
Admission: RE | Admit: 2022-11-05 | Discharge: 2022-11-05 | Disposition: A | Discharge status: Home / Self Care | Payer: Medicare HMO | Source: Ambulatory Visit | Attending: Internal Medicine | Admitting: Internal Medicine

## 2022-11-05 ENCOUNTER — Other Ambulatory Visit: Payer: Self-pay

## 2022-11-05 ENCOUNTER — Encounter (HOSPITAL_COMMUNITY): Payer: Self-pay

## 2022-11-05 VITALS — BP 109/57 | HR 83 | Temp 98.0°F | Resp 18 | Ht 63.0 in | Wt 100.0 lb

## 2022-11-05 DIAGNOSIS — Z01818 Encounter for other preprocedural examination: Secondary | ICD-10-CM

## 2022-11-05 DIAGNOSIS — Z01810 Encounter for preprocedural cardiovascular examination: Secondary | ICD-10-CM | POA: Diagnosis not present

## 2022-11-09 ENCOUNTER — Ambulatory Visit: Payer: Medicare HMO | Admitting: Internal Medicine

## 2022-11-09 ENCOUNTER — Ambulatory Visit (HOSPITAL_COMMUNITY): Payer: Medicare HMO | Admitting: Certified Registered Nurse Anesthetist

## 2022-11-09 ENCOUNTER — Ambulatory Visit (HOSPITAL_COMMUNITY)
Admission: RE | Admit: 2022-11-09 | Discharge: 2022-11-09 | Disposition: A | Discharge status: Home / Self Care | Payer: Medicare HMO | Attending: Internal Medicine | Admitting: Internal Medicine

## 2022-11-09 ENCOUNTER — Encounter (HOSPITAL_COMMUNITY): Payer: Self-pay

## 2022-11-09 ENCOUNTER — Encounter (HOSPITAL_COMMUNITY)
Admission: RE | Disposition: A | Discharge status: Home / Self Care | Payer: Self-pay | Source: Home / Self Care | Attending: Internal Medicine

## 2022-11-09 DIAGNOSIS — F419 Anxiety disorder, unspecified: Secondary | ICD-10-CM | POA: Insufficient documentation

## 2022-11-09 DIAGNOSIS — K648 Other hemorrhoids: Secondary | ICD-10-CM

## 2022-11-09 DIAGNOSIS — D120 Benign neoplasm of cecum: Secondary | ICD-10-CM | POA: Insufficient documentation

## 2022-11-09 DIAGNOSIS — Q438 Other specified congenital malformations of intestine: Secondary | ICD-10-CM | POA: Diagnosis not present

## 2022-11-09 DIAGNOSIS — Z1211 Encounter for screening for malignant neoplasm of colon: Secondary | ICD-10-CM | POA: Diagnosis not present

## 2022-11-09 DIAGNOSIS — K649 Unspecified hemorrhoids: Secondary | ICD-10-CM | POA: Diagnosis not present

## 2022-11-09 DIAGNOSIS — J439 Emphysema, unspecified: Secondary | ICD-10-CM | POA: Diagnosis not present

## 2022-11-09 DIAGNOSIS — Z139 Encounter for screening, unspecified: Secondary | ICD-10-CM | POA: Diagnosis not present

## 2022-11-09 DIAGNOSIS — J449 Chronic obstructive pulmonary disease, unspecified: Secondary | ICD-10-CM | POA: Diagnosis not present

## 2022-11-09 DIAGNOSIS — D126 Benign neoplasm of colon, unspecified: Secondary | ICD-10-CM

## 2022-11-09 DIAGNOSIS — K5730 Diverticulosis of large intestine without perforation or abscess without bleeding: Secondary | ICD-10-CM | POA: Insufficient documentation

## 2022-11-09 DIAGNOSIS — Z87891 Personal history of nicotine dependence: Secondary | ICD-10-CM | POA: Insufficient documentation

## 2022-11-09 DIAGNOSIS — F413 Other mixed anxiety disorders: Secondary | ICD-10-CM

## 2022-11-09 DIAGNOSIS — K644 Residual hemorrhoidal skin tags: Secondary | ICD-10-CM

## 2022-11-09 DIAGNOSIS — Z9981 Dependence on supplemental oxygen: Secondary | ICD-10-CM | POA: Insufficient documentation

## 2022-11-09 DIAGNOSIS — K635 Polyp of colon: Secondary | ICD-10-CM | POA: Diagnosis not present

## 2022-11-09 HISTORY — PX: COLONOSCOPY WITH PROPOFOL: SHX5780

## 2022-11-09 HISTORY — PX: POLYPECTOMY: SHX5525

## 2022-11-09 SURGERY — COLONOSCOPY WITH PROPOFOL
Anesthesia: General

## 2022-11-09 MED ORDER — STERILE WATER FOR IRRIGATION IR SOLN
Status: DC | PRN
  Administered 2022-11-09: 60 mL

## 2022-11-09 MED ORDER — PROPOFOL 10 MG/ML IV BOLUS
INTRAVENOUS | Status: DC | PRN
  Administered 2022-11-09: 100 ug/kg/min via INTRAVENOUS
  Administered 2022-11-09: 50 mg via INTRAVENOUS

## 2022-11-09 MED ORDER — LACTATED RINGERS IV SOLN: INTRAVENOUS | Status: DC | PRN

## 2022-11-09 NOTE — H&P (Signed)
Primary Care Physician:  Benita Stabile, MD Primary Gastroenterologist:  Dr. Marletta Lor  Pre-Procedure History & Physical: HPI:  Donna Watkins is a 62 y.o. female is here for a colonoscopy for colon cancer screening purposes.  Past Medical History:  Diagnosis Date   Anxiety    panic attacks   Cancer (HCC)    COPD (chronic obstructive pulmonary disease) (HCC) 03/01/2012   Depression    Emphysema of lung (HCC)    GERD (gastroesophageal reflux disease)    History of radiation therapy    Left Lung- 12/09/20-12/19/20- Dr. Antony Blackbird   History of radiation therapy    Left lung- 05/19/22-05/26/22- Dr. Antony Blackbird   Hyperlipidemia    Mild scoliosis    Oxygen deficiency    Oxygen dependent     Past Surgical History:  Procedure Laterality Date   BREAST BIOPSY Left 06/25/2022   MM LT BREAST BX W LOC DEV 1ST LESION IMAGE BX SPEC STEREO GUIDE 06/25/2022 [facility]   BRONCHIAL BIOPSY  11/04/2020   Procedure: BRONCHIAL BIOPSIES;  Surgeon: Leslye Peer, MD;  Location: [facility] ENDOSCOPY;  Service: Pulmonary;;   BRONCHIAL BRUSHINGS  11/04/2020   Procedure: BRONCHIAL BRUSHINGS;  Surgeon: Leslye Peer, MD;  Location: [facility] ENDOSCOPY;  Service: Pulmonary;;   BRONCHIAL NEEDLE ASPIRATION BIOPSY  11/04/2020   Procedure: BRONCHIAL NEEDLE ASPIRATION BIOPSIES;  Surgeon: Leslye Peer, MD;  Location: [facility] ENDOSCOPY;  Service: Pulmonary;;   COLONOSCOPY N/A 09/09/2012   Procedure: COLONOSCOPY;  Surgeon: West Bali, MD;  Location: [facility];  Service: Endoscopy;  Laterality: N/A;  1:15-moved to 12:45 Melanie notified pt   FIDUCIAL MARKER PLACEMENT  11/04/2020   Procedure: FIDUCIAL MARKER PLACEMENT;  Surgeon: Leslye Peer, MD;  Location: [facility] ENDOSCOPY;  Service: Pulmonary;;   TUBAL LIGATION     VIDEO BRONCHOSCOPY WITH ENDOBRONCHIAL NAVIGATION N/A 11/04/2020   Procedure: ROBOTIC  VIDEO BRONCHOSCOPY WITH ENDOBRONCHIAL NAVIGATION;  Surgeon: Leslye Peer, MD;  Location: [facility] ENDOSCOPY;  Service:  Pulmonary;  Laterality: N/A;    Prior to Admission medications   Medication Sig Start Date End Date Taking? Authorizing Provider  albuterol (PROVENTIL) (2.5 MG/3ML) 0.083% nebulizer solution Take 2.5 mg by nebulization See admin instructions. 2.5 mg by nebulization five times daily 01/23/22  Yes [provider]  albuterol (VENTOLIN HFA) 108 (90 Base) MCG/ACT inhaler Inhale 2 puffs into the lungs every 4 (four) hours as needed for wheezing or shortness of breath. 11/27/21  Yes Johnson, Clanford L, MD  ALPRAZolam (XANAX) 1 MG tablet Take 1 tablet (1 mg total) by mouth 3 (three) times daily as needed for anxiety. Patient taking differently: Take 1 mg by mouth 5 (five) times daily. 11/27/21  Yes Johnson, Clanford L, MD  atorvastatin (LIPITOR) 20 MG tablet Take 1 tablet (20 mg total) by mouth daily. Restart on 04/03/22 04/03/22  Yes Tat, Onalee Hua, MD  feeding supplement (ENSURE ENLIVE / ENSURE PLUS) LIQD Take 237 mLs by mouth 2 (two) times daily between meals. 03/28/22  Yes Tat, Onalee Hua, MD  ipratropium-albuterol (DUONEB) 0.5-2.5 (3) MG/3ML SOLN Take 3 mLs by nebulization 3 (three) times daily. And as needed for SOB 11/27/21  Yes Johnson, Clanford L, MD  omeprazole (PRILOSEC) 20 MG capsule Take 20 mg by mouth daily.   Yes [provider]  OXYGEN Place 3 L into the nose continuous.   Yes [provider]  alendronate (FOSAMAX) 70 MG tablet Take 70 mg by mouth once a week. 09/10/22   [provider]  Multiple Vitamins-Minerals (CENTRUM SILVER PO) Take 1 tablet by mouth daily.    [provider]  mupirocin ointment (BACTROBAN) 2 % Apply 1 Application topically. Patient not taking: Reported on 09/24/2022 07/01/22   [provider]  nutrition supplement, JUVEN, (JUVEN) PACK Take 1 packet by mouth 2 (two) times daily between meals. 03/28/22   Catarina Hartshorn, MD    Allergies as of 10/05/2022   (No Known Allergies)    Family History  Problem Relation Age of Onset    Heart disease Father    COPD Mother    Anxiety disorder Mother    Arthritis Sister    Colon cancer Neg Hx     Social History   Socioeconomic History   Marital status: Divorced    Spouse name: Not on file   Number of children: Not on file   Years of education: Not on file   Highest education level: 10th grade  Occupational History   Occupation: Disabled  Tobacco Use   Smoking status: Former    Current packs/day: 0.00    Average packs/day: 2.5 packs/day for 30.0 years (75.0 ttl pk-yrs)    Types: Cigarettes    Start date: 06/11/1981    Quit date: 06/12/2011    Years since quitting: 11.4   Smokeless tobacco: Never  Vaping Use   Vaping status: Never Used  Substance and Sexual Activity   Alcohol use: No   Drug use: No   Sexual activity: Not Currently    Birth control/protection: Post-menopausal, Surgical    Comment: tubal ligation  Other Topics Concern   Not on file  Social History Narrative   Disability.    Lives with sister   Social Determinants of Health   Financial Resource Strain: Low Risk  (08/03/2022)   Overall Financial Resource Strain (CARDIA)    Difficulty of Paying Living Expenses: Not hard at all  Food Insecurity: No Food Insecurity (08/03/2022)   Hunger Vital Sign    Worried About Running Out of Food in the Last Year: Never true    Ran Out of Food in the Last Year: Never true  Recent Concern: Food Insecurity - Food Insecurity Present (06/23/2022)   Hunger Vital Sign    Worried About Running Out of Food in the Last Year: Sometimes true    Ran Out of Food in the Last Year: Never true  Transportation Needs: No Transportation Needs (08/03/2022)   PRAPARE - Administrator, Civil Service (Medical): No    Lack of Transportation (Non-Medical): No  Physical Activity: Insufficiently Active (08/03/2022)   Exercise Vital Sign    Days of Exercise per Week: 1 day    Minutes of Exercise per Session: 60 min  Stress: No Stress Concern Present (08/03/2022)    [employer] of Occupational Health - Occupational Stress Questionnaire    Feeling of Stress : Only a little  Social Connections: Socially Isolated (08/03/2022)   Social Connection and Isolation Panel [NHANES]    Frequency of Communication with Friends and Family: Three times a week    Frequency of Social Gatherings with Friends and Family: Twice a week    Attends Religious Services: Never    Database administrator or Organizations: No    Attends Banker Meetings: Never    Marital Status: Divorced  Catering manager Violence: Not At Risk (08/03/2022)   [facility]   Humiliation, Afraid, Rape, and Kick questionnaire    Fear of Current or Ex-Partner: No  Emotionally Abused: No    Physically Abused: No    Sexually Abused: No    Review of Systems: See HPI, otherwise negative ROS  Physical Exam: Vital signs in last 24 hours: Temp:  [98.3 F (36.8 C)] 98.3 F (36.8 C) (11/04 0749) Pulse Rate:  [78] 78 (11/04 0749) Resp:  [18] 18 (11/04 0749) BP: (125)/(72) 125/72 (11/04 0749) SpO2:  [99 %] 99 % (11/04 0749) Weight:  [45.4 kg] 45.4 kg (11/04 0749)   General:   Alert,  Well-developed, well-nourished, pleasant and cooperative in NAD Head:  Normocephalic and atraumatic. Eyes:  Sclera clear, no icterus.   Conjunctiva pink. Ears:  Normal auditory acuity. Nose:  No deformity, discharge,  or lesions. Msk:  Symmetrical without gross deformities. Normal posture. Extremities:  Without clubbing or edema. Neurologic:  Alert and  oriented x4;  grossly normal neurologically. Skin:  Intact without significant lesions or rashes. Psych:  Alert and cooperative. Normal mood and affect.  Impression/Plan: Donna Watkins is here for a colonoscopy to be performed for colon cancer screening purposes.  The risks of the procedure including infection, bleed, or perforation as well as benefits, limitations, alternatives and imponderables have been reviewed with the  patient. Questions have been answered. All parties agreeable.

## 2022-11-09 NOTE — Transfer of Care (Signed)
Immediate Anesthesia Transfer of Care Note  Patient: Emeterio Reeve  Procedure(s) Performed: COLONOSCOPY WITH PROPOFOL POLYPECTOMY  Patient Location: Short Stay  Anesthesia Type:General  Level of Consciousness: drowsy  Airway & Oxygen Therapy: Patient Spontanous Breathing and Patient connected to nasal cannula oxygen  Post-op Assessment: Report given to RN and Post -op Vital signs reviewed and stable  Post vital signs: Reviewed and stable  Last Vitals:  Vitals Value Taken Time  BP 87/51 11/09/22 0900  Temp 36.4 C 11/09/22 0900  Pulse 88 11/09/22 0900  Resp 18 11/09/22 0900  SpO2 99 % 11/09/22 0900    Last Pain:  Vitals:   11/09/22 0900  TempSrc: Axillary  PainSc: 0-No pain         Complications: No notable events documented. Pt on home O2 requirement of 3L [place].

## 2022-11-09 NOTE — Op Note (Signed)
[facility] Patient Name: Donna Watkins Procedure Date: 11/09/2022 8:25 AM MRN: [id number] Date of Birth: 11/11/1960 Attending MD: Hennie Duos. Marletta Lor , [place], [id number] CSN: [id number] Age: 63 Admit Type: Outpatient Procedure:                Colonoscopy Indications:              Screening for colorectal malignant neoplasm Providers:                Hennie Duos. Marletta Lor, DO, Francoise Ceo RN, RN, Nena Polio, RN, Dyann Ruddle Referring MD:              Medicines:                See the Anesthesia note for documentation of the                            administered medications Complications:            No immediate complications. Estimated Blood Loss:     Estimated blood loss was minimal. Procedure:                Pre-Anesthesia Assessment:                           - The anesthesia plan was to use monitored                            anesthesia care (MAC).                           After obtaining informed consent, the colonoscope                            was passed under direct vision. Throughout the                            procedure, the patient's blood pressure, pulse, and                            oxygen saturations were monitored continuously. The                            PCF-HQ190L ([id number]) scope was introduced through                            the anus and advanced to the the cecum, identified                            by appendiceal orifice and ileocecal valve. The                            colonoscopy was performed without difficulty. The                            patient tolerated  the procedure well. The quality                            of the bowel preparation was evaluated using the                            BBPS [facility] Bowel Preparation Scale) with scores                            of: Right Colon = 3, Transverse Colon = 3 and Left                            Colon = 3 (entire mucosa seen well with no residual                             staining, small fragments of stool or opaque                            liquid). The total BBPS score equals 9. Scope In: 8:40:41 AM Scope Out: 8:56:38 AM Scope Withdrawal Time: 0 hours 10 minutes 34 seconds  Total Procedure Duration: 0 hours 15 minutes 57 seconds  Findings:      Hemorrhoids were found on perianal exam.      Multiple medium-mouthed and small-mouthed diverticula were found in the       sigmoid colon and descending colon.      A 5 mm polyp was found in the cecum. The polyp was sessile. The polyp       was removed with a cold snare. Resection and retrieval were complete.      The colon (entire examined portion) was moderately redundant.      Non-bleeding internal hemorrhoids were found.      The exam was otherwise without abnormality. Impression:               - Hemorrhoids found on perianal exam.                           - Diverticulosis in the sigmoid colon and in the                            descending colon.                           - One 5 mm polyp in the cecum, removed with a cold                            snare. Resected and retrieved.                           - Redundant colon.                           - The examination was otherwise normal. Moderate Sedation:      Per Anesthesia Care Recommendation:           - Patient has a contact number available for  emergencies. The signs and symptoms of potential                            delayed complications were discussed with the                            patient. Return to normal activities tomorrow.                            Written discharge instructions were provided to the                            patient.                           - Resume previous diet.                           - Continue present medications.                           - Await pathology results.                           - Repeat colonoscopy in 7 years for surveillance.                           -  Return to GI clinic PRN. Procedure Code(s):        --- Professional ---                           [phone], Colonoscopy, flexible; with removal of                            tumor(s), polyp(s), or other lesion(s) by snare                            technique Diagnosis Code(s):        --- Professional ---                           Z12.11, Encounter for screening for malignant                            neoplasm of colon                           K64.9, Unspecified hemorrhoids                           D12.0, Benign neoplasm of cecum                           K57.30, Diverticulosis of large intestine without                            perforation or abscess without bleeding  Q43.8, Other specified congenital malformations of                            intestine CPT copyright 2022 American Medical Association. All rights reserved. The codes documented in this report are preliminary and upon coder review may  be revised to meet current compliance requirements. Hennie Duos. Marletta Lor, DO Hennie Duos. Marletta Lor, DO 11/09/2022 8:59:23 AM This report has been signed electronically. Number of Addenda: 0

## 2022-11-09 NOTE — Discharge Instructions (Signed)
  Colonoscopy Discharge Instructions  Read the instructions outlined below and refer to this sheet in the next few weeks. These discharge instructions provide you with general information on caring for yourself after you leave the hospital. Your doctor may also give you specific instructions. While your treatment has been planned according to the most current medical practices available, unavoidable complications occasionally occur.   ACTIVITY You may resume your regular activity, but move at a slower pace for the next 24 hours.  Take frequent rest periods for the next 24 hours.  Walking will help get rid of the air and reduce the bloated feeling in your belly (abdomen).  No driving for 24 hours (because of the medicine (anesthesia) used during the test).   Do not sign any important legal documents or operate any machinery for 24 hours (because of the anesthesia used during the test).  NUTRITION Drink plenty of fluids.  You may resume your normal diet as instructed by your doctor.  Begin with a light meal and progress to your normal diet. Heavy or fried foods are harder to digest and may make you feel sick to your stomach (nauseated).  Avoid alcoholic beverages for 24 hours or as instructed.  MEDICATIONS You may resume your normal medications unless your doctor tells you otherwise.  WHAT YOU CAN EXPECT TODAY Some feelings of bloating in the abdomen.  Passage of more gas than usual.  Spotting of blood in your stool or on the toilet paper.  IF YOU HAD POLYPS REMOVED DURING THE COLONOSCOPY: No aspirin products for 7 days or as instructed.  No alcohol for 7 days or as instructed.  Eat a soft diet for the next 24 hours.  FINDING OUT THE RESULTS OF YOUR TEST Not all test results are available during your visit. If your test results are not back during the visit, make an appointment with your caregiver to find out the results. Do not assume everything is normal if you have not heard from your  caregiver or the medical facility. It is important for you to follow up on all of your test results.  SEEK IMMEDIATE MEDICAL ATTENTION IF: You have more than a spotting of blood in your stool.  Your belly is swollen (abdominal distention).  You are nauseated or vomiting.  You have a temperature over 101.  You have abdominal pain or discomfort that is severe or gets worse throughout the day.   Your colonoscopy revealed 1 polyp(s) which I removed successfully. Await pathology results, my office will contact you. I recommend repeating colonoscopy in 7 years for surveillance purposes.   You also have diverticulosis and internal hemorrhoids. I would recommend increasing fiber in your diet or adding OTC Benefiber/Metamucil. Be sure to drink at least 4 to 6 glasses of water daily. Follow-up with GI as needed.   I hope you have a great rest of your week!  Charles K. Carver, D.O. Gastroenterology and Hepatology [facility]  

## 2022-11-09 NOTE — Anesthesia Preprocedure Evaluation (Signed)
Anesthesia Evaluation  Patient identified by MRN, date of birth, ID band Patient awake    Reviewed: Allergy & Precautions, H&P , NPO status , Patient's Chart, lab work & pertinent test results, reviewed documented beta blocker date and time   Airway Mallampati: II  TM Distance: >3 FB Neck ROM: full    Dental no notable dental hx.    Pulmonary neg pulmonary ROS, COPD,  oxygen dependent, former smoker   Pulmonary exam normal breath sounds clear to auscultation       Cardiovascular Exercise Tolerance: Good negative cardio ROS  Rhythm:regular Rate:Normal     Neuro/Psych  PSYCHIATRIC DISORDERS Anxiety Depression    negative neurological ROS  negative psych ROS   GI/Hepatic negative GI ROS, Neg liver ROS,GERD  ,,  Endo/Other  negative endocrine ROS    Renal/GU negative Renal ROS  negative genitourinary   Musculoskeletal   Abdominal   Peds  Hematology negative hematology ROS (+) Blood dyscrasia, anemia   Anesthesia Other Findings   Reproductive/Obstetrics negative OB ROS                             Anesthesia Physical Anesthesia Plan  ASA: 3  Anesthesia Plan: General   Post-op Pain Management:    Induction:   PONV Risk Score and Plan: Propofol infusion  Airway Management Planned:   Additional Equipment:   Intra-op Plan:   Post-operative Plan:   Informed Consent: I have reviewed the patients History and Physical, chart, labs and discussed the procedure including the risks, benefits and alternatives for the proposed anesthesia with the patient or authorized representative who has indicated his/her understanding and acceptance.     Dental Advisory Given  Plan Discussed with: CRNA  Anesthesia Plan Comments:        Anesthesia Quick Evaluation

## 2022-11-10 LAB — SURGICAL PATHOLOGY

## 2022-11-10 NOTE — Anesthesia Postprocedure Evaluation (Signed)
Anesthesia Post Note  Patient: Donna Watkins  Procedure(s) Performed: COLONOSCOPY WITH PROPOFOL POLYPECTOMY  Patient location during evaluation: Phase II Anesthesia Type: General Level of consciousness: awake Pain management: pain level controlled Vital Signs Assessment: post-procedure vital signs reviewed and stable Respiratory status: spontaneous breathing and respiratory function stable Cardiovascular status: blood pressure returned to baseline and stable Postop Assessment: no headache and no apparent nausea or vomiting Anesthetic complications: no Comments: Late entry   No notable events documented.   Last Vitals:  Vitals:   11/09/22 0900 11/09/22 0904  BP: (!) 87/51 (!) 102/54  Pulse: 88 90  Resp: 18 18  Temp: 36.4 C   SpO2: 99% 100%    Last Pain:  Vitals:   11/09/22 0904  TempSrc:   PainSc: 0-No pain                 Windell Norfolk

## 2022-11-11 DIAGNOSIS — Z87891 Personal history of nicotine dependence: Secondary | ICD-10-CM | POA: Diagnosis not present

## 2022-11-11 DIAGNOSIS — L89159 Pressure ulcer of sacral region, unspecified stage: Secondary | ICD-10-CM | POA: Diagnosis not present

## 2022-11-11 DIAGNOSIS — Z923 Personal history of irradiation: Secondary | ICD-10-CM | POA: Diagnosis not present

## 2022-11-11 DIAGNOSIS — J439 Emphysema, unspecified: Secondary | ICD-10-CM | POA: Diagnosis not present

## 2022-11-11 DIAGNOSIS — Z79899 Other long term (current) drug therapy: Secondary | ICD-10-CM | POA: Diagnosis not present

## 2022-11-11 DIAGNOSIS — F419 Anxiety disorder, unspecified: Secondary | ICD-10-CM | POA: Diagnosis not present

## 2022-11-11 DIAGNOSIS — L89153 Pressure ulcer of sacral region, stage 3: Secondary | ICD-10-CM | POA: Diagnosis not present

## 2022-11-11 DIAGNOSIS — Z85118 Personal history of other malignant neoplasm of bronchus and lung: Secondary | ICD-10-CM | POA: Diagnosis not present

## 2022-11-11 DIAGNOSIS — E785 Hyperlipidemia, unspecified: Secondary | ICD-10-CM | POA: Diagnosis not present

## 2022-11-12 ENCOUNTER — Encounter (HOSPITAL_COMMUNITY): Payer: Self-pay | Admitting: Internal Medicine

## 2022-11-23 DIAGNOSIS — L308 Other specified dermatitis: Secondary | ICD-10-CM | POA: Diagnosis not present

## 2022-11-23 DIAGNOSIS — K130 Diseases of lips: Secondary | ICD-10-CM | POA: Diagnosis not present

## 2022-11-27 ENCOUNTER — Ambulatory Visit (INDEPENDENT_AMBULATORY_CARE_PROVIDER_SITE_OTHER): Payer: Medicare HMO | Admitting: Internal Medicine

## 2022-11-27 ENCOUNTER — Encounter: Payer: Self-pay | Admitting: Internal Medicine

## 2022-11-27 VITALS — BP 120/80 | HR 108 | Ht 63.0 in | Wt 102.6 lb

## 2022-11-27 DIAGNOSIS — J449 Chronic obstructive pulmonary disease, unspecified: Secondary | ICD-10-CM | POA: Diagnosis not present

## 2022-11-27 DIAGNOSIS — J9612 Chronic respiratory failure with hypercapnia: Secondary | ICD-10-CM | POA: Diagnosis not present

## 2022-11-27 DIAGNOSIS — K828 Other specified diseases of gallbladder: Secondary | ICD-10-CM

## 2022-11-27 DIAGNOSIS — J9611 Chronic respiratory failure with hypoxia: Secondary | ICD-10-CM | POA: Diagnosis not present

## 2022-11-27 NOTE — Patient Instructions (Addendum)
Call your NIV company to ask if you can put the 02 tubing from your  concentrator into your NIV   Make sure you check your oxygen saturation  AT  your highest level of activity (not after you stop)   to be sure it stays over 90% and adjust  02 flow upward to maintain this level if needed but remember to turn it back to previous settings when you stop (to conserve your supply).   Please schedule a follow up office visit in 6 weeks, call sooner if needed

## 2022-11-27 NOTE — Progress Notes (Unsigned)
Subjective:   Patient ID: Donna Watkins, female    DOB: 1960/08/15     MRN: [id number]    Brief patient profile:  54 yowf  MM/quit smoking in 2013 with severe copd at baseline last seen in pulmonary clinic  by [facility] 11/2013  GOLD IV criteria with PFTs 05/2012  with Fev1 only 26%, diffusion at 52%    History of Present Illness  07/31/2016 1st office visit/ Donna Watkins   GOLD IV copd/ 02 2lpm 24/7  Chief Complaint  Patient presents with   Pulmonary Consult    Pt is self referred for COPD. Pt c/o DOE, throat clearing. Pt denies CP/tightness anf recentl f/c/s. Pt states she rarely uses albuterol hfa.   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  The patient states that she had been in her usual state of health until 03/25/2022 when she was working with home health physical therapy.  Apparently, home health PT noted the patient to be hypoxic with ambulation into the 85% on her usual 3 L.  The patient apparently was tachycardic into the 100s.  As result, EMS was activated.  Upon EMS arrival, she was noted to have oxygen saturation in the high 80s.  Show her oxygen was increased to 4 L. In the ED, the patient was afebrile and hemodynamically stable.  She was  placed on BiPAP.  WBC 11.6, hemoglobin 12.9, platelets 141,000.  Sodium 142, potassium 3.7, bicarbonate >45.  Serum creatinine 0.44.  AST 211, ALT 670, alkaline phosphatase 97, total bilirubin 1.0.  CTA chest was negative for PE.  Showed a 1.9 x 1.4 cmright lower lobe nodule.  The patient was given DuoNebs and IV Solu-Medrol.  She was admitted for acute on chronic respiratory failure.   Assessment and Plan: Acute on chronic respiratory failure with hypoxia and hypercarbia -Secondary to COPD exacerbation -03/25/2022 VBG 7.32/>123/<31/73 -Weaned off continuous BiPAP back to baseline 3 L -she still used BiPAP at night -Pt already has NIV machine at home that she is compliant with   COPD exacerbation -COVID-19/RSV/Flu negative -continue  brovana -continued pulmicort -continued yupleri -03/25/2022 CTA chest negative for PE -IV solumedrol>>d/c home with prednisone taper   Transaminasemia -Patient is hepatitis A immune -Hepatitis B and C--neg -right upper quadrant ultrasound---Diffuse gallbladder wall calcifications and shadowing consistent with the history of porcelain gallbladder. Evaluation for stones is limited. 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 -general surgery consult -discussed with general surgery, Donna Watkins indication presently for operative intervention   Unstageable sacral decubitus ulcer -Wound care consult -There is some mild cellulitis surrounding the wound -appreciate surgery>>debrided -received vanco during hospitalization D/c home with doxy x 4 more days   Anxiety -Continue home dose alprazolam -PDMP reviewed -Patient received Xanax 1 mg, #150 monthly, last refill  02/19/2022   Hypomagnesemia/hypophosphatemia -Repleted   Mixed hyperlipidemia -Holding statin secondary to elevated LFTs   GERD -Continue PPI   Thrombocytopenia -Appears to be chronic -monitor for signs of bleeding   Moderate malnutrition -continue supplements     08/07/2022  f/u ov/[place] office/Donna Watkins re: COPD GOLD  4/ 02 dep maint on duoneb bid  and NIV noct  Chief Complaint  Patient presents with   COPD    Gold IV   Dyspnea:  shops but uses scooter at [employer]/ pushes cart on 2.5 lpm at [employer] Cough: none  Sleeping: on NIV per [employer]  SABA use: rarely hfa  02: 3lpm at house  Rec No change in your medications Make sure you check your oxygen saturation  AT  your highest level of activity (not after you stop)   to be sure it stays over low  90s% Please schedule a follow up visit in 3 months but call sooner if needed     11/27/2022  f/u ov/[place] office/Donna Watkins re: GOLD 4 COPD /02 dep  maint on duoneb and NIV   Chief Complaint  Patient presents with   Follow-up    Breathing is at baseline for her today. No new co's.   Dyspnea:  uses scooter unless just at store for a few items, using [facility] parking  Cough: none Sleeping: NIV up to 8 h s resp cc  SABA use: neb 3-4 x per day  02: 3lpm wears NP at bed and NIV mask  - 3 liter conc at home but 2lpm cont tank   Lung cancer screening: done 10/01/22    No obvious day to day or daytime variability or assoc excess/ purulent sputum or mucus plugs or hemoptysis or cp or chest tightness, subjective wheeze or overt sinus or hb symptoms.    Also denies any obvious fluctuation of symptoms with weather or environmental changes or other aggravating or alleviating factors except as outlined above   No unusual exposure hx or h/o childhood pna/ asthma or knowledge of premature birth.  Current Allergies, Complete Past Medical History, Past Surgical History, Family History, and Social History were reviewed in [employer] record.  ROS  The following are not active complaints unless bolded Hoarseness, sore throat, dysphagia, dental problems, itching, sneezing,  nasal congestion or discharge of excess mucus or purulent secretions, ear ache,   fever, chills, sweats, unintended wt loss or wt gain, classically pleuritic or exertional cp,  orthopnea pnd or arm/hand swelling  or leg swelling, presyncope, palpitations, abdominal pain, anorexia, nausea, vomiting, diarrhea  or change in bowel habits or change in bladder habits, change in stools or change in urine, dysuria, hematuria,  rash, arthralgias, visual complaints, headache, numbness, weakness or ataxia or problems with walking or coordination,  change in mood or  memory.        Current Meds  Medication Sig   albuterol (VENTOLIN HFA) 108 (90 Base) MCG/ACT inhaler Inhale 2 puffs into the lungs every 4 (four) hours as needed for wheezing or shortness of breath.   alendronate (FOSAMAX) 70  MG tablet Take 70 mg by mouth once a week.   ALPRAZolam (XANAX) 1 MG tablet Take 1 tablet (1 mg total) by mouth 3 (three) times daily as needed for anxiety. (Patient taking differently: Take 1 mg by mouth 5 (five) times daily.)   atorvastatin (LIPITOR) 20 MG tablet Take 1 tablet (20 mg total) by mouth daily. Restart on 04/03/22   feeding supplement (ENSURE ENLIVE / ENSURE PLUS) LIQD Take 237 mLs by mouth 2 (two) times daily between meals.   ipratropium-albuterol (DUONEB) 0.5-2.5 (3) MG/3ML SOLN Take 3 mLs by nebulization 3 (three) times daily. And as needed for SOB   Multiple Vitamins-Minerals (CENTRUM SILVER PO) Take 1 tablet by mouth daily.   mupirocin ointment (BACTROBAN) 2 % Apply 1 Application topically.   nutrition supplement, JUVEN, (JUVEN) PACK Take 1 packet by mouth 2 (two) times daily between meals.   omeprazole (PRILOSEC) 20 MG capsule Take 20 mg by mouth daily.   OXYGEN Place 3 L into the nose continuous.               Objective:   Physical  Exam  Wts  11/27/2022        102  08/07/2022           100  05/12/2022            89  08/18/2021         112  02/19/2021         114  08/14/2020         120  02/27/2020         116 08/28/2019         107  05/26/2019       106  07/04/2018        129 01/03/2018      126  07/02/2017        128 12/31/2016      136   07/31/16 145 lb 9.6 oz (66 kg)  01/22/14 180 lb (81.6 kg)  05/23/13 180 lb 12.8 oz (82 kg)      01/03/18 126 lb (57.2 kg)  07/02/17 128 lb 9.6 oz (58.3 kg)  02/04/17 130 lb (59 kg)     Vital signs reviewed  11/27/2022  - Note at rest 02 sats  94% on 2lpm cont    General appearance:    elderly > stated age frail amb wf nad   HEENT :  Oropharynx  clear  Nasal turbinates nl    NECK :  without JVD/Nodes/TM/ nl carotid upstrokes bilaterally   LUNGS: no acc muscle use,  Mod barrel  contour chest wall with bilateral  Distant bs s audible wheeze and  without cough on insp or exp maneuvers and mod  Hyperresonant  to  percussion bilaterally     CV:  RRR  no s3 or murmur or increase in P2, and no edema   ABD:  soft and nontender    MS:   Ext warm without deformities or   obvious joint restrictions , calf tenderness, cyanosis - Mod clubbing   SKIN: warm and dry without lesions    NEURO:  alert, approp, nl sensorium with  no motor or cerebellar deficits apparent.              I personally reviewed images and agree with radiology impression as follows:   Chest CT 10/01/22    w/o contrast 1. Marked interval decrease in size of the right lower lobe pulmonary nodule of concern on  the previous study. 2. Stable scarring in the retro hilar left lung incorporates a localization clip. 3. New T5 compression deformity. Vertebral body peers diffusely sclerotic on sagittal imaging, but no underlying soft tissue lesion evident to suggest pathologic fracture. Attention on follow-up recommended. 4. Aortic Atherosclerosis (ICD10-I70.0) and Emphysema (ICD10-J43.9).               Assessment & Plan:

## 2022-11-28 NOTE — Assessment & Plan Note (Signed)
Quit smoking 2013/MM - PFT's  04/27/12  FEV1 0.70 (26 %) ratio 35  p no % improvement from saba p ? prior to study with DLCO  53 % corrects to 69 % for alv volume with classic curvature   - Alpha one AT screening   07/31/16 >   MM - PFT's  12/31/2016  FEV1 0.74 (28 % ) ratio 38  p no % improvement from saba p saba 4h   prior to study with DLCO  37/38c % corrects to 50 % for alv volume   - 07/02/2017   try bevespi: did not use correctly  - 01/03/2018    try bevespi again > could not afford and could not tell really helped vs duoneb - 05/26/2019  After extensive coaching inhaler device,  effectiveness =    90% with smi > try stiolto> could not afford it on her plan >   - as of 08/14/2020 maint on duoneb qid  - NIV dep since admit 03/25/22 maint on duoneb   Nothing else to offer at this point - though she has very severe dz, this is mostly emphysema and not AB and may reach an equilibrium with elevated pC02 indefinitely (unless over does it with NIV use which interferes with the nl hypercarbia that accompanies end stage dz and is many time better tolerated than NIV)  No changed in rx needed/f/u q 6 m approp with hospice consideration if worsens

## 2022-11-28 NOTE — Assessment & Plan Note (Signed)
07/31/2016  Patient Saturations on Room Air at Rest = 88% and while Ambulating = 83% - HC03  07/31/2016  = 38  - 07/31/2016   Walked 3lpm  2 laps @ 185 ft each stopped due to  Sob at moderate pace with no desat  - RA  = 85% 01/03/2018 corrected to 92 on 2lpm continuous  -  05/26/2019   Walked 2lpm cont x  approx   200 ft  @ moderate pace  stopped due to  Sob with sats still 91%   -  08/28/2019   Walked 2.5 lpm  approx   400 ft  @ moderate pace  stopped due to  Sob with sats still   90% - 02 sats at rst 85% RA 02/27/2020  - NIV as of 02/10/22 per [employer] - HCO3  05/12/2022    =  33   - 08/07/2022   Walked on RA  x  3  lap(s) =  approx 450  ft  @ slow pace, stopped due to end of study  with lowest 02 sats 89%   - 11/27/2022   Walked on 2.5 lpm cont   x  2  lap(s) =  approx 300  ft  @ slow pace, stopped due to sob with lowest 02 sats 91% but finished at 93%    Each maintenance medication was reviewed in detail including emphasizing most importantly the difference between maintenance and prns and under what circumstances the prns are to be triggered using an action plan format where appropriate.  Total time for H and P, chart review, counseling, reviewing neb/02 //pulse ox  device(s) , directly observing portions of ambulatory 02 saturation study/ and generating customized AVS unique to this office visit / same day charting = 26 min

## 2022-12-09 DIAGNOSIS — J439 Emphysema, unspecified: Secondary | ICD-10-CM | POA: Diagnosis not present

## 2022-12-09 DIAGNOSIS — L89153 Pressure ulcer of sacral region, stage 3: Secondary | ICD-10-CM | POA: Diagnosis not present

## 2022-12-09 DIAGNOSIS — Z87891 Personal history of nicotine dependence: Secondary | ICD-10-CM | POA: Diagnosis not present

## 2022-12-09 DIAGNOSIS — Z79899 Other long term (current) drug therapy: Secondary | ICD-10-CM | POA: Diagnosis not present

## 2022-12-09 DIAGNOSIS — E785 Hyperlipidemia, unspecified: Secondary | ICD-10-CM | POA: Diagnosis not present

## 2022-12-29 ENCOUNTER — Other Ambulatory Visit (HOSPITAL_COMMUNITY): Payer: Medicare HMO

## 2022-12-29 ENCOUNTER — Encounter (HOSPITAL_COMMUNITY): Payer: Medicare HMO

## 2023-01-05 ENCOUNTER — Ambulatory Visit (HOSPITAL_COMMUNITY): Payer: Medicare HMO

## 2023-01-05 ENCOUNTER — Encounter (HOSPITAL_COMMUNITY): Payer: Self-pay

## 2023-01-05 ENCOUNTER — Ambulatory Visit (HOSPITAL_COMMUNITY)
Admission: RE | Admit: 2023-01-05 | Discharge: 2023-01-05 | Disposition: A | Discharge status: Home / Self Care | Payer: Medicare HMO | Source: Ambulatory Visit | Attending: Internal Medicine | Admitting: Internal Medicine

## 2023-01-05 DIAGNOSIS — R928 Other abnormal and inconclusive findings on diagnostic imaging of breast: Secondary | ICD-10-CM | POA: Insufficient documentation

## 2023-01-05 DIAGNOSIS — R92322 Mammographic fibroglandular density, left breast: Secondary | ICD-10-CM | POA: Diagnosis not present

## 2023-01-13 DIAGNOSIS — J439 Emphysema, unspecified: Secondary | ICD-10-CM | POA: Diagnosis not present

## 2023-01-13 DIAGNOSIS — Z79899 Other long term (current) drug therapy: Secondary | ICD-10-CM | POA: Diagnosis not present

## 2023-01-13 DIAGNOSIS — E785 Hyperlipidemia, unspecified: Secondary | ICD-10-CM | POA: Diagnosis not present

## 2023-01-13 DIAGNOSIS — L89153 Pressure ulcer of sacral region, stage 3: Secondary | ICD-10-CM | POA: Diagnosis not present

## 2023-01-13 DIAGNOSIS — Z87891 Personal history of nicotine dependence: Secondary | ICD-10-CM | POA: Diagnosis not present

## 2023-01-15 DIAGNOSIS — M810 Age-related osteoporosis without current pathological fracture: Secondary | ICD-10-CM | POA: Diagnosis not present

## 2023-01-15 DIAGNOSIS — Z87891 Personal history of nicotine dependence: Secondary | ICD-10-CM | POA: Diagnosis not present

## 2023-01-15 DIAGNOSIS — J449 Chronic obstructive pulmonary disease, unspecified: Secondary | ICD-10-CM | POA: Diagnosis not present

## 2023-01-15 DIAGNOSIS — F411 Generalized anxiety disorder: Secondary | ICD-10-CM | POA: Diagnosis not present

## 2023-01-15 DIAGNOSIS — Z7951 Long term (current) use of inhaled steroids: Secondary | ICD-10-CM | POA: Diagnosis not present

## 2023-03-03 DIAGNOSIS — M419 Scoliosis, unspecified: Secondary | ICD-10-CM | POA: Diagnosis not present

## 2023-03-03 DIAGNOSIS — Z79899 Other long term (current) drug therapy: Secondary | ICD-10-CM | POA: Diagnosis not present

## 2023-03-03 DIAGNOSIS — L89153 Pressure ulcer of sacral region, stage 3: Secondary | ICD-10-CM | POA: Diagnosis not present

## 2023-03-03 DIAGNOSIS — E785 Hyperlipidemia, unspecified: Secondary | ICD-10-CM | POA: Diagnosis not present

## 2023-03-03 DIAGNOSIS — F419 Anxiety disorder, unspecified: Secondary | ICD-10-CM | POA: Diagnosis not present

## 2023-03-03 DIAGNOSIS — Z9981 Dependence on supplemental oxygen: Secondary | ICD-10-CM | POA: Diagnosis not present

## 2023-03-03 DIAGNOSIS — L89159 Pressure ulcer of sacral region, unspecified stage: Secondary | ICD-10-CM | POA: Diagnosis not present

## 2023-03-03 DIAGNOSIS — J439 Emphysema, unspecified: Secondary | ICD-10-CM | POA: Diagnosis not present

## 2023-03-03 DIAGNOSIS — Z87891 Personal history of nicotine dependence: Secondary | ICD-10-CM | POA: Diagnosis not present

## 2023-03-03 DIAGNOSIS — C3490 Malignant neoplasm of unspecified part of unspecified bronchus or lung: Secondary | ICD-10-CM | POA: Diagnosis not present

## 2023-03-10 IMAGING — CT CT CHEST W/ CM
2 of 3 series · 15 of 36 positions shown, 18 images · IV contrast (Omnipaque or Isovue)
Comparison: MR thoracic spine, 07/25/2020

CLINICAL DATA: Abnormal MR thoracic spine, possible pulmonary
nodule

EXAM:
CT CHEST WITH CONTRAST
TECHNIQUE: Multidetector CT imaging of the chest was performed during
intravenous contrast administration.
CONTRAST:  75mL OMNIPAQUE IOHEXOL 300 MG/ML  SOLN

[Series 2: routine chest with · axial · 0.60mm/px · z∈[+1199,+1471]mm · 12 of 160 slices shown, 15 images]
[im 12/160  mediastinal]
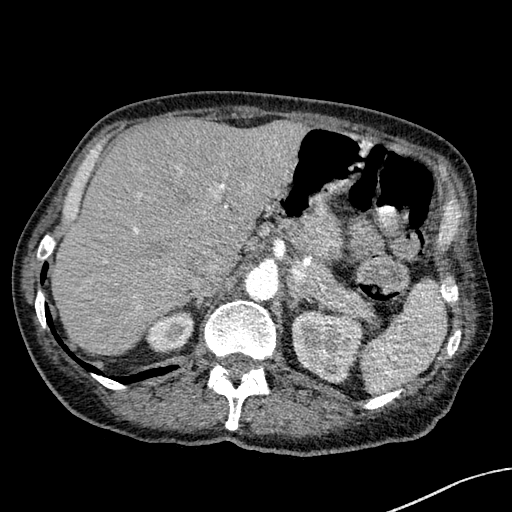
[im 12/160  lung]
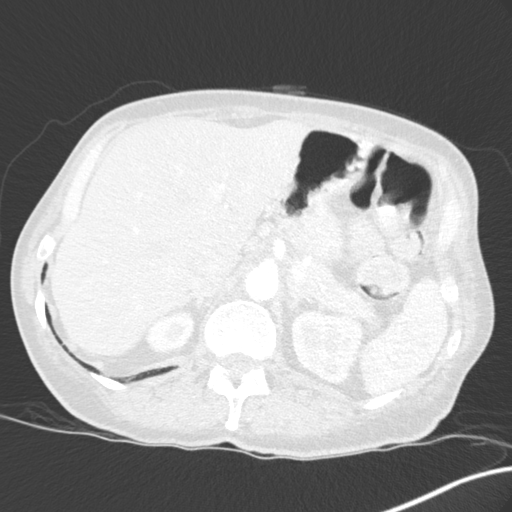
[im 24/160  lung]
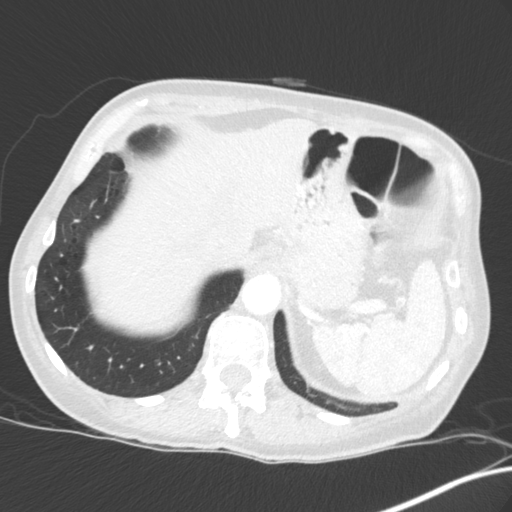
[im 36/160  lung]
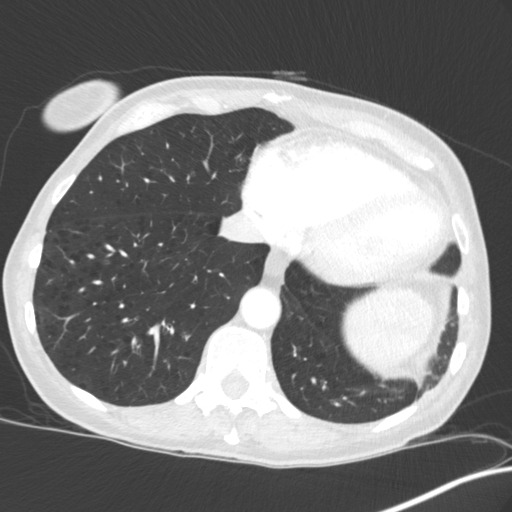
[im 48/160  lung]
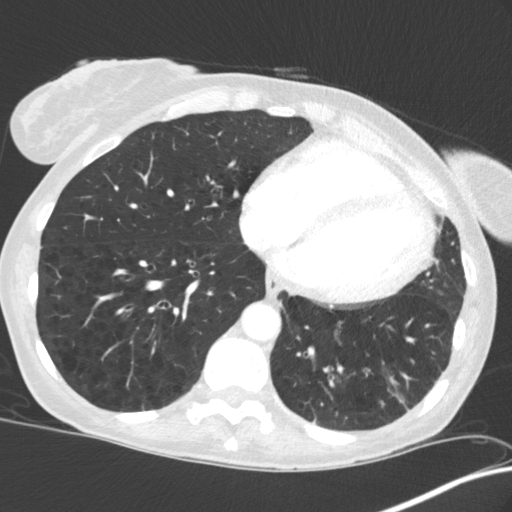
[im 59/160  mediastinal]
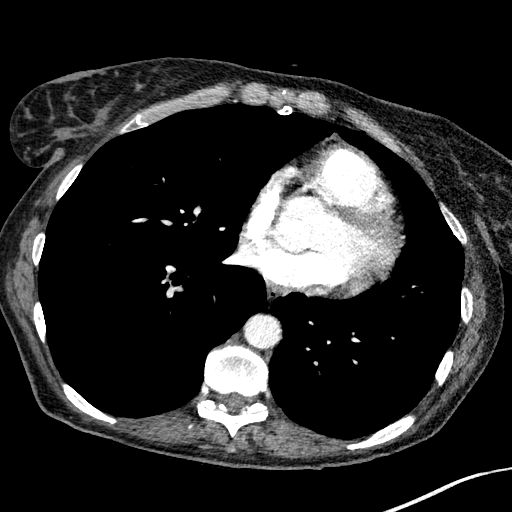
[im 59/160  lung]
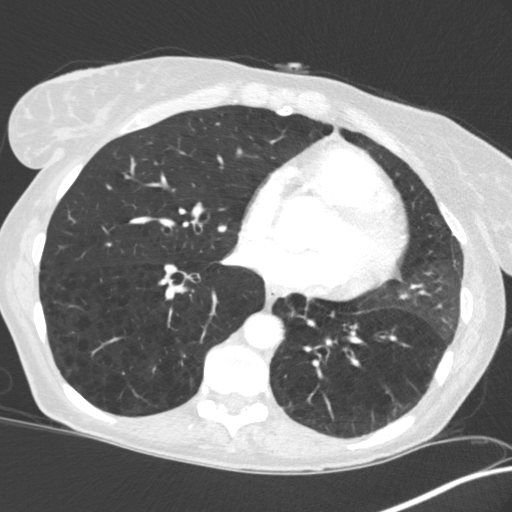
[im 71/160  lung]
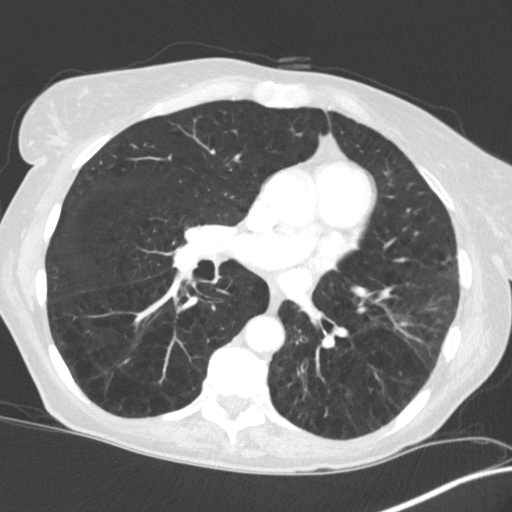
[im 89/160  lung]
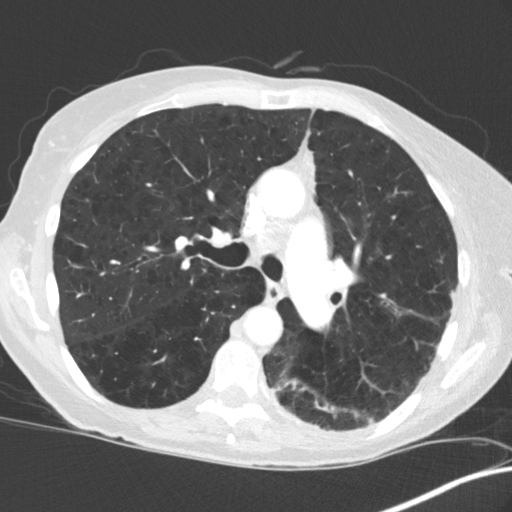
[im 101/160  lung]
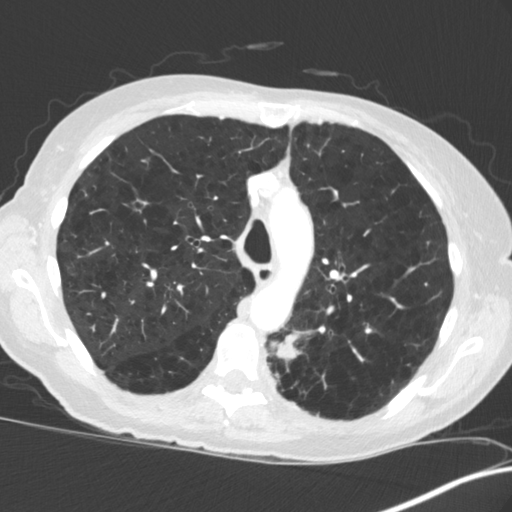
[im 112/160  mediastinal]
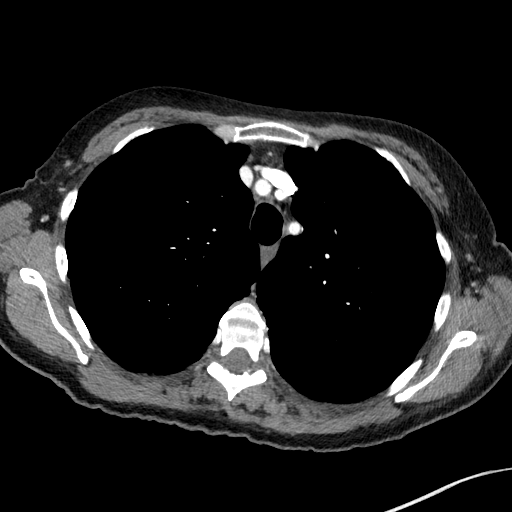
[im 112/160  lung]
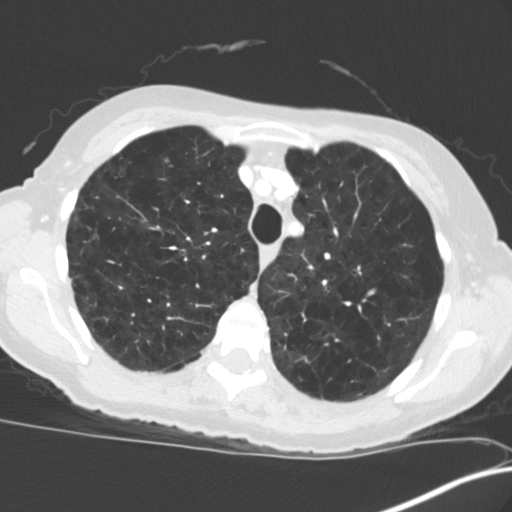
[im 124/160  lung]
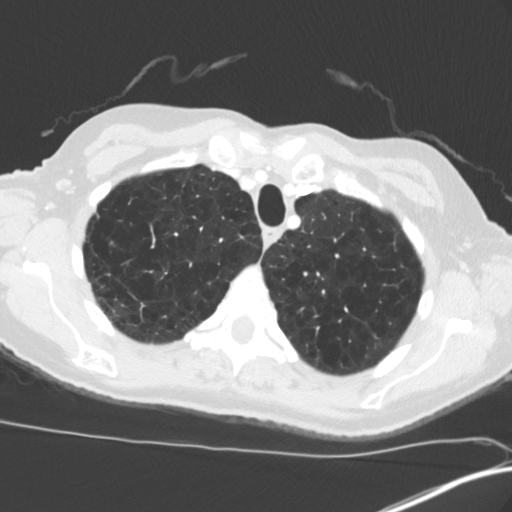
[im 136/160  lung]
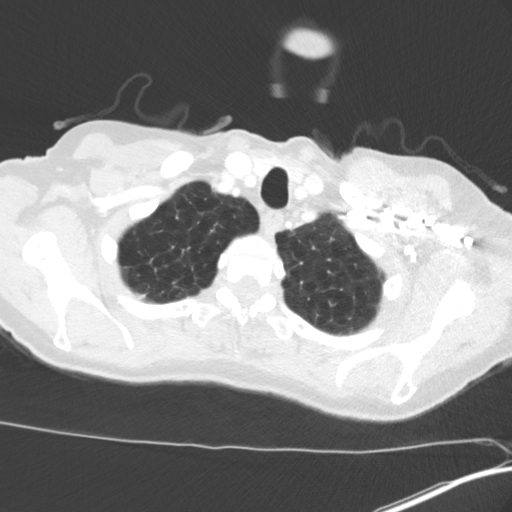
[im 148/160  lung]
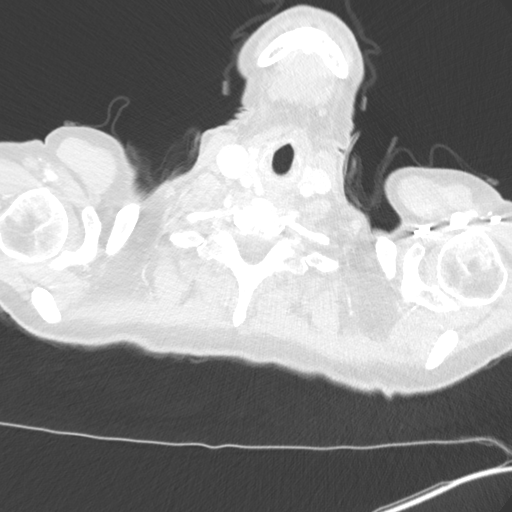

[Series 5: coronal · coronal · 0.65mm/px · 3 of 126 slices shown]
[im 26/126  lung]
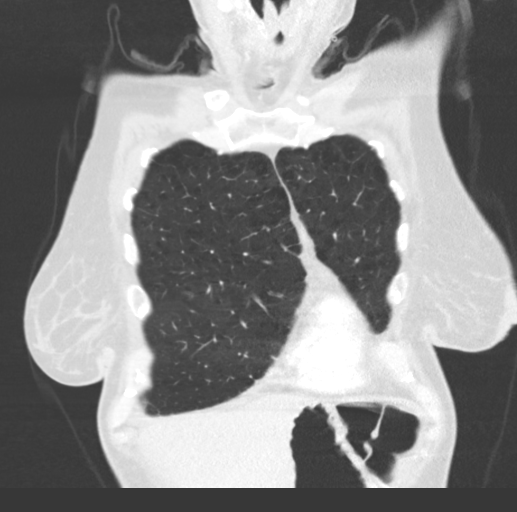
[im 51/126  lung]
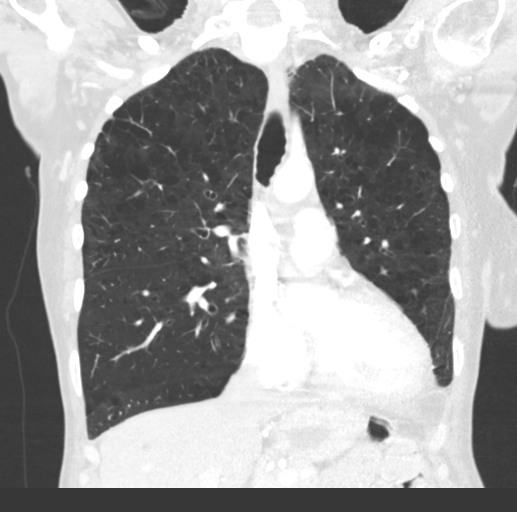
[im 76/126  lung]
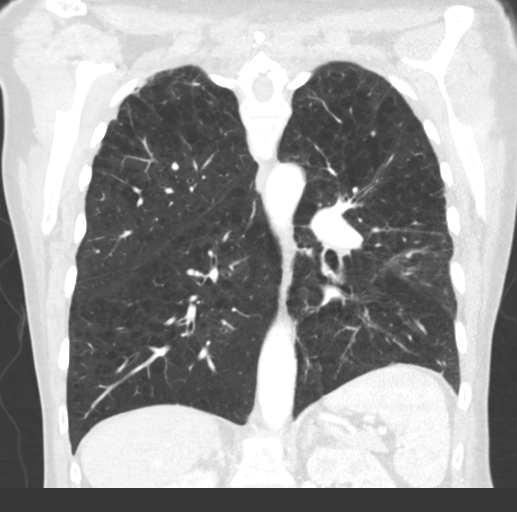

[15 of 36 positions shown; findings below may reference images not displayed]

FINDINGS: Cardiovascular: No significant vascular findings. Normal heart size.
No pericardial effusion.

Mediastinum/Nodes: No enlarged mediastinal, hilar, or axillary lymph
nodes. Thyroid gland, trachea, and esophagus demonstrate no
significant findings.

Lungs/Pleura: Severe centrilobular emphysema. There is an irregular
nodule of the posteromedial left upper lobe abutting the pleura,
measuring 2.4 x 1.6 cm (series 4, image 57). No pleural effusion or
pneumothorax.

Upper Abdomen: No acute abnormality.

Musculoskeletal: No chest wall mass or suspicious bone lesions
identified.
IMPRESSION: 1. There is an irregular nodule of the posteromedial left upper lobe
abutting the pleura, measuring 2.4 x 1.6 cm, as seen on prior MR
examination of the thoracic spine and highly concerning for primary
lung malignancy. Recommend multidisciplinary thoracic conference
referral for consideration of PET-CT metabolic characterization and
tissue sampling.
2. No evidence of lymphadenopathy or metastatic disease in the
chest.
3. Severe centrilobular emphysema.

Emphysema ([id number]).

## 2023-03-24 DIAGNOSIS — L89159 Pressure ulcer of sacral region, unspecified stage: Secondary | ICD-10-CM | POA: Diagnosis not present

## 2023-03-27 ENCOUNTER — Ambulatory Visit (HOSPITAL_COMMUNITY)
Admission: RE | Admit: 2023-03-27 | Discharge: 2023-03-27 | Disposition: A | Discharge status: Home / Self Care | Source: Ambulatory Visit | Attending: Radiation Oncology | Admitting: Radiation Oncology

## 2023-03-27 DIAGNOSIS — C3490 Malignant neoplasm of unspecified part of unspecified bronchus or lung: Secondary | ICD-10-CM | POA: Diagnosis not present

## 2023-03-27 DIAGNOSIS — J432 Centrilobular emphysema: Secondary | ICD-10-CM | POA: Diagnosis not present

## 2023-03-27 DIAGNOSIS — I700 Atherosclerosis of aorta: Secondary | ICD-10-CM | POA: Diagnosis not present

## 2023-03-27 DIAGNOSIS — C3431 Malignant neoplasm of lower lobe, right bronchus or lung: Secondary | ICD-10-CM | POA: Diagnosis not present

## 2023-04-01 ENCOUNTER — Telehealth: Payer: Self-pay | Admitting: *Deleted

## 2023-04-01 DIAGNOSIS — R7301 Impaired fasting glucose: Secondary | ICD-10-CM | POA: Diagnosis not present

## 2023-04-01 DIAGNOSIS — Z13820 Encounter for screening for osteoporosis: Secondary | ICD-10-CM | POA: Diagnosis not present

## 2023-04-01 DIAGNOSIS — E785 Hyperlipidemia, unspecified: Secondary | ICD-10-CM | POA: Diagnosis not present

## 2023-04-01 NOTE — Telephone Encounter (Signed)
 Returned patient's phone call, spoke with patient

## 2023-04-01 NOTE — Telephone Encounter (Signed)
 CALLED PATIENT'S DAUGHTER TO INFORM OF 04-08-23 APPT. BEING CANCELLED DUE TO DR. KINARD BEING OFF, APPT. RESCHEDULED FOR 04-06-23 @ 11 AM, PATIENT'S DAUGHTER TO TELL HER MOM AND HER MOM WILL CALL ME

## 2023-04-06 ENCOUNTER — Ambulatory Visit: Payer: Self-pay | Admitting: Radiation Oncology

## 2023-04-08 ENCOUNTER — Ambulatory Visit: Payer: Self-pay | Admitting: Radiation Oncology

## 2023-04-13 ENCOUNTER — Ambulatory Visit
Admission: RE | Admit: 2023-04-13 | Discharge: 2023-04-13 | Disposition: A | Discharge status: Home / Self Care | Source: Ambulatory Visit | Attending: Radiation Oncology | Admitting: Radiation Oncology

## 2023-04-14 DIAGNOSIS — L89153 Pressure ulcer of sacral region, stage 3: Secondary | ICD-10-CM | POA: Diagnosis not present

## 2023-04-15 DIAGNOSIS — F411 Generalized anxiety disorder: Secondary | ICD-10-CM | POA: Diagnosis not present

## 2023-04-15 DIAGNOSIS — C3492 Malignant neoplasm of unspecified part of left bronchus or lung: Secondary | ICD-10-CM | POA: Diagnosis not present

## 2023-04-15 DIAGNOSIS — E785 Hyperlipidemia, unspecified: Secondary | ICD-10-CM | POA: Diagnosis not present

## 2023-04-15 DIAGNOSIS — E870 Hyperosmolality and hypernatremia: Secondary | ICD-10-CM | POA: Diagnosis not present

## 2023-04-15 DIAGNOSIS — R7301 Impaired fasting glucose: Secondary | ICD-10-CM | POA: Diagnosis not present

## 2023-04-15 DIAGNOSIS — J449 Chronic obstructive pulmonary disease, unspecified: Secondary | ICD-10-CM | POA: Diagnosis not present

## 2023-04-15 DIAGNOSIS — K219 Gastro-esophageal reflux disease without esophagitis: Secondary | ICD-10-CM | POA: Diagnosis not present

## 2023-04-15 DIAGNOSIS — Z23 Encounter for immunization: Secondary | ICD-10-CM | POA: Diagnosis not present

## 2023-04-15 DIAGNOSIS — M810 Age-related osteoporosis without current pathological fracture: Secondary | ICD-10-CM | POA: Diagnosis not present

## 2023-05-02 NOTE — Progress Notes (Signed)
 Radiation Oncology         [phone] ________________________________  Name: Donna Watkins MRN: [id number]  Date: 05/03/2023  DOB: 02-06-1960  Follow-Up Visit Note  CC: Omie Bickers, MD  Madonna Schlein, MD  No diagnosis found.  Diagnosis:  The primary encounter diagnosis was Malignant neoplasm of lower lobe of right lung. A diagnosis of Nodule of lower lobe of right lung was also pertinent to this visit.   Enlarging lesion of the right lower lobe    Squamous cell carcinoma of the LUL, Clinical Stage IA-3, (T1c, N0, M0)     Interval Since Last Radiation: 11 months and 7 days    2) Indication for treatment: Curative       Radiation treatment dates: 05/19/22 through 05/26/22  Site/dose: Right lung - 54 Gy delivered in 3 Fx at 18 Gy/Fx Beams/energy:  6X-FFF Technique/Mode: SBRT/SRT-IMRT / Photon  1)  Intent: Curative Radiation Treatment Dates: 12/09/2020 through 12/19/2020 Site Technique Total Dose (Gy) Dose per Fx (Gy) Completed Fx Beam Energies  Lung, Left: Lung_Lt SBRT 50/50 10 5/5 6XFFF    Narrative:  The patient returns today for routine follow-up and to review recent imaging. She was last seen in office on 10/05/22 for a routine follow-up visit.   Since her last visit, she had continued to follow with Dr. Waymond Hailey at [facility] for management of COPD. Based on her most recent visit with Dr. Waymond Hailey from 11/27/22, her breathing has remained at baseline and she continues to require her nebulizer around 3-4 times daily. She also continues to use her supplemental O2 at 2L throughout the day.   Her most recent CT chest performed on 03/27/23 demonstrates: new patchy ground-glass and airspace densities within the posterior right lung base, favored to represent a sequelae of inflammation or infection; stability of the 5 mm right lower lobe lung nodule; and stable post-treatment changes involving the perihilar left lung.   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   No other significant interval history since the patient was last seen for follow-up.   ***   Allergies:  has no known allergies.  Meds: Current Outpatient Medications  Medication Sig Dispense Refill   albuterol  (VENTOLIN  HFA) 108 (90 Base) MCG/ACT inhaler Inhale 2 puffs into the lungs every 4 (four) hours as needed for wheezing or shortness of breath. 1 each 3   alendronate (FOSAMAX) 70 MG tablet Take 70 mg by mouth once a week.     ALPRAZolam  (XANAX ) 1 MG tablet Take 1 tablet (1 mg total) by mouth 3 (three) times daily as needed for anxiety. (Patient taking differently: Take 1 mg by mouth 5 (five) times daily.) 30 tablet 0   atorvastatin  (LIPITOR) 20 MG tablet Take 1 tablet (20 mg total) by mouth daily. Restart on 04/03/22     feeding supplement (ENSURE ENLIVE / ENSURE PLUS) LIQD Take 237 mLs by mouth 2 (two) times daily between meals. 237 mL 12   ipratropium-albuterol  (DUONEB) 0.5-2.5 (3) MG/3ML SOLN Take 3 mLs by nebulization 3 (three) times daily. And as needed for SOB 360 mL 3   Multiple Vitamins-Minerals (CENTRUM SILVER PO) Take 1 tablet by mouth daily.     mupirocin ointment (BACTROBAN) 2 % Apply 1 Application topically.     nutrition supplement, JUVEN, (JUVEN) PACK Take 1  packet by mouth 2 (two) times daily between meals.  0   omeprazole (PRILOSEC) 20 MG capsule Take 20 mg by mouth daily.     OXYGEN Place 3 L into the nose continuous.     No current facility-administered medications for this encounter.    Physical Findings: The patient is in no acute distress. Patient is alert and oriented.  vitals were not taken for this visit. .  No significant changes. Lungs are clear to auscultation bilaterally. Heart has regular rate and rhythm. No palpable cervical,  supraclavicular, or axillary adenopathy. Abdomen soft, non-tender, normal bowel sounds.   Lab Findings: Lab Results  Component Value Date   WBC 5.9 05/12/2022   HGB 11.4 05/12/2022   HCT 36.3 05/12/2022   MCV 96 05/12/2022   PLT 268 05/12/2022    Radiographic Findings: No results found.   Impression: Enlarging lesion of the right lower lobe   Squamous cell carcinoma of the LUL, Clinical Stage IA-3, (T1c, N0, M0)   The patient is recovering from the effects of radiation.  ***  Plan:  ***   *** minutes of total time was spent for this patient encounter, including preparation, face-to-face counseling with the patient and coordination of care, physical exam, and documentation of the encounter. ____________________________________  Noralee Beam, PhD, MD  This document serves as a record of services personally performed by Retta Caster, MD. It was created on his behalf by Aleta Anda, a trained medical scribe. The creation of this record is based on the scribe's personal observations and the provider's statements to them. This document has been checked and approved by the attending provider.

## 2023-05-03 ENCOUNTER — Encounter: Payer: Self-pay | Admitting: *Deleted

## 2023-05-03 ENCOUNTER — Ambulatory Visit
Admission: RE | Admit: 2023-05-03 | Discharge: 2023-05-03 | Disposition: A | Discharge status: Home / Self Care | Source: Ambulatory Visit | Attending: Radiation Oncology | Admitting: Radiation Oncology

## 2023-05-03 ENCOUNTER — Encounter: Payer: Self-pay | Admitting: Radiation Oncology

## 2023-05-03 VITALS — BP 126/74 | HR 101 | Resp 20 | Ht 63.0 in | Wt 102.6 lb

## 2023-05-03 DIAGNOSIS — C3412 Malignant neoplasm of upper lobe, left bronchus or lung: Secondary | ICD-10-CM | POA: Insufficient documentation

## 2023-05-03 DIAGNOSIS — Z923 Personal history of irradiation: Secondary | ICD-10-CM | POA: Insufficient documentation

## 2023-05-03 DIAGNOSIS — R911 Solitary pulmonary nodule: Secondary | ICD-10-CM | POA: Insufficient documentation

## 2023-05-03 DIAGNOSIS — C3431 Malignant neoplasm of lower lobe, right bronchus or lung: Secondary | ICD-10-CM | POA: Diagnosis not present

## 2023-05-03 DIAGNOSIS — Z87891 Personal history of nicotine dependence: Secondary | ICD-10-CM | POA: Diagnosis not present

## 2023-05-03 DIAGNOSIS — Z79899 Other long term (current) drug therapy: Secondary | ICD-10-CM | POA: Diagnosis not present

## 2023-05-03 NOTE — Progress Notes (Signed)
 Donna Watkins is here today for follow up post radiation to the lung.  Lung Side: Right, patient completed on 05/26/22. Completed treatment to left lung on 12/19/20.  Does the patient complain of any of the following: Pain:No Shortness of breath w/wo exertion: Yes, continues on oxygen at 2-3 liters via [place] Cough: No Hemoptysis: No Pain with swallowing: No Swallowing/choking concerns: No Appetite: Good,  Energy Level: Fair  Post radiation skin Changes: No     Additional comments if applicable:  Patient reports having biopsy to left breast that came back negative.  BP 126/74 (BP Location: Left Arm, Patient Position: Sitting, Cuff Size: Small)   Pulse (!) 101   Resp 20   Ht 5\' 3"  (1.6 m)   Wt 102 lb 9.6 oz (46.5 kg)   LMP 05/06/2010   SpO2 92%   PF (!) 2 L/min   BMI 18.17 kg/m

## 2023-05-05 DIAGNOSIS — L89153 Pressure ulcer of sacral region, stage 3: Secondary | ICD-10-CM | POA: Diagnosis not present

## 2023-05-23 NOTE — Progress Notes (Signed)
 Subjective:   Patient ID: Donna Watkins, female    DOB: Mar 22, 1960     MRN: [id number]    Brief patient profile:  13 yowf  MM/quit smoking in 2013 with severe copd at baseline last seen in pulmonary clinic  by [facility] 11/2013  GOLD IV criteria with PFTs 05/2012  with Fev1 only 26%, diffusion at 52%    History of Present Illness  07/31/2016 1st office visit/ Saree Krogh   GOLD IV copd/ 02 2lpm 24/7  Chief Complaint  Patient presents with   Pulmonary Consult    Pt is self referred for COPD. Pt c/o DOE, throat clearing. Pt denies CP/tightness anf recentl f/c/s. Pt states she rarely uses albuterol  hfa.   doe -  turns up 3lpm walking but still doe x 50 ft Use scooter at [employer] x 5 years  Sleeps on 4lpm / no am ha / cough or congestion  Uses neb avg bid, very poor  insight into meds/02  Some better breathing p neb saba rec  Call us  if you want to be referred to [facility] for pulmonary rehab - in meantime continue 2lpm at rest and 3lpm with activity    Please schedule a follow up visit in 3 months but call sooner if needed with full pfts on return   RT complete Dec 2022 tol ok dx sq cell ca LUL      Admit date:     03/25/2022  Discharge date: 03/28/22         Hospital Course: 63 year old female with a history of COPD, chronic respiratory failure on 3 L, lung cancer status post XRT, anxiety, hyperlipidemia presenting from home secondary to hypoxia and tachycardia.  The patient states that she had been in her usual state of health until 03/25/2022 when she was working with home health physical therapy.  Apparently, home health PT noted the patient to be hypoxic with ambulation into the 85% on her usual 3 L.  The patient apparently was tachycardic into the 100s.  As result, EMS was activated.  Upon EMS arrival, she was noted to have oxygen saturation in the high 80s.  Show her oxygen was increased to 4 L. In the ED, the patient was afebrile and hemodynamically stable.  She was  placed on BiPAP.  WBC 11.6, hemoglobin 12.9, platelets 141,000.  Sodium 142, potassium 3.7, bicarbonate >45.  Serum creatinine 0.44.  AST 211, ALT 670, alkaline phosphatase 97, total bilirubin 1.0.  CTA chest was negative for PE.  Showed a 1.9 x 1.4 cmright lower lobe nodule.  The patient was given DuoNebs and IV Solu-Medrol .  She was admitted for acute on chronic respiratory failure.   Assessment and Plan: Acute on chronic respiratory failure with hypoxia and hypercarbia -Secondary to COPD exacerbation -03/25/2022 VBG 7.32/>123/<31/73 -Weaned off continuous BiPAP back to baseline 3 L -she still used BiPAP at night -Pt already has NIV machine at home that she is compliant with   COPD exacerbation -COVID-19/RSV/Flu negative -continue  brovana  -continued pulmicort  -continued yupleri -03/25/2022 CTA chest negative for PE -IV solumedrol>>d/c home with prednisone  taper   Transaminasemia -Patient is hepatitis A immune -Hepatitis B and C--neg -right upper quadrant ultrasound---Diffuse gallbladder wall calcifications and shadowing consistent with the history of porcelain gallbladder. Evaluation for stones is limited. There is gallbladder wall thickening but no adjacent fluid -trend LFTs--improving -03/25/2022 CT abdomen--porcelain gallbladder; large stool burden; soft tissue stranding around the sacrum -discussed with general surgery, Dr. 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 Abbe Hoard indication presently for operative intervention   Unstageable sacral decubitus ulcer -Wound care consult -There is some mild cellulitis surrounding the wound -appreciate surgery>>debrided -received vanco during hospitalization D/c home with doxy x 4 more days   Anxiety -Continue home dose alprazolam  -PDMP reviewed -Patient received Xanax  1 mg, #150 monthly, last refill  02/19/2022   Hypomagnesemia/hypophosphatemia -Repleted   Mixed hyperlipidemia -Holding statin secondary to elevated LFTs   GERD -Continue PPI   Thrombocytopenia -Appears to be chronic -monitor for signs of bleeding   Moderate malnutrition -continue supplements     08/07/2022  f/u ov/[place] office/Liandro Thelin re: COPD GOLD  4/ 02 dep maint on duoneb bid  and NIV noct  Chief Complaint  Patient presents with   COPD    Gold IV   Dyspnea:  shops but uses scooter at [employer]/ pushes cart on 2.5 lpm at [employer] Cough: none  Sleeping: on NIV per [employer]  SABA use: rarely hfa  02: 3lpm at house  Rec No change in your medications Make sure you check your oxygen saturation  AT  your highest level of activity (not after you stop)   to be sure it stays over low  90s% Please schedule a follow up visit in 3 months but call sooner if needed     11/27/2022  f/u ov/[place] office/Tremont Gavitt re: GOLD 4 COPD /02 dep  maint on duoneb and NIV   Chief Complaint  Patient presents with   Follow-up    Breathing is at baseline for her today. No new co's.   Dyspnea:  uses scooter unless just at store for a few items, using [facility] parking  Cough: none Sleeping: NIV up to 8 h s resp cc  SABA use: neb 3-4 x per day  02: 3lpm wears NP at bed and NIV mask  - 3 liter conc at home but 2lpm cont tank  Lung cancer screening: done 10/01/22  Rec Call your NIV company to ask if you can put the 02 tubing from your  concentrator into your NIV  Make sure you check your oxygen saturation  AT  your highest level of activity (not after you stop)   to be sure it stays over 90%  Please schedule a follow up office visit in 6 months , call sooner if needed     05/26/2023  f/u ov/[place] office/Cassius Cullinane re: GOLD 4 copd  maint on 02/ noct  NIV / duoneb tid   Chief Complaint  Patient presents with   Shortness of Breath   COPD   Dyspnea:  walk at [employer] but not [employer] / not checking sats as rec  Cough: none  Sleeping: flat  bed/ 2 pillows and NIV s resp cc  SABA use: rarely  02: 3lpm at home x just on NIV at noct/ /2-2.5 lpm cont outside   Lung cancer screening: CT done 03/27/23 > f/u [place]    No obvious day to day or daytime variability or assoc excess/ purulent sputum or mucus plugs or hemoptysis or cp or chest tightness, subjective wheeze or overt sinus or hb symptoms.    Also denies any obvious fluctuation of symptoms with weather or environmental changes or other aggravating or alleviating factors except as outlined above   No unusual exposure hx or h/o childhood pna/ asthma or knowledge of premature birth.  Current Allergies, Complete Past Medical History, Past Surgical History, Family  History, and Social History were reviewed in [employer] record.  ROS  The following are not active complaints unless bolded Hoarseness, sore throat, dysphagia, dental problems, itching, sneezing,  nasal congestion or discharge of excess mucus or purulent secretions, ear ache,   fever, chills, sweats, unintended wt loss or wt gain, classically pleuritic or exertional cp,  orthopnea pnd or arm/hand swelling  or leg swelling, presyncope, palpitations, abdominal pain, anorexia, nausea, vomiting, diarrhea  or change in bowel habits or change in bladder habits, change in stools or change in urine, dysuria, hematuria,  rash, arthralgias, visual complaints, headache, numbness, weakness or ataxia or problems with walking or coordination,  change in mood or  memory.        Current Meds  Medication Sig   albuterol  (VENTOLIN  HFA) 108 (90 Base) MCG/ACT inhaler Inhale 2 puffs into the lungs every 4 (four) hours as needed for wheezing or shortness of breath.   alendronate (FOSAMAX) 70 MG tablet Take 70 mg by mouth once a week.   ALPRAZolam  (XANAX ) 1 MG tablet Take 1 tablet (1 mg total) by mouth 3 (three) times daily as needed for anxiety. (Patient taking differently: Take 1 mg by mouth 5 (five) times daily.)    atorvastatin  (LIPITOR) 20 MG tablet Take 1 tablet (20 mg total) by mouth daily. Restart on 04/03/22   feeding supplement (ENSURE ENLIVE / ENSURE PLUS) LIQD Take 237 mLs by mouth 2 (two) times daily between meals.   ipratropium-albuterol  (DUONEB) 0.5-2.5 (3) MG/3ML SOLN Take 3 mLs by nebulization 3 (three) times daily. And as needed for SOB   Multiple Vitamins-Minerals (CENTRUM SILVER PO) Take 1 tablet by mouth daily.   mupirocin ointment (BACTROBAN) 2 % Apply 1 Application topically.   nutrition supplement, JUVEN, (JUVEN) PACK Take 1 packet by mouth 2 (two) times daily between meals.   omeprazole (PRILOSEC) 20 MG capsule Take 20 mg by mouth daily.   OXYGEN Place 3 L into the nose continuous.                Objective:   Physical Exam  Wts  05/26/2023          101  11/27/2022        102  08/07/2022           100  05/12/2022            89  08/18/2021         112  02/19/2021         114  08/14/2020         120  02/27/2020         116 08/28/2019         107  05/26/2019       106  07/04/2018        129 01/03/2018      126  07/02/2017        128 12/31/2016      136   07/31/16 145 lb 9.6 oz (66 kg)  01/22/14 180 lb (81.6 kg)  05/23/13 180 lb 12.8 oz (82 kg)      01/03/18 126 lb (57.2 kg)  07/02/17 128 lb 9.6 oz (58.3 kg)  02/04/17 130 lb (59 kg)     Vital signs reviewed  05/26/2023  - Note at rest 02 sats  89% on 2.5 lpm cont   General appearance:    somewhat frail elderly amb wf nad   HEENT :  Oropharynx  clear   Nasal  turbinates nl    NECK :  without JVD/Nodes/TM/ nl carotid upstrokes bilaterally   LUNGS: no acc muscle use,  Mod barrel  contour chest wall with bilateral  Distant bs s audible wheeze and  without cough on insp or exp maneuvers and mod  Hyperresonant  to  percussion bilaterally     CV:  RRR  no s3 or murmur or increase in P2, and no edema   ABD:  soft and nontender    MS:   Ext warm without deformities or   obvious joint restrictions , calf tenderness, cyanosis or  clubbing  SKIN: warm and dry without lesions    NEURO:  alert, approp, nl sensorium with  no motor or cerebellar deficits apparent.                   Assessment & Plan:

## 2023-05-24 ENCOUNTER — Other Ambulatory Visit (HOSPITAL_COMMUNITY): Payer: Self-pay | Admitting: Internal Medicine

## 2023-05-24 DIAGNOSIS — Z1231 Encounter for screening mammogram for malignant neoplasm of breast: Secondary | ICD-10-CM

## 2023-05-26 ENCOUNTER — Ambulatory Visit (INDEPENDENT_AMBULATORY_CARE_PROVIDER_SITE_OTHER): Admitting: Internal Medicine

## 2023-05-26 ENCOUNTER — Encounter: Payer: Self-pay | Admitting: Internal Medicine

## 2023-05-26 VITALS — BP 132/70 | HR 73 | Ht 63.0 in | Wt 101.6 lb

## 2023-05-26 DIAGNOSIS — J9612 Chronic respiratory failure with hypercapnia: Secondary | ICD-10-CM

## 2023-05-26 DIAGNOSIS — J9611 Chronic respiratory failure with hypoxia: Secondary | ICD-10-CM

## 2023-05-26 DIAGNOSIS — J449 Chronic obstructive pulmonary disease, unspecified: Secondary | ICD-10-CM | POA: Diagnosis not present

## 2023-05-26 NOTE — Patient Instructions (Signed)
 No change in medication   Make sure you check your oxygen saturation  AT  your highest level of activity (not after you stop)   to be sure it stays over 90% and adjust  02 flow upward to maintain this level if needed but remember to turn it back to previous settings when you stop (to conserve your supply).   Duoneb from 1 twice daily to one four times a day   Please schedule a follow up visit in 6  months but call sooner if needed

## 2023-05-29 NOTE — Assessment & Plan Note (Signed)
 Quit smoking 2013/MM - PFT's  04/27/12  FEV1 0.70 (26 %) ratio 35  p no % improvement from saba p ? prior to study with DLCO  53 % corrects to 69 % for alv volume with classic curvature   - Alpha one AT screening   07/31/16 >   MM - PFT's  12/31/2016  FEV1 0.74 (28 % ) ratio 38  p no % improvement from saba p saba 4h   prior to study with DLCO  37/38c % corrects to 50 % for alv volume   - 07/02/2017   try bevespi : did not use correctly  - 01/03/2018    try bevespi  again > could not afford and could not tell really helped vs duoneb - 05/26/2019  After extensive coaching inhaler device,  effectiveness =    90% with smi > try stiolto> could not afford it on her plan >   - as of 08/14/2020 maint on duoneb qid  - NIV dep since admit 03/25/22 maint on duoneb   She is actually stabilized with NIV noct and just duoneb  up to qid so no changes needed

## 2023-05-29 NOTE — Assessment & Plan Note (Signed)
 07/31/2016  Patient Saturations on Room Air at Rest = 88% and while Ambulating = 83% - HC03  07/31/2016  = 38  - 07/31/2016   Walked 3lpm  2 laps @ 185 ft each stopped due to  Sob at moderate pace with no desat  - RA  = 85% 01/03/2018 corrected to 92 on 2lpm continuous  -  05/26/2019   Walked 2lpm cont x  approx   200 ft  @ moderate pace  stopped due to  Sob with sats still 91%   -  08/28/2019   Walked 2.5 lpm  approx   400 ft  @ moderate pace  stopped due to  Sob with sats still   90% - 02 sats at rst 85% RA 02/27/2020  - NIV as of 02/10/22 per [employer] - HCO3  05/12/2022    =  33   - 08/07/2022   Walked on RA  x  3  lap(s) =  approx 450  ft  @ slow pace, stopped due to end of study  with lowest 02 sats 89%   - 11/27/2022   Walked on 2.5 lpm cont   x  2  lap(s) =  approx 300  ft  @ slow pace, stopped due to sob with lowest 02 sats 91% but finished at 93%    Rec continue with NIV noct but titrate 02 flow daytime to sats aroud 90%          Each maintenance medication was reviewed in detail including emphasizing most importantly the difference between maintenance and prns and under what circumstances the prns are to be triggered using an action plan format where appropriate.  Total time for H and P, chart review, counseling, reviewing neb/ 02/ pulse ox/ NIV device(s) and generating customized AVS unique to this office visit / same day charting = 22 min

## 2023-06-02 DIAGNOSIS — L89153 Pressure ulcer of sacral region, stage 3: Secondary | ICD-10-CM | POA: Diagnosis not present

## 2023-06-14 ENCOUNTER — Ambulatory Visit (HOSPITAL_COMMUNITY)

## 2023-06-14 ENCOUNTER — Ambulatory Visit (HOSPITAL_COMMUNITY)
Admission: RE | Admit: 2023-06-14 | Discharge: 2023-06-14 | Disposition: A | Discharge status: Home / Self Care | Source: Ambulatory Visit | Attending: Internal Medicine | Admitting: Internal Medicine

## 2023-06-14 DIAGNOSIS — Z1231 Encounter for screening mammogram for malignant neoplasm of breast: Secondary | ICD-10-CM | POA: Diagnosis not present

## 2023-07-15 DIAGNOSIS — M810 Age-related osteoporosis without current pathological fracture: Secondary | ICD-10-CM | POA: Diagnosis not present

## 2023-07-15 DIAGNOSIS — F411 Generalized anxiety disorder: Secondary | ICD-10-CM | POA: Diagnosis not present

## 2023-10-14 DIAGNOSIS — Z13820 Encounter for screening for osteoporosis: Secondary | ICD-10-CM | POA: Diagnosis not present

## 2023-10-14 DIAGNOSIS — R7301 Impaired fasting glucose: Secondary | ICD-10-CM | POA: Diagnosis not present

## 2023-10-14 DIAGNOSIS — E785 Hyperlipidemia, unspecified: Secondary | ICD-10-CM | POA: Diagnosis not present

## 2023-10-21 DIAGNOSIS — F411 Generalized anxiety disorder: Secondary | ICD-10-CM | POA: Diagnosis not present

## 2023-10-21 DIAGNOSIS — E870 Hyperosmolality and hypernatremia: Secondary | ICD-10-CM | POA: Diagnosis not present

## 2023-10-21 DIAGNOSIS — Z0001 Encounter for general adult medical examination with abnormal findings: Secondary | ICD-10-CM | POA: Diagnosis not present

## 2023-10-21 DIAGNOSIS — Z0000 Encounter for general adult medical examination without abnormal findings: Secondary | ICD-10-CM | POA: Diagnosis not present

## 2023-10-21 DIAGNOSIS — J449 Chronic obstructive pulmonary disease, unspecified: Secondary | ICD-10-CM | POA: Diagnosis not present

## 2023-10-21 DIAGNOSIS — E785 Hyperlipidemia, unspecified: Secondary | ICD-10-CM | POA: Diagnosis not present

## 2023-10-21 DIAGNOSIS — C3490 Malignant neoplasm of unspecified part of unspecified bronchus or lung: Secondary | ICD-10-CM | POA: Diagnosis not present

## 2023-10-21 DIAGNOSIS — K219 Gastro-esophageal reflux disease without esophagitis: Secondary | ICD-10-CM | POA: Diagnosis not present

## 2023-10-21 DIAGNOSIS — M810 Age-related osteoporosis without current pathological fracture: Secondary | ICD-10-CM | POA: Diagnosis not present

## 2023-11-02 ENCOUNTER — Ambulatory Visit (HOSPITAL_COMMUNITY)
Admission: RE | Admit: 2023-11-02 | Discharge: 2023-11-02 | Disposition: A | Discharge status: Home / Self Care | Source: Ambulatory Visit | Attending: Radiation Oncology | Admitting: Radiation Oncology

## 2023-11-02 DIAGNOSIS — I700 Atherosclerosis of aorta: Secondary | ICD-10-CM | POA: Diagnosis not present

## 2023-11-02 DIAGNOSIS — R911 Solitary pulmonary nodule: Secondary | ICD-10-CM | POA: Diagnosis not present

## 2023-11-02 DIAGNOSIS — J8410 Pulmonary fibrosis, unspecified: Secondary | ICD-10-CM | POA: Diagnosis not present

## 2023-11-02 DIAGNOSIS — C3490 Malignant neoplasm of unspecified part of unspecified bronchus or lung: Secondary | ICD-10-CM | POA: Diagnosis not present

## 2023-11-02 DIAGNOSIS — J439 Emphysema, unspecified: Secondary | ICD-10-CM | POA: Diagnosis not present

## 2023-11-08 ENCOUNTER — Encounter: Payer: Self-pay | Admitting: Radiation Oncology

## 2023-11-08 ENCOUNTER — Ambulatory Visit
Admission: RE | Admit: 2023-11-08 | Discharge: 2023-11-08 | Disposition: A | Discharge status: Home / Self Care | Payer: Self-pay | Source: Ambulatory Visit | Attending: Radiation Oncology | Admitting: Radiation Oncology

## 2023-11-08 DIAGNOSIS — Z7983 Long term (current) use of bisphosphonates: Secondary | ICD-10-CM | POA: Insufficient documentation

## 2023-11-08 DIAGNOSIS — Z923 Personal history of irradiation: Secondary | ICD-10-CM | POA: Insufficient documentation

## 2023-11-08 DIAGNOSIS — C3412 Malignant neoplasm of upper lobe, left bronchus or lung: Secondary | ICD-10-CM | POA: Diagnosis not present

## 2023-11-08 DIAGNOSIS — Z79899 Other long term (current) drug therapy: Secondary | ICD-10-CM | POA: Insufficient documentation

## 2023-11-08 NOTE — Progress Notes (Signed)
 Radiation Oncology         [phone] ________________________________  Name: Donna Watkins MRN: [id number]  Date: 11/08/2023  DOB: 1960/03/07  Follow-Up Visit Note  CC: Shona Norleen PEDLAR, MD  Rossie Sartorius, MD    ICD-10-CM   1. 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)  C34.12       Diagnosis: Squamous cell carcinoma of the LUL, Clinical Stage IA-3, (T1c, N0, M0); s/p SBRT completed on 12/19/2020  Enlarging lesion of the RLL; s/p SBRT completed on 05/26/2022     Interval Since Last Radiation: 1 year, 5 months, and 13 days  2) Indication for treatment: Curative       Radiation treatment dates: 05/19/22 through 05/26/22  Site/dose: Right lung - 54 Gy delivered in 3 Fx at 18 Gy/Fx Beams/energy:  6X-FFF Technique/Mode: SBRT/SRT-IMRT / Photon   1)  Intent: Curative Radiation Treatment Dates: 12/09/2020 through 12/19/2020 Site Technique Total Dose (Gy) Dose per Fx (Gy) Completed Fx Beam Energies  Lung, Left: Lung_Lt SBRT 50/50 10 5/5 6XFFF    Narrative:  The patient returns today for routine follow-up visit and to review recent imaging. She was last seen here for follow-up on 05/03/23.                  In the interval since her last visit, she followed up Dr. Darlean at [facility] on 05/26/23. Her COPD and SOB were noted to unchanged at that time and she was advised to continue with her same inhaler regimen. She was also advised to continue using her supplemental O2 and to adjust her flow rate as needed with activity.   Her most recent chest CT on 11/02/23 demonstrates stability of the 5 mm RLL nodule, interval resolution of the previously demonstrated right lower lobe subpleural consolidation and ground-glass opacities with minimal residual scarring, consistent with a resolved infectious or inflammatory process; and stable appearing left posterior lung fibrosis and posttherapeutic changes.      Other imaging performed in the interval  includes a bilateral screening mammogram on 06/14/23 which showed no evidence of malignancy in either breast.      Patient notes shortness of breath with exertion. She continues on 3L of oxygen. Since receiving the RSV vaccine in the summer, she experiences episodes of chest tightness and palpitations. She also endorses a dry cough, but denies any hemoptysis. She denies any chest pain or shortness of breath without exertion.   Allergies:  has no known allergies.  Meds: Current Outpatient Medications  Medication Sig Dispense Refill   albuterol  (VENTOLIN  HFA) 108 (90 Base) MCG/ACT inhaler Inhale 2 puffs into the lungs every 4 (four) hours as needed for wheezing or shortness of breath. 1 each 3   alendronate (FOSAMAX) 70 MG tablet Take 70 mg by mouth once a week.     ALPRAZolam  (XANAX ) 1 MG tablet Take 1 tablet (1 mg total) by mouth 3 (three) times daily as needed for anxiety. (Patient taking differently: Take 1 mg by mouth 5 (five) times daily.) 30 tablet 0   atorvastatin  (LIPITOR) 20 MG tablet Take 1 tablet (20 mg total) by mouth daily. Restart on 04/03/22     feeding supplement (ENSURE ENLIVE / ENSURE PLUS) LIQD Take 237 mLs by mouth 2 (two) times daily between meals. 237 mL 12   ipratropium-albuterol  (DUONEB) 0.5-2.5 (3) MG/3ML SOLN Take 3 mLs by nebulization 3 (three) times daily. And as needed for SOB 360 mL 3   Multiple Vitamins-Minerals (  CENTRUM SILVER PO) Take 1 tablet by mouth daily.     mupirocin ointment (BACTROBAN) 2 % Apply 1 Application topically.     nutrition supplement, JUVEN, (JUVEN) PACK Take 1 packet by mouth 2 (two) times daily between meals.  0   omeprazole (PRILOSEC) 20 MG capsule Take 20 mg by mouth daily.     OXYGEN Place 3 L into the nose continuous.     No current facility-administered medications for this encounter.    Physical Findings: The patient is in no acute distress. Patient is alert and oriented.  height is 5' 3 (1.6 m) (pended) and weight is 101 lb 8 oz  (46 kg) (pended). Her temporal temperature is 97.5 F (36.4 C) (abnormal, pended). Her blood pressure is 133/79 (pended) and her pulse is 118 (abnormal, pended). Her respiration is 20 (pended). .    HR was recheck and was 107 bpm.   No significant changes. Lungs are clear to auscultation bilaterally. Heart has regular rate and rhythm. No palpable cervical, supraclavicular, or submental adenopathy. Abdomen soft, non-tender, normal bowel sounds.     Lab Findings: Lab Results  Component Value Date   WBC 5.9 05/12/2022   HGB 11.4 05/12/2022   HCT 36.3 05/12/2022   MCV 96 05/12/2022   PLT 268 05/12/2022    Radiographic Findings: CT CHEST WO CONTRAST Result Date: 11/04/2023 EXAM: CT CHEST WITHOUT CONTRAST 11/02/2023 06:03:53 PM TECHNIQUE: CT of the chest was performed without the administration of intravenous contrast. Multiplanar reformatted images are provided for review. Automated exposure control, iterative reconstruction, and/or weight based adjustment of the mA/kV was utilized to reduce the radiation dose to as low as reasonably achievable. COMPARISON: CT Chest without IV contrast 03/27/2023, 10/01/2022, 03/27/2022, 11/26/2021, 07/14/2021. CLINICAL HISTORY: Non-small cell lung cancer, non-metastatic, assess treatment response, nodule of lower lobe of right lung. FINDINGS: MEDIASTINUM: Heart and pericardium are unremarkable. Moderate aortic atherosclerosis. Mild coronary vascular calcifications. The central airways are clear. LYMPH NODES: Stable subcentimeter precarinal nodes. No hilar or axillary lymphadenopathy. LUNGS AND PLEURA: Moderate advanced emphysema. Stable distortion and fibrosis within the left posterior lung with fiducial marker noted. Nodule of interest in the right lower lobe measures 5 mm on series 4 image 115, previously 5 mm. Interval clearing of previously noted subpleural consolidation and ground-glass disease in the posterior right lower lobe with minimal residual scarring.  No pulmonary edema. No pleural effusion or pneumothorax. SOFT TISSUES/BONES: Moderate sclerotic compression fracture at T5 is stable as compared with 09/24. There is mild compression deformity of T6 which is likely stable to 03/2023 and new compared with 09/24. No acute abnormality of the soft tissues. UPPER ABDOMEN: Limited images of the upper abdomen demonstrates no acute abnormality. IMPRESSION: 1. Stable 5 mm right lower lobe pulmonary nodule 2. Interval resolution of prior right lower lobe subpleural consolidation and ground-glass opacities with minimal residual scarring consistent with resolved infectious or inflammatory process . 3. Stable left posterior lung fibrosis and posttherapeutic change 4. Stable T5 compression fracture. Mild compression fracture at T6 likely stable compared with march 2022. new compared with september 2022 5. Emphysema ( icd10-j43.9)  and aortic atherosclerosis ( icd 10-170.0 ) Electronically signed by: Luke Bun MD 11/04/2023 07:51 PM EDT RP Workstation: [id number]    Impression: Squamous cell carcinoma of the LUL, Clinical Stage IA-3, (T1c, N0, M0); s/p SBRT completed on 12/19/2020  Enlarging lesion of the RLL; s/p SBRT completed on 05/26/2022     Patient is doing well overall today with no significant residual side  effects form her radiation. We personally reviewed the results of her most recent CT scan. Imaging demonstrates stable changes with no evidence of disease recurrence or metastasis.   Encouraged patient to continue to check HR at home. If HR stays consistently elevated above 100 bpm, she should follow-up with her PCP to address this.   She continues with her pulmonologist, Dr. Darlean, for management of her lung disease.   Plan:  Follow up chest CT in 6 months. Routine office visit 1-2 days after scan to review results. Patient was encouraged to call with any questions or concerns in the meantime.     20 minutes of total time was spent for this patient  encounter, including preparation, face-to-face counseling with the patient and coordination of care, physical exam, and documentation of the encounter. ____________________________________   Leeroy Due, PA-C   This document serves as a record of services personally performed by Leeroy Due, PA-C. It was created on his behalf by Dorthy Fuse, a trained medical scribe. The creation of this record is based on the scribe's personal observations and the provider's statements to them. This document has been checked and approved by the attending provider.

## 2023-11-08 NOTE — Progress Notes (Signed)
 Donna Watkins is here today for follow up post radiation to the lung.  Lung Side: Bilateral lungs  Does the patient complain of any of the following: Pain:No Shortness of breath w/wo exertion: Yes, mostly on exertion. Patient currently on oxygen 3 L via [place]. Patient reports a heaviness to chest since she received RSV vaccine. Patient states she also noticed that her heart rate starts to race.  Cough: Yes,dry  Hemoptysis: No Pain with swallowing: No Swallowing/choking concerns: No Appetite: Good  Energy Level: Fair  Post radiation skin Changes: No    Additional comments if applicable:   BP (P) 133/79 (BP Location: Left Arm, Patient Position: Sitting)   Pulse (!) (P) 118   Temp (!) (P) 97.5 F (36.4 C) (Temporal)   Resp (P) 20   Ht (P) 5' 3 (1.6 m)   Wt (P) 101 lb 8 oz (46 kg)   LMP 05/06/2010   BMI (P) 17.98 kg/m

## 2023-11-22 ENCOUNTER — Ambulatory Visit (INDEPENDENT_AMBULATORY_CARE_PROVIDER_SITE_OTHER): Admitting: Internal Medicine

## 2023-11-22 ENCOUNTER — Encounter: Payer: Self-pay | Admitting: Internal Medicine

## 2023-11-22 VITALS — BP 118/66 | HR 111 | Ht 63.0 in | Wt 102.2 lb

## 2023-11-22 DIAGNOSIS — J9612 Chronic respiratory failure with hypercapnia: Secondary | ICD-10-CM | POA: Diagnosis not present

## 2023-11-22 DIAGNOSIS — J9611 Chronic respiratory failure with hypoxia: Secondary | ICD-10-CM | POA: Diagnosis not present

## 2023-11-22 DIAGNOSIS — J449 Chronic obstructive pulmonary disease, unspecified: Secondary | ICD-10-CM | POA: Diagnosis not present

## 2023-11-22 NOTE — Progress Notes (Unsigned)
 Subjective:   Patient ID: Donna Watkins, female    DOB: 12-Feb-1960     MRN: [id number]    Brief patient profile:  81 yowf  MM/quit smoking in 2013 with severe copd at baseline last seen in pulmonary clinic  by [facility] 11/2013  GOLD IV criteria with PFTs 05/2012  with Fev1 only 26%, diffusion at 52%    History of Present Illness  07/31/2016 1st office visit/ Donna Watkins   GOLD IV copd/ 02 2lpm 24/7  Chief Complaint  Patient presents with   Pulmonary Consult    Pt is self referred for COPD. Pt c/o DOE, throat clearing. Pt denies CP/tightness anf recentl f/c/s. Pt states she rarely uses albuterol  hfa.   doe -  turns up 3lpm walking but still doe x 50 ft Use scooter at [employer] x 5 years  Sleeps on 4lpm / no am ha / cough or congestion  Uses neb avg bid, very poor  insight into meds/02  Some better breathing p neb saba rec  Call us  if you want to be referred to [facility] for pulmonary rehab - in meantime continue 2lpm at rest and 3lpm with activity    Please schedule a follow up visit in 3 months but call sooner if needed with full pfts on return   RT complete Dec 2022 tol ok dx sq cell ca LUL         11/27/2022  f/u ov/[facility] office/Donna Watkins re: GOLD 4 COPD /02 dep  maint on duoneb and NIV   Chief Complaint  Patient presents with   Follow-up    Breathing is at baseline for her today. No new co's.   Dyspnea:  uses scooter unless just at store for a few items, using [facility] parking  Cough: none Sleeping: NIV up to 8 h s resp cc  SABA use: neb 3-4 x per day  02: 3lpm wears NP at bed and NIV mask  - 3 liter conc at home but 2lpm cont tank  Lung cancer screening: done 10/01/22  Rec Call your NIV company to ask if you can put the 02 tubing from your  concentrator into your NIV  Make sure you check your oxygen saturation  AT  your highest level of activity (not after you stop)   to be sure it stays over 90%  Please schedule a follow up office visit in 6 months , call sooner if  needed     05/26/2023  f/u ov/Donna Watkins office/Donna Watkins re: GOLD 4 copd  maint on 02/ noct  NIV / duoneb tid   Chief Complaint  Patient presents with   Shortness of Breath   COPD   Dyspnea:  walk at [employer] but not [employer] / not checking sats as rec  Cough: none  Sleeping: flat bed/ 2 pillows and NIV s resp cc  SABA use: rarely  02: 3lpm at home x just on NIV at noct/ /2-2.5 lpm cont outside  Lung cancer screening: CT done 03/27/23 > f/u [place]  Rec No change in medication  Make sure you check your oxygen saturation  AT  your highest level of activity (not after you stop)   to be sure it stays over 90%   Duoneb from 1 twice daily to one four times a day     LDSCT  11/02/23  No new findings    11/22/2023 47m  f/u ov/ office/Donna Watkins re: GOLD 4 copd  maint on 02/ Noct NIV  maint on duoneb  bid  Chief Complaint  Patient presents with   Medical Management of Chronic Issues   COPD    Breathing is about the same. She has had increased weakness over the past 2 wks.   Dyspnea:  walks at [employer] at 3lpm with sats in 70s and does not titrate as afraid 02 will run out Cough: none  Sleeping: flat bed 2 pillows on NIV most nights doing fine     SABA use: duoneb twice daily  02: 3lpm sleeping into NIV / 3lpm sitting / 3 lpm walking   No obvious day to day or daytime variability or assoc excess/ purulent sputum or mucus plugs or hemoptysis or cp or chest tightness, subjective wheeze or overt sinus or hb symptoms.    Also denies any obvious fluctuation of symptoms with weather or environmental changes or other aggravating or alleviating factors except as outlined above   No unusual exposure hx or h/o childhood pna/ asthma or knowledge of premature birth.  Current Allergies, Complete Past Medical History, Past Surgical History, Family History, and Social History were reviewed in [employer] record.  ROS  The following are not active complaints unless  bolded Hoarseness, sore throat, dysphagia, dental problems, itching, sneezing,  nasal congestion or discharge of excess mucus or purulent secretions, ear ache,   fever, chills, sweats, unintended wt loss or wt gain, classically pleuritic or exertional cp,  orthopnea pnd or arm/hand swelling  or leg swelling, presyncope, palpitations, abdominal pain, anorexia, nausea, vomiting, diarrhea  or change in bowel habits or change in bladder habits, change in stools or change in urine, dysuria, hematuria,  rash, arthralgias, visual complaints, headache, numbness, weakness or ataxia or problems with walking or coordination,  change in mood or  memory.        Current Meds  Medication Sig   albuterol  (VENTOLIN  HFA) 108 (90 Base) MCG/ACT inhaler Inhale 2 puffs into the lungs every 4 (four) hours as needed for wheezing or shortness of breath.   alendronate (FOSAMAX) 70 MG tablet Take 70 mg by mouth once a week.   ALPRAZolam  (XANAX ) 1 MG tablet Take 1 tablet (1 mg total) by mouth 3 (three) times daily as needed for anxiety.   atorvastatin  (LIPITOR) 20 MG tablet Take 1 tablet (20 mg total) by mouth daily. Restart on 04/03/22   ipratropium-albuterol  (DUONEB) 0.5-2.5 (3) MG/3ML SOLN Take 3 mLs by nebulization 3 (three) times daily. And as needed for SOB   Multiple Vitamins-Minerals (CENTRUM SILVER PO) Take 1 tablet by mouth daily.   mupirocin ointment (BACTROBAN) 2 % Apply 1 Application topically.   omeprazole (PRILOSEC) 20 MG capsule Take 20 mg by mouth daily.   OXYGEN Place 3 L into the nose continuous.                   Objective:   Physical Exam  Wts  11/22/2023         102  05/26/2023          101  11/27/2022        102  08/07/2022           100  05/12/2022            89  08/18/2021         112  02/19/2021         114  08/14/2020         120  02/27/2020  116 08/28/2019         107  05/26/2019       106  07/04/2018        129 01/03/2018      126  07/02/2017        128 12/31/2016      136    07/31/16 145 lb 9.6 oz (66 kg)  01/22/14 180 lb (81.6 kg)  05/23/13 180 lb 12.8 oz (82 kg)      01/03/18 126 lb (57.2 kg)  07/02/17 128 lb 9.6 oz (58.3 kg)  02/04/17 130 lb (59 kg)       Vital signs reviewed  11/22/2023  - Note at rest 02 sats  92% on 3lpm cont    General appearance:    thin amb wf / unusual somewhat child like affect  HEENT :  Oropharynx  clear/ edentulous      NECK :  without JVD/Nodes/TM/ nl carotid upstrokes bilaterally   LUNGS: no acc muscle use,  Mod barrel  contour chest wall with bilateral  Distant bs s audible wheeze and  without cough on insp or exp maneuvers and mod  Hyperresonant  to  percussion bilaterally     CV:  RRR  no s3 or murmur or increase in P2, and no edema   ABD:  soft and nontender with pos mid insp Hoover's  in the supine position. No bruits or organomegaly appreciated, bowel sounds nl  MS:   Ext warm without deformities or   obvious joint restrictions , calf tenderness, cyanosis or clubbing  SKIN: warm and dry without lesions    NEURO:  alert, approp, nl sensorium with  no motor or cerebellar deficits apparent.            I personally reviewed images and agree with radiology impression as follows:   Chest CTs contrast 11/02/23 1. Stable 5 mm right lower lobe pulmonary nodule 2. Interval resolution of prior right lower lobe subpleural consolidation and ground-glass opacities with minimal residual scarring consistent with resolved infectious or inflammatory process . 3. Stable left posterior lung fibrosis and posttherapeutic change 4. Stable T5 compression fracture. Mild compression fracture at T6 likely stable compared with march 2022. new compared with september 2022 5. Emphysema ( icd10-j43.9)  and aortic atherosclerosis ( icd 10-170.0 )                      Assessment & Plan:   Assessment & Plan End stage COPD (HCC) Quit smoking 2013/MM - PFT's  04/27/12  FEV1 0.70 (26 %) ratio 35  p no % improvement from saba p ?  prior to study with DLCO  53 % corrects to 69 % for alv volume with classic curvature   - Alpha one AT screening   07/31/16 >   MM - PFT's  12/31/2016  FEV1 0.74 (28 % ) ratio 38  p no % improvement from saba p saba 4h   prior to study with DLCO  37/38c % corrects to 50 % for alv volume   - 07/02/2017   try bevespi : did not use correctly  - 01/03/2018    try bevespi  again > could not afford and could not tell really helped vs duoneb - 05/26/2019  After extensive coaching inhaler device,  effectiveness =    90% with smi > try stiolto> could not afford it on her plan >   - as of 08/14/2020 maint on duoneb qid  - NIV dep since  admit 03/25/22 maint on 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   >>> suggested she continue to time her activities to 3-4 h p duoneb and consider  trial of lama/laba in neb form if insurance will cover   Chronic respiratory failure with hypoxia and hypercapnia (HCC) 07/31/2016  Patient Saturations on Room Air at Rest = 88% and while Ambulating = 83% - [facility]  07/31/2016  = 38  - 07/31/2016   Walked 3lpm  2 laps @ 185 ft each stopped due to  Sob at moderate pace with no desat  - RA  = 85% 01/03/2018 corrected to 92 on 2lpm continuous  -  05/26/2019   Walked 2lpm cont x  approx   200 ft  @ moderate pace  stopped due to  Sob with sats still 91%   -  08/28/2019   Walked 2.5 lpm  approx   400 ft  @ moderate pace  stopped due to  Sob with sats still   90% - 02 sats at rst 85% RA 02/27/2020  - NIV as of 02/10/22 per adapt - HCO3  05/12/2022    =  33   - 08/07/2022   Walked on RA  x  3  lap(s) =  approx 450  ft  @ slow pace, stopped due to end of study  with lowest 02 sats 89%   - 11/27/2022   Walked on 2.5 lpm cont   x  2  lap(s) =  approx 300  ft  @ slow pace, stopped due to sob with lowest 02 sats 91% but finished at 93%   - 11/22/2023    after 1.5 laps sats dropped to 85% 3lpm and she needed 6lpm to recover to 93%- walked an additional 1/2 lap and sats at end 93% 6lpm/average pace cc /SOB and fatigue     Again advised to titrate to sats>90% with exertion as above but remember to turn 02 back down to whatever she needs to maintain same sats rest  ie needs more 02 when walks and less when sits and should have plenty of 02 for more activities if desired.  Discussed in detail all the  indications, usual  risks and alternatives  relative to the benefits with patient who agrees to proceed with Rx as outlined.      Each maintenance medication was reviewed in detail including emphasizing most importantly the difference between maintenance and prns and under what circumstances the prns are to be triggered using an action plan format where appropriate.  Total time for H and P, chart review, counseling, reviewing neb/ 02 / pulse ox  device(s) , directly observing portions of ambulatory 02 saturation study/ and generating customized AVS unique to this office visit / same day charting = 31 min                  AVS  Patient Instructions  Time your activities for about 15 minutes after your duoneb treatments  Make sure you check your oxygen saturation  AT  your highest level of activity (not after you stop)   to be sure it stays over 90% and adjust  02 flow upward to maintain this level if needed but remember to turn it back to previous settings when you stop (to conserve your  supply).   Please schedule a follow up visit in 6 months but call sooner if needed   Add consider lama / laba trial per neb if insurance will cover   Ozell America, MD 11/23/2023

## 2023-11-22 NOTE — Patient Instructions (Signed)
 Time your activities for about 15 minutes after your duoneb treatments  Make sure you check your oxygen saturation  AT  your highest level of activity (not after you stop)   to be sure it stays over 90% and adjust  02 flow upward to maintain this level if needed but remember to turn it back to previous settings when you stop (to conserve your supply).   Please schedule a follow up visit in 6 months but call sooner if needed   Add consider lama / laba trial per neb if insurance will cover

## 2023-11-23 NOTE — Assessment & Plan Note (Addendum)
 07/31/2016  Patient Saturations on Room Air at Rest = 88% and while Ambulating = 83% - [facility]  07/31/2016  = 38  - 07/31/2016   Walked 3lpm  2 laps @ 185 ft each stopped due to  Sob at moderate pace with no desat  - RA  = 85% 01/03/2018 corrected to 92 on 2lpm continuous  -  05/26/2019   Walked 2lpm cont x  approx   200 ft  @ moderate pace  stopped due to  Sob with sats still 91%   -  08/28/2019   Walked 2.5 lpm  approx   400 ft  @ moderate pace  stopped due to  Sob with sats still   90% - 02 sats at rst 85% RA 02/27/2020  - NIV as of 02/10/22 per adapt - HCO3  05/12/2022    =  33   - 08/07/2022   Walked on RA  x  3  lap(s) =  approx 450  ft  @ slow pace, stopped due to end of study  with lowest 02 sats 89%   - 11/27/2022   Walked on 2.5 lpm cont   x  2  lap(s) =  approx 300  ft  @ slow pace, stopped due to sob with lowest 02 sats 91% but finished at 93%   - 11/22/2023   after 1.5 laps sats dropped to 85% 3lpm and she needed 6lpm to recover to 93%- walked an additional 1/2 lap and sats at end 93% 6lpm/average pace cc /SOB and fatigue     Again advised to titrate to sats>90% with exertion as above but remember to turn 02 back down to whatever she needs to maintain same sats rest  ie needs more 02 when walks and less when sits and should have plenty of 02 for more activities if desired.  Discussed in detail all the  indications, usual  risks and alternatives  relative to the benefits with patient who agrees to proceed with Rx as outlined.      Each maintenance medication was reviewed in detail including emphasizing most importantly the difference between maintenance and prns and under what circumstances the prns are to be triggered using an action plan format where appropriate.  Total time for H and P, chart review, counseling, reviewing neb/ 02 / pulse ox  device(s) , directly observing portions of ambulatory 02 saturation study/ and generating customized AVS unique to this office visit / same day charting = 31  min

## 2023-11-23 NOTE — Assessment & Plan Note (Addendum)
 Quit smoking 2013/MM - PFT's  04/27/12  FEV1 0.70 (26 %) ratio 35  p no % improvement from saba p ? prior to study with DLCO  53 % corrects to 69 % for alv volume with classic curvature   - Alpha one AT screening   07/31/16 >   MM - PFT's  12/31/2016  FEV1 0.74 (28 % ) ratio 38  p no % improvement from saba p saba 4h   prior to study with DLCO  37/38c % corrects to 50 % for alv volume   - 07/02/2017   try bevespi : did not use correctly  - 01/03/2018    try bevespi  again > could not afford and could not tell really helped vs duoneb - 05/26/2019  After extensive coaching inhaler device,  effectiveness =    90% with smi > try stiolto> could not afford it on her plan >   - as of 08/14/2020 maint on duoneb qid  - NIV dep since admit 03/25/22 maint on 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   >>> suggested she continue to time her activities to 3-4 h p duoneb and consider  trial of lama/laba in neb form if insurance will cover

## 2023-12-15 ENCOUNTER — Telehealth: Payer: Self-pay

## 2023-12-15 NOTE — Telephone Encounter (Signed)
 Copied from CRM [id number]. Topic: Clinical - Order For Equipment >> Dec 14, 2023 11:07 AM Donna Watkins wrote: Reason for CRM: Patient is requesting Dr. Darlean to please order her a new nebulizer and to send the prescription to [facility].  Please advise

## 2023-12-17 ENCOUNTER — Other Ambulatory Visit: Payer: Self-pay

## 2023-12-17 DIAGNOSIS — J439 Emphysema, unspecified: Secondary | ICD-10-CM

## 2023-12-17 DIAGNOSIS — J441 Chronic obstructive pulmonary disease with (acute) exacerbation: Secondary | ICD-10-CM

## 2023-12-17 NOTE — Telephone Encounter (Signed)
 Placed order for neb

## 2024-01-27 ENCOUNTER — Emergency Department (HOSPITAL_COMMUNITY)

## 2024-01-27 ENCOUNTER — Inpatient Hospital Stay (HOSPITAL_COMMUNITY)
Admission: EM | Admit: 2024-01-27 | Discharge: 2024-02-04 | DRG: 208 | Disposition: A | Discharge status: Skilled Nursing Facility | Attending: Internal Medicine | Admitting: Internal Medicine

## 2024-01-27 ENCOUNTER — Inpatient Hospital Stay (HOSPITAL_COMMUNITY)

## 2024-01-27 ENCOUNTER — Encounter (HOSPITAL_COMMUNITY): Payer: Self-pay | Admitting: Emergency Medicine

## 2024-01-27 ENCOUNTER — Other Ambulatory Visit: Payer: Self-pay

## 2024-01-27 DIAGNOSIS — J9621 Acute and chronic respiratory failure with hypoxia: Principal | ICD-10-CM | POA: Diagnosis present

## 2024-01-27 DIAGNOSIS — R259 Unspecified abnormal involuntary movements: Secondary | ICD-10-CM | POA: Diagnosis not present

## 2024-01-27 DIAGNOSIS — Q0700 Arnold-Chiari syndrome without spina bifida or hydrocephalus: Secondary | ICD-10-CM | POA: Diagnosis not present

## 2024-01-27 DIAGNOSIS — E874 Mixed disorder of acid-base balance: Secondary | ICD-10-CM | POA: Diagnosis present

## 2024-01-27 DIAGNOSIS — L89151 Pressure ulcer of sacral region, stage 1: Secondary | ICD-10-CM | POA: Diagnosis present

## 2024-01-27 DIAGNOSIS — N189 Chronic kidney disease, unspecified: Secondary | ICD-10-CM | POA: Diagnosis present

## 2024-01-27 DIAGNOSIS — J189 Pneumonia, unspecified organism: Secondary | ICD-10-CM | POA: Diagnosis present

## 2024-01-27 DIAGNOSIS — L8990 Pressure ulcer of unspecified site, unspecified stage: Secondary | ICD-10-CM | POA: Diagnosis not present

## 2024-01-27 DIAGNOSIS — K219 Gastro-esophageal reflux disease without esophagitis: Secondary | ICD-10-CM | POA: Diagnosis not present

## 2024-01-27 DIAGNOSIS — D631 Anemia in chronic kidney disease: Secondary | ICD-10-CM | POA: Diagnosis present

## 2024-01-27 DIAGNOSIS — Z85118 Personal history of other malignant neoplasm of bronchus and lung: Secondary | ICD-10-CM

## 2024-01-27 DIAGNOSIS — G9340 Encephalopathy, unspecified: Secondary | ICD-10-CM | POA: Diagnosis not present

## 2024-01-27 DIAGNOSIS — R569 Unspecified convulsions: Secondary | ICD-10-CM | POA: Diagnosis not present

## 2024-01-27 DIAGNOSIS — Z9981 Dependence on supplemental oxygen: Secondary | ICD-10-CM | POA: Diagnosis not present

## 2024-01-27 DIAGNOSIS — G935 Compression of brain: Secondary | ICD-10-CM | POA: Diagnosis present

## 2024-01-27 DIAGNOSIS — Z923 Personal history of irradiation: Secondary | ICD-10-CM

## 2024-01-27 DIAGNOSIS — E870 Hyperosmolality and hypernatremia: Secondary | ICD-10-CM | POA: Diagnosis present

## 2024-01-27 DIAGNOSIS — F419 Anxiety disorder, unspecified: Secondary | ICD-10-CM | POA: Diagnosis present

## 2024-01-27 DIAGNOSIS — G9341 Metabolic encephalopathy: Secondary | ICD-10-CM | POA: Diagnosis present

## 2024-01-27 DIAGNOSIS — J441 Chronic obstructive pulmonary disease with (acute) exacerbation: Secondary | ICD-10-CM | POA: Diagnosis present

## 2024-01-27 DIAGNOSIS — J9602 Acute respiratory failure with hypercapnia: Secondary | ICD-10-CM | POA: Diagnosis not present

## 2024-01-27 DIAGNOSIS — J9601 Acute respiratory failure with hypoxia: Secondary | ICD-10-CM | POA: Diagnosis not present

## 2024-01-27 DIAGNOSIS — Z825 Family history of asthma and other chronic lower respiratory diseases: Secondary | ICD-10-CM

## 2024-01-27 DIAGNOSIS — Z681 Body mass index (BMI) 19 or less, adult: Secondary | ICD-10-CM | POA: Diagnosis not present

## 2024-01-27 DIAGNOSIS — K828 Other specified diseases of gallbladder: Secondary | ICD-10-CM | POA: Diagnosis present

## 2024-01-27 DIAGNOSIS — F13239 Sedative, hypnotic or anxiolytic dependence with withdrawal, unspecified: Secondary | ICD-10-CM | POA: Diagnosis present

## 2024-01-27 DIAGNOSIS — D696 Thrombocytopenia, unspecified: Secondary | ICD-10-CM | POA: Diagnosis present

## 2024-01-27 DIAGNOSIS — J439 Emphysema, unspecified: Secondary | ICD-10-CM | POA: Diagnosis present

## 2024-01-27 DIAGNOSIS — I82409 Acute embolism and thrombosis of unspecified deep veins of unspecified lower extremity: Secondary | ICD-10-CM | POA: Diagnosis not present

## 2024-01-27 DIAGNOSIS — R4182 Altered mental status, unspecified: Secondary | ICD-10-CM | POA: Diagnosis not present

## 2024-01-27 DIAGNOSIS — E860 Dehydration: Secondary | ICD-10-CM | POA: Diagnosis present

## 2024-01-27 DIAGNOSIS — R54 Age-related physical debility: Secondary | ICD-10-CM | POA: Diagnosis present

## 2024-01-27 DIAGNOSIS — Z66 Do not resuscitate: Secondary | ICD-10-CM | POA: Diagnosis present

## 2024-01-27 DIAGNOSIS — Z91199 Patient's noncompliance with other medical treatment and regimen due to unspecified reason: Secondary | ICD-10-CM

## 2024-01-27 DIAGNOSIS — F32A Depression, unspecified: Secondary | ICD-10-CM | POA: Diagnosis present

## 2024-01-27 DIAGNOSIS — Z8261 Family history of arthritis: Secondary | ICD-10-CM

## 2024-01-27 DIAGNOSIS — Z8249 Family history of ischemic heart disease and other diseases of the circulatory system: Secondary | ICD-10-CM

## 2024-01-27 DIAGNOSIS — Z87891 Personal history of nicotine dependence: Secondary | ICD-10-CM

## 2024-01-27 DIAGNOSIS — E43 Unspecified severe protein-calorie malnutrition: Secondary | ICD-10-CM | POA: Diagnosis present

## 2024-01-27 DIAGNOSIS — J9622 Acute and chronic respiratory failure with hypercapnia: Secondary | ICD-10-CM | POA: Diagnosis present

## 2024-01-27 DIAGNOSIS — R0689 Other abnormalities of breathing: Secondary | ICD-10-CM

## 2024-01-27 DIAGNOSIS — R0902 Hypoxemia: Secondary | ICD-10-CM | POA: Diagnosis not present

## 2024-01-27 DIAGNOSIS — Z79899 Other long term (current) drug therapy: Secondary | ICD-10-CM

## 2024-01-27 DIAGNOSIS — Z7189 Other specified counseling: Secondary | ICD-10-CM | POA: Diagnosis not present

## 2024-01-27 DIAGNOSIS — Z7983 Long term (current) use of bisphosphonates: Secondary | ICD-10-CM

## 2024-01-27 DIAGNOSIS — Z1152 Encounter for screening for COVID-19: Secondary | ICD-10-CM | POA: Diagnosis not present

## 2024-01-27 DIAGNOSIS — J440 Chronic obstructive pulmonary disease with acute lower respiratory infection: Secondary | ICD-10-CM | POA: Diagnosis present

## 2024-01-27 DIAGNOSIS — E785 Hyperlipidemia, unspecified: Secondary | ICD-10-CM | POA: Diagnosis present

## 2024-01-27 DIAGNOSIS — R012 Other cardiac sounds: Secondary | ICD-10-CM | POA: Diagnosis not present

## 2024-01-27 DIAGNOSIS — E440 Moderate protein-calorie malnutrition: Secondary | ICD-10-CM | POA: Diagnosis present

## 2024-01-27 DIAGNOSIS — Z515 Encounter for palliative care: Secondary | ICD-10-CM | POA: Diagnosis not present

## 2024-01-27 DIAGNOSIS — K8020 Calculus of gallbladder without cholecystitis without obstruction: Secondary | ICD-10-CM | POA: Diagnosis present

## 2024-01-27 DIAGNOSIS — Z818 Family history of other mental and behavioral disorders: Secondary | ICD-10-CM

## 2024-01-27 LAB — URINALYSIS, ROUTINE W REFLEX MICROSCOPIC
Bacteria, UA: NONE SEEN
Bilirubin Urine: NEGATIVE
Glucose, UA: NEGATIVE mg/dL
Ketones, ur: NEGATIVE mg/dL
Leukocytes,Ua: NEGATIVE
Nitrite: NEGATIVE
Protein, ur: 100 mg/dL — AB
Specific Gravity, Urine: 1.023 (ref 1.005–1.030)
pH: 5 (ref 5.0–8.0)

## 2024-01-27 LAB — COMPREHENSIVE METABOLIC PANEL WITH GFR
ALT: 16 U/L (ref 0–44)
AST: 22 U/L (ref 15–41)
Albumin: 4.4 g/dL (ref 3.5–5.0)
Alkaline Phosphatase: 83 U/L (ref 38–126)
BUN: 20 mg/dL (ref 8–23)
CO2: >45 mmol/L — ABNORMAL HIGH (ref 22–32)
Calcium: 9.4 mg/dL (ref 8.9–10.3)
Chloride: 90 mmol/L — ABNORMAL LOW (ref 98–111)
Creatinine, Ser: 0.35 mg/dL — ABNORMAL LOW (ref 0.44–1.00)
GFR, Estimated: >60 mL/min
Glucose, Bld: 170 mg/dL — ABNORMAL HIGH (ref 70–99)
Potassium: 5.1 mmol/L (ref 3.5–5.1)
Sodium: 147 mmol/L — ABNORMAL HIGH (ref 135–145)
Total Bilirubin: 0.7 mg/dL (ref 0.0–1.2)
Total Protein: 7.1 g/dL (ref 6.5–8.1)

## 2024-01-27 LAB — CBC
HCT: 35.7 % — ABNORMAL LOW (ref 36.0–46.0)
Hemoglobin: 10.3 g/dL — ABNORMAL LOW (ref 12.0–15.0)
MCH: 30.7 pg (ref 26.0–34.0)
MCHC: 28.9 g/dL — ABNORMAL LOW (ref 30.0–36.0)
MCV: 106.6 fL — ABNORMAL HIGH (ref 80.0–100.0)
Platelets: 66 K/uL — ABNORMAL LOW (ref 150–400)
RBC: 3.35 MIL/uL — ABNORMAL LOW (ref 3.87–5.11)
RDW: 11.8 % (ref 11.5–15.5)
WBC: 3.7 K/uL — ABNORMAL LOW (ref 4.0–10.5)
nRBC: 0 % (ref 0.0–0.2)

## 2024-01-27 LAB — CBC WITH DIFFERENTIAL/PLATELET
Abs Immature Granulocytes: 0.04 K/uL (ref 0.00–0.07)
Basophils Absolute: 0 K/uL (ref 0.0–0.1)
Basophils Relative: 0 %
Eosinophils Absolute: 0 K/uL (ref 0.0–0.5)
Eosinophils Relative: 0 %
HCT: 46.1 % — ABNORMAL HIGH (ref 36.0–46.0)
Hemoglobin: 12.8 g/dL (ref 12.0–15.0)
Immature Granulocytes: 1 %
Lymphocytes Relative: 4 %
Lymphs Abs: 0.4 K/uL — ABNORMAL LOW (ref 0.7–4.0)
MCH: 30.1 pg (ref 26.0–34.0)
MCHC: 27.8 g/dL — ABNORMAL LOW (ref 30.0–36.0)
MCV: 108.5 fL — ABNORMAL HIGH (ref 80.0–100.0)
Monocytes Absolute: 0.2 K/uL (ref 0.1–1.0)
Monocytes Relative: 2 %
Neutro Abs: 7.7 K/uL (ref 1.7–7.7)
Neutrophils Relative %: 93 %
Platelets: 113 K/uL — ABNORMAL LOW (ref 150–400)
RBC: 4.25 MIL/uL (ref 3.87–5.11)
RDW: 11.9 % (ref 11.5–15.5)
WBC: 8.3 K/uL (ref 4.0–10.5)
nRBC: 0 % (ref 0.0–0.2)

## 2024-01-27 LAB — BLOOD GAS, VENOUS
Drawn by: 44828
O2 Saturation: 99.8 %
Patient temperature: 31.6
pCO2, Ven: >123 mmHg (ref 44–60)
pH, Ven: 7.32 (ref 7.25–7.43)
pO2, Ven: 62 mmHg — ABNORMAL HIGH (ref 32–45)

## 2024-01-27 LAB — RESPIRATORY PANEL BY PCR

## 2024-01-27 LAB — POCT I-STAT 7, (LYTES, BLD GAS, ICA,H+H)
Acid-Base Excess: 18 mmol/L — ABNORMAL HIGH (ref 0.0–2.0)
Bicarbonate: 48.1 mmol/L — ABNORMAL HIGH (ref 20.0–28.0)
Calcium, Ion: 1.1 mmol/L — ABNORMAL LOW (ref 1.15–1.40)
HCT: 32 % — ABNORMAL LOW (ref 36.0–46.0)
Hemoglobin: 10.9 g/dL — ABNORMAL LOW (ref 12.0–15.0)
O2 Saturation: 83 %
Patient temperature: 98.7
Potassium: 4.5 mmol/L (ref 3.5–5.1)
Sodium: 145 mmol/L (ref 135–145)
TCO2: >50 mmol/L — ABNORMAL HIGH (ref 22–32)
pCO2 arterial: 96.2 mmHg (ref 32–48)
pH, Arterial: 7.307 — ABNORMAL LOW (ref 7.35–7.45)
pO2, Arterial: 56 mmHg — ABNORMAL LOW (ref 83–108)

## 2024-01-27 LAB — BASIC METABOLIC PANEL WITH GFR
Anion gap: 14 (ref 5–15)
BUN: 20 mg/dL (ref 8–23)
CO2: 35 mmol/L — ABNORMAL HIGH (ref 22–32)
Calcium: 8.5 mg/dL — ABNORMAL LOW (ref 8.9–10.3)
Chloride: 96 mmol/L — ABNORMAL LOW (ref 98–111)
Creatinine, Ser: 0.51 mg/dL (ref 0.44–1.00)
GFR, Estimated: >60 mL/min
Glucose, Bld: 111 mg/dL — ABNORMAL HIGH (ref 70–99)
Potassium: 3.8 mmol/L (ref 3.5–5.1)
Sodium: 145 mmol/L (ref 135–145)

## 2024-01-27 LAB — RESP PANEL BY RT-PCR (RSV, FLU A&B, COVID)  RVPGX2
Influenza A by PCR: NEGATIVE
Influenza B by PCR: NEGATIVE
Resp Syncytial Virus by PCR: NEGATIVE
SARS Coronavirus 2 by RT PCR: NEGATIVE

## 2024-01-27 LAB — TROPONIN T, HIGH SENSITIVITY
Troponin T High Sensitivity: <6 ng/L (ref 0–19)
Troponin T High Sensitivity: 6 ng/L (ref 0–19)

## 2024-01-27 LAB — GLUCOSE, CAPILLARY
Glucose-Capillary: 136 mg/dL — ABNORMAL HIGH (ref 70–99)
Glucose-Capillary: 105 mg/dL — ABNORMAL HIGH (ref 70–99)
Glucose-Capillary: 95 mg/dL (ref 70–99)

## 2024-01-27 LAB — URINE DRUG SCREEN
Amphetamines: NEGATIVE
Barbiturates: NEGATIVE
Benzodiazepines: POSITIVE — AB
Cocaine: NEGATIVE
Fentanyl: NEGATIVE
Methadone Scn, Ur: NEGATIVE
Opiates: NEGATIVE
Tetrahydrocannabinol: NEGATIVE

## 2024-01-27 LAB — AMMONIA: Ammonia: 32 umol/L (ref 9–35)

## 2024-01-27 LAB — CBG MONITORING, ED: Glucose-Capillary: 158 mg/dL — ABNORMAL HIGH (ref 70–99)

## 2024-01-27 LAB — TSH: TSH: 1.44 u[IU]/mL (ref 0.350–4.500)

## 2024-01-27 LAB — ETHANOL: Alcohol, Ethyl (B): <15 mg/dL

## 2024-01-27 LAB — MRSA NEXT GEN BY PCR, NASAL: MRSA by PCR Next Gen: NOT DETECTED

## 2024-01-27 LAB — CREATININE, SERUM
Creatinine, Ser: 0.37 mg/dL — ABNORMAL LOW (ref 0.44–1.00)
GFR, Estimated: >60 mL/min

## 2024-01-27 LAB — LACTIC ACID, PLASMA: Lactic Acid, Venous: 0.6 mmol/L (ref 0.5–1.9)

## 2024-01-27 LAB — MAGNESIUM: Magnesium: 2.4 mg/dL (ref 1.7–2.4)

## 2024-01-27 MED ORDER — ARFORMOTEROL TARTRATE 15 MCG/2ML IN NEBU
15.0000 ug | INHALATION_SOLUTION | Freq: Two times a day (BID) | RESPIRATORY_TRACT | Status: DC
  Administered 2024-01-27 – 2024-02-04 (×15): 15 ug via RESPIRATORY_TRACT
  Filled 2024-01-27 (×15): qty 2

## 2024-01-27 MED ORDER — FENTANYL CITRATE (PF) 50 MCG/ML IJ SOSY: 50.0000 ug | PREFILLED_SYRINGE | INTRAMUSCULAR | Status: DC | PRN

## 2024-01-27 MED ORDER — ORAL CARE MOUTH RINSE
15.0000 mL | OROMUCOSAL | Status: DC
  Administered 2024-01-27 – 2024-01-28 (×10): 15 mL via OROMUCOSAL

## 2024-01-27 MED ORDER — SUCCINYLCHOLINE CHLORIDE 200 MG/10ML IV SOSY
PREFILLED_SYRINGE | INTRAVENOUS | Status: AC
  Administered 2024-01-27: 100 mg via INTRAVENOUS
  Filled 2024-01-27: qty 10

## 2024-01-27 MED ORDER — MAGNESIUM SULFATE 2 GM/50ML IV SOLN
2.0000 g | Freq: Once | INTRAVENOUS | Status: AC
  Administered 2024-01-27: 2 g via INTRAVENOUS
  Filled 2024-01-27: qty 50

## 2024-01-27 MED ORDER — SUCCINYLCHOLINE CHLORIDE 200 MG/10ML IV SOSY: 100.0000 mg | PREFILLED_SYRINGE | Freq: Once | INTRAVENOUS | Status: AC

## 2024-01-27 MED ORDER — IPRATROPIUM-ALBUTEROL 0.5-2.5 (3) MG/3ML IN SOLN
3.0000 mL | Freq: Once | RESPIRATORY_TRACT | Status: AC
  Administered 2024-01-27: 3 mL via RESPIRATORY_TRACT
  Filled 2024-01-27: qty 3

## 2024-01-27 MED ORDER — REVEFENACIN 175 MCG/3ML IN SOLN
175.0000 ug | Freq: Every day | RESPIRATORY_TRACT | Status: DC
  Administered 2024-01-28 – 2024-02-04 (×7): 175 ug via RESPIRATORY_TRACT
  Filled 2024-01-27 (×7): qty 3

## 2024-01-27 MED ORDER — MIDAZOLAM HCL (PF) 2 MG/2ML IJ SOLN: 1.0000 mg | INTRAMUSCULAR | Status: DC | PRN

## 2024-01-27 MED ORDER — FENTANYL CITRATE (PF) 100 MCG/2ML IJ SOLN
100.0000 ug | Freq: Once | INTRAMUSCULAR | Status: DC
  Filled 2024-01-27: qty 2

## 2024-01-27 MED ORDER — SODIUM CHLORIDE 0.9 % IV SOLN
100.0000 mg | Freq: Two times a day (BID) | INTRAVENOUS | Status: DC
  Administered 2024-01-27 – 2024-01-28 (×2): 100 mg via INTRAVENOUS
  Filled 2024-01-27 (×3): qty 100

## 2024-01-27 MED ORDER — SODIUM CHLORIDE 0.9 % IV BOLUS
1000.0000 mL | Freq: Once | INTRAVENOUS | Status: AC
  Administered 2024-01-27: 1000 mL via INTRAVENOUS

## 2024-01-27 MED ORDER — LACTATED RINGERS IV SOLN: INTRAVENOUS | Status: DC

## 2024-01-27 MED ORDER — POLYETHYLENE GLYCOL 3350 17 G PO PACK
17.0000 g | PACK | Freq: Every day | ORAL | Status: DC
  Administered 2024-01-27 – 2024-01-28 (×2): 17 g
  Filled 2024-01-27 (×2): qty 1

## 2024-01-27 MED ORDER — SENNA 8.6 MG PO TABS: 1.0000 | ORAL_TABLET | Freq: Two times a day (BID) | ORAL | Status: DC

## 2024-01-27 MED ORDER — POLYETHYLENE GLYCOL 3350 17 G PO PACK: 17.0000 g | PACK | Freq: Every day | ORAL | Status: DC

## 2024-01-27 MED ORDER — POLYETHYLENE GLYCOL 3350 17 G PO PACK: 17.0000 g | PACK | Freq: Every day | ORAL | Status: DC | PRN

## 2024-01-27 MED ORDER — SENNA 8.6 MG PO TABS
1.0000 | ORAL_TABLET | Freq: Two times a day (BID) | ORAL | Status: DC
  Administered 2024-01-27 – 2024-01-28 (×3): 8.6 mg
  Filled 2024-01-27 (×3): qty 1

## 2024-01-27 MED ORDER — PROPOFOL 1000 MG/100ML IV EMUL
0.0000 ug/kg/min | INTRAVENOUS | Status: DC
  Administered 2024-01-27: 5 ug/kg/min via INTRAVENOUS
  Filled 2024-01-27: qty 100

## 2024-01-27 MED ORDER — ALBUTEROL (5 MG/ML) CONTINUOUS INHALATION SOLN
10.0000 mg/h | INHALATION_SOLUTION | RESPIRATORY_TRACT | Status: DC
  Administered 2024-01-27: 10 mg/h via RESPIRATORY_TRACT
  Filled 2024-01-27 (×2): qty 20

## 2024-01-27 MED ORDER — BUDESONIDE 0.5 MG/2ML IN SUSP
0.5000 mg | Freq: Two times a day (BID) | RESPIRATORY_TRACT | Status: DC
  Administered 2024-01-27 – 2024-02-04 (×15): 0.5 mg via RESPIRATORY_TRACT
  Filled 2024-01-27 (×15): qty 2

## 2024-01-27 MED ORDER — IPRATROPIUM-ALBUTEROL 0.5-2.5 (3) MG/3ML IN SOLN
3.0000 mL | RESPIRATORY_TRACT | Status: DC | PRN
  Administered 2024-01-30 – 2024-02-03 (×4): 3 mL via RESPIRATORY_TRACT
  Filled 2024-01-27 (×4): qty 3

## 2024-01-27 MED ORDER — ACETAMINOPHEN 325 MG PO TABS
650.0000 mg | ORAL_TABLET | ORAL | Status: DC | PRN
  Administered 2024-01-27: 650 mg
  Filled 2024-01-27: qty 2

## 2024-01-27 MED ORDER — FENTANYL CITRATE (PF) 50 MCG/ML IJ SOSY
25.0000 ug | PREFILLED_SYRINGE | Freq: Once | INTRAMUSCULAR | Status: AC
  Administered 2024-01-27: 50 ug via INTRAVENOUS
  Filled 2024-01-27: qty 1

## 2024-01-27 MED ORDER — ACETAZOLAMIDE SODIUM 500 MG IJ SOLR
500.0000 mg | Freq: Two times a day (BID) | INTRAMUSCULAR | Status: AC
  Administered 2024-01-27 – 2024-01-28 (×2): 500 mg via INTRAVENOUS
  Filled 2024-01-27 (×2): qty 500

## 2024-01-27 MED ORDER — PROPOFOL 1000 MG/100ML IV EMUL
0.0000 ug/kg/min | INTRAVENOUS | Status: DC
  Administered 2024-01-27: 35 ug/kg/min via INTRAVENOUS
  Administered 2024-01-28: 45 ug/kg/min via INTRAVENOUS
  Filled 2024-01-27 (×3): qty 100

## 2024-01-27 MED ORDER — ORAL CARE MOUTH RINSE: 15.0000 mL | OROMUCOSAL | Status: DC | PRN

## 2024-01-27 MED ORDER — SENNA 8.6 MG PO TABS: 1.0000 | ORAL_TABLET | Freq: Two times a day (BID) | ORAL | Status: DC | PRN

## 2024-01-27 MED ORDER — ONDANSETRON HCL 4 MG/2ML IJ SOLN: 4.0000 mg | Freq: Four times a day (QID) | INTRAMUSCULAR | Status: DC | PRN

## 2024-01-27 MED ORDER — LEVETIRACETAM (KEPPRA) 500 MG/5 ML ADULT IV PUSH
1000.0000 mg | Freq: Once | INTRAVENOUS | Status: AC
  Administered 2024-01-27: 1000 mg via INTRAVENOUS
  Filled 2024-01-27: qty 10

## 2024-01-27 MED ORDER — FENTANYL 2500MCG IN NS 250ML (10MCG/ML) PREMIX INFUSION
0.0000 ug/h | INTRAVENOUS | Status: DC
  Administered 2024-01-27: 25 ug/h via INTRAVENOUS
  Filled 2024-01-27: qty 250

## 2024-01-27 MED ORDER — HEPARIN SODIUM (PORCINE) 5000 UNIT/ML IJ SOLN
5000.0000 [IU] | Freq: Three times a day (TID) | INTRAMUSCULAR | Status: DC
  Administered 2024-01-27 – 2024-02-04 (×23): 5000 [IU] via SUBCUTANEOUS
  Filled 2024-01-27 (×23): qty 1

## 2024-01-27 MED ORDER — ETOMIDATE 2 MG/ML IV SOLN
INTRAVENOUS | Status: AC
  Administered 2024-01-27: 20 mg via INTRAVENOUS
  Filled 2024-01-27: qty 10

## 2024-01-27 MED ORDER — ROCURONIUM BROMIDE 10 MG/ML (PF) SYRINGE
100.0000 mg | PREFILLED_SYRINGE | Freq: Once | INTRAVENOUS | Status: AC
  Administered 2024-01-27: 100 mg via INTRAVENOUS
  Filled 2024-01-27: qty 10

## 2024-01-27 MED ORDER — METHYLPREDNISOLONE SODIUM SUCC 40 MG IJ SOLR
40.0000 mg | Freq: Two times a day (BID) | INTRAMUSCULAR | Status: DC
  Administered 2024-01-27 – 2024-01-29 (×4): 40 mg via INTRAVENOUS
  Filled 2024-01-27 (×5): qty 1

## 2024-01-27 MED ORDER — SODIUM CHLORIDE 0.9 % IV SOLN
2.0000 g | INTRAVENOUS | Status: AC
  Administered 2024-01-27 – 2024-02-02 (×7): 2 g via INTRAVENOUS
  Filled 2024-01-27 (×7): qty 20

## 2024-01-27 MED ORDER — ETOMIDATE 2 MG/ML IV SOLN: 20.0000 mg | Freq: Once | INTRAVENOUS | Status: AC

## 2024-01-27 MED ORDER — IOHEXOL 300 MG/ML  SOLN
100.0000 mL | Freq: Once | INTRAMUSCULAR | Status: AC | PRN
  Administered 2024-01-27: 100 mL via INTRAVENOUS

## 2024-01-27 MED ORDER — METHYLPREDNISOLONE SODIUM SUCC 125 MG IJ SOLR
125.0000 mg | Freq: Once | INTRAMUSCULAR | Status: AC
  Administered 2024-01-27: 125 mg via INTRAVENOUS
  Filled 2024-01-27: qty 2

## 2024-01-27 MED ORDER — CHLORHEXIDINE GLUCONATE CLOTH 2 % EX PADS
6.0000 | MEDICATED_PAD | Freq: Every day | CUTANEOUS | Status: DC
  Administered 2024-01-27 – 2024-01-31 (×3): 6 via TOPICAL

## 2024-01-27 MED ORDER — PANTOPRAZOLE SODIUM 40 MG IV SOLR
40.0000 mg | Freq: Every day | INTRAVENOUS | Status: DC
  Administered 2024-01-27: 40 mg via INTRAVENOUS
  Filled 2024-01-27: qty 10

## 2024-01-27 MED ORDER — FENTANYL BOLUS VIA INFUSION
25.0000 ug | INTRAVENOUS | Status: DC | PRN
  Administered 2024-01-27 – 2024-01-28 (×6): 50 ug via INTRAVENOUS

## 2024-01-27 NOTE — H&P (Signed)
 "  NAME:  Donna Watkins, MRN:  [id number], DOB:  19-May-1960, LOS: 0 ADMISSION DATE:  01/27/2024, CONSULTATION DATE:  01/27/24 REFERRING MD:  Towana , CHIEF COMPLAINT:  AMS  History of Present Illness:   64 yo F PMH copd, chronic hypoxia on 4L, anxiety,chronic sacral wound, hx SCC lung s.p radiation,  who presented to [facility] ED 01/27/24 via EMS for AMS. Apparently family tried to awaken patient and reported sz like activity. She was hypoxic sats in 70s on her home 4L with EMS. EtCO2 was also high w EMS. In ED she was short of breath and altered. She was reportedly following commands. PCCM was called for transfer to [facility] & accepted in this setting  Prior to transfer, the patient apparently had a seizure vs sz like activity in CT & was intubated. It looks like she was given 1g keppra     Pertinent  Medical History  COPD Chronic hypoxic and hypercarbic respiratory failure  SCC lung s/p XRT  Significant Hospital Events: Including procedures, antibiotic start and stop dates in addition to other pertinent events   1/22 ?sz like activity at [facility]. Intubated. Txf to [facility]   Interim History / Subjective:  Arrives to [facility] intubated   Objective    Blood pressure 119/85, pulse 87, temperature (!) 93 F (33.9 C), resp. rate 16, height 5' 3 (1.6 m), weight 46.3 kg, last menstrual period 05/06/2010, SpO2 98%.    Vent Mode: PRVC FiO2 (%):  [50 %] 50 % Set Rate:  [16 bmp] 16 bmp Vt Set:  [420 mL] 420 mL PEEP:  [5 cmH20] 5 cmH20 Plateau Pressure:  [22 cmH20] 22 cmH20  No intake or output data in the 24 hours ending 01/27/24 1448 Filed Weights   01/27/24 1024  Weight: 46.3 kg    Examination: General: chronically and critically ill adult F HENT: NCAT pink mm ETT secure  Lungs: mechanically ventilated. Thin frothy respiratory secretions. Barely any air movement  Cardiovascular: rr Abdomen: thin soft ndnt  Extremities: no acute joint deformity  Neuro: lightly sedated. Moving BUE spontaneously but dfoes  not follow commands  GU: foley   Resolved problem list   Assessment and Plan   Acute encephalopathy Seizure vs seizure like activity Chiari 1 malformation, stable  -AoC hypoxia and hypercarbia (pco2 > 125) -- query sz-like movments 2/2 hypercarbia  -did get 1g keppra  at [facility]  P -d/w neuro -- plan for LTM  -no add'l AED for now -prop, fent gtt -- will deepen sedation  -vent as below   AoC resp failure w hypoxia and hypercarbia AECOPD  -end stage COPD follows w dr darlean. On 4L at baseline  -pH is normal; serum bicarb is >45  P -cont MV support -CXR (she is moving such poor air we need to make sure this hasn't moved in transit) and ABG  -RVP, covid, flu, trach asp -solumedrol -2g mag -rocephin  doxy (shortage of azithro) -triple therapy + PRN duoneb  -acetazolamide  -- will do 500 BID x 2 d   Dehydration Hypernatremia  -concentrated on labs vs prior P -mIVF -AM BMP CBC    Labs   CBC: Recent Labs  Lab 01/27/24 1014  WBC 8.3  NEUTROABS 7.7  HGB 12.8  HCT 46.1*  MCV 108.5*  PLT 113*    Basic Metabolic Panel: Recent Labs  Lab 01/27/24 1014  NA 147*  K 5.1  CL 90*  CO2 >45*  GLUCOSE 170*  BUN 20  CREATININE 0.35*  CALCIUM  9.4  GFR: Estimated Creatinine Clearance: 53.3 mL/min (A) (by C-G formula based on SCr of 0.35 mg/dL (L)). Recent Labs  Lab 01/27/24 1014 01/27/24 1015  WBC 8.3  --   LATICACIDVEN  --  0.6    Liver Function Tests: Recent Labs  Lab 01/27/24 1014  AST 22  ALT 16  ALKPHOS 83  BILITOT 0.7  PROT 7.1  ALBUMIN 4.4   No results for input(s): LIPASE, AMYLASE in the last 168 hours. Recent Labs  Lab 01/27/24 1019  AMMONIA 32    ABG    Component Value Date/Time   PHART 7.41 02/09/2022 1242   PCO2ART 93 (HH) 02/09/2022 1242   PO2ART 70 (L) 02/09/2022 1242   HCO3 NOT CALCULATED 01/27/2024 1052   TCO2 28.9 04/27/2012 1422   O2SAT 99.8 01/27/2024 1052     Coagulation Profile: No results for input(s): INR, PROTIME  in the last 168 hours.  Cardiac Enzymes: No results for input(s): CKTOTAL, CKMB, CKMBINDEX, TROPONINI in the last 168 hours.  HbA1C: Hgb A1c MFr Bld  Date/Time Value Ref Range Status  05/12/2022 03:13 PM 5.4 4.8 - 5.6 % Final    Comment:             Prediabetes: 5.7 - 6.4          Diabetes: >6.4          Glycemic control for adults with diabetes: <7.0     CBG: Recent Labs  Lab 01/27/24 1046  GLUCAP 158*    Review of Systems:   Unable to obtain intubated sedated   Past Medical History:  She,  has a past medical history of Anxiety, Cancer (HCC), COPD (chronic obstructive pulmonary disease) (HCC) (03/01/2012), Depression, Emphysema of lung (HCC), GERD (gastroesophageal reflux disease), History of radiation therapy, History of radiation therapy, Hyperlipidemia, Mild scoliosis, Oxygen deficiency, and Oxygen dependent.   Surgical History:   Past Surgical History:  Procedure Laterality Date   BREAST BIOPSY Left 06/25/2022   MM LT BREAST BX W LOC DEV 1ST LESION IMAGE BX SPEC STEREO GUIDE 06/25/2022 [facility]   BRONCHIAL BIOPSY  11/04/2020   Procedure: BRONCHIAL BIOPSIES;  Surgeon: Shelah Lamar RAMAN, MD;  Location: [facility] ENDOSCOPY;  Service: Pulmonary;;   BRONCHIAL BRUSHINGS  11/04/2020   Procedure: BRONCHIAL BRUSHINGS;  Surgeon: Shelah Lamar RAMAN, MD;  Location: [facility] ENDOSCOPY;  Service: Pulmonary;;   BRONCHIAL NEEDLE ASPIRATION BIOPSY  11/04/2020   Procedure: BRONCHIAL NEEDLE ASPIRATION BIOPSIES;  Surgeon: Shelah Lamar RAMAN, MD;  Location: [facility] ENDOSCOPY;  Service: Pulmonary;;   COLONOSCOPY N/A 09/09/2012   Procedure: COLONOSCOPY;  Surgeon: Margo LITTIE Haddock, MD;  Location: [facility] ENDO SU[facility];  Service: Endoscopy;  Laterality: N/A;  1:15-moved to 12:45 Melanie notified pt   COLONOSCOPY WITH PROPOFOL  N/A 11/09/2022   Procedure: COLONOSCOPY WITH PROPOFOL ;  Surgeon: Cindie Carlin POUR, DO;  Location: [facility];  Service: Endoscopy;  Laterality: N/A;  845am, asa 3   FIDUCIAL MARKER  PLACEMENT  11/04/2020   Procedure: FIDUCIAL MARKER PLACEMENT;  Surgeon: Shelah Lamar RAMAN, MD;  Location: [facility] ENDOSCOPY;  Service: Pulmonary;;   POLYPECTOMY  11/09/2022   Procedure: POLYPECTOMY;  Surgeon: Cindie Carlin POUR, DO;  Location: [facility];  Service: Endoscopy;;   TUBAL LIGATION     VIDEO BRONCHOSCOPY WITH ENDOBRONCHIAL NAVIGATION N/A 11/04/2020   Procedure: ROBOTIC  VIDEO BRONCHOSCOPY WITH ENDOBRONCHIAL NAVIGATION;  Surgeon: Shelah Lamar RAMAN, MD;  Location: [facility] ENDOSCOPY;  Service: Pulmonary;  Laterality: N/A;     Social History:   reports that she  quit smoking about 12 years ago. Her smoking use included cigarettes. She started smoking about 42 years ago. She has a 75 pack-year smoking history. She has never used smokeless tobacco. She reports that she does not drink alcohol and does not use drugs.   Family History:  Her family history includes Anxiety disorder in her mother; Arthritis in her sister; COPD in her mother; Heart disease in her father. There is no history of Colon cancer.   Allergies Allergies[1]   Home Medications  Prior to Admission medications  Medication Sig Start Date End Date Taking? Authorizing Provider  albuterol  (VENTOLIN  HFA) 108 (90 Base) MCG/ACT inhaler Inhale 2 puffs into the lungs every 4 (four) hours as needed for wheezing or shortness of breath. 11/27/21   Johnson, Clanford L, MD  alendronate (FOSAMAX) 70 MG tablet Take 70 mg by mouth once a week. 09/10/22   [provider]  ALPRAZolam  (XANAX ) 1 MG tablet Take 1 tablet (1 mg total) by mouth 3 (three) times daily as needed for anxiety. 11/27/21   Johnson, Clanford L, MD  atorvastatin  (LIPITOR) 20 MG tablet Take 1 tablet (20 mg total) by mouth daily. Restart on 04/03/22 04/03/22   Evonnie Lenis, MD  ipratropium-albuterol  (DUONEB) 0.5-2.5 (3) MG/3ML SOLN Take 3 mLs by nebulization 3 (three) times daily. And as needed for SOB 11/27/21   Johnson, Clanford L, MD  Multiple Vitamins-Minerals (CENTRUM SILVER  PO) Take 1 tablet by mouth daily.    [provider]  mupirocin ointment (BACTROBAN) 2 % Apply 1 Application topically. 07/01/22   [provider]  omeprazole (PRILOSEC) 20 MG capsule Take 20 mg by mouth daily.    [provider]  OXYGEN Place 3 L into the nose continuous.    [provider]     Critical care time: 40 min       CRITICAL CARE Performed by: Ronnald FORBES Gave   Total critical care time: 40 minutes  Critical care time was exclusive of separately billable procedures and treating other patients. Critical care was necessary to treat or prevent imminent or life-threatening deterioration.  Critical care was time spent personally by me on the following activities: development of treatment plan with patient and/or surrogate as well as nursing, discussions with consultants, evaluation of patient's response to treatment, examination of patient, obtaining history from patient or surrogate, ordering and performing treatments and interventions, ordering and review of laboratory studies, ordering and review of radiographic studies, pulse oximetry and re-evaluation of patient's condition.  Ronnald Gave MSN, AGACNP-BC [facility] Amion for pager  01/27/2024, 3:28 PM         [1] No Known Allergies  "

## 2024-01-27 NOTE — ED Notes (Signed)
 Pt intubated . 21@ the lip

## 2024-01-27 NOTE — Consult Note (Signed)
 Patient NEUROLOGY CONSULT NOTE   Date of service: January 27, 2024 Patient Name: Donna Watkins MRN:  [id number] DOB:  12-09-1960 Chief Complaint: Seizure activity Requesting Provider: Harold Scholz, MD  History of Present Illness  AYLAH YEARY is a 64 y.o. female with hx of end-stage COPD, hyperlipidemia, lung cancer, anxiety and depression who presented to [facility] after being found confused by her sister with possible seizure activity this morning.  No description was given of the possible seizure activity.  She was taken to the emergency department, where she had another episode of seizure-like activity while in the CT scanner and was intubated.  ROS   Unable to ascertain due to patient being intubated and sedated  Past History   Past Medical History:  Diagnosis Date   Anxiety    panic attacks   Cancer (HCC)    COPD (chronic obstructive pulmonary disease) (HCC) 03/01/2012   Depression    Emphysema of lung (HCC)    GERD (gastroesophageal reflux disease)    History of radiation therapy    Left Lung- 12/09/20-12/19/20- Dr. Lynwood Nasuti   History of radiation therapy    Left lung- 05/19/22-05/26/22- Dr. Lynwood Nasuti   Hyperlipidemia    Mild scoliosis    Oxygen deficiency    Oxygen dependent     Past Surgical History:  Procedure Laterality Date   BREAST BIOPSY Left 06/25/2022   MM LT BREAST BX W LOC DEV 1ST LESION IMAGE BX SPEC STEREO GUIDE 06/25/2022 [facility]AMMOGR[facility]   BRONCHIAL BIOPSY  11/04/2020   Procedure: BRONCHIAL BIOPSIES;  Surgeon: Shelah Lamar RAMAN, MD;  Location: [facility] ENDOSCOPY;  Service: Pulmonary;;   BRONCHIAL BRUSHINGS  11/04/2020   Procedure: BRONCHIAL BRUSHINGS;  Surgeon: Shelah Lamar RAMAN, MD;  Location: [facility] ENDOSCOPY;  Service: Pulmonary;;   BRONCHIAL NEEDLE ASPIRATION BIOPSY  11/04/2020   Procedure: BRONCHIAL NEEDLE ASPIRATION BIOPSIES;  Surgeon: Shelah Lamar RAMAN, MD;  Location: [facility] ENDOSCOPY;  Service: Pulmonary;;   COLONOSCOPY N/A 09/09/2012    Procedure: COLONOSCOPY;  Surgeon: Margo LITTIE Haddock, MD;  [facility];  Service: Endoscopy;  Laterality: N/A;  1:15-moved to 12:45 Melanie notified pt   COLONOSCOPY WITH PROPOFOL  N/A 11/09/2022   Procedure: COLONOSCOPY WITH PROPOFOL ;  Surgeon: Cindie Carlin POUR, DO;  Location: [facility];  Service: Endoscopy;  Laterality: N/A;  845am, asa 3   FIDUCIAL MARKER PLACEMENT  11/04/2020   Procedure: FIDUCIAL MARKER PLACEMENT;  Surgeon: Shelah Lamar RAMAN, MD;  Location: [facility] ENDOSCOPY;  Service: Pulmonary;;   POLYPECTOMY  11/09/2022   Procedure: POLYPECTOMY;  Surgeon: Cindie Carlin POUR, DO;  Location: [facility];  Service: Endoscopy;;   TUBAL LIGATION     VIDEO BRONCHOSCOPY WITH ENDOBRONCHIAL NAVIGATION N/A 11/04/2020   Procedure: ROBOTIC  VIDEO BRONCHOSCOPY WITH ENDOBRONCHIAL NAVIGATION;  Surgeon: Shelah Lamar RAMAN, MD;  Location: [facility] ENDOSCOPY;  Service: Pulmonary;  Laterality: N/A;    Family History: Family History  Problem Relation Age of Onset   Heart disease Father    COPD Mother    Anxiety disorder Mother    Arthritis Sister    Colon cancer Neg Hx     Social History  reports that she quit smoking about 12 years ago. Her smoking use included cigarettes. She started smoking about 42 years ago. She has a 75 pack-year smoking history. She has never used smokeless tobacco. She reports that she does not drink alcohol and does not use drugs.  Allergies[1]  Medications  Current Medications[2]  Vitals   Vitals:  2024/02/12 1325 02-12-2024 1330 02-12-24 1457 February 12, 2024 1500  BP: 129/81 119/85  90/71  Pulse: 85 87  78  Resp: 16 16  18   Temp: (!) 92.9 F (33.8 C) (!) 93 F (33.9 C)  (!) 93.9 F (34.4 C)  TempSrc:   Bladder Bladder  SpO2: 97% 98%  98%  Weight:   47.1 kg   Height:        Body mass index is 18.39 kg/m.   Physical Exam   Constitutional: Chronically ill-appearing intubated patient in no acute distress Eyes: No scleral injection.  HENT: Endotracheal tube in  place Head: Normocephalic.  Cardiovascular: Normal rate and regular rhythm.  Respiratory: Respirations synchronous with ventilator Skin: WDI.   Neurologic Examination   (On sedation with moderate dose propofol , could not be paused as sedation was necessary for effective ventilation) pupils equal round and reactive, oculocephalic reflex absent but corneal reflexes present, patient does not respond to name or follow commands but does grimace to sternal rub, no response to proximal pinch in all 4 extremities  Labs/Imaging/Neurodiagnostic studies   CBC:  Recent Labs  Lab Feb 12, 2024 1014 February 12, 2024 1454 Feb 12, 2024 1540  WBC 8.3 3.7*  --   NEUTROABS 7.7  --   --   HGB 12.8 10.3* 10.9*  HCT 46.1* 35.7* 32.0*  MCV 108.5* 106.6*  --   PLT 113* PENDING  --    Basic Metabolic Panel:  Lab Results  Component Value Date   NA 145 02-12-2024   K 4.5 02/12/24   CO2 >45 (H) 02/12/2024   GLUCOSE 170 (H) February 12, 2024   BUN 20 February 12, 2024   CREATININE 0.37 (L) 02/12/2024   CALCIUM  9.4 02/12/2024   GFRNONAA >60 2024/02/12   GFRAA >90 06/15/2011   Lipid Panel:  Lab Results  Component Value Date   LDLCALC 82 05/12/2022   HgbA1c:  Lab Results  Component Value Date   HGBA1C 5.4 05/12/2022   Urine Drug Screen:     Component Value Date/Time   LABOPIA NEGATIVE February 12, 2024 1029   COCAINSCRNUR NEGATIVE 02/12/24 1029   LABBENZ POSITIVE (A) Feb 12, 2024 1029   AMPHETMU NEGATIVE 02-12-2024 1029   THCU NEGATIVE 2024-02-12 1029   LABBARB NEGATIVE 02-12-24 1029    Alcohol Level     Component Value Date/Time   ETH <15 02/12/2024 1015   INR  Lab Results  Component Value Date   INR 0.9 11/04/2020   APTT  Lab Results  Component Value Date   APTT 23 (L) 11/26/2021  ABG    Component Value Date/Time   PHART 7.307 (L) 02/12/2024 1540   PCO2ART 96.2 (HH) 2024/02/12 1540   PO2ART 56 (L) 2024-02-12 1540   HCO3 48.1 (H) February 12, 2024 1540   TCO2 >50 (H) 02-12-2024 1540   O2SAT 83 02-12-2024  1540    CT Head without contrast(Personally reviewed): No acute abnormality  MRI Brain with and without contrast (Personally reviewed): Pending  Neurodiagnostics Continuous EEG:  Pending  ASSESSMENT   CARIME DINKEL is a 63 y.o. female with hx of end-stage COPD, hyperlipidemia, lung cancer, anxiety and depression who presents with altered mental status and possible seizure activity.  She initially presented to the emergency department at [facility] and had an episode of seizure-like activity while in the CT scanner.  Unfortunately, no description of this episode is available.  She was intubated after the episode has been noted to be quite hypercarbic.  She was given 1 g of Keppra  while in [facility] and has been on propofol  for  sedation since arriving here.  Deep sedation has been necessary to maintain effective ventilation.  Given history of lung cancer and new onset of seizure-like activity, will obtain brain MRI with and without contrast.  Will place patient on LTM EEG and observe for further seizure-like activity.  Will hold off on AEDs for now and await results of LTM EEG.  RECOMMENDATIONS  -LTM EEG - MRI brain with and without contrast - Will hold off on AEDs until EEG results are seen - Neurology will continue to follow ______________________________________________________________________  Patient seen by NP and then by MD, MD to edit note as needed.  Signed, Cortney E Everitt Clint Kill, NP [facility]   Attending Neurohospitalist Addendum Patient seen and examined with APP/Resident. Agree with the history and physical as documented above. Agree with the plan as documented, which I helped formulate. I have independently reviewed the chart, obtained history, review of systems and examined the patient.I have personally reviewed pertinent head/neck/spine imaging (CT/MRI).  Seizure-like activity-clear description not available Multiple other confounders that would  cause seizure-like activity versus shivering versus other adventitious movements.  Not a very good neurological exam because of the patient requiring sedation for respiratory reasons. Will hook up to LTM and continue to follow.  No need for AEDs for now until either EEG or clinical evidence of seizure. Plan was discussed with Dr. Harold and Ronnald Gave from PCCM  Please feel free to call with any questions.  -- Eligio Lav, MD Neurologist [facility] Pager: [phone]  CRITICAL CARE ATTESTATION Performed by: Eligio Lav, MD Total critical care time: 31 minutes Critical care time was exclusive of separately billable procedures and treating other patients and/or supervising APPs/Residents/Students Critical care was necessary to treat or prevent imminent or life-threatening deterioration. This patient is critically ill and at significant risk for neurological worsening and/or death and care requires constant monitoring. Critical care was time spent personally by me on the following activities: development of treatment plan with patient and/or surrogate as well as nursing, discussions with consultants, evaluation of patient's response to treatment, examination of patient, obtaining history from patient or surrogate, ordering and performing treatments and interventions, ordering and review of laboratory studies, ordering and review of radiographic studies, pulse oximetry, re-evaluation of patient's condition, participation in multidisciplinary rounds and medical decision making of high complexity in the care of this patient.     [1] No Known Allergies [2]  Current Facility-Administered Medications:    acetaminophen  (TYLENOL ) tablet 650 mg, 650 mg, Per Tube, Q4H PRN, Bowser, Ronnald BRAVO, NP   acetaZOLAMIDE  (DIAMOX ) injection 500 mg, 500 mg, Intravenous, Q12H, Bowser, Grace E, NP   albuterol  (PROVENTIL ,VENTOLIN ) solution continuous neb, 10 mg/hr, Nebulization, Continuous, Chand,  Sudham, MD   arformoterol  (BROVANA ) nebulizer solution 15 mcg, 15 mcg, Nebulization, BID, Bowser, Grace E, NP   budesonide  (PULMICORT ) nebulizer solution 0.5 mg, 0.5 mg, Nebulization, BID, Bowser, Grace E, NP   cefTRIAXone  (ROCEPHIN ) 2 g in sodium chloride  0.9 % 100 mL IVPB, 2 g, Intravenous, Q24H, Paytes, Emma U, RPH   Chlorhexidine  Gluconate Cloth 2 % PADS 6 each, 6 each, Topical, Daily, Bowser, Ronnald BRAVO, NP, 6 each at 01/27/24 1500   doxycycline  (VIBRAMYCIN ) 100 mg in sodium chloride  0.9 % 250 mL IVPB, 100 mg, Intravenous, Q12H, Bowser, Grace E, NP   fentaNYL  (SUBLIMAZE ) bolus via infusion 25-100 mcg, 25-100 mcg, Intravenous, Q15 min PRN, Bowser, Grace E, NP   fentaNYL  (SUBLIMAZE ) injection 25-50 mcg, 25-50 mcg, Intravenous, Once, Bowser, Ronnald BRAVO, NP  fentaNYL  in NS (57mcg/ml) infusion-PREMIX, 0-400 mcg/hr, Intravenous, Continuous, Bowser, Ronnald BRAVO, NP   heparin  injection 5,000 Units, 5,000 Units, Subcutaneous, Q8H, Bowser, Grace E, NP, 5,000 Units at 01/27/24 1531   ipratropium-albuterol  (DUONEB) 0.5-2.5 (3) MG/3ML nebulizer solution 3 mL, 3 mL, Nebulization, Q4H PRN, Bowser, Grace E, NP   lactated ringers  infusion, , Intravenous, Continuous, Bowser, Ronnald BRAVO, NP, Last Rate: 100 mL/hr at 01/27/24 1533, Rate Change at 01/27/24 1533   magnesium  sulfate IVPB 2 g 50 mL, 2 g, Intravenous, Once, Bowser, Ronnald BRAVO, NP   methylPREDNISolone  sodium succinate (SOLU-MEDROL ) 40 mg/mL injection 40 mg, 40 mg, Intravenous, Q12H, Bowser, Grace E, NP, 40 mg at 01/27/24 1531   midazolam  PF (VERSED ) injection 1-2 mg, 1-2 mg, Intravenous, Q1H PRN, Bowser, Ronnald BRAVO, NP   ondansetron  (ZOFRAN ) injection 4 mg, 4 mg, Intravenous, Q6H PRN, Bowser, Grace E, NP   Oral care mouth rinse, 15 mL, Mouth Rinse, Q2H, Alghanim, Fahid, MD, 15 mL at 01/27/24 1600   Oral care mouth rinse, 15 mL, Mouth Rinse, PRN, Alghanim, Fahid, MD   pantoprazole  (PROTONIX ) injection 40 mg, 40 mg, Intravenous, QHS, Bowser, Grace E, NP    polyethylene glycol (MIRALAX  / GLYCOLAX ) packet 17 g, 17 g, Per Tube, Daily PRN, Bowser, Ronnald BRAVO, NP   polyethylene glycol (MIRALAX  / GLYCOLAX ) packet 17 g, 17 g, Per Tube, Daily, Bowser, Grace E, NP, 17 g at 01/27/24 1531   propofol  (DIPRIVAN ) 1000 MG/100ML infusion, 0-80 mcg/kg/min, Intravenous, Continuous, Bowser, Grace E, NP, Last Rate: 11.11 mL/hr at 01/27/24 1532, 40 mcg/kg/min at 01/27/24 1532   revefenacin  (YUPELRI ) nebulizer solution 175 mcg, 175 mcg, Nebulization, Daily, Bowser, Grace E, NP   rocuronium  (ZEMURON ) injection 100 mg, 100 mg, Intravenous, Once, Harold Scholz, MD   senna (SENOKOT) tablet 8.6 mg, 1 tablet, Per Tube, BID PRN, Bowser, Grace E, NP   senna (SENOKOT) tablet 8.6 mg, 1 tablet, Per Tube, BID, Bowser, Grace E, NP, 8.6 mg at 01/27/24 1531

## 2024-01-27 NOTE — ED Notes (Signed)
 Patient had seizure in CT. Patient currently being bagged. EDP at bedside.

## 2024-01-27 NOTE — Progress Notes (Signed)
 MB called back and informed [facility] of  LTM  EEG restart due to unit was on WIFI and NOT on Network and causing buffering/Drag issues.

## 2024-01-27 NOTE — ED Triage Notes (Signed)
 Per EMS pt coming from home where family called for possible seizure like activity when attempting to wake patient this morning. Patient C02 reading 85 for ems. On 4L [place] at baseline. 79% on patient 4L. EDP at bedside. RT called.

## 2024-01-27 NOTE — ED Notes (Signed)
 Behr hugger placed on patient. EDP made aware of temp.

## 2024-01-27 NOTE — Progress Notes (Signed)
 MB arrived to assist QM and relieve her from shift for remainder of LTM EEG hook up.

## 2024-01-27 NOTE — ED Provider Notes (Signed)
 "  EMERGENCY DEPARTMENT AT [facility] Provider Note   CSN: [id number] Arrival date & time: 01/27/24  1005     Patient presents with: Altered Mental Status   Donna Donna Watkins is a 64 y.o. female.  She is brought in by EMS for altered mental status.  Lives with sister who found her confused this morning.  Last known well was last night.  Has a history of COPD, wears 4 L of oxygen.  EMS noted elevated pCO2.  Also reports of possible some seizure-like activity.  Patient awake and follows commands but not conversant.  Moving very slowly.  Level 5 caveat secondary to altered mental status   The history is provided by the patient and the EMS personnel.  Altered Mental Status Presenting symptoms: confusion and partial responsiveness   Most recent episode:  Today Timing:  Constant Progression:  Unchanged Chronicity:  New      Prior to Admission medications  Medication Sig Start Date End Date Taking? Authorizing Provider  albuterol  (VENTOLIN  HFA) 108 (90 Base) MCG/ACT inhaler Inhale 2 puffs into the lungs every 4 (four) hours as needed for wheezing or shortness of breath. 11/27/21   Johnson, Clanford L, MD  alendronate (FOSAMAX) 70 MG tablet Take 70 mg by mouth once a week. 09/10/22   [provider]  ALPRAZolam  (XANAX ) 1 MG tablet Take 1 tablet (1 mg total) by mouth 3 (three) times daily as needed for anxiety. 11/27/21   Johnson, Clanford L, MD  atorvastatin  (LIPITOR) 20 MG tablet Take 1 tablet (20 mg total) by mouth daily. Restart on 04/03/22 04/03/22   Evonnie Lenis, MD  ipratropium-albuterol  (DUONEB) 0.5-2.5 (3) MG/3ML SOLN Take 3 mLs by nebulization 3 (three) times daily. And as needed for SOB 11/27/21   Johnson, Clanford L, MD  Multiple Vitamins-Minerals (CENTRUM SILVER PO) Take 1 tablet by mouth daily.    [provider]  mupirocin ointment (BACTROBAN) 2 % Apply 1 Application topically. 07/01/22   [provider]  omeprazole (PRILOSEC) 20 MG  capsule Take 20 mg by mouth daily.    [provider]  OXYGEN Place 3 L into the nose continuous.    [provider]    Allergies: Patient has no known allergies.    Review of Systems  Unable to perform ROS: Mental status change  Psychiatric/Behavioral:  Positive for confusion.     Updated Vital Signs BP 131/69   Pulse 82   Resp 19   LMP 05/06/2010   SpO2 (!) 79%   Physical Exam Vitals and nursing note reviewed.  Constitutional:      General: She is not in acute distress.    Appearance: Normal appearance. She is well-developed.  HENT:     Head: Normocephalic and atraumatic.  Eyes:     Conjunctiva/sclera: Conjunctivae normal.  Cardiovascular:     Rate and Rhythm: Normal rate and regular rhythm.     Heart sounds: No murmur heard. Pulmonary:     Effort: Accessory muscle usage present. No respiratory distress.     Breath sounds: Normal breath sounds.  Abdominal:     Palpations: Abdomen is soft.     Tenderness: There is no abdominal tenderness. There is no guarding or rebound.  Musculoskeletal:        General: No swelling.     Cervical back: Neck supple.  Skin:    General: Skin is warm and dry.     Capillary Refill: Capillary refill takes less than 2 seconds.  Neurological:  General: No focal deficit present.     Mental Status: She is alert.     Motor: No weakness.     Comments: She is awake and will follow commands with all 4 extremities.  She answered a few yes/no questions but history is very limited.     (all labs ordered are listed, but only abnormal results are displayed) Labs Reviewed  COMPREHENSIVE METABOLIC PANEL WITH GFR - Abnormal; Notable for the following components:      Result Value   Sodium 147 (*)    Chloride 90 (*)    CO2 >45 (*)    Glucose, Bld 170 (*)    Creatinine, Ser 0.35 (*)    All other components within normal limits  CBC WITH DIFFERENTIAL/PLATELET - Abnormal; Notable for the following components:   HCT 46.1 (*)     MCV 108.5 (*)    MCHC 27.8 (*)    Platelets 113 (*)    Lymphs Abs 0.4 (*)    All other components within normal limits  URINALYSIS, ROUTINE W REFLEX MICROSCOPIC - Abnormal; Notable for the following components:   Hgb urine dipstick SMALL (*)    Protein, ur 100 (*)    All other components within normal limits  URINE DRUG SCREEN - Abnormal; Notable for the following components:   Benzodiazepines POSITIVE (*)    All other components within normal limits  BLOOD GAS, VENOUS - Abnormal; Notable for the following components:   pCO2, Ven >123 (*)    pO2, Ven 62 (*)    All other components within normal limits  CBC - Abnormal; Notable for the following components:   WBC 3.7 (*)    RBC 3.35 (*)    Hemoglobin 10.3 (*)    HCT 35.7 (*)    MCV 106.6 (*)    MCHC 28.9 (*)    Platelets 66 (*)    All other components within normal limits  CREATININE, SERUM - Abnormal; Notable for the following components:   Creatinine, Ser 0.37 (*)    All other components within normal limits  GLUCOSE, CAPILLARY - Abnormal; Notable for the following components:   Glucose-Capillary 136 (*)    All other components within normal limits  CBG MONITORING, ED - Abnormal; Notable for the following components:   Glucose-Capillary 158 (*)    All other components within normal limits  POCT I-STAT 7, (LYTES, BLD GAS, ICA,H+H) - Abnormal; Notable for the following components:   pH, Arterial 7.307 (*)    pCO2 arterial 96.2 (*)    pO2, Arterial 56 (*)    Bicarbonate 48.1 (*)    TCO2 >50 (*)    Acid-Base Excess 18.0 (*)    Calcium , Ion 1.10 (*)    HCT 32.0 (*)    Hemoglobin 10.9 (*)    All other components within normal limits  MRSA NEXT GEN BY PCR, NASAL  RESPIRATORY PANEL BY PCR  RESP PANEL BY RT-PCR (RSV, FLU A&B, COVID)  RVPGX2  CULTURE, RESPIRATORY W GRAM STAIN  CULTURE, BLOOD (ROUTINE X 2)  CULTURE, BLOOD (ROUTINE X 2)  AMMONIA  LACTIC ACID, PLASMA  ETHANOL  TSH  BLOOD GAS, ARTERIAL  MISC [facility] TEST  (SEND OUT)  TRIGLYCERIDES  BASIC METABOLIC PANEL WITH GFR  CBC  TROPONIN T, HIGH SENSITIVITY  TROPONIN T, HIGH SENSITIVITY    EKG: EKG Interpretation Date/Time:  Thursday January 27 2024 12:12:48 EST Ventricular Rate:  73 PR Interval:  154 QRS Duration:  101 QT Interval:  372 QTC Calculation:  410 R Axis:   81  Text Interpretation: Sinus rhythm RAE, consider biatrial enlargement Probable left ventricular hypertrophy Inferior infarct, acute (LCx) Anterior ST elevation, probably due to LVH Lateral leads are also involved sl improvement since prior today Confirmed by Towana Sharper [phone]) on 01/27/2024 12:15:09 PM  Radiology: [facility] Chest Port 1 View Result Date: 01/27/2024 CLINICAL DATA:  Endotracheal tube evaluation. EXAM: PORTABLE CHEST 1 VIEW COMPARISON:  01/27/2024 at 1:24 p.m. FINDINGS: Endotracheal tube has tip with 2 cm above the carina. Nasogastric tube has side-port over the stomach in the left upper quadrant with tip not visualized. Lungs are adequately inflated worsening opacification over the left base/retrocardiac region likely an effusion with associated basilar atelectasis. Infection over the mid to lower left lung is possible. Remainder of the lungs are unchanged. Cardiomediastinal silhouette and remainder of the exam is unchanged. IMPRESSION: 1. Worsening opacification over the left base/retrocardiac region likely an effusion with associated basilar atelectasis. Infection over the mid to lower left lung is possible. 2. Tubes and lines as described. Electronically Signed   By: Toribio Agreste M.D.   On: 01/27/2024 16:45   DG Chest Portable 1 View Result Date: 01/27/2024 CLINICAL DATA:  Status post intubation and orogastric tube placement. EXAM: PORTABLE CHEST 1 VIEW COMPARISON:  January 27, 2024 (10:58 a.m.) FINDINGS: Limited study secondary to patient rotation. Endotracheal tube is seen with its distal tip approximately 8.9 cm from the carina. An orogastric tube is in place with its  distal end extending into the body of the stomach. The heart size and mediastinal contours are within normal limits. Low lung volumes are noted on the left with mild elevation of the left hemidiaphragm. Mild to moderate severity left basilar atelectasis and/or infiltrate is seen. Mild to moderate severity scarring, atelectasis and/or infiltrate is seen along the medial aspect of the left upper lobe, adjacent to the superior mediastinum. A small adjacent radiopaque surgical clip is present. There is a small left pleural effusion. The right lung is clear. No pneumothorax is identified. The visualized skeletal structures are unremarkable. IMPRESSION: 1. Endotracheal tube and orogastric tube positioning, as described above. 2. Mild to moderate severity left basilar atelectasis and/or infiltrate. 3. Mild to moderate severity scarring, atelectasis and/or infiltrate along the medial aspect of the left upper lobe. 4. Small left pleural effusion. Electronically Signed   By: Suzen Dials M.D.   On: 01/27/2024 13:37   CT Head Wo Contrast Result Date: 01/27/2024 EXAM: CT HEAD WITHOUT CONTRAST 01/27/2024 01:19:37 PM TECHNIQUE: CT of the head was performed without the administration of intravenous contrast. Automated exposure control, iterative reconstruction, and/or weight based adjustment of the mA/kV was utilized to reduce the radiation dose to as low as reasonably achievable. COMPARISON: None available. CLINICAL HISTORY: Mental status change, unknown cause FINDINGS: BRAIN AND VENTRICLES: No acute hemorrhage. No evidence of acute infarct. No hydrocephalus. No extra-axial collection. No mass effect or midline shift. Similar 6-7 mm inferior cerebellar tonsillar descent at the foramen magnum, compatible with Chiari I malformation. ORBITS: No acute abnormality. SINUSES: No acute abnormality. SOFT TISSUES AND SKULL: No acute soft tissue abnormality. No skull fracture. IMPRESSION: 1. No acute intracranial abnormality. 2.  Stable Chiari I malformation. Electronically signed by: Glendia Molt MD 01/27/2024 01:30 PM EST RP Workstation: [id number]   DG Chest Port 1 View Result Date: 01/27/2024 CLINICAL DATA:  Possible seizure.  History of left lung cancer. EXAM: PORTABLE CHEST 1 VIEW COMPARISON:  Chest x-ray 03/25/2022 and CT 11/02/2023 FINDINGS: Patient is rotated to the  left. Lungs are adequately inflated. Postsurgical changes over the medial suprahilar region which are stable given differences in patient positioning. No focal lobar consolidation or effusion. Cardiomediastinal silhouette and remainder of the exam is unchanged. IMPRESSION: 1. No acute cardiopulmonary disease. 2. Stable postsurgical changes over the medial suprahilar region. Electronically Signed   By: Toribio Agreste M.D.   On: 01/27/2024 11:10     .Critical Care  Performed by: Towana Ozell BROCKS, MD Authorized by: Towana Ozell BROCKS, MD   Critical care provider statement:    Critical care time (minutes):  45   Critical care time was exclusive of:  Separately billable procedures and treating other patients   Critical care was necessary to treat or prevent imminent or life-threatening deterioration of the following conditions:  CNS failure or compromise and respiratory failure   Critical care was time spent personally by me on the following activities:  Development of treatment plan with patient or surrogate, discussions with consultants, evaluation of patient's response to treatment, examination of patient, obtaining history from patient or surrogate, ordering and performing treatments and interventions, ordering and review of laboratory studies, ordering and review of radiographic studies, pulse oximetry, re-evaluation of patient's condition and review of old charts   I assumed direction of critical care for this patient from another provider in my specialty: no   Procedure Name: Intubation Date/Time: 01/27/2024 5:10 PM  Performed by: Towana Ozell BROCKS,  MDPre-anesthesia Checklist: Patient identified, Patient being monitored, Emergency Drugs available, Timeout performed and Suction available Oxygen Delivery Method: Non-rebreather mask Preoxygenation: Pre-oxygenation with 100% oxygen Induction Type: Rapid sequence Ventilation: Mask ventilation without difficulty Laryngoscope Size: Glidescope and 3 Grade View: Grade II Number of attempts: 1 Airway Equipment and Method: Video-laryngoscopy Placement Confirmation: ETT inserted through vocal cords under direct vision, CO2 detector and Breath sounds checked- equal and bilateral Secured at: 21 cm Tube secured with: ETT holder Dental Injury: Teeth and Oropharynx as per pre-operative assessment        Medications Ordered in the ED  heparin  injection 5,000 Units (5,000 Units Subcutaneous Given 01/27/24 1531)  Chlorhexidine  Gluconate Cloth 2 % PADS 6 each (6 each Topical Given 01/27/24 1500)  pantoprazole  (PROTONIX ) injection 40 mg (has no administration in time range)  lactated ringers  infusion ( Intravenous Rate/Dose Change 01/27/24 1533)  acetaminophen  (TYLENOL ) tablet 650 mg (has no administration in time range)  polyethylene glycol (MIRALAX  / GLYCOLAX ) packet 17 g (has no administration in time range)  senna (SENOKOT) tablet 8.6 mg (has no administration in time range)  ondansetron  (ZOFRAN ) injection 4 mg (has no administration in time range)  revefenacin  (YUPELRI ) nebulizer solution 175 mcg (175 mcg Nebulization Not Given 01/27/24 1551)  arformoterol  (BROVANA ) nebulizer solution 15 mcg (has no administration in time range)  budesonide  (PULMICORT ) nebulizer solution 0.5 mg (has no administration in time range)  ipratropium-albuterol  (DUONEB) 0.5-2.5 (3) MG/3ML nebulizer solution 3 mL (has no administration in time range)  senna (SENOKOT) tablet 8.6 mg (8.6 mg Per Tube Given 01/27/24 1531)  polyethylene glycol (MIRALAX  / GLYCOLAX ) packet 17 g (17 g Per Tube Given 01/27/24 1531)  propofol   (DIPRIVAN ) 1000 MG/100ML infusion (40 mcg/kg/min  46.3 kg Intravenous IV Pump Association 01/27/24 1532)  midazolam  PF (VERSED ) injection 1-2 mg (has no administration in time range)  Oral care mouth rinse (15 mLs Mouth Rinse Given 01/27/24 1800)  Oral care mouth rinse (has no administration in time range)  methylPREDNISolone  sodium succinate (SOLU-MEDROL ) 40 mg/mL injection 40 mg (40 mg Intravenous Given 01/27/24 1531)  doxycycline  (VIBRAMYCIN ) 100 mg in sodium chloride  0.9 % 250 mL IVPB (100 mg Intravenous New Bag/Given 01/27/24 1613)  fentaNYL  in NS (96mcg/ml) infusion-PREMIX (25 mcg/hr Intravenous New Bag/Given 01/27/24 1600)  fentaNYL  (SUBLIMAZE ) bolus via infusion 25-100 mcg (has no administration in time range)  acetaZOLAMIDE  (DIAMOX ) injection 500 mg (500 mg Intravenous Given 01/27/24 1614)  cefTRIAXone  (ROCEPHIN ) 2 g in sodium chloride  0.9 % 100 mL IVPB (2 g Intravenous New Bag/Given 01/27/24 1705)  albuterol  (PROVENTIL ,VENTOLIN ) solution continuous neb (10 mg/hr Nebulization New Bag/Given 01/27/24 1634)  sodium chloride  0.9 % bolus 1,000 mL (0 mLs Intravenous Stopped 01/27/24 1131)  ipratropium-albuterol  (DUONEB) 0.5-2.5 (3) MG/3ML nebulizer solution 3 mL (3 mLs Nebulization Given 01/27/24 1132)  methylPREDNISolone  sodium succinate (SOLU-MEDROL ) 125 mg/2 mL injection 125 mg (125 mg Intravenous Given 01/27/24 1132)  iohexol  (OMNIPAQUE ) 300 MG/ML solution 100 mL (100 mLs Intravenous Contrast Given 01/27/24 1246)  etomidate  (AMIDATE ) injection 20 mg (20 mg Intravenous Given 01/27/24 1300)  succinylcholine  (ANECTINE ) syringe 100 mg (100 mg Intravenous Given 01/27/24 1300)  levETIRAcetam  (KEPPRA ) undiluted injection 1,000 mg (1,000 mg Intravenous Given 01/27/24 1304)  magnesium  sulfate IVPB 2 g 50 mL (2 g Intravenous New Bag/Given 01/27/24 1553)  fentaNYL  (SUBLIMAZE ) injection 25-50 mcg (50 mcg Intravenous Given 01/27/24 1555)  rocuronium  (ZEMURON ) injection 100 mg (100 mg Intravenous Given  01/27/24 1703)    Clinical Course as of 01/27/24 1709  Thu Jan 27, 2024  1024 Rectal temperature is 31.4 degrees.  Have ordered for Foley.  Respiratory coming down to see if we can get her on high flow. [MB]  1042 Reviewed EKG with Dr. Alvan cardiology.  He recommended discussing with the interventional attending at [facility]. [MB]  1058 EKG concerning for STEMI.  Discussed with [facility] interventional cardiology Dr Darron via [facility].  They are not recommending activation at this time.  Recommended continued workup including rewarming and serial EKGs. [MB]  1108 Chest x-ray interpreted by me as no acute infiltrate.  Awaiting radiology reading. [MB]  1122 Currently on 5 L nasal cannula high flow [MB]  1137 Discussed with critical care Dr. Gatha.  He is excepting the patient for admission. [MB]  1307 Patient had a change in condition.  Over and CT she had possible seizure-like activity.  I found her less responsive with some poor respiratory effort.  She was brought back to the main ED and intubated with glide scope.  Succinylcholine  and etomidate .  Cords visualized and and intubated.  Positive end-tidal CO2.  21 cm at the lip.  7.5 mm tube.  Keppra  IV ordered.  Propofol  for sedation ordered.  [facility] transport team here.  Getting a post intubation x-ray before transfer. [MB]  1328 ET tube in good position and NG below the diaphragm per my interpretation of portable chest x-ray.  Critical care team updated.  [facility] is here for transport of patient. [MB]    Clinical Course User Index [MB] Towana Ozell BROCKS, MD                                 Medical Decision Making Amount and/or Complexity of Data Reviewed Labs: ordered. Radiology: ordered.  Risk Prescription drug management. Decision regarding hospitalization.   This patient complains of change in mental status, possible seizure; this involves an extensive number of treatment Options and is a complaint that carries with it a high risk of  complications and morbidity. The differential includes infection, hypercapnia, COPD,  stroke, bleed, mass, seizure  I ordered, reviewed and interpreted labs, which included CBC unremarkable, chemistries with elevated sodium elevated bicarb, VBG without acidosis but significant CO2 retention, blood culture sent, troponin normal, TSH normal, ammonia normal I ordered medication IV steroids and breathing treatments, medication for intubation and sedation, IV antiepileptic and reviewed PMP when indicated. I ordered imaging studies which included chest x-ray, CT head, postintubation chest x-ray and I independently    visualized and interpreted imaging which showed possible signs of infection, tubes in good position Additional history obtained from EMS Previous records obtained and reviewed in epic, has a history of cancer and COPD notes from oncology and pulmonology I consulted critical care Dr. Gatha and discussed lab and imaging findings and discussed disposition.  Cardiac monitoring reviewed, sinus rhythm Social determinants considered, Food insecurity social isolation Critical Interventions: Workup of altered mental status, ultimately intubation and management of same, discussion with critical care  After the interventions stated above, I reevaluated the patient and found patient to be ventilating better after intubation Admission and further testing considered, [facility] is here to take her to [facility] ICU for further management.  No family at bedside.      Final diagnoses:  Acute respiratory failure with hypoxia and hypercapnia (HCC)  Seizure-like activity [facility])    ED Discharge Orders     None          Towana Ozell BROCKS, MD 01/27/24 1714  "

## 2024-01-27 NOTE — Progress Notes (Signed)
 LTM EEG hooked up and running - no initial skin breakdown - push button tested - [facility] monitoring.

## 2024-01-28 ENCOUNTER — Inpatient Hospital Stay (HOSPITAL_COMMUNITY)

## 2024-01-28 DIAGNOSIS — R0902 Hypoxemia: Secondary | ICD-10-CM

## 2024-01-28 DIAGNOSIS — R0689 Other abnormalities of breathing: Secondary | ICD-10-CM | POA: Diagnosis not present

## 2024-01-28 DIAGNOSIS — R569 Unspecified convulsions: Secondary | ICD-10-CM | POA: Diagnosis not present

## 2024-01-28 LAB — CBC
HCT: 32.2 % — ABNORMAL LOW (ref 36.0–46.0)
Hemoglobin: 9.4 g/dL — ABNORMAL LOW (ref 12.0–15.0)
MCH: 30.7 pg (ref 26.0–34.0)
MCHC: 29.2 g/dL — ABNORMAL LOW (ref 30.0–36.0)
MCV: 105.2 fL — ABNORMAL HIGH (ref 80.0–100.0)
Platelets: 74 K/uL — ABNORMAL LOW (ref 150–400)
RBC: 3.06 MIL/uL — ABNORMAL LOW (ref 3.87–5.11)
RDW: 11.8 % (ref 11.5–15.5)
WBC: 5.6 K/uL (ref 4.0–10.5)
nRBC: 0 % (ref 0.0–0.2)

## 2024-01-28 LAB — BASIC METABOLIC PANEL WITH GFR
Anion gap: 8 (ref 5–15)
BUN: 18 mg/dL (ref 8–23)
CO2: 33 mmol/L — ABNORMAL HIGH (ref 22–32)
Calcium: 8.3 mg/dL — ABNORMAL LOW (ref 8.9–10.3)
Chloride: 99 mmol/L (ref 98–111)
Creatinine, Ser: 0.54 mg/dL (ref 0.44–1.00)
GFR, Estimated: >60 mL/min
Glucose, Bld: 108 mg/dL — ABNORMAL HIGH (ref 70–99)
Potassium: 3.8 mmol/L (ref 3.5–5.1)
Sodium: 140 mmol/L (ref 135–145)

## 2024-01-28 LAB — TRIGLYCERIDES: Triglycerides: 97 mg/dL

## 2024-01-28 LAB — GLUCOSE, CAPILLARY
Glucose-Capillary: 95 mg/dL (ref 70–99)
Glucose-Capillary: 86 mg/dL (ref 70–99)
Glucose-Capillary: 126 mg/dL — ABNORMAL HIGH (ref 70–99)

## 2024-01-28 LAB — BLOOD CULTURE ID PANEL (REFLEXED) - BCID2

## 2024-01-28 LAB — MISC LABCORP TEST (SEND OUT): Labcorp test code: 83935

## 2024-01-28 MED ORDER — ORAL CARE MOUTH RINSE
15.0000 mL | OROMUCOSAL | Status: DC
  Administered 2024-01-28 – 2024-01-29 (×3): 15 mL via OROMUCOSAL

## 2024-01-28 MED ORDER — IOHEXOL 350 MG/ML SOLN
75.0000 mL | Freq: Once | INTRAVENOUS | Status: AC | PRN
  Administered 2024-01-28: 75 mL via INTRAVENOUS

## 2024-01-28 MED ORDER — LORAZEPAM 1 MG PO TABS
1.0000 mg | ORAL_TABLET | Freq: Four times a day (QID) | ORAL | Status: DC
  Administered 2024-01-28 – 2024-01-30 (×9): 1 mg via ORAL
  Filled 2024-01-28 (×9): qty 1

## 2024-01-28 MED ORDER — ENSURE PLUS HIGH PROTEIN PO LIQD
237.0000 mL | Freq: Three times a day (TID) | ORAL | Status: DC
  Administered 2024-01-28 – 2024-02-03 (×13): 237 mL via ORAL

## 2024-01-28 MED ORDER — PANTOPRAZOLE SODIUM 40 MG PO TBEC
40.0000 mg | DELAYED_RELEASE_TABLET | Freq: Every day | ORAL | Status: DC
  Administered 2024-01-28 – 2024-02-04 (×8): 40 mg via ORAL
  Filled 2024-01-28 (×8): qty 1

## 2024-01-28 MED ORDER — FOLIC ACID 1 MG PO TABS
1.0000 mg | ORAL_TABLET | Freq: Every day | ORAL | Status: DC
  Administered 2024-01-28 – 2024-02-04 (×8): 1 mg via ORAL
  Filled 2024-01-28 (×8): qty 1

## 2024-01-28 MED ORDER — LORAZEPAM 1 MG PO TABS
1.0000 mg | ORAL_TABLET | Freq: Four times a day (QID) | ORAL | Status: DC
  Administered 2024-01-28: 1 mg
  Filled 2024-01-28: qty 1

## 2024-01-28 MED ORDER — ADULT MULTIVITAMIN W/MINERALS CH
1.0000 | ORAL_TABLET | Freq: Every day | ORAL | Status: DC
  Administered 2024-01-28 – 2024-02-04 (×8): 1 via ORAL
  Filled 2024-01-28 (×8): qty 1

## 2024-01-28 MED ORDER — SENNA 8.6 MG PO TABS: 1.0000 | ORAL_TABLET | Freq: Two times a day (BID) | ORAL | Status: DC | PRN

## 2024-01-28 MED ORDER — POLYETHYLENE GLYCOL 3350 17 G PO PACK: 17.0000 g | PACK | Freq: Every day | ORAL | Status: DC | PRN

## 2024-01-28 MED ORDER — STERILE WATER FOR INJECTION IJ SOLN
INTRAMUSCULAR | Status: AC
  Administered 2024-01-28: 5 mL
  Filled 2024-01-28: qty 10

## 2024-01-28 MED ORDER — POLYETHYLENE GLYCOL 3350 17 G PO PACK
17.0000 g | PACK | Freq: Every day | ORAL | Status: DC
  Administered 2024-01-29 – 2024-02-04 (×6): 17 g via ORAL
  Filled 2024-01-28 (×7): qty 1

## 2024-01-28 MED ORDER — THIAMINE HCL 100 MG/ML IJ SOLN
100.0000 mg | Freq: Every day | INTRAMUSCULAR | Status: DC
  Administered 2024-01-28 – 2024-01-29 (×2): 100 mg via INTRAVENOUS
  Filled 2024-01-28 (×2): qty 2

## 2024-01-28 MED ORDER — ALBUTEROL SULFATE (2.5 MG/3ML) 0.083% IN NEBU
INHALATION_SOLUTION | RESPIRATORY_TRACT | Status: AC
  Administered 2024-01-28: 2.5 mg
  Filled 2024-01-28: qty 3

## 2024-01-28 MED ORDER — GADOBUTROL 1 MMOL/ML IV SOLN
5.0000 mL | Freq: Once | INTRAVENOUS | Status: AC | PRN
  Administered 2024-01-28: 5 mL via INTRAVENOUS

## 2024-01-28 MED ORDER — ACETAMINOPHEN 325 MG PO TABS
650.0000 mg | ORAL_TABLET | ORAL | Status: DC | PRN
  Administered 2024-02-02: 650 mg via ORAL
  Filled 2024-01-28: qty 2

## 2024-01-28 MED ORDER — ONDANSETRON HCL 4 MG PO TABS: 4.0000 mg | ORAL_TABLET | Freq: Four times a day (QID) | ORAL | Status: DC | PRN

## 2024-01-28 MED ORDER — SENNA 8.6 MG PO TABS
1.0000 | ORAL_TABLET | Freq: Two times a day (BID) | ORAL | Status: DC
  Administered 2024-01-28 – 2024-02-04 (×12): 8.6 mg via ORAL
  Filled 2024-01-28 (×14): qty 1

## 2024-01-28 MED ORDER — ORAL CARE MOUTH RINSE: 15.0000 mL | OROMUCOSAL | Status: DC | PRN

## 2024-01-28 MED ORDER — DOXYCYCLINE HYCLATE 100 MG PO TABS
100.0000 mg | ORAL_TABLET | Freq: Two times a day (BID) | ORAL | Status: AC
  Administered 2024-01-28 – 2024-01-30 (×4): 100 mg via ORAL
  Filled 2024-01-28 (×4): qty 1

## 2024-01-28 NOTE — Progress Notes (Signed)
 Initial Nutrition Assessment  DOCUMENTATION CODES:   Non-severe (moderate) malnutrition in context of chronic illness (COPD, chronic hypoxia) While current information available only supports moderate malnutrition, suspect pt is severely malnourished and diagnosis may be upgraded if more information becomes available at follow up  INTERVENTION:  Ensure Plus High Protein po TID, each supplement provides 350 kcal and 20 grams of protein.  Magic cup TID with meals, each supplement provides 290 kcal and 9 grams of protein  Encourage adequate PO intake to meet nutrition needs  Suspect refeeding risk given unknown intake hx and malnutrition Continue thiamine 100mg  daily and monitor electrolytes, MD to replete as needed  NUTRITION DIAGNOSIS:   Moderate Malnutrition related to chronic illness (COPD, chronic hypoxia) as evidenced by severe muscle depletion, severe fat depletion, moderate muscle depletion.  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 [facility]ed to [facility] via [facility] with AMS and seizure like activity, became hypoxic and intubated then transferred to [facility].  1/22 transferred from [facility], intubated 1/23 extubated   Pt was still ventilated at time of RD assessment, but RN reports pt will be extubated soon. Pt alert and awake responding to stimuli. Pt then successfully extubated and passed Bedside swallow eval with RN, upgraded to regular diet.   No visitors present at bedside, unable to obtain diet or wt hx at this time. All information obtained from chart review. Nutrition focused physical exam shows moderate to severe fat depletions and severe muscle depletions, indicative of malnutrition. Suspect severe  malnutrition related to chronic illness but with current evidence, can only diagnose with moderate malnutrition at this time.   Recommend ensure and magic cups to supplement pt's PO intake and help meet increased needs.   Nutritionally Relevant Medications: Folic acid  daily MVI w/ minerals Protonix  Miralax   Senna Thiamine 100 mg IV  Labs reviewed: CBG x 24 h: 86-136 mg/dL Sodium 859 <-- 852 No recent A1c  Admit weight: 47.1 kg Current weight: 50.3 kg   NUTRITION - FOCUSED PHYSICAL EXAM:  Flowsheet Row Most Recent Value  Orbital Region Moderate depletion  Upper Arm Region Moderate depletion  Thoracic and Lumbar Region Severe depletion  Buccal Region Unable to assess  Temple Region Severe depletion  Clavicle Bone Region Severe depletion  Clavicle and Acromion Bone Region Severe depletion  Scapular Bone Region Severe depletion  Dorsal Hand Unable to assess  Patellar Region Moderate depletion  Anterior Thigh Region Moderate depletion  Posterior Calf Region Unable to assess  Edema (RD Assessment) Mild  [BLE]  Hair Reviewed  Eyes Reviewed  Mouth Unable to assess  Skin Reviewed  Nails Reviewed    Diet Order:   Diet Order             Diet regular Room service appropriate? Yes; Fluid consistency: Thin  Diet effective now                   EDUCATION NEEDS:   No education needs have been identified at this time  Skin:  Skin Assessment: Reviewed RN Assessment  Last BM:  PTA  Height:   Ht Readings from Last 1 Encounters:  01/27/24 5' 3 (1.6 m)    Weight:   Wt Readings from Last 1 Encounters:  01/28/24 50.3 kg    Ideal Body Weight:  52.27 kg  BMI:  Body mass index is 19.64 kg/m.  Estimated Nutritional Needs:   Kcal:  1600-1800  Protein:  60-80g  Fluid:  1.6-1.8L    Josette Glance, MS, RDN, LDN Clinical Dietitian I Please reach out via secure chat

## 2024-01-28 NOTE — Progress Notes (Signed)
 Patient transported to MRI at 0207, and then to CT scan following MRI at 0312.  Transported back to ICU at 0330.  Patient tolerated well with no issues.  Will continue to monitor.

## 2024-01-28 NOTE — Progress Notes (Signed)
 PHARMACY - PHYSICIAN COMMUNICATION CRITICAL VALUE ALERT - BLOOD CULTURE IDENTIFICATION (BCID)  Donna Watkins is an 64 y.o. female who presented to [facility] on 01/27/2024 with a chief complaint of Acute hypoxic respiratory failure  Assessment:  1 of 4 GPC staph species, likely contaminant  Name of physician (or Provider) Contacted: Dr.Smith ([place])  Current antibiotics: Ceftriaxone  and Doxy  Changes to prescribed antibiotics recommended:  Patient is on recommended antibiotics - No changes needed  Results for orders placed or performed during the hospital encounter of 01/27/24  Blood Culture ID Panel (Reflexed) (Collected: 01/27/2024 10:20 AM)  Result Value Ref Range   Enterococcus faecalis NOT DETECTED NOT DETECTED   Enterococcus Faecium NOT DETECTED NOT DETECTED   Listeria monocytogenes NOT DETECTED NOT DETECTED   Staphylococcus species DETECTED (A) NOT DETECTED   Staphylococcus aureus (BCID) NOT DETECTED NOT DETECTED   Staphylococcus epidermidis NOT DETECTED NOT DETECTED   Staphylococcus lugdunensis NOT DETECTED NOT DETECTED   Streptococcus species NOT DETECTED NOT DETECTED   Streptococcus agalactiae NOT DETECTED NOT DETECTED   Streptococcus pneumoniae NOT DETECTED NOT DETECTED   Streptococcus pyogenes NOT DETECTED NOT DETECTED   A.calcoaceticus-baumannii NOT DETECTED NOT DETECTED   Bacteroides fragilis NOT DETECTED NOT DETECTED   Enterobacterales NOT DETECTED NOT DETECTED   Enterobacter cloacae complex NOT DETECTED NOT DETECTED   Escherichia coli NOT DETECTED NOT DETECTED   Klebsiella aerogenes NOT DETECTED NOT DETECTED   Klebsiella oxytoca NOT DETECTED NOT DETECTED   Klebsiella pneumoniae NOT DETECTED NOT DETECTED   Proteus species NOT DETECTED NOT DETECTED   Salmonella species NOT DETECTED NOT DETECTED   Serratia marcescens NOT DETECTED NOT DETECTED   Haemophilus influenzae NOT DETECTED NOT DETECTED   Neisseria meningitidis NOT DETECTED NOT DETECTED   Pseudomonas  aeruginosa NOT DETECTED NOT DETECTED   Stenotrophomonas maltophilia NOT DETECTED NOT DETECTED   Candida albicans NOT DETECTED NOT DETECTED   Candida auris NOT DETECTED NOT DETECTED   Candida glabrata NOT DETECTED NOT DETECTED   Candida krusei NOT DETECTED NOT DETECTED   Candida parapsilosis NOT DETECTED NOT DETECTED   Candida tropicalis NOT DETECTED NOT DETECTED   Cryptococcus neoformans/gattii NOT DETECTED NOT DETECTED    Larraine CHRISTELLA Brazier 01/28/2024  8:04 PM

## 2024-01-28 NOTE — Procedures (Signed)
 Extubation Procedure Note  Patient Details:   Name: Donna Watkins DOB: 07/12/1960 MRN: [id number]   Airway Documentation:  Airway 7.5 mm (Active)  Secured at (cm) 22 cm 01/28/24 0855  Measured From Lips 01/28/24 0855  Secured Location Left 01/28/24 0855  Secured By [employer] 01/28/24 0855  Bite Block No 01/28/24 0855  Tube Holder Repositioned Yes 01/28/24 0855  Prone position No 01/28/24 0800  Cuff Pressure (cm H2O) Green OR 18-26 [facility] 01/28/24 0855  Site Condition Dry 01/28/24 0855   Vent end date: (not recorded) Vent end time: (not recorded)   Evaluation  O2 sats: stable throughout Complications: No apparent complications Patient did tolerate procedure well. Bilateral Breath Sounds: Diminished   No Pt was successfully extubated with no apparent complications. Albuterol  PRN given prior extubation due to diminished breath sounds. Audible cuffleak heard prior extubation with Vte loss noted. Pt is unable to speak at this time and is able to provide small weak cough at this time. Pt VSS on 5L [facility] at this time.   Germain JAYSON Mater 01/28/2024, 10:43 AM

## 2024-01-28 NOTE — Progress Notes (Addendum)
 "  NAME:  Donna Watkins, MRN:  [id number], DOB:  December 27, 1960, LOS: 1 ADMISSION DATE:  01/27/2024, CONSULTATION DATE:  01/27/24 REFERRING MD:  Towana , CHIEF COMPLAINT:  AMS  History of Present Illness:   64 yo F PMH copd, chronic hypoxia on 4L, anxiety,chronic sacral wound, hx SCC lung s.p radiation,  who presented to [facility] ED 01/27/24 via EMS for AMS. Apparently family tried to awaken patient and reported sz like activity. She was hypoxic sats in 70s on her home 4L with EMS. EtCO2 was also high w EMS. In ED she was short of breath and altered. She was reportedly following commands. PCCM was called for transfer to [facility] & accepted in this setting  Prior to transfer, the patient apparently had a seizure vs sz like activity in CT & was intubated. It looks like she was given 1g keppra     Pertinent  Medical History  COPD Chronic hypoxic and hypercarbic respiratory failure  SCC lung s/p XRT  Significant Hospital Events: Including procedures, antibiotic start and stop dates in addition to other pertinent events   1/22 ?sz like activity at [facility]. Intubated. Txf to [facility]   Interim History / Subjective:  Extubated this a.m.  Objective    Blood pressure 116/75, pulse 86, temperature 98.2 F (36.8 C), resp. rate 20, height 5' 3 (1.6 m), weight 50.3 kg, last menstrual period 05/06/2010, SpO2 100%.    Vent Mode: PSV;CPAP FiO2 (%):  [40 %-70 %] 40 % Set Rate:  [16 bmp-20 bmp] 20 bmp Vt Set:  [420 mL] 420 mL PEEP:  [5 cmH20-8 cmH20] 5 cmH20 Pressure Support:  [8 cmH20] 8 cmH20 Plateau Pressure:  [22 cmH20-31 cmH20] 24 cmH20   Intake/Output Summary (Last 24 hours) at 01/28/2024 1001 Last data filed at 01/28/2024 0900 Gross per 24 hour  Intake 3548.23 ml  Output 1140 ml  Net 2408.23 ml   Filed Weights   01/27/24 1024 01/27/24 1457 01/28/24 0452  Weight: 46.3 kg 47.1 kg 50.3 kg    Examination: General: Chronically ill, not in distress HEENT: NCAT Lungs: Symmetrical chest wall movement, clear to  auscultation CVS: S1-S2 normal, no murmur Abdomen: Soft nontender nondistended Neuro: AO X3, following commands, GU: Foley    Resolved problem list   Assessment and Plan   64 year old female history of chronic hypoxic/hypercapnic respiratory failure on home 4L [place], small cell lung CA s/p radiation who presented with SOB, noted to have possible seizure-like activity, intubated  -Acute on chronic hypoxic hypercapnic respiratory failure-improving - Acute COPD exacerbation - Acute metabolic encephalopathy-likely due to above seizure-like activity at presentation, normal seizure reported Chiari I malformation-stable Hypernatremia Concern for benzo withdrawal-takes Xanax  1 mg every 5, started Ativan  1 mg Q6hrs   Plan - Continue ICU monitoring and management - Extubated this a.m., BiPAP at the bedside - Wean off nasal cannula as tolerated to maintain sats> 88% - Continue breathing treatment, steroid - Continue ceftriaxone  and Doxy for CAP - Continue EEG - Neurology team on board, no AEDs for now - Monitor and replace electrolyte as needed     Labs   CBC: Recent Labs  Lab 01/27/24 1014 01/27/24 1454 01/27/24 1540 01/28/24 0348  WBC 8.3 3.7*  --  5.6  NEUTROABS 7.7  --   --   --   HGB 12.8 10.3* 10.9* 9.4*  HCT 46.1* 35.7* 32.0* 32.2*  MCV 108.5* 106.6*  --  105.2*  PLT 113* 66*  --  74*    Basic Metabolic Panel: Recent  Labs  Lab 01/27/24 1014 01/27/24 1454 01/27/24 1540 01/27/24 2040 01/28/24 0348  NA 147*  --  145 145 140  K 5.1  --  4.5 3.8 3.8  CL 90*  --   --  96* 99  CO2 >45*  --   --  35* 33*  GLUCOSE 170*  --   --  111* 108*  BUN 20  --   --  20 18  CREATININE 0.35* 0.37*  --  0.51 0.54  CALCIUM  9.4  --   --  8.5* 8.3*  MG  --   --   --  2.4  --    GFR: Estimated Creatinine Clearance: 57.9 mL/min (by C-G formula based on SCr of 0.54 mg/dL). Recent Labs  Lab 01/27/24 1014 01/27/24 1015 01/27/24 1454 01/28/24 0348  WBC 8.3  --  3.7* 5.6   LATICACIDVEN  --  0.6  --   --     Liver Function Tests: Recent Labs  Lab 01/27/24 1014  AST 22  ALT 16  ALKPHOS 83  BILITOT 0.7  PROT 7.1  ALBUMIN 4.4   No results for input(s): LIPASE, AMYLASE in the last 168 hours. Recent Labs  Lab 01/27/24 1019  AMMONIA 32    ABG    Component Value Date/Time   PHART 7.307 (L) 01/27/2024 1540   PCO2ART 96.2 (HH) 01/27/2024 1540   PO2ART 56 (L) 01/27/2024 1540   HCO3 48.1 (H) 01/27/2024 1540   TCO2 >50 (H) 01/27/2024 1540   O2SAT 83 01/27/2024 1540     Coagulation Profile: No results for input(s): INR, PROTIME in the last 168 hours.  Cardiac Enzymes: No results for input(s): CKTOTAL, CKMB, CKMBINDEX, TROPONINI in the last 168 hours.  HbA1C: Hgb A1c MFr Bld  Date/Time Value Ref Range Status  05/12/2022 03:13 PM 5.4 4.8 - 5.6 % Final    Comment:             Prediabetes: 5.7 - 6.4          Diabetes: >6.4          Glycemic control for adults with diabetes: <7.0     CBG: Recent Labs  Lab 01/27/24 1452 01/27/24 1936 01/27/24 2317 01/28/24 0349 01/28/24 0753  GLUCAP 136* 105* 95 95 86    Review of Systems:   Unable to obtain intubated sedated   Past Medical History:  She,  has a past medical history of Anxiety, Cancer (HCC), COPD (chronic obstructive pulmonary disease) (HCC) (03/01/2012), Depression, Emphysema of lung (HCC), GERD (gastroesophageal reflux disease), History of radiation therapy, History of radiation therapy, Hyperlipidemia, Mild scoliosis, Oxygen deficiency, and Oxygen dependent.   Surgical History:   Past Surgical History:  Procedure Laterality Date   BREAST BIOPSY Left 06/25/2022   MM LT BREAST BX W LOC DEV 1ST LESION IMAGE BX SPEC STEREO GUIDE 06/25/2022 [facility]MO[facility]   BRONCHIAL BIOPSY  11/04/2020   Procedure: BRONCHIAL BIOPSIES;  Surgeon: Shelah Lamar RAMAN, MD;  Location: [facility] ENDOSCOPY;  Service: Pulmonary;;   BRONCHIAL BRUSHINGS  11/04/2020   Procedure: BRONCHIAL BRUSHINGS;   Surgeon: Shelah Lamar RAMAN, MD;  Location: [facility] ENDOSCOPY;  Service: Pulmonary;;   BRONCHIAL NEEDLE ASPIRATION BIOPSY  11/04/2020   Procedure: BRONCHIAL NEEDLE ASPIRATION BIOPSIES;  Surgeon: Shelah Lamar RAMAN, MD;  Location: [facility] ENDOSCOPY;  Service: Pulmonary;;   COLONOSCOPY N/A 09/09/2012   Procedure: COLONOSCOPY;  Surgeon: Margo LITTIE Haddock, MD;  Location: [facility] ENDO SUITE;  Service: Endoscopy;  Laterality: N/A;  1:15-moved to 12:45 Melanie notified  pt   COLONOSCOPY WITH PROPOFOL  N/A 11/09/2022   Procedure: COLONOSCOPY WITH PROPOFOL ;  Surgeon: Cindie Carlin POUR, DO;  Location: [facility] ENDO SUITE;  Service: Endoscopy;  Laterality: N/A;  845am, asa 3   FIDUCIAL MARKER PLACEMENT  11/04/2020   Procedure: FIDUCIAL MARKER PLACEMENT;  Surgeon: Shelah Lamar RAMAN, MD;  Location: [facility] ENDOSCOPY;  Service: Pulmonary;;   POLYPECTOMY  11/09/2022   Procedure: POLYPECTOMY;  Surgeon: Cindie Carlin POUR, DO;  Location: [facility] ENDO SUITE;  Service: Endoscopy;;   TUBAL LIGATION     VIDEO BRONCHOSCOPY WITH ENDOBRONCHIAL NAVIGATION N/A 11/04/2020   Procedure: ROBOTIC  VIDEO BRONCHOSCOPY WITH ENDOBRONCHIAL NAVIGATION;  Surgeon: Shelah Lamar RAMAN, MD;  Location: [facility] ENDOSCOPY;  Service: Pulmonary;  Laterality: N/A;     Social History:   reports that she quit smoking about 12 years ago. Her smoking use included cigarettes. She started smoking about 42 years ago. She has a 75 pack-year smoking history. She has never used smokeless tobacco. She reports that she does not drink alcohol and does not use drugs.   Family History:  Her family history includes Anxiety disorder in her mother; Arthritis in her sister; COPD in her mother; Heart disease in her father. There is no history of Colon cancer.   Allergies Allergies[1]   Home Medications  Prior to Admission medications  Medication Sig Start Date End Date Taking? Authorizing Provider  albuterol  (VENTOLIN  HFA) 108 (90 Base) MCG/ACT inhaler Inhale 2 puffs into the lungs every 4 (four) hours as  needed for wheezing or shortness of breath. 11/27/21   Johnson, Clanford L, MD  alendronate (FOSAMAX) 70 MG tablet Take 70 mg by mouth once a week. 09/10/22   [provider]  ALPRAZolam  (XANAX ) 1 MG tablet Take 1 tablet (1 mg total) by mouth 3 (three) times daily as needed for anxiety. 11/27/21   Johnson, Clanford L, MD  atorvastatin  (LIPITOR) 20 MG tablet Take 1 tablet (20 mg total) by mouth daily. Restart on 04/03/22 04/03/22   Evonnie Lenis, MD  ipratropium-albuterol  (DUONEB) 0.5-2.5 (3) MG/3ML SOLN Take 3 mLs by nebulization 3 (three) times daily. And as needed for SOB 11/27/21   Johnson, Clanford L, MD  Multiple Vitamins-Minerals (CENTRUM SILVER PO) Take 1 tablet by mouth daily.    [provider]  mupirocin ointment (BACTROBAN) 2 % Apply 1 Application topically. 07/01/22   [provider]  omeprazole (PRILOSEC) 20 MG capsule Take 20 mg by mouth daily.    [provider]  OXYGEN Place 3 L into the nose continuous.    [provider]       CRITICAL CARE Lenny Drought, MD  [facility] Prefer epic messenger for cross cover needs   My critical care time: 30 minutes  Critical care time was exclusive of separately billable procedures and treating other patients.  Critical care was necessary to treat or prevent imminent or life-threatening deterioration.  Critical care was time spent personally by me on the following activities: development of treatment plan with patient and/or surrogate as well as nursing, discussions with consultants, evaluation of patient's response to treatment, examination of patient, obtaining history from patient or surrogate, ordering and performing treatments and interventions, ordering and review of laboratory studies, ordering and review of radiographic studies, pulse oximetry, re-evaluation of patient's condition and participation in multidisciplinary rounds.      [1] No Known Allergies  "

## 2024-01-28 NOTE — Procedures (Addendum)
 Patient Name: Donna Watkins  MRN: [id number]  Epilepsy Attending: Arlin MALVA Krebs  Referring Physician/Provider: Daren Ronnald FORBES, NP  Duration: 01/27/2024 1756 to 01/28/2024 1756  Patient history: 64 y.o. female with hx of end-stage COPD, hyperlipidemia, lung cancer, anxiety and depression who presents with altered mental status and possible seizure activity. EEG to evaluate for seizure  Level of alertness: comatose/ lethargic --->awake  AEDs during EEG study: Propofol   Technical aspects: This EEG study was done with scalp electrodes positioned according to the 10-20 International system of electrode placement. Electrical activity was reviewed with band pass filter of 1-70Hz , sensitivity of 7 uV/mm, display speed of 60mm/sec with a 60Hz  notched filter applied as appropriate. EEG data were recorded continuously and digitally stored.  Video monitoring was available and reviewed as appropriate.  Description: EEG showed continuous generalized 3 to 6 Hz theta-delta slowing admixed with 12-15hz  beta activity distributed symmetrically and diffusely. As sedation was weaned, eeg improved and showed continuous generalized predominantly 5-7hz  theta slowing. Hyperventilation and photic stimulation were not performed.     ABNORMALITY - Continuous slow, generalized  IMPRESSION: This study is suggestive of generalized cerebral dysfunction (encephalopathy) nonspecific etiology but likely related to sedation. No seizures or epileptiform discharges were seen throughout the recording.  Edwena Mayorga O Malynda Smolinski

## 2024-01-28 NOTE — Progress Notes (Signed)
 Patient being transported by RT and Rhoda Mink, Consulting Civil Engineer to MRI. Patient disconnected from cEEG.

## 2024-01-28 NOTE — Care Management (Signed)
 Transition of Care [facility]) - Inpatient Brief Assessment   Patient Details  Name: Donna Watkins MRN: [id number] Date of Birth: 09-21-1960  [facility] of [facility]) CM/SW Contact:    Corean JAYSON Canary, RN Phone Number: 01/28/2024, 4:58 PM   Clinical Narrative: 64 year old on 4 L of oxygen at home. According to [facility] had [facility] last year for home health.  Was intubated for hypercarbia, extubated today.   IPCM will follow for needs   Transition of Care Asessment: Insurance and Status: Insurance coverage has been reviewed Patient has primary care physician: Yes Home environment has been reviewed: Lives with family Prior level of function:: on home oxygen Prior/Current Home Services: No current home services Social Drivers of Health Review: SDOH reviewed needs interventions Readmission risk has been reviewed: Yes Transition of care needs: transition of care needs identified, TOC will continue to follow

## 2024-01-28 NOTE — Progress Notes (Signed)
" °   01/28/24 0947  Adult Ventilator Settings  Vent Type Servo i  Humidity HME  Vent Mode PSV;CPAP  FiO2 (%) 40 %  Pressure Support 8 cmH20  PEEP 5 cmH20   Pt placed on PS/CPAP and is tolerating well at this time.  "

## 2024-01-28 NOTE — Progress Notes (Signed)
MB performed maintenance on electrodes. All impedances are below 10k ohms. No skin breakdown noted.  

## 2024-01-28 NOTE — Progress Notes (Signed)
 NEUROLOGY CONSULT FOLLOW UP NOTE   Date of service: January 28, 2024 Patient Name: Donna Watkins MRN:  [id number] DOB:  11-18-1960  Interval Hx/subjective   Failed wean today, propofol  paused and spontaneous eye opening but not following commands on exam  Vitals   Vitals:   01/28/24 0745 01/28/24 0800 01/28/24 0815 01/28/24 0830  BP: 102/66 101/67 94/65 97/61   Pulse: 78 79 77   Resp: 20 20 20 20   Temp: 98.8 F (37.1 C) 98.8 F (37.1 C) 98.6 F (37 C) 98.6 F (37 C)  TempSrc:  Bladder    SpO2: 100% 100% 100% 100%  Weight:      Height:         Body mass index is 19.64 kg/m.  Physical Exam   Constitutional: Chronically ill-appearing intubated patient in no acute distress Eyes: No scleral injection.  HENT: Endotracheal tube in place Head: Normocephalic.  Cardiovascular: Normal rate and regular rhythm.  Respiratory: Respirations synchronous with ventilator Skin: WDI  Neurologic Examination   Sedation paused 5 minutes prior to exam pupils equal round and reactive, eyes open spontaneously, oculocephalic reflex present, corneal reflexes present, patient does not respond to name or follow commands but does grimace to sternal rub, withdraws minimally in extremities  Medications Current Medications[1]  Labs and Diagnostic Imaging   CBC:  Recent Labs  Lab 01/27/24 1014 01/27/24 1454 01/27/24 1540 01/28/24 0348  WBC 8.3 3.7*  --  5.6  NEUTROABS 7.7  --   --   --   HGB 12.8 10.3* 10.9* 9.4*  HCT 46.1* 35.7* 32.0* 32.2*  MCV 108.5* 106.6*  --  105.2*  PLT 113* 66*  --  74*    Basic Metabolic Panel:  Lab Results  Component Value Date   NA 140 01/28/2024   K 3.8 01/28/2024   CO2 33 (H) 01/28/2024   GLUCOSE 108 (H) 01/28/2024   BUN 18 01/28/2024   CREATININE 0.54 01/28/2024   CALCIUM  8.3 (L) 01/28/2024   GFRNONAA >60 01/28/2024   GFRAA >90 06/15/2011   Lipid Panel:  Lab Results  Component Value Date   LDLCALC 82 05/12/2022   HgbA1c:  Lab Results   Component Value Date   HGBA1C 5.4 05/12/2022   Urine Drug Screen:     Component Value Date/Time   LABOPIA NEGATIVE 01/27/2024 1029   COCAINSCRNUR NEGATIVE 01/27/2024 1029   LABBENZ POSITIVE (A) 01/27/2024 1029   AMPHETMU NEGATIVE 01/27/2024 1029   THCU NEGATIVE 01/27/2024 1029   LABBARB NEGATIVE 01/27/2024 1029    Alcohol Level     Component Value Date/Time   ETH <15 01/27/2024 1015   INR  Lab Results  Component Value Date   INR 0.9 11/04/2020   APTT  Lab Results  Component Value Date   APTT 23 (L) 11/26/2021   CT Head without contrast(Personally reviewed): No acute abnormality  MRI Brain w wo(Personally reviewed): No acute abnormality.  Unchanged Chiari malformation  CT chest abdomen pelvis Multifocal pneumonia (left lobe) Cholelithiasis with gallbladder wall calcification  cEEG: 01/27/2024 1756 to 01/28/2024 0840  This study is suggestive of generalized cerebral dysfunction (encephalopathy) nonspecific etiology but likely related to sedation. No seizures or epileptiform discharges were seen throughout the recording.   Assessment   [name] is a 64 y.o. female with PMH end-stage COPD, hyperlipidemia, lung cancer, anxiety and depression who presented to [facility] after being found confused by her sister with possible seizure activity.  She reportedly had a seizure-like episode at [facility] while in [place].  She was intubated after the episode and was noted to be hypercarbic.  She was given a 1g Keppra  and is now on propofol .  IMP Seizure like activity in the setting of hypoxia and hypercarbia   Recommendations  - continue LTM until extubated and can have a good exam  - minimize sedation and repeat exam - No AED for now  D/W ICU team Dr Sharie _____________________________________________________________________   Signed, Jorene Last, NP [facility]   Attending Neurohospitalist Addendum Patient seen and examined with  APP/Resident. Agree with the history and physical as documented above. Agree with the plan as documented, which I helped formulate. I have independently reviewed the chart, obtained history, review of systems and examined the patient.I have personally reviewed pertinent head/neck/spine imaging (CT/MRI). Please feel free to call with any questions.  -- Eligio Lav, MD Neurologist [facility] Pager: [phone]    [1]  Current Facility-Administered Medications:    acetaminophen  (TYLENOL ) tablet 650 mg, 650 mg, Per Tube, Q4H PRN, Bowser, Ronnald BRAVO, NP, 650 mg at 01/27/24 1911   arformoterol  (BROVANA ) nebulizer solution 15 mcg, 15 mcg, Nebulization, BID, Bowser, Grace E, NP, 15 mcg at 01/27/24 2010   budesonide  (PULMICORT ) nebulizer solution 0.5 mg, 0.5 mg, Nebulization, BID, Bowser, Grace E, NP, 0.5 mg at 01/27/24 2010   cefTRIAXone  (ROCEPHIN ) 2 g in sodium chloride  0.9 % 100 mL IVPB, 2 g, Intravenous, Q24H, Paytes, Maurilio PENNER, RPH, Stopped at 01/27/24 1735   Chlorhexidine  Gluconate Cloth 2 % PADS 6 each, 6 each, Topical, Daily, Bowser, Ronnald BRAVO, NP, 6 each at 01/27/24 1500   doxycycline  (VIBRAMYCIN ) 100 mg in sodium chloride  0.9 % 250 mL IVPB, 100 mg, Intravenous, Q12H, Bowser, Grace E, NP, Last Rate: 125 mL/hr at 01/28/24 0600, Infusion Verify at 01/28/24 0600   fentaNYL  (SUBLIMAZE ) bolus via infusion 25-100 mcg, 25-100 mcg, Intravenous, Q15 min PRN, Bowser, Ronnald BRAVO, NP, 50 mcg at 01/28/24 0313   fentaNYL  in NS (70mcg/ml) infusion-PREMIX, 0-400 mcg/hr, Intravenous, Continuous, Bowser, Grace E, NP, Last Rate: 5 mL/hr at 01/28/24 0600, 50 mcg/hr at 01/28/24 0600   heparin  injection 5,000 Units, 5,000 Units, Subcutaneous, Q8H, Bowser, Grace E, NP, 5,000 Units at 01/28/24 0539   ipratropium-albuterol  (DUONEB) 0.5-2.5 (3) MG/3ML nebulizer solution 3 mL, 3 mL, Nebulization, Q4H PRN, Bowser, Ronnald BRAVO, NP   lactated ringers  infusion, , Intravenous, Continuous, Bowser, Ronnald BRAVO, NP,  Last Rate: 100 mL/hr at 01/28/24 0600, Infusion Verify at 01/28/24 0600   methylPREDNISolone  sodium succinate (SOLU-MEDROL ) 40 mg/mL injection 40 mg, 40 mg, Intravenous, Q12H, Bowser, Grace E, NP, 40 mg at 01/28/24 0406   midazolam  PF (VERSED ) injection 1-2 mg, 1-2 mg, Intravenous, Q1H PRN, Bowser, Grace E, NP   ondansetron  (ZOFRAN ) injection 4 mg, 4 mg, Intravenous, Q6H PRN, Bowser, Grace E, NP   Oral care mouth rinse, 15 mL, Mouth Rinse, Q2H, Alghanim, Fahid, MD, 15 mL at 01/28/24 0539   Oral care mouth rinse, 15 mL, Mouth Rinse, PRN, Alghanim, Fahid, MD   pantoprazole  (PROTONIX ) injection 40 mg, 40 mg, Intravenous, QHS, Bowser, Grace E, NP, 40 mg at 01/27/24 2121   polyethylene glycol (MIRALAX  / GLYCOLAX ) packet 17 g, 17 g, Per Tube, Daily PRN, Bowser, Ronnald BRAVO, NP   polyethylene glycol (MIRALAX  / GLYCOLAX ) packet 17 g, 17 g, Per Tube, Daily, Bowser, Grace E, NP, 17 g at 01/27/24 1531   propofol  (DIPRIVAN ) 1000 MG/100ML infusion, 0-80 mcg/kg/min, Intravenous, Continuous, Bowser, Grace E, NP, Last Rate: 9.72 mL/hr  at 01/28/24 0600, 35 mcg/kg/min at 01/28/24 0600   revefenacin  (YUPELRI ) nebulizer solution 175 mcg, 175 mcg, Nebulization, Daily, Bowser, Grace E, NP   senna (SENOKOT) tablet 8.6 mg, 1 tablet, Per Tube, BID PRN, Bowser, Grace E, NP   senna (SENOKOT) tablet 8.6 mg, 1 tablet, Per Tube, BID, Bowser, Ronnald BRAVO, NP, 8.6 mg at 01/27/24 2121

## 2024-01-28 NOTE — Progress Notes (Signed)
 VENTILATOR WEAN NOTE 01/28/2024  Start Mode: PCV  Wean Mode: Pressure Support  Duration before failure: <1 min  Reason for failure: Apnea  Notes: Attempted SBT on patient on CPAP/PSV of 15/8 however patient became apneic and was placed back on full support ventilation.  Tolerating well at this time.  Will continue to monitor.

## 2024-01-29 ENCOUNTER — Inpatient Hospital Stay (HOSPITAL_COMMUNITY)

## 2024-01-29 ENCOUNTER — Encounter (HOSPITAL_COMMUNITY): Payer: Self-pay

## 2024-01-29 DIAGNOSIS — J9622 Acute and chronic respiratory failure with hypercapnia: Secondary | ICD-10-CM | POA: Diagnosis not present

## 2024-01-29 DIAGNOSIS — J441 Chronic obstructive pulmonary disease with (acute) exacerbation: Secondary | ICD-10-CM | POA: Diagnosis not present

## 2024-01-29 DIAGNOSIS — Q0700 Arnold-Chiari syndrome without spina bifida or hydrocephalus: Secondary | ICD-10-CM

## 2024-01-29 DIAGNOSIS — E870 Hyperosmolality and hypernatremia: Secondary | ICD-10-CM | POA: Diagnosis not present

## 2024-01-29 DIAGNOSIS — R569 Unspecified convulsions: Secondary | ICD-10-CM | POA: Diagnosis not present

## 2024-01-29 DIAGNOSIS — J9621 Acute and chronic respiratory failure with hypoxia: Secondary | ICD-10-CM | POA: Diagnosis not present

## 2024-01-29 DIAGNOSIS — J189 Pneumonia, unspecified organism: Secondary | ICD-10-CM | POA: Diagnosis not present

## 2024-01-29 LAB — CBC
HCT: 34 % — ABNORMAL LOW (ref 36.0–46.0)
Hemoglobin: 10.3 g/dL — ABNORMAL LOW (ref 12.0–15.0)
MCH: 30.7 pg (ref 26.0–34.0)
MCHC: 30.3 g/dL (ref 30.0–36.0)
MCV: 101.2 fL — ABNORMAL HIGH (ref 80.0–100.0)
Platelets: 89 10*3/uL — ABNORMAL LOW (ref 150–400)
RBC: 3.36 MIL/uL — ABNORMAL LOW (ref 3.87–5.11)
RDW: 11.9 % (ref 11.5–15.5)
WBC: 10.4 10*3/uL (ref 4.0–10.5)
nRBC: 0 % (ref 0.0–0.2)

## 2024-01-29 LAB — BASIC METABOLIC PANEL WITH GFR
Anion gap: 7 (ref 5–15)
BUN: 15 mg/dL (ref 8–23)
CO2: 32 mmol/L (ref 22–32)
Calcium: 9 mg/dL (ref 8.9–10.3)
Chloride: 99 mmol/L (ref 98–111)
Creatinine, Ser: 0.37 mg/dL — ABNORMAL LOW (ref 0.44–1.00)
GFR, Estimated: >60 mL/min
Glucose, Bld: 88 mg/dL (ref 70–99)
Potassium: 4.1 mmol/L (ref 3.5–5.1)
Sodium: 139 mmol/L (ref 135–145)

## 2024-01-29 LAB — CULTURE, BLOOD (ROUTINE X 2)

## 2024-01-29 LAB — PHOSPHORUS: Phosphorus: 2.2 mg/dL — ABNORMAL LOW (ref 2.5–4.6)

## 2024-01-29 LAB — MAGNESIUM: Magnesium: 1.7 mg/dL (ref 1.7–2.4)

## 2024-01-29 MED ORDER — METHYLPREDNISOLONE SODIUM SUCC 40 MG IJ SOLR
40.0000 mg | Freq: Every day | INTRAMUSCULAR | Status: DC
  Administered 2024-01-30: 40 mg via INTRAVENOUS
  Filled 2024-01-29: qty 1

## 2024-01-29 MED ORDER — SODIUM PHOSPHATES 45 MMOLE/15ML IV SOLN
15.0000 mmol | Freq: Once | INTRAVENOUS | Status: AC
  Administered 2024-01-29: 15 mmol via INTRAVENOUS
  Filled 2024-01-29: qty 5

## 2024-01-29 MED ORDER — THIAMINE MONONITRATE 100 MG PO TABS
100.0000 mg | ORAL_TABLET | Freq: Every day | ORAL | Status: DC
  Administered 2024-01-30 – 2024-02-04 (×6): 100 mg via ORAL
  Filled 2024-01-29 (×6): qty 1

## 2024-01-29 MED ORDER — MAGNESIUM SULFATE 2 GM/50ML IV SOLN
2.0000 g | Freq: Once | INTRAVENOUS | Status: AC
  Administered 2024-01-29: 2 g via INTRAVENOUS
  Filled 2024-01-29: qty 50

## 2024-01-29 MED ORDER — ORAL CARE MOUTH RINSE: 15.0000 mL | OROMUCOSAL | Status: DC | PRN

## 2024-01-29 NOTE — Progress Notes (Signed)
 LTM EEG disconnected - no skin breakdown at [facility]. [facility] notified.

## 2024-01-29 NOTE — Procedures (Addendum)
 Patient Name: Donna Watkins  MRN: [id number]  Epilepsy Attending: Arlin MALVA Krebs  Referring Physician/Provider: Daren Ronnald FORBES, NP  Duration: 01/28/2024 1756 to 01/29/2024 1245   Patient history: 64 y.o. female with hx of end-stage COPD, hyperlipidemia, lung cancer, anxiety and depression who presents with altered mental status and possible seizure activity. EEG to evaluate for seizure   Level of alertness: awake, asleep   AEDs during EEG study: None   Technical aspects: This EEG study was done with scalp electrodes positioned according to the 10-20 International system of electrode placement. Electrical activity was reviewed with band pass filter of 1-70Hz , sensitivity of 7 uV/mm, display speed of 54mm/sec with a 60Hz  notched filter applied as appropriate. EEG data were recorded continuously and digitally stored.  Video monitoring was available and reviewed as appropriate.   Description: EEG initially showed continuous generalized 5-7hz  theta slowing. Gradually EEG improved and showed posterior dominant rhythm of 8 Hz activity of moderate voltage (25-35 uV) seen predominantly in posterior head regions, symmetric and reactive to eye opening and eye closing. Sleep was characterized by vertex waves, sleep spindles (12 to 14 Hz), maximal frontocentral region. Hyperventilation and photic stimulation were not performed.      ABNORMALITY - Continuous slow, generalized   IMPRESSION: This study was initially suggestive of generalized cerebral dysfunction (encephalopathy) likely related to sedation which gradually improved. Subsequently, EEG was within normal limits. No seizures or epileptiform discharges were seen throughout the recording.   Zaquan Duffner O Karle Desrosier

## 2024-01-29 NOTE — Progress Notes (Signed)
" °   01/29/24 1353  Vitals  Temp 97.9 F (36.6 C)  Temp Source Oral  BP 127/77  MAP (mmHg) 94  BP Location Right Arm  BP Method Automatic  Patient Position (if appropriate) Lying  Pulse Rate (!) 125  Pulse Rate Source Dinamap  Resp 20  Level of Consciousness  Level of Consciousness Alert  Oxygen Therapy  SpO2 94 %  O2 Device Nasal Cannula  O2 Flow Rate (L/min) 2 L/min   On admission "

## 2024-01-29 NOTE — Plan of Care (Signed)
   Problem: Clinical Measurements: Goal: Ability to maintain clinical measurements within normal limits will improve Outcome: Progressing Goal: Will remain free from infection Outcome: Progressing Goal: Diagnostic test results will improve Outcome: Progressing   Problem: Activity: Goal: Risk for activity intolerance will decrease Outcome: Progressing

## 2024-01-29 NOTE — Discharge Instructions (Signed)
 [facility]  [address] [place] [address] [phone] PANTRY T - F: 9a - 12p  [facility] [address] [place] [address] [phone] PANTRY Th: 9a - 12p (bi-weekly)  In [facility] [address] [place] [address] [phone] PANTRY W: 9a - 12p  [facility] [address] [place] [address] [phone] PANTRY W: aaron [place] side  [facility] [address] [place] [address] [phone] PANTRY Th: 4p-5p and emergencies as needed

## 2024-01-29 NOTE — Progress Notes (Addendum)
 "  NAME:  Donna Watkins, MRN:  [id number], DOB:  05/23/1960, LOS: 2 ADMISSION DATE:  01/27/2024, CONSULTATION DATE:  01/27/24 REFERRING MD:  Towana , CHIEF COMPLAINT:  AMS  History of Present Illness:   64 yo F PMH copd, chronic hypoxia on 4L, anxiety,chronic sacral wound, hx SCC lung s.p radiation,  who presented to [facility] ED 01/27/24 via EMS for AMS. Apparently family tried to awaken patient and reported sz like activity. She was hypoxic sats in 70s on her home 4L with EMS. EtCO2 was also high w EMS. In ED she was short of breath and altered. She was reportedly following commands. PCCM was called for transfer to [facility] & accepted in this setting  Prior to transfer, the patient apparently had a seizure vs sz like activity in CT & was intubated. It looks like she was given 1g keppra     Pertinent  Medical History  COPD Chronic hypoxic and hypercarbic respiratory failure  SCC lung s/p XRT  Significant Hospital Events: Including procedures, antibiotic start and stop dates in addition to other pertinent events   1/22 ?sz like activity at [facility]. Intubated. Txf to [facility]   Interim History / Subjective:  No acute event overnight, EEG not showing seizures Objective    Blood pressure 117/68, pulse 97, temperature 99 F (37.2 C), resp. rate (!) 23, height 5' 3 (1.6 m), weight 48.4 kg, last menstrual period 05/06/2010, SpO2 99%.    Vent Mode: PSV;CPAP FiO2 (%):  [36 %-50 %] 36 % Set Rate:  [20 bmp] 20 bmp PEEP:  [5 cmH20-8 cmH20] 5 cmH20 Pressure Support:  [8 cmH20] 8 cmH20 Plateau Pressure:  [24 cmH20] 24 cmH20   Intake/Output Summary (Last 24 hours) at 01/29/2024 0744 Last data filed at 01/29/2024 0600 Gross per 24 hour  Intake 1142.59 ml  Output 2310 ml  Net -1167.41 ml   Filed Weights   01/27/24 1457 01/28/24 0452 01/29/24 0412  Weight: 47.1 kg 50.3 kg 48.4 kg    Examination: General: Chronically ill, not in distress HEENT: NCAT Lungs: Symmetrical chest wall movement, clear to  auscultation CVS: S1-S2 normal, no murmur Abdomen: Soft nontender nondistended Neuro: AAO X3, following commands GU: Foley    Resolved problem list   Assessment and Plan   64 year old female history of chronic hypoxic/hypercapnic respiratory failure on home 4L [place], small cell lung CA s/p radiation who presented with SOB, noted to have possible seizure-like activity, intubated  -Acute on chronic hypoxic hypercapnic respiratory failure-improving - Acute COPD exacerbation-improving - Acute metabolic encephalopathy-improved  seizure-like activity at presentation, normal seizure reported-EEG has not shown any seizures Chiari I malformation-stable Hypernatremia Concern for benzo withdrawal-takes Xanax  1 mg every 5, started Ativan  1 mg Q6hrs   Plan - Sats well on nasal cannula 4L [place] -Continue cpap at night - Continue breathing treatment, steroid - Continue ceftriaxone  and Doxy for CAP - EEG did not show any seizures.  No need for AEDs - Monitor and replace electrolyte as needed - Stable to be transferred to [facility] @ [phone] updated    Labs   CBC: Recent Labs  Lab 01/27/24 1014 01/27/24 1454 01/27/24 1540 01/28/24 0348 01/29/24 0504  WBC 8.3 3.7*  --  5.6 10.4  NEUTROABS 7.7  --   --   --   --   HGB 12.8 10.3* 10.9* 9.4* 10.3*  HCT 46.1* 35.7* 32.0* 32.2* 34.0*  MCV 108.5* 106.6*  --  105.2* 101.2*  PLT 113* 66*  --  74* 89*    Basic Metabolic Panel: Recent Labs  Lab 01/27/24 1014 01/27/24 1454 01/27/24 1540 01/27/24 2040 01/28/24 0348 01/29/24 0504  NA 147*  --  145 145 140 139  K 5.1  --  4.5 3.8 3.8 4.1  CL 90*  --   --  96* 99 99  CO2 >45*  --   --  35* 33* 32  GLUCOSE 170*  --   --  111* 108* 88  BUN 20  --   --  20 18 15   CREATININE 0.35* 0.37*  --  0.51 0.54 0.37*  CALCIUM  9.4  --   --  8.5* 8.3* 9.0  MG  --   --   --  2.4  --  1.7  PHOS  --   --   --   --   --  2.2*   GFR: Estimated Creatinine Clearance: 55.7  mL/min (A) (by C-G formula based on SCr of 0.37 mg/dL (L)). Recent Labs  Lab 01/27/24 1014 01/27/24 1015 01/27/24 1454 01/28/24 0348 01/29/24 0504  WBC 8.3  --  3.7* 5.6 10.4  LATICACIDVEN  --  0.6  --   --   --     Liver Function Tests: Recent Labs  Lab 01/27/24 1014  AST 22  ALT 16  ALKPHOS 83  BILITOT 0.7  PROT 7.1  ALBUMIN 4.4   No results for input(s): LIPASE, AMYLASE in the last 168 hours. Recent Labs  Lab 01/27/24 1019  AMMONIA 32    ABG    Component Value Date/Time   PHART 7.307 (L) 01/27/2024 1540   PCO2ART 96.2 (HH) 01/27/2024 1540   PO2ART 56 (L) 01/27/2024 1540   HCO3 48.1 (H) 01/27/2024 1540   TCO2 >50 (H) 01/27/2024 1540   O2SAT 83 01/27/2024 1540     Coagulation Profile: No results for input(s): INR, PROTIME in the last 168 hours.  Cardiac Enzymes: No results for input(s): CKTOTAL, CKMB, CKMBINDEX, TROPONINI in the last 168 hours.  HbA1C: Hgb A1c MFr Bld  Date/Time Value Ref Range Status  05/12/2022 03:13 PM 5.4 4.8 - 5.6 % Final    Comment:             Prediabetes: 5.7 - 6.4          Diabetes: >6.4          Glycemic control for adults with diabetes: <7.0     CBG: Recent Labs  Lab 01/27/24 1936 01/27/24 2317 01/28/24 0349 01/28/24 0753 01/28/24 1123  GLUCAP 105* 95 95 86 126*    Review of Systems:   Unable to obtain intubated sedated   Past Medical History:  She,  has a past medical history of Anxiety, Cancer (HCC), COPD (chronic obstructive pulmonary disease) (HCC) (03/01/2012), Depression, Emphysema of lung (HCC), GERD (gastroesophageal reflux disease), History of radiation therapy, History of radiation therapy, Hyperlipidemia, Mild scoliosis, Oxygen deficiency, and Oxygen dependent.   Surgical History:   Past Surgical History:  Procedure Laterality Date   BREAST BIOPSY Left 06/25/2022   MM LT BREAST BX W LOC DEV 1ST LESION IMAGE BX SPEC STEREO GUIDE 06/25/2022 [facility]MO[facility]   BRONCHIAL BIOPSY   11/04/2020   Procedure: BRONCHIAL BIOPSIES;  Surgeon: Shelah Lamar RAMAN, MD;  Location: [facility] ENDOSCOPY;  Service: Pulmonary;;   BRONCHIAL BRUSHINGS  11/04/2020   Procedure: BRONCHIAL BRUSHINGS;  Surgeon: Shelah Lamar RAMAN, MD;  Location: [facility] ENDOSCOPY;  Service: Pulmonary;;   BRONCHIAL NEEDLE ASPIRATION BIOPSY  11/04/2020   Procedure: BRONCHIAL NEEDLE ASPIRATION  BIOPSIES;  Surgeon: Shelah Lamar RAMAN, MD;  Location: [facility] ENDOSCOPY;  Service: Pulmonary;;   COLONOSCOPY N/A 09/09/2012   Procedure: COLONOSCOPY;  Surgeon: Margo LITTIE Haddock, MD;  Location: [facility] ENDO SUITE;  Service: Endoscopy;  Laterality: N/A;  1:15-moved to 12:45 Melanie notified pt   COLONOSCOPY WITH PROPOFOL  N/A 11/09/2022   Procedure: COLONOSCOPY WITH PROPOFOL ;  Surgeon: Cindie Carlin POUR, DO;  Location: [facility] ENDO SUITE;  Service: Endoscopy;  Laterality: N/A;  845am, asa 3   FIDUCIAL MARKER PLACEMENT  11/04/2020   Procedure: FIDUCIAL MARKER PLACEMENT;  Surgeon: Shelah Lamar RAMAN, MD;  Location: [facility] ENDOSCOPY;  Service: Pulmonary;;   POLYPECTOMY  11/09/2022   Procedure: POLYPECTOMY;  Surgeon: Cindie Carlin POUR, DO;  Location: [facility] ENDO SUITE;  Service: Endoscopy;;   TUBAL LIGATION     VIDEO BRONCHOSCOPY WITH ENDOBRONCHIAL NAVIGATION N/A 11/04/2020   Procedure: ROBOTIC  VIDEO BRONCHOSCOPY WITH ENDOBRONCHIAL NAVIGATION;  Surgeon: Shelah Lamar RAMAN, MD;  Location: [facility] ENDOSCOPY;  Service: Pulmonary;  Laterality: N/A;     Social History:   reports that she quit smoking about 12 years ago. Her smoking use included cigarettes. She started smoking about 42 years ago. She has a 75 pack-year smoking history. She has never used smokeless tobacco. She reports that she does not drink alcohol and does not use drugs.   Family History:  Her family history includes Anxiety disorder in her mother; Arthritis in her sister; COPD in her mother; Heart disease in her father. There is no history of Colon cancer.   Allergies Allergies[1]   Home Medications  Prior to Admission  medications  Medication Sig Start Date End Date Taking? Authorizing Provider  albuterol  (VENTOLIN  HFA) 108 (90 Base) MCG/ACT inhaler Inhale 2 puffs into the lungs every 4 (four) hours as needed for wheezing or shortness of breath. 11/27/21   Johnson, Clanford L, MD  alendronate (FOSAMAX) 70 MG tablet Take 70 mg by mouth once a week. 09/10/22   [provider]  ALPRAZolam  (XANAX ) 1 MG tablet Take 1 tablet (1 mg total) by mouth 3 (three) times daily as needed for anxiety. 11/27/21   Johnson, Clanford L, MD  atorvastatin  (LIPITOR) 20 MG tablet Take 1 tablet (20 mg total) by mouth daily. Restart on 04/03/22 04/03/22   Evonnie Lenis, MD  ipratropium-albuterol  (DUONEB) 0.5-2.5 (3) MG/3ML SOLN Take 3 mLs by nebulization 3 (three) times daily. And as needed for SOB 11/27/21   Johnson, Clanford L, MD  Multiple Vitamins-Minerals (CENTRUM SILVER PO) Take 1 tablet by mouth daily.    [provider]  mupirocin ointment (BACTROBAN) 2 % Apply 1 Application topically. 07/01/22   [provider]  omeprazole (PRILOSEC) 20 MG capsule Take 20 mg by mouth daily.    [provider]  OXYGEN Place 3 L into the nose continuous.    [provider]       CRITICAL CARE Lenny Drought, MD  [facility] Prefer epic messenger for cross cover needs   My critical care time: 40 minutes  Critical care time was exclusive of separately billable procedures and treating other patients.  Critical care was necessary to treat or prevent imminent or life-threatening deterioration.  Critical care was time spent personally by me on the following activities: development of treatment plan with patient and/or surrogate as well as nursing, discussions with consultants, evaluation of patient's response to treatment, examination of patient, obtaining history from patient or surrogate, ordering and performing treatments and interventions, ordering and review of laboratory studies,  ordering and review of radiographic studies, pulse oximetry, re-evaluation of patient's condition and participation in multidisciplinary rounds.      [1] No Known Allergies  "

## 2024-01-29 NOTE — Plan of Care (Signed)
 Extubated yesterday Seen and examined today No focal deficits MR brain unremarkable for acute process Overnight EEG-gen slow. No sz DC LTM Management of medical issues per primary team as you are Seizure like activity was likely something in the setting of severe hypoxia and hypercarbia and I do not see any seizure predisposition based on her studies, so  no need for AEDs. D/W Dr. Sharie Eligio Lav, MD

## 2024-01-30 DIAGNOSIS — J9602 Acute respiratory failure with hypercapnia: Secondary | ICD-10-CM

## 2024-01-30 DIAGNOSIS — J9621 Acute and chronic respiratory failure with hypoxia: Secondary | ICD-10-CM | POA: Diagnosis not present

## 2024-01-30 DIAGNOSIS — J9601 Acute respiratory failure with hypoxia: Secondary | ICD-10-CM

## 2024-01-30 DIAGNOSIS — J441 Chronic obstructive pulmonary disease with (acute) exacerbation: Secondary | ICD-10-CM | POA: Diagnosis not present

## 2024-01-30 DIAGNOSIS — L8990 Pressure ulcer of unspecified site, unspecified stage: Secondary | ICD-10-CM | POA: Insufficient documentation

## 2024-01-30 DIAGNOSIS — J189 Pneumonia, unspecified organism: Secondary | ICD-10-CM | POA: Diagnosis not present

## 2024-01-30 DIAGNOSIS — J9622 Acute and chronic respiratory failure with hypercapnia: Secondary | ICD-10-CM | POA: Diagnosis not present

## 2024-01-30 LAB — BASIC METABOLIC PANEL WITH GFR
Anion gap: 5 (ref 5–15)
BUN: 19 mg/dL (ref 8–23)
CO2: 41 mmol/L — ABNORMAL HIGH (ref 22–32)
Calcium: 8.9 mg/dL (ref 8.9–10.3)
Chloride: 98 mmol/L (ref 98–111)
Creatinine, Ser: 0.37 mg/dL — ABNORMAL LOW (ref 0.44–1.00)
GFR, Estimated: >60 mL/min
Glucose, Bld: 96 mg/dL (ref 70–99)
Potassium: 3.7 mmol/L (ref 3.5–5.1)
Sodium: 145 mmol/L (ref 135–145)

## 2024-01-30 LAB — BLOOD GAS, ARTERIAL
Acid-Base Excess: 14.2 mmol/L — ABNORMAL HIGH (ref 0.0–2.0)
Acid-Base Excess: 17.6 mmol/L — ABNORMAL HIGH (ref 0.0–2.0)
Bicarbonate: 48.8 mmol/L — ABNORMAL HIGH (ref 20.0–28.0)
Bicarbonate: 50.1 mmol/L — ABNORMAL HIGH (ref 20.0–28.0)
Drawn by: 22766
O2 Saturation: 100 %
O2 Saturation: 95.6 %
Patient temperature: 37.1
Patient temperature: 36.4
pCO2 arterial: >123 mmHg (ref 32–48)
pCO2 arterial: 106 mmHg (ref 32–48)
pH, Arterial: 7.16 — CL (ref 7.35–7.45)
pH, Arterial: 7.28 — ABNORMAL LOW (ref 7.35–7.45)
pO2, Arterial: 300 mmHg — ABNORMAL HIGH (ref 83–108)
pO2, Arterial: 63 mmHg — ABNORMAL LOW (ref 83–108)

## 2024-01-30 LAB — CBC
HCT: 36.7 % (ref 36.0–46.0)
Hemoglobin: 11 g/dL — ABNORMAL LOW (ref 12.0–15.0)
MCH: 30.5 pg (ref 26.0–34.0)
MCHC: 30 g/dL (ref 30.0–36.0)
MCV: 101.7 fL — ABNORMAL HIGH (ref 80.0–100.0)
Platelets: 124 10*3/uL — ABNORMAL LOW (ref 150–400)
RBC: 3.61 MIL/uL — ABNORMAL LOW (ref 3.87–5.11)
RDW: 12.1 % (ref 11.5–15.5)
WBC: 11 10*3/uL — ABNORMAL HIGH (ref 4.0–10.5)
nRBC: 0 % (ref 0.0–0.2)

## 2024-01-30 LAB — PHOSPHORUS: Phosphorus: 2 mg/dL — ABNORMAL LOW (ref 2.5–4.6)

## 2024-01-30 LAB — GLUCOSE, CAPILLARY
Glucose-Capillary: 184 mg/dL — ABNORMAL HIGH (ref 70–99)
Glucose-Capillary: 128 mg/dL — ABNORMAL HIGH (ref 70–99)

## 2024-01-30 LAB — MAGNESIUM: Magnesium: 1.8 mg/dL (ref 1.7–2.4)

## 2024-01-30 MED ORDER — ROCURONIUM BROMIDE 10 MG/ML (PF) SYRINGE
PREFILLED_SYRINGE | INTRAVENOUS | Status: AC
  Filled 2024-01-30: qty 10

## 2024-01-30 MED ORDER — LACTATED RINGERS IV SOLN: INTRAVENOUS | Status: DC

## 2024-01-30 MED ORDER — LEVETIRACETAM (KEPPRA) 500 MG/5 ML ADULT IV PUSH: 500.0000 mg | Freq: Two times a day (BID) | INTRAVENOUS | Status: DC

## 2024-01-30 MED ORDER — LEVETIRACETAM (KEPPRA) 500 MG/5 ML ADULT IV PUSH: 1000.0000 mg | Freq: Once | INTRAVENOUS | Status: DC

## 2024-01-30 MED ORDER — SUCCINYLCHOLINE CHLORIDE 200 MG/10ML IV SOSY
PREFILLED_SYRINGE | INTRAVENOUS | Status: AC
  Filled 2024-01-30: qty 10

## 2024-01-30 MED ORDER — METHYLPREDNISOLONE SODIUM SUCC 125 MG IJ SOLR: 60.0000 mg | Freq: Two times a day (BID) | INTRAMUSCULAR | Status: DC

## 2024-01-30 MED ORDER — METHYLPREDNISOLONE SODIUM SUCC 40 MG IJ SOLR
40.0000 mg | Freq: Two times a day (BID) | INTRAMUSCULAR | Status: DC
  Administered 2024-01-30 – 2024-01-31 (×2): 40 mg via INTRAVENOUS
  Filled 2024-01-30 (×2): qty 1

## 2024-01-30 MED ORDER — MAGNESIUM SULFATE 2 GM/50ML IV SOLN
2.0000 g | Freq: Once | INTRAVENOUS | Status: AC
  Administered 2024-01-30: 2 g via INTRAVENOUS
  Filled 2024-01-30: qty 50

## 2024-01-30 MED ORDER — SODIUM PHOSPHATES 45 MMOLE/15ML IV SOLN
15.0000 mmol | Freq: Once | INTRAVENOUS | Status: AC
  Administered 2024-01-30: 15 mmol via INTRAVENOUS
  Filled 2024-01-30: qty 5

## 2024-01-30 MED ORDER — ETOMIDATE 2 MG/ML IV SOLN
INTRAVENOUS | Status: AC
  Filled 2024-01-30: qty 20

## 2024-01-30 NOTE — Progress Notes (Signed)
 The patient is on BiPAP 12/10/38% with RR 15 for 1:30 HR:MIN, repeat ABG 7.28/106/63/96%. She is breathing RR 20. No auto-PEEP. HOB is elevated.   P/E: Her V/S are stable. SpO2 90%.  More responsive now to painful stimuli, withdraws to pain PERL No JVD Lungs: poor air entry bilaterally. No wheezing Heart: NL S1/S2, no m/g/r Abd: no distension LL: trace edema  Labs, CT chest, notes, meds: reviewed  Assessment: Acute on chronic hypoxic hypercapnic resp failure ECOPD, on HOT 4 L [place] (very severe COPD with FEV1 28%) PNA, mainly LLL Anxiety Anemia of chronic illness Chronic thrombocytopenia Moderate PCM Hx of Lung Ca Dyslipidemia Metabolic encephalopathy due to hypercapnia and PNA [place]  PLAN: Continue NIV, but increase to 22. ABG @ 11 pm NPO LR AM labs  D/w RT and RN  CCM 40 min

## 2024-01-30 NOTE — Progress Notes (Signed)
 The nurse responded to the patient's bed alarm and discovered the patient unresponsive, cyanotic, and lying on the floor. Oxygen saturation was 24%. A rapid response team was called, and a code blue was initiated to address the airway. The physician was notified and arrived at the bedside. The patient was then transferred to [facility] 24 ICU.

## 2024-01-30 NOTE — Progress Notes (Signed)
" °  Progress Note   Patient: Donna Watkins FMW:[id number] DOB: 1960-12-12 DOA: 01/27/2024     3 DOS: the patient was seen and examined on 01/30/2024 at 10:15AM      Brief hospital course: 64 y.o. F with COPD and CRF on 4L home O2, FEV1 28% in 2018, hx squamous lung CA T1c s/p radiation in 2024, in remission, hx Chiari 1 malformation, anxiety on daily Xanax  and malnutrition who presented with SOB and seizure-like activity.  Intubated in the [facility] ER and transferred to [facility] ICU.  Neurology consulted, LTM EEG negative.   Significant events: 1/22: Intubated and admitted to [facility] ICU from [facility] ER; Neurology consulted 1/23: Extubated 1/24: LTM EEG without epileptiform activity, Neurology doubt seizures, transferred OOU         Assessment and Plan: Acute on chronic hypoxic hypercapnic respiratory failure due to acute COPD exacerbation Suspected pneumonia - Continue Solu-Medrol  - Continue bronchodilators, Brovana , Pulmicort , and Yupelri  - Continue Rocephin    Acute metabolic encephalopathy Due to respiratory failure, likely alprazolam  withdrawal also  Seizure-like activity at presentation Evaluated by neurology.  Brain MRI normal.  Long-term EEG showed no epileptiform activity.  Neurology suspect this was myoclonic jerking in the setting of hypoxia, not seizure.  Chiari I malformation   Hypernatremia Resolved with fluids  Concern for benzodiazepine withdrawal Anxiety Takes Xanax  1 mg five times daily at home. Here, transitioned to Ativan  Q6hrs -Continue lorazepam   Moderate protein calorie malnutrition -Continue Ensure  Sacrum pressure injury, stage I present on admission -Sacral foam - Every 2 turns  Hyperlipidemia - Hold Lipitor  Thrombocytopenia Mild, improving           Subjective: Patient is asking when she can go home.  She appears severely debilitated.  She is extubated 2 days ago and transferred out of ICU yesterday.  She has not been able to get up  yet.     Physical Exam: BP (!) 161/81 (BP Location: Left Arm)   Pulse (!) 116   Temp 98.7 F (37.1 C) (Oral)   Resp 17   Ht 5' 3 (1.6 m)   Wt 46.1 kg   LMP 05/06/2010   SpO2 95%   BMI 18.00 kg/m   Elderly adult female, lying in bed, appears weak and tired RRR, no murmurs, no peripheral edema Nasal cannula in place, respiratory effort weak, lung sounds extremely diminished, no wheezing appreciated Abdomen soft, no tenderness palpation or guarding, no ascites or distention Attention normal, affect normal, judgment and insight appear normal, severe generalized weakness    Data Reviewed: Basic metabolic panel shows elevated bicarb, creatinine low due to malnutrition CBC shows resolved thrombocytopenia, mild anemia Family Communication: Sister by phone    Disposition: Status is: Inpatient         Author: Lonni SHAUNNA Dalton, MD 01/30/2024 12:12 PM  For on call review [email or web address].    "

## 2024-01-30 NOTE — Progress Notes (Signed)
 ABG collected and sent to lab, lab called and informed

## 2024-01-30 NOTE — Progress Notes (Signed)
 "  NAME:  Donna Watkins, MRN:  [id number], DOB:  1960-01-21, LOS: 3 ADMISSION DATE:  01/27/2024, CONSULTATION DATE:  01/27/24 REFERRING MD:  Towana , CHIEF COMPLAINT:  AMS  History of Present Illness:   64 yo F PMH copd, chronic hypoxia on 4L, anxiety,chronic sacral wound, hx SCC lung s.p radiation,  who presented to [facility] ED 01/27/24 via EMS for AMS. Apparently family tried to awaken patient and reported sz like activity. She was hypoxic sats in 70s on her home 4L with EMS. EtCO2 was also high w EMS. In ED she was short of breath and altered. She was reportedly following commands. PCCM was called for transfer to [facility] & accepted in this setting  Prior to transfer, the patient apparently had a seizure vs sz like activity in CT & was intubated. It looks like she was given 1g keppra     Pertinent  Medical History  COPD Chronic hypoxic and hypercarbic respiratory failure  SCC lung s/p XRT  Significant Hospital Events: Including procedures, antibiotic start and stop dates in addition to other pertinent events   1/22 ?sz like activity at [facility]. Intubated. Txf to [facility]  1/24 txf out of ICU 1/25 rapid response: unresponsive after pt removed her own oxygen. Bagged w transient improvement in mentation then became minimally responsive again. Txf to ICU   Interim History / Subjective:  Rapid response  -pt unresponsive found slumped over w/o O2 for unclear amount of time. Bagged w improvement in mentation. Over time, mental status again declined.   ABG w Resp acidosis (on chronically compensated hypercarbia)    Objective    Blood pressure 139/77, pulse (!) 102, temperature 98.7 F (37.1 C), temperature source Oral, resp. rate 16, height 5' 3 (1.6 m), weight 46.1 kg, last menstrual period 05/06/2010, SpO2 100%.        Intake/Output Summary (Last 24 hours) at 01/30/2024 1914 Last data filed at 01/30/2024 1238 Gross per 24 hour  Intake 417 ml  Output 250 ml  Net 167 ml   Filed Weights   01/28/24  0452 01/29/24 0412 01/30/24 0500  Weight: 50.3 kg 48.4 kg 46.1 kg    Examination: General: Chronically and critically ill F  HEENT: poor dentition. Temporal muscle wasting. Tacky mm  Lungs: symmetrical chest expansion, poor air movement. 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 ativan  -- can revisit when mentation improves    AoC hypoxic and hypercarbic resp failure AECOPD End stage COPD  P -txf to ICU -will try salvage BiPAP while we wait for ICU bed, would base  decision to intubate on her clinical picture instead of her gas. She has been minimally responsive for some time, and even if gas were slightly improving if her LOC is comparable to present I would rec ETT  -incr steroids -2g mag empirically but doubt this will have great benefit -cont nebs   Chiari I malformation, chronic and stable  -no acute intervention   Mod kcal protein malnutrition -if intubated, start EN  -AM BMP   Pressure injury POA -wound  care   Thrombocytopenia, improving -follow PRN    TRH has discussed status change w family They are ok w reintubation if indicated   Labs   CBC: Recent Labs  Lab 01/27/24 1014 01/27/24 1454 01/27/24 1540 01/28/24 0348 01/29/24 0504 01/30/24 0546  WBC 8.3 3.7*  --  5.6 10.4 11.0*  NEUTROABS 7.7  --   --   --   --   --   HGB 12.8 10.3* 10.9* 9.4* 10.3* 11.0*  HCT 46.1* 35.7* 32.0* 32.2* 34.0* 36.7  MCV 108.5* 106.6*  --  105.2* 101.2* 101.7*  PLT 113* 66*  --  74* 89* 124*    Basic Metabolic Panel: Recent Labs  Lab 01/27/24 1014 01/27/24 1454 01/27/24 1540  01/27/24 2040 01/28/24 0348 01/29/24 0504 01/30/24 0546  NA 147*  --  145 145 140 139 145  K 5.1  --  4.5 3.8 3.8 4.1 3.7  CL 90*  --   --  96* 99 99 98  CO2 >45*  --   --  35* 33* 32 41*  GLUCOSE 170*  --   --  111* 108* 88 96  BUN 20  --   --  20 18 15 19   CREATININE 0.35* 0.37*  --  0.51 0.54 0.37* 0.37*  CALCIUM  9.4  --   --  8.5* 8.3* 9.0 8.9  MG  --   --   --  2.4  --  1.7 1.8  PHOS  --   --   --   --   --  2.2* 2.0*   GFR: Estimated Creatinine Clearance: 53.1 mL/min (A) (by C-G formula based on SCr of 0.37 mg/dL (L)). Recent Labs  Lab 01/27/24 1015 01/27/24 1454 01/28/24 0348 01/29/24 0504 01/30/24 0546  WBC  --  3.7* 5.6 10.4 11.0*  LATICACIDVEN 0.6  --   --   --   --     Liver Function Tests: Recent Labs  Lab 01/27/24 1014  AST 22  ALT 16  ALKPHOS 83  BILITOT 0.7  PROT 7.1  ALBUMIN 4.4   No results for input(s): LIPASE, AMYLASE in the last 168 hours. Recent Labs  Lab 01/27/24 1019  AMMONIA 32    ABG    Component Value Date/Time   PHART 7.16 (LL) 01/30/2024 1815   PCO2ART >123 (HH) 01/30/2024 1815   PO2ART 300 (H) 01/30/2024 1815   HCO3 48.8 (H) 01/30/2024 1815   TCO2 >50 (H) 01/27/2024 1540   O2SAT 100 01/30/2024 1815     Coagulation Profile: No results for input(s): INR, PROTIME in the last 168 hours.  Cardiac Enzymes: No results for input(s): CKTOTAL, CKMB, CKMBINDEX, TROPONINI in the last 168 hours.  HbA1C: Hgb A1c MFr Bld  Date/Time Value Ref Range Status  05/12/2022 03:13 PM 5.4 4.8 - 5.6 % Final    Comment:             Prediabetes: 5.7 - 6.4          Diabetes: >6.4          Glycemic control for adults with diabetes: <7.0     CBG: Recent Labs  Lab 01/27/24 2317 01/28/24 0349 01/28/24 0753 01/28/24 1123 01/30/24 1745  GLUCAP 95 95 86 126* 184*    CRITICAL CARE Performed by: Ronnald FORBES Gave   Total critical care time: 36 minutes  Critical care time was exclusive of separately billable procedures and  treating other patients. Critical care was necessary to treat or prevent imminent or  life-threatening deterioration.  Critical care was time spent personally by me on the following activities: development of treatment plan with patient and/or surrogate as well as nursing, discussions with consultants, evaluation of patient's response to treatment, examination of patient, obtaining history from patient or surrogate, ordering and performing treatments and interventions, ordering and review of laboratory studies, ordering and review of radiographic studies, pulse oximetry and re-evaluation of patient's condition.  Ronnald Gave MSN, AGACNP-BC [facility] Amion for pager 01/30/2024, 7:14 PM   "

## 2024-01-30 NOTE — Hospital Course (Signed)
 64 y.o. F with COPD and CRF on 4L home O2, FEV1 28% in 2018, hx squamous lung CA T1c s/p radiation in 2024, in remission, hx Chiari 1 malformation, anxiety on daily Xanax  and malnutrition who presented with SOB and seizure-like activity.  Intubated in the [facility] ER and transferred to [facility] ICU.  Neurology consulted, LTM EEG negative.   Significant events: 1/22: Intubated and admitted to [facility] ICU from [facility] ER; Neurology consulted 1/23: Extubated 1/24: LTM EEG without epileptiform activity, Neurology doubt seizures, transferred OOU 1/25: Became encephalopathic in afternoon, found on floor with O2 off, pCO2 >120, transferred back to ICU on BiPAP 1/26: Mentation resolved after BiPAP overnight, transferred back OOU 1/27: Stable, pending placement

## 2024-01-30 NOTE — Significant Event (Addendum)
 Rapid Response Event Note   Reason for Call :  SpO2 24% on room air. Patient had taken her oxygen off and gotten out of bed. Staff found patient in the corner of the room, blue at the lips. Code blue called for airway. Patient was bagged via 100% BVM and patient oxygen saturation and mentation improved. Code blue event cleared by myself.   Initial Focused Assessment:  Patient lying in bed, answering questions appropriately. Breathing regular, unlabored. Clear breath sounds. Skin is warm, dry, pink.  VS: T 98.81F, BP 139/77, HR 102, RR 16, SpO2 100% on NRB CBG: 184  Interventions:  -ABG 7.16/ >123 / 300 / 48.8 -BiPAP per PCCM  Plan of Care:  -Transfer to ICU  Event Summary:  MD Notified: Dr. Jonel Call Time: 1747 Arrival Time: 1749 End Time: 1904  Leonor LITTIE Danker, RN

## 2024-01-30 NOTE — Progress Notes (Signed)
 Rt was called to place patient on Bipap due to being obtunded and Rapid wanted patient to be placed on Bipap for an hr and next step to intubate patient. Patients Bipap settings is placed on NIV PC+ of 15/5,15RR, 40%. Nurse is at bedside

## 2024-01-30 NOTE — Progress Notes (Signed)
 RT transferred patient from 2W 39 to 2H 24

## 2024-01-30 NOTE — Progress Notes (Signed)
 Called to bedside for rapid response.  Patient had been found in the corner with oxygen off, bed alarm brought staff to the room.  She was initially poorly responsive, bag masked and became more responsive, following commands and making eye contact.    Subsequently became slowly lethargic again.  Staff were with her, no seizure-like activity noted.  ABG showed pH 7.1 and CO2 >120.    CCM consulted, transfer to ICU.   Plan for initially BiPAP in ICU.

## 2024-01-31 DIAGNOSIS — L8990 Pressure ulcer of unspecified site, unspecified stage: Secondary | ICD-10-CM

## 2024-01-31 DIAGNOSIS — I82409 Acute embolism and thrombosis of unspecified deep veins of unspecified lower extremity: Secondary | ICD-10-CM | POA: Diagnosis not present

## 2024-01-31 DIAGNOSIS — E785 Hyperlipidemia, unspecified: Secondary | ICD-10-CM

## 2024-01-31 DIAGNOSIS — F419 Anxiety disorder, unspecified: Secondary | ICD-10-CM | POA: Diagnosis not present

## 2024-01-31 DIAGNOSIS — K219 Gastro-esophageal reflux disease without esophagitis: Secondary | ICD-10-CM

## 2024-01-31 DIAGNOSIS — D696 Thrombocytopenia, unspecified: Secondary | ICD-10-CM | POA: Diagnosis not present

## 2024-01-31 DIAGNOSIS — J441 Chronic obstructive pulmonary disease with (acute) exacerbation: Secondary | ICD-10-CM | POA: Diagnosis not present

## 2024-01-31 DIAGNOSIS — E43 Unspecified severe protein-calorie malnutrition: Secondary | ICD-10-CM | POA: Diagnosis not present

## 2024-01-31 DIAGNOSIS — J9621 Acute and chronic respiratory failure with hypoxia: Secondary | ICD-10-CM | POA: Diagnosis not present

## 2024-01-31 DIAGNOSIS — J9622 Acute and chronic respiratory failure with hypercapnia: Secondary | ICD-10-CM | POA: Diagnosis not present

## 2024-01-31 DIAGNOSIS — G935 Compression of brain: Secondary | ICD-10-CM

## 2024-01-31 DIAGNOSIS — G9341 Metabolic encephalopathy: Secondary | ICD-10-CM | POA: Diagnosis not present

## 2024-01-31 LAB — POCT I-STAT 7, (LYTES, BLD GAS, ICA,H+H)
Acid-Base Excess: 17 mmol/L — ABNORMAL HIGH (ref 0.0–2.0)
Bicarbonate: 46.3 mmol/L — ABNORMAL HIGH (ref 20.0–28.0)
Calcium, Ion: 1.17 mmol/L (ref 1.15–1.40)
HCT: 34 % — ABNORMAL LOW (ref 36.0–46.0)
Hemoglobin: 11.6 g/dL — ABNORMAL LOW (ref 12.0–15.0)
O2 Saturation: 95 %
Patient temperature: 98.8
Potassium: 4 mmol/L (ref 3.5–5.1)
Sodium: 143 mmol/L (ref 135–145)
TCO2: 49 mmol/L — ABNORMAL HIGH (ref 22–32)
pCO2 arterial: 86 mmHg (ref 32–48)
pH, Arterial: 7.34 — ABNORMAL LOW (ref 7.35–7.45)
pO2, Arterial: 85 mmHg (ref 83–108)

## 2024-01-31 LAB — BASIC METABOLIC PANEL WITH GFR
Anion gap: 7 (ref 5–15)
BUN: 23 mg/dL (ref 8–23)
CO2: 43 mmol/L — ABNORMAL HIGH (ref 22–32)
Calcium: 8.5 mg/dL — ABNORMAL LOW (ref 8.9–10.3)
Chloride: 95 mmol/L — ABNORMAL LOW (ref 98–111)
Creatinine, Ser: 0.37 mg/dL — ABNORMAL LOW (ref 0.44–1.00)
GFR, Estimated: >60 mL/min
Glucose, Bld: 158 mg/dL — ABNORMAL HIGH (ref 70–99)
Potassium: 4 mmol/L (ref 3.5–5.1)
Sodium: 144 mmol/L (ref 135–145)

## 2024-01-31 LAB — GLUCOSE, CAPILLARY: Glucose-Capillary: 148 mg/dL — ABNORMAL HIGH (ref 70–99)

## 2024-01-31 LAB — CBC
HCT: 36.6 % (ref 36.0–46.0)
Hemoglobin: 10.8 g/dL — ABNORMAL LOW (ref 12.0–15.0)
MCH: 30.1 pg (ref 26.0–34.0)
MCHC: 29.5 g/dL — ABNORMAL LOW (ref 30.0–36.0)
MCV: 101.9 fL — ABNORMAL HIGH (ref 80.0–100.0)
Platelets: 105 10*3/uL — ABNORMAL LOW (ref 150–400)
RBC: 3.59 MIL/uL — ABNORMAL LOW (ref 3.87–5.11)
RDW: 12 % (ref 11.5–15.5)
WBC: 6.1 10*3/uL (ref 4.0–10.5)
nRBC: 0 % (ref 0.0–0.2)

## 2024-01-31 LAB — PHOSPHORUS: Phosphorus: 2.3 mg/dL — ABNORMAL LOW (ref 2.5–4.6)

## 2024-01-31 LAB — CULTURE, RESPIRATORY W GRAM STAIN: Culture: NORMAL

## 2024-01-31 LAB — HEPATIC FUNCTION PANEL
ALT: 21 U/L (ref 0–44)
AST: 26 U/L (ref 15–41)
Albumin: 3.6 g/dL (ref 3.5–5.0)
Alkaline Phosphatase: 62 U/L (ref 38–126)
Bilirubin, Direct: 0.1 mg/dL (ref 0.0–0.2)
Indirect Bilirubin: 0.2 mg/dL — ABNORMAL LOW (ref 0.3–0.9)
Total Bilirubin: 0.3 mg/dL (ref 0.0–1.2)
Total Protein: 5.9 g/dL — ABNORMAL LOW (ref 6.5–8.1)

## 2024-01-31 LAB — MAGNESIUM: Magnesium: 2.3 mg/dL (ref 1.7–2.4)

## 2024-01-31 MED ORDER — SODIUM PHOSPHATES 45 MMOLE/15ML IV SOLN
15.0000 mmol | Freq: Once | INTRAVENOUS | Status: AC
  Administered 2024-01-31: 15 mmol via INTRAVENOUS
  Filled 2024-01-31: qty 5

## 2024-01-31 MED ORDER — HYDROXYZINE HCL 25 MG PO TABS
25.0000 mg | ORAL_TABLET | Freq: Three times a day (TID) | ORAL | Status: DC | PRN
  Administered 2024-01-31 – 2024-02-01 (×4): 25 mg via ORAL
  Filled 2024-01-31 (×4): qty 1

## 2024-01-31 MED ORDER — SERTRALINE HCL 50 MG PO TABS
50.0000 mg | ORAL_TABLET | Freq: Every day | ORAL | Status: DC
  Administered 2024-01-31 – 2024-02-02 (×3): 50 mg via ORAL
  Filled 2024-01-31 (×3): qty 1

## 2024-01-31 MED ORDER — MIRTAZAPINE 15 MG PO TABS
15.0000 mg | ORAL_TABLET | Freq: Every day | ORAL | Status: DC
  Administered 2024-01-31 – 2024-02-04 (×5): 15 mg via ORAL
  Filled 2024-01-31 (×5): qty 1

## 2024-01-31 MED ORDER — BUSPIRONE HCL 10 MG PO TABS: 5.0000 mg | ORAL_TABLET | Freq: Two times a day (BID) | ORAL | Status: DC

## 2024-01-31 MED ORDER — ATORVASTATIN CALCIUM 10 MG PO TABS
20.0000 mg | ORAL_TABLET | Freq: Every day | ORAL | Status: DC
  Administered 2024-01-31 – 2024-02-04 (×5): 20 mg via ORAL
  Filled 2024-01-31 (×5): qty 2

## 2024-01-31 MED ORDER — HYDROXYZINE HCL 50 MG/ML IM SOLN: 25.0000 mg | Freq: Three times a day (TID) | INTRAMUSCULAR | Status: DC | PRN

## 2024-01-31 NOTE — Progress Notes (Signed)
 "  NAME:  Donna Watkins, MRN:  [id number], DOB:  1960/08/18, LOS: 4 ADMISSION DATE:  01/27/2024, CONSULTATION DATE:  01/27/24 REFERRING MD:  Towana , CHIEF COMPLAINT:  AMS  History of Present Illness:   64 yo F PMH copd, chronic hypoxia on 4L, anxiety,chronic sacral wound, hx SCC lung s.p radiation,  who presented to [facility] ED 01/27/24 via EMS for AMS. Apparently family tried to awaken patient and reported sz like activity. She was hypoxic sats in 70s on her home 4L with EMS. EtCO2 was also high w EMS. In ED she was short of breath and altered. She was reportedly following commands. PCCM was called for transfer to [facility] & accepted in this setting  Prior to transfer, the patient apparently had a seizure vs sz like activity in CT & was intubated. It looks like she was given 1g keppra     Pertinent  Medical History  COPD Chronic hypoxic and hypercarbic respiratory failure  SCC lung s/p XRT  Significant Hospital Events: Including procedures, antibiotic start and stop dates in addition to other pertinent events   1/22 ?sz like activity at [facility]. Intubated. Txf to [facility]  1/24 txf out of ICU 1/25 rapid response: unresponsive after pt removed her own oxygen. Bagged w transient improvement in mentation then became minimally responsive again. Txf to ICU   Interim History / Subjective:  Able to be transitioned off bipap overnight. Has chronic anxiety, reports she will need something for anxiety.    Objective    Blood pressure 132/71, pulse 87, temperature 98.4 F (36.9 C), temperature source Oral, resp. rate 19, height 5' 3 (1.6 m), weight 44.9 kg, last menstrual period 05/06/2010, SpO2 92%.    Vent Mode: BIPAP FiO2 (%):  [40 %] 40 % Set Rate:  [15 bmp] 15 bmp PEEP:  [5 cmH20] 5 cmH20 Pressure Support:  [10 cmH20] 10 cmH20   Intake/Output Summary (Last 24 hours) at 01/31/2024 1106 Last data filed at 01/31/2024 0600 Gross per 24 hour  Intake 679.52 ml  Output 350 ml  Net 329.52 ml   Filed Weights    01/30/24 0500 01/30/24 1915 01/31/24 0500  Weight: 46.1 kg 44.9 kg 44.9 kg    Examination: General: frail, chronically ill appearing woman lying in bed in NAD HEENT: [place]/AT  Lungs: no wheezing, breathing comfortably on [place] CVS: S1S2, RRR  Abdomen: soft, NT Neuro: awake, alert, answering questions appropriately   7.34/86/85/46 BUN 23 Cr 0.37 WBC 6.1 H/H 10.8/36/6 Platelets 105  Resolved problem list   Assessment and Plan   Acute metabolic encephalopathy due to acute on chronic hypercapnia Seizures ruled out  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 No wheezing, rapidly improved with bipap suggests this was most likely due to respiratory drive. -Had a discussion with her that she is going to need non-benzodiazepine medications and methods to control her anxiety. Suggest using SSRI, antipsychotics, buspar  for anxiety. Would entirely avoid benzos unless palliative care is her intention.  -we discussed non pharmacologic stress reduction techniques that may help -scheduled sertraline  -remeron  dialy -hydroxyzine  Q8h PRN for anxiety -can use IV haldol  for severe uncontrolled anxiety  AoC hypoxic and hypercarbic respiratory failure AECOPD End stage COPD  -daily steroids -con't triple nebs -long-term she likely needs hospice; palliative care consulted  Chiari I malformation, chronic and stable  -no intervention required  Severe protein energy malnutrition -reg diet -encourage PO intake  -remeron  hopefully will stimulate appetite  Pressure injury POA -wound care  Thrombocytopenia, improving -no need for transfusion currently  Deconditioning -PT, OT -OOB mobility  H/o GERD -con't PTA PPI  HLD  -statin   DVT: heparin  [place]  Stable to transfer back out of ICU. TRH to reassume care tomorrow.   Labs    CBC: Recent Labs  Lab 01/27/24 1014 01/27/24 1454 01/27/24 1540 01/28/24 0348 01/29/24 0504 01/30/24 0546 01/30/24 2354 01/31/24 0056  WBC 8.3 3.7*  --  5.6 10.4 11.0*  --  6.1  NEUTROABS 7.7  --   --   --   --   --   --   --   HGB 12.8 10.3*   < > 9.4* 10.3* 11.0* 11.6* 10.8*  HCT 46.1* 35.7*   < > 32.2* 34.0* 36.7 34.0* 36.6  MCV 108.5* 106.6*  --  105.2* 101.2* 101.7*  --  101.9*  PLT 113* 66*  --  74* 89* 124*  --  105*   < > = values in this interval not displayed.    Basic Metabolic Panel: Recent Labs  Lab 01/27/24 2040 01/28/24 0348 01/29/24 0504 01/30/24 0546 01/30/24 2354 01/31/24 0056  NA 145 140 139 145 143 144  K 3.8 3.8 4.1 3.7 4.0 4.0  CL 96* 99 99 98  --  95*  CO2 35* 33* 32 41*  --  43*  GLUCOSE 111* 108* 88 96  --  158*  BUN 20 18 15 19   --  23  CREATININE 0.51 0.54 0.37* 0.37*  --  0.37*  CALCIUM  8.5* 8.3* 9.0 8.9  --  8.5*  MG 2.4  --  1.7 1.8  --  2.3  PHOS  --   --  2.2* 2.0*  --  2.3*   GFR: Estimated Creatinine Clearance: 51.7 mL/min (A) (by C-G formula based on SCr of 0.37 mg/dL (L)). Recent Labs  Lab 01/27/24 1015 01/27/24 1454 01/28/24 0348 01/29/24 0504 01/30/24 0546 01/31/24 0056  WBC  --    < > 5.6 10.4 11.0* 6.1  LATICACIDVEN 0.6  --   --   --   --   --    < > = values in this interval not displayed.    Liver Function Tests: Recent Labs  Lab 01/27/24 1014 01/31/24 0056  AST 22 26  ALT 16 21  ALKPHOS 83 62  BILITOT 0.7 0.3  PROT 7.1 5.9*  ALBUMIN 4.4 3.6     Leita SHAUNNA Gaskins, DO 01/31/24 12:14 PM [facility]  For contact information, see Amion. If no response to pager, please call PCCM 2H APP. After hours, 7PM- 7AM, please call on call APP for 2H.   "

## 2024-01-31 NOTE — Progress Notes (Signed)
 Pt arrived to 3E, oriented to unit, and CCMD notified. BP (!) 158/70 (BP Location: Right Arm)   Pulse 70   Temp 99.2 F (37.3 C) (Oral)   Resp (!) 24   Ht 5' 3 (1.6 m)   Wt 44.9 kg   LMP 05/06/2010   SpO2 96%   BMI 17.53 kg/m

## 2024-01-31 NOTE — Evaluation (Signed)
 Physical Therapy Evaluation Patient Details Name: Donna Watkins MRN: [id number] DOB: May 19, 1960 Today's Date: 01/31/2024  History of Present Illness  Pt is 64 yo presenting to [facility] on 1/22 due to pt was transferred from [facility] ED via EMS for AMS. Pt was short of breathe in ED with altered mental status with seizure vs. Seizure like activity in CT and was intubated. Pt was transferred out of the ICU on 1/24 and then back to ICU on 1/25 due to unresponsive with significant increase in CO2 levels. Pt was transitioned off BIBPAP over night of 1/25. PMH: chronic hypoxia on 4L O2 at home, anxiety, chronic sacral wound, hx SCC lunc s/p radiation.  Clinical Impression  Pt is currently presenting with significant impulsive inclinations. Pt is supervision for bed mobility, CGA to Min A for sit to stand and gait without an AD. Pt had multiple instances of posterior LOB with gait requiring Min A to prevent falls. Pt got extremely fatigued after 40 ft of gait and continued to try to ambulate despite significant slowing down, knees beginning to flex, increased respirations and required heavy multi modal cues to step back and sit in wheel chair to rest. Pt is very unsafe and at a high risk for falls. May progress to home depending on level of assist available but pt was unable to state this today. Due to pt current functional status, home set up and available assistance at home recommending skilled physical therapy services < 3 hours/day in order to address strength, balance and functional mobility to decrease risk for falls, injury, immobility, skin break down and re-hospitalization.          If plan is discharge home, recommend the following: A little help with walking and/or transfers;Assist for transportation;Assistance with cooking/housework;Supervision due to cognitive status;Help with stairs or ramp for entrance   Can travel by private vehicle   No    Equipment Recommendations None  recommended by PT     Functional Status Assessment Patient has had a recent decline in their functional status and demonstrates the ability to make significant improvements in function in a reasonable and predictable amount of time.     Precautions / Restrictions Precautions Precautions: Fall Recall of Precautions/Restrictions: Impaired Restrictions Weight Bearing Restrictions Per Provider Order: No      Mobility  Bed Mobility Overal bed mobility: Needs Assistance Bed Mobility: Supine to Sit     Supine to sit: Supervision     General bed mobility comments: supervision for safety, pt trying to get up around bed rails    Transfers Overall transfer level: Needs assistance Equipment used: None Transfers: Sit to/from Stand Sit to Stand: Contact guard assist, Min assist           General transfer comment: pt reporting that she does not need an assistive device, intermittent LOB posteriorly requiring MIn A to maintain balance.    Ambulation/Gait Ambulation/Gait assistance: Contact guard assist, Min assist, +2 physical assistance, +2 safety/equipment Gait Distance (Feet): 40 Feet Assistive device: None Gait Pattern/deviations: Step-through pattern, Drifts right/left, Decreased stride length Gait velocity: decreased Gait velocity interpretation: <1.31 ft/sec, indicative of household ambulator   General Gait Details: short step length, drifting R/L with intermittent posterior LOB requiring Min A to maintain upright posture. +2 chair follow pt got very fatigued and had to sit prior to returning with significant difficulty stepping back to sit in W/C     Balance Overall balance assessment: Needs assistance Sitting-balance support: Bilateral upper extremity supported, Feet  supported Sitting balance-Leahy Scale: Fair     Standing balance support: No upper extremity supported, During functional activity Standing balance-Leahy Scale: Poor Standing balance comment: multiple  instances of posterior LOB requiring MIn A to maintain upright posture.       Pertinent Vitals/Pain Pain Assessment Pain Assessment: No/denies pain    Home Living Family/patient expects to be discharged to:: Private residence Living Arrangements: Other relatives (lives with her sister) Available Help at Discharge: Family;Available 24 hours/day Type of Home: House Home Access: Stairs to enter Entrance Stairs-Rails: Right;Left;Can reach both Entrance Stairs-Number of Steps: per previous note 4 steps in back with bil side rails, 4 steps in front without rails.   Home Layout: One level Home Equipment: Grab bars - tub/shower;Shower Counsellor (2 wheels);Wheelchair - manual Additional Comments: Pt is a poor historian did state she lived with her sister unclear on amount of steps, whether it was a house. Information was taken from previous note.    Prior Function Prior Level of Function : Needs assist;Patient poor historian/Family not available             Mobility Comments: Pt reporting that she drives and does not use AD at baseline. Per previous note pt has assist from family with ambulation. ADLs Comments: Pt reports ind but per previous note pt was requiring assistance with bathing, dressing and toileting.     Extremity/Trunk Assessment   Upper Extremity Assessment Upper Extremity Assessment: Generalized weakness    Lower Extremity Assessment Lower Extremity Assessment: Generalized weakness    Cervical / Trunk Assessment Cervical / Trunk Assessment: Kyphotic  Communication   Communication Communication: No apparent difficulties    Cognition Arousal: Alert Behavior During Therapy: Impulsive   PT - Cognitive impairments: Problem solving, Safety/Judgement, Awareness     PT - Cognition Comments: pt trying to stand mutliple times without assist and losing balance posteriorly stating she is fine and not losing her balance during gait. Following commands:  Impaired Following commands impaired: Follows one step commands inconsistently     Cueing Cueing Techniques: Verbal cues, Tactile cues     General Comments General comments (skin integrity, edema, etc.): O2 sats dropping on 4L with activity, increased to 6L with O2 sats remaining in the low 90's throughout mobility.        Assessment/Plan    PT Assessment Patient needs continued PT services  PT Problem List Decreased activity tolerance;Decreased balance;Decreased mobility;Decreased strength;Decreased coordination;Decreased safety awareness       PT Treatment Interventions DME instruction;Therapeutic exercise;Gait training;Balance training;Stair training;Functional mobility training;Therapeutic activities;Patient/family education    PT Goals (Current goals can be found in the Care Plan section)  Acute Rehab PT Goals Patient Stated Goal: to get better PT Goal Formulation: With patient Time For Goal Achievement: 02/14/24 Potential to Achieve Goals: Fair    Frequency Min 2X/week        AM-PAC PT 6 Clicks Mobility  Outcome Measure Help needed turning from your back to your side while in a flat bed without using bedrails?: A Little Help needed moving from lying on your back to sitting on the side of a flat bed without using bedrails?: A Little Help needed moving to and from a bed to a chair (including a wheelchair)?: A Little Help needed standing up from a chair using your arms (e.g., wheelchair or bedside chair)?: A Little Help needed to walk in hospital room?: Total (2 person assist) Help needed climbing 3-5 steps with a railing? : Total 6 Click Score: 14  End of Session Equipment Utilized During Treatment: Gait belt;Oxygen Activity Tolerance: Patient tolerated treatment well Patient left: in chair;Other (comment) (pt in transport chair with RN to move to new room) Nurse Communication: Mobility status PT Visit Diagnosis: Other abnormalities of gait and mobility  (R26.89);Unsteadiness on feet (R26.81);Muscle weakness (generalized) (M62.81)    Time: 8467-8455 PT Time Calculation (min) (ACUTE ONLY): 12 min   Charges:   PT Evaluation $PT Eval Low Complexity: 1 Low   PT General Charges $$ ACUTE PT VISIT: 1 Visit       Donna Watkins, DPT, CLT  Acute Rehabilitation Services Office: [phone] (Secure chat preferred)   Donna Watkins 01/31/2024, 4:44 PM

## 2024-01-31 NOTE — Progress Notes (Signed)
 Updated ABG 7.34/86/85/95%. She is awake and wanting the BiPAP off. Diet will be resumed D/c IVF BiPAP PRN [place] 4 L  to keep SpO2 90-92%

## 2024-02-01 ENCOUNTER — Inpatient Hospital Stay (HOSPITAL_COMMUNITY)

## 2024-02-01 ENCOUNTER — Telehealth: Payer: Self-pay

## 2024-02-01 ENCOUNTER — Encounter (HOSPITAL_COMMUNITY): Payer: Self-pay

## 2024-02-01 DIAGNOSIS — J189 Pneumonia, unspecified organism: Secondary | ICD-10-CM | POA: Diagnosis not present

## 2024-02-01 DIAGNOSIS — J9622 Acute and chronic respiratory failure with hypercapnia: Secondary | ICD-10-CM | POA: Diagnosis not present

## 2024-02-01 DIAGNOSIS — Z515 Encounter for palliative care: Secondary | ICD-10-CM

## 2024-02-01 DIAGNOSIS — J9601 Acute respiratory failure with hypoxia: Secondary | ICD-10-CM | POA: Diagnosis not present

## 2024-02-01 DIAGNOSIS — G9340 Encephalopathy, unspecified: Secondary | ICD-10-CM | POA: Diagnosis not present

## 2024-02-01 DIAGNOSIS — J441 Chronic obstructive pulmonary disease with (acute) exacerbation: Secondary | ICD-10-CM | POA: Diagnosis not present

## 2024-02-01 DIAGNOSIS — Z85118 Personal history of other malignant neoplasm of bronchus and lung: Secondary | ICD-10-CM | POA: Diagnosis not present

## 2024-02-01 DIAGNOSIS — Z7189 Other specified counseling: Secondary | ICD-10-CM | POA: Diagnosis not present

## 2024-02-01 DIAGNOSIS — R012 Other cardiac sounds: Secondary | ICD-10-CM

## 2024-02-01 DIAGNOSIS — J9602 Acute respiratory failure with hypercapnia: Secondary | ICD-10-CM | POA: Diagnosis not present

## 2024-02-01 LAB — CBC
HCT: 37.7 % (ref 36.0–46.0)
Hemoglobin: 11.2 g/dL — ABNORMAL LOW (ref 12.0–15.0)
MCH: 30.4 pg (ref 26.0–34.0)
MCHC: 29.7 g/dL — ABNORMAL LOW (ref 30.0–36.0)
MCV: 102.2 fL — ABNORMAL HIGH (ref 80.0–100.0)
Platelets: 126 10*3/uL — ABNORMAL LOW (ref 150–400)
RBC: 3.69 MIL/uL — ABNORMAL LOW (ref 3.87–5.11)
RDW: 11.9 % (ref 11.5–15.5)
WBC: 5.7 10*3/uL (ref 4.0–10.5)
nRBC: 0 % (ref 0.0–0.2)

## 2024-02-01 LAB — MAGNESIUM: Magnesium: 1.7 mg/dL (ref 1.7–2.4)

## 2024-02-01 LAB — BLOOD GAS, ARTERIAL
Acid-Base Excess: 27.9 mmol/L — ABNORMAL HIGH (ref 0.0–2.0)
Bicarbonate: 58.3 mmol/L — ABNORMAL HIGH (ref 20.0–28.0)
Drawn by: 51185
O2 Saturation: 97 %
Patient temperature: 37
pCO2 arterial: 92 mmHg (ref 32–48)
pH, Arterial: 7.41 (ref 7.35–7.45)
pO2, Arterial: 73 mmHg — ABNORMAL LOW (ref 83–108)

## 2024-02-01 LAB — CULTURE, BLOOD (ROUTINE X 2)
Culture: NO GROWTH
Special Requests: ADEQUATE

## 2024-02-01 LAB — ECHOCARDIOGRAM COMPLETE
Area-P 1/2: 3.91 cm2
Calc EF: 55 %
Height: 63 in
P 1/2 time: 143 ms
S' Lateral: 3.4 cm
Single Plane A2C EF: 56.6 %
Single Plane A4C EF: 54.4 %
Weight: 1590.84 [oz_av]

## 2024-02-01 LAB — PHOSPHORUS: Phosphorus: 2.1 mg/dL — ABNORMAL LOW (ref 2.5–4.6)

## 2024-02-01 MED ORDER — CLONAZEPAM 0.5 MG PO TABS
0.5000 mg | ORAL_TABLET | Freq: Every day | ORAL | Status: DC
  Administered 2024-02-01 – 2024-02-02 (×2): 0.5 mg via ORAL
  Filled 2024-02-01 (×2): qty 1

## 2024-02-01 MED ORDER — GABAPENTIN 100 MG PO CAPS
100.0000 mg | ORAL_CAPSULE | Freq: Three times a day (TID) | ORAL | Status: DC
  Administered 2024-02-01 – 2024-02-02 (×4): 100 mg via ORAL
  Filled 2024-02-01 (×4): qty 1

## 2024-02-01 MED ORDER — SODIUM PHOSPHATES 45 MMOLE/15ML IV SOLN
30.0000 mmol | Freq: Once | INTRAVENOUS | Status: AC
  Administered 2024-02-01: 30 mmol via INTRAVENOUS
  Filled 2024-02-01: qty 10

## 2024-02-01 MED ORDER — METHYLPREDNISOLONE SODIUM SUCC 40 MG IJ SOLR
40.0000 mg | Freq: Two times a day (BID) | INTRAMUSCULAR | Status: DC
  Administered 2024-02-01 – 2024-02-02 (×2): 40 mg via INTRAVENOUS
  Filled 2024-02-01 (×2): qty 1

## 2024-02-01 NOTE — Plan of Care (Signed)

## 2024-02-01 NOTE — Progress Notes (Addendum)
 This chaplain responded to PMT-PA consult for creating the Pt. Advance Directive-HCPOA and Living Will. The Pt. completed AD education with the chaplain and answered the chaplain's clarifying questions. The Pt. is ready to notarize her AD. Family is not at the bedside.  The Pt. named Donna Watkins as her healthcare agent and completed a Living Will.  The chaplain is present with the Pt., notary, and witnesses for the notarizing of the Pt. AD. The chaplain gave the Pt. the original AD along with one copy. The chaplain scanned the Pt. AD into the Pt. EMR.  **[id number] The Pt. inquired about giving her body to science, the chaplain responded to the Pt. question with a handout from [facility] body donation.    This chaplain is available for F/U spiritual care as needed.  Chaplain Leeroy Hummer [phone]

## 2024-02-01 NOTE — Progress Notes (Signed)
" °  Echocardiogram 2D Echocardiogram has been performed.  Koleen KANDICE Popper, RDCS 02/01/2024, 3:46 PM "

## 2024-02-01 NOTE — Progress Notes (Signed)
 " PROGRESS NOTE    Donna Watkins  FMW:[id number] DOB: [birth year] DOA: 01/27/2024 PCP: Shona Norleen PEDLAR, MD    Chief Complaint  Patient presents with   Altered Mental Status    Brief Narrative:  Pt is a 64 y/o female with past medical history of COPD on 4L of oxygen at home with last FEV1 of 28 in 2018, squamous cell lung carcinoma bilaterally s/p radiation therapy completed 05/26/2022, type 1 chiari malformation, anxiety on daily xanax , and malnutrition. She [facility] with altered mental status and seizure like activity in acute respiratory distress and was intubated before being transferred to [facility].    Assessment & Plan:   Principal Problem:   Acute respiratory failure with hypoxia and hypercapnia (HCC) Active Problems:   Protein-calorie malnutrition, moderate   Acute encephalopathy   Pressure injury of skin   Acute respiratory failure due to COPD exacerbation Suspected L lower lobe pneumonia  -continue duoneb, pulmicort , brovana , yupelri  -continue Rocephin  2g q24hrs  Acute metabolic encephalopathy -likely due to respiratory failure  Seizure-like activity on admission -neuro evaluation: normal MRI and EEG, no AEDs recommended at this time  Malnutrition -monitor CMP -continue ensure 3x daily   Hypernatremia -resolved  Anxiety -takes xanax  1mg  3x daily prn at home, potentially withdrawing  -continue clonazepam  and sertraline  -hydroxyzine  prn   Hyperlipidemia -continue atorvastatin    Mild thrombocytopenia -trend CBC -improving from admission   Chiari malformation -type 1  DVT prophylaxis: Heparin  5000 units SQ q8hrs Code Status: full  Family Communication: None at bedside  Disposition:   Status is: Inpatient Remains inpatient appropriate because: IV abx   Consultants:  Neurology: seizure-like activity, no AEDs recommended, no focal deficits post extubation, MRI unremarkable for an acute process  Procedures:  Intubation (1/22) Extubation  (1/23)  Antimicrobials:  Rocephin  2g q24hrs -- start 01/29/24, plan to stop 02/05/24   Subjective: Pt states that she is feeling much better this morning. She feels more anxious this morning than she has the past few days and wants xanax . She says she is looking forward to getting up and walking because it helps her to breath easier. She denies chest pain and worsening shortness of breath, but reports an intermittent non productive cough.   Objective: Vitals:   01/31/24 1559 01/31/24 2005 01/31/24 2308 02/01/24 0452  BP: (!) 158/70 124/71 128/67 (!) 146/69  Pulse: 70  65 76  Resp: (!) 24 19 18 18   Temp: 99.2 F (37.3 C) 98.5 F (36.9 C) 98.4 F (36.9 C) 99.2 F (37.3 C)  TempSrc: Oral Oral Oral Oral  SpO2: 96%  99% 97%  Weight:    45.1 kg  Height:        Intake/Output Summary (Last 24 hours) at 02/01/2024 0809 Last data filed at 02/01/2024 0456 Gross per 24 hour  Intake 120.34 ml  Output 300 ml  Net -179.66 ml   Filed Weights   01/30/24 1915 01/31/24 0500 02/01/24 0452  Weight: 44.9 kg 44.9 kg 45.1 kg    Examination:  General exam: Appears calm and comfortable  Respiratory system: Clear to auscultation. Accessory muscle use. Increased respiratory effort. Normal capillary refill, no clubbing, no cyanosis. On 4L O2 nasal.  Cardiovascular system: S1 & S2 heard, RRR. No JVD, murmurs, rubs, gallops or clicks. No pedal edema. Gastrointestinal system: Abdomen is nondistended, soft and nontender. No organomegaly or masses felt.  Central nervous system: Alert and oriented. No focal neurological deficits. Extremities: Symmetric 5 x 5 power. Skin: No rashes,  lesions or ulcers Psychiatry: Judgement and insight appear normal. Mood & affect appropriate.     Data Reviewed: I have personally reviewed following labs and imaging studies  CBC: Recent Labs  Lab 01/27/24 1014 01/27/24 1454 01/28/24 0348 01/29/24 0504 01/30/24 0546 01/30/24 2354 01/31/24 0056 02/01/24 0250  WBC  8.3   < > 5.6 10.4 11.0*  --  6.1 5.7  NEUTROABS 7.7  --   --   --   --   --   --   --   HGB 12.8   < > 9.4* 10.3* 11.0* 11.6* 10.8* 11.2*  HCT 46.1*   < > 32.2* 34.0* 36.7 34.0* 36.6 37.7  MCV 108.5*   < > 105.2* 101.2* 101.7*  --  101.9* 102.2*  PLT 113*   < > 74* 89* 124*  --  105* 126*   < > = values in this interval not displayed.    Basic Metabolic Panel: Recent Labs  Lab 01/27/24 2040 01/28/24 0348 01/29/24 0504 01/30/24 0546 01/30/24 2354 01/31/24 0056 02/01/24 0250  NA 145 140 139 145 143 144  --   K 3.8 3.8 4.1 3.7 4.0 4.0  --   CL 96* 99 99 98  --  95*  --   CO2 35* 33* 32 41*  --  43*  --   GLUCOSE 111* 108* 88 96  --  158*  --   BUN 20 18 15 19   --  23  --   CREATININE 0.51 0.54 0.37* 0.37*  --  0.37*  --   CALCIUM  8.5* 8.3* 9.0 8.9  --  8.5*  --   MG 2.4  --  1.7 1.8  --  2.3 1.7  PHOS  --   --  2.2* 2.0*  --  2.3* 2.1*    GFR: Estimated Creatinine Clearance: 51.9 mL/min (A) (by C-G formula based on SCr of 0.37 mg/dL (L)).  Liver Function Tests: Recent Labs  Lab 01/27/24 1014 01/31/24 0056  AST 22 26  ALT 16 21  ALKPHOS 83 62  BILITOT 0.7 0.3  PROT 7.1 5.9*  ALBUMIN 4.4 3.6    CBG: Recent Labs  Lab 01/28/24 0753 01/28/24 1123 01/30/24 1745 01/30/24 2020 01/31/24 0830  GLUCAP 86 126* 184* 128* 148*     Recent Results (from the past 240 hours)  Culture, blood (Routine X 2) w Reflex to ID Panel     Status: Abnormal   Collection Time: 01/27/24 10:20 AM   Specimen: BLOOD  Result Value Ref Range Status   Specimen Description   Final    BLOOD LEFT ANTECUBITAL Performed at [facility], [address], [place], [place] [address]    Special Requests   Final    BOTTLES DRAWN AEROBIC AND ANAEROBIC Blood Culture results may not be optimal due to an inadequate volume of blood received in culture bottles Performed at [facility], [address], [place], [place] [address]    Culture  Setup Time   Final    AEROBIC BOTTLE ONLY GRAM  POSITIVE COCCI Gram Stain Report Called to,Read Back By and Verified With: A. PUGH ON 01/28/2024 @12 :11PM BY T. HAMER  PHARMD GSABRA BRAZIER [phone] @ 1923 FH    Culture (A)  Final    STAPHYLOCOCCUS CAPITIS THE SIGNIFICANCE OF ISOLATING THIS ORGANISM FROM A SINGLE SET OF BLOOD CULTURES WHEN MULTIPLE SETS ARE DRAWN IS UNCERTAIN. PLEASE NOTIFY THE MICROBIOLOGY DEPARTMENT WITHIN ONE WEEK IF SPECIATION AND SENSITIVITIES ARE REQUIRED. Performed at  [facility], [address] [place], [place] [address]    Report Status 01/29/2024 FINAL  Final  Blood Culture ID Panel (Reflexed)     Status: Abnormal   Collection Time: 01/27/24 10:20 AM  Result Value Ref Range Status   Enterococcus faecalis NOT DETECTED NOT DETECTED Final   Enterococcus Faecium NOT DETECTED NOT DETECTED Final   Listeria monocytogenes NOT DETECTED NOT DETECTED Final   Staphylococcus species DETECTED (A) NOT DETECTED Final    Comment: CRITICAL RESULT CALLED TO, READ BACK BY AND VERIFIED WITH: MAYA JUDITHANN BRAZIER [phone] @ 1923 FH    Staphylococcus aureus (BCID) NOT DETECTED NOT DETECTED Final   Staphylococcus epidermidis NOT DETECTED NOT DETECTED Final   Staphylococcus lugdunensis NOT DETECTED NOT DETECTED Final   Streptococcus species NOT DETECTED NOT DETECTED Final   Streptococcus agalactiae NOT DETECTED NOT DETECTED Final   Streptococcus pneumoniae NOT DETECTED NOT DETECTED Final   Streptococcus pyogenes NOT DETECTED NOT DETECTED Final   A.calcoaceticus-baumannii NOT DETECTED NOT DETECTED Final   Bacteroides fragilis NOT DETECTED NOT DETECTED Final   Enterobacterales NOT DETECTED NOT DETECTED Final   Enterobacter cloacae complex NOT DETECTED NOT DETECTED Final   Escherichia coli NOT DETECTED NOT DETECTED Final   Klebsiella aerogenes NOT DETECTED NOT DETECTED Final   Klebsiella oxytoca NOT DETECTED NOT DETECTED Final   Klebsiella pneumoniae NOT DETECTED NOT DETECTED Final   Proteus species NOT DETECTED NOT DETECTED Final    Salmonella species NOT DETECTED NOT DETECTED Final   Serratia marcescens NOT DETECTED NOT DETECTED Final   Haemophilus influenzae NOT DETECTED NOT DETECTED Final   Neisseria meningitidis NOT DETECTED NOT DETECTED Final   Pseudomonas aeruginosa NOT DETECTED NOT DETECTED Final   Stenotrophomonas maltophilia NOT DETECTED NOT DETECTED Final   Candida albicans NOT DETECTED NOT DETECTED Final   Candida auris NOT DETECTED NOT DETECTED Final   Candida glabrata NOT DETECTED NOT DETECTED Final   Candida krusei NOT DETECTED NOT DETECTED Final   Candida parapsilosis NOT DETECTED NOT DETECTED Final   Candida tropicalis NOT DETECTED NOT DETECTED Final   Cryptococcus neoformans/gattii NOT DETECTED NOT DETECTED Final    Comment: Performed at [facility], [address], [place], [place] [address]  Culture, blood (Routine X 2) w Reflex to ID Panel     Status: None   Collection Time: 01/27/24 10:52 AM   Specimen: BLOOD  Result Value Ref Range Status   Specimen Description BLOOD BLOOD LEFT HAND  Final   Special Requests   Final    BOTTLES DRAWN AEROBIC AND ANAEROBIC Blood Culture adequate volume   Culture   Final    NO GROWTH 5 DAYS Performed at [facility], [address] [place], [place] [address]    Report Status 02/01/2024 FINAL  Final  MRSA Next Gen by PCR, Nasal     Status: None   Collection Time: 01/27/24  2:40 PM   Specimen: Nasal Mucosa; Nasal Swab  Result Value Ref Range Status   MRSA by PCR Next Gen NOT DETECTED NOT DETECTED Final    Comment: (NOTE) The GeneXpert MRSA Assay (FDA approved for NASAL specimens only), is one component of a comprehensive MRSA colonization surveillance program. It is not intended to diagnose MRSA infection nor to guide or monitor treatment for MRSA infections. Test performance is not FDA approved in patients less than 75 years old. Performed at [facility], [address] [place], [place] [address]   Respiratory (~20 pathogens) panel by PCR  Status: None   Collection Time: 01/27/24  3:38 PM   Specimen: Nasopharyngeal Swab; Respiratory  Result Value Ref Range Status   Adenovirus NOT DETECTED NOT DETECTED Final   Coronavirus 229E NOT DETECTED NOT DETECTED Final    Comment: (NOTE) The Coronavirus on the Respiratory Panel, DOES NOT test for the novel  Coronavirus (2019 nCoV)    Coronavirus HKU1 NOT DETECTED NOT DETECTED Final   Coronavirus NL63 NOT DETECTED NOT DETECTED Final   Coronavirus OC43 NOT DETECTED NOT DETECTED Final   Metapneumovirus NOT DETECTED NOT DETECTED Final   Rhinovirus / Enterovirus NOT DETECTED NOT DETECTED Final   Influenza A NOT DETECTED NOT DETECTED Final   Influenza B NOT DETECTED NOT DETECTED Final   Parainfluenza Virus 1 NOT DETECTED NOT DETECTED Final   Parainfluenza Virus 2 NOT DETECTED NOT DETECTED Final   Parainfluenza Virus 3 NOT DETECTED NOT DETECTED Final   Parainfluenza Virus 4 NOT DETECTED NOT DETECTED Final   Respiratory Syncytial Virus NOT DETECTED NOT DETECTED Final   Bordetella pertussis NOT DETECTED NOT DETECTED Final   Bordetella Parapertussis NOT DETECTED NOT DETECTED Final   Chlamydophila pneumoniae NOT DETECTED NOT DETECTED Final   Mycoplasma pneumoniae NOT DETECTED NOT DETECTED Final    Comment: Performed at [facility], [address], [place], [place] [address]  Resp panel by RT-PCR (RSV, Flu A&B, Covid) Anterior Nasal Swab     Status: None   Collection Time: 01/27/24  3:38 PM   Specimen: Anterior Nasal Swab  Result Value Ref Range Status   SARS Coronavirus 2 by RT PCR NEGATIVE NEGATIVE Final   Influenza A by PCR NEGATIVE NEGATIVE Final   Influenza B by PCR NEGATIVE NEGATIVE Final    Comment: (NOTE) The Xpert Xpress SARS-CoV-2/FLU/RSV plus assay is intended as an aid in the diagnosis of influenza from Nasopharyngeal swab specimens and should not be used as a sole basis for treatment. Nasal washings and aspirates are unacceptable for Xpert Xpress  SARS-CoV-2/FLU/RSV testing.  Fact Sheet for Patients: bloggercourse.com  Fact Sheet for Healthcare Providers: seriousbroker.it  This test is not yet approved or cleared by the [place]  FDA and has been authorized for detection and/or diagnosis of SARS-CoV-2 by FDA under an Emergency Use Authorization (EUA). This EUA will remain in effect (meaning this test can be used) for the duration of the COVID-19 declaration under Section 564(b)(1) of the Act, 21 U.S.C. section 360bbb-3(b)(1), unless the authorization is terminated or revoked.     Resp Syncytial Virus by PCR NEGATIVE NEGATIVE Final    Comment: (NOTE) Fact Sheet for Patients: bloggercourse.com  Fact Sheet for Healthcare Providers: seriousbroker.it  This test is not yet approved or cleared by the [place]  FDA and has been authorized for detection and/or diagnosis of SARS-CoV-2 by FDA under an Emergency Use Authorization (EUA). This EUA will remain in effect (meaning this test can be used) for the duration of the COVID-19 declaration under Section 564(b)(1) of the Act, 21 U.S.C. section 360bbb-3(b)(1), unless the authorization is terminated or revoked.  Performed at [facility], [address], [place], [place] [address]   Culture, Respiratory w Gram Stain     Status: None   Collection Time: 01/27/24  3:38 PM   Specimen: Tracheal Aspirate; Respiratory  Result Value Ref Range Status   Specimen Description TRACHEAL ASPIRATE  Final   Special Requests NONE  Final   Gram Stain   Final    FEW WBC PRESENT, PREDOMINANTLY PMN FEW GRAM POSITIVE  COCCI IN PAIRS IN CHAINS    Culture   Final    ABUNDANT Consistent with normal respiratory flora. Performed at [facility], [address], [place], [place] [address]    Report Status 01/31/2024 FINAL  Final         Radiology Studies: No results  found.      Scheduled Meds:  arformoterol   15 mcg Nebulization BID   atorvastatin   20 mg Oral Daily   budesonide  (PULMICORT ) nebulizer solution  0.5 mg Nebulization BID   clonazePAM   0.5 mg Oral Daily   feeding supplement  237 mL Oral TID BM   folic acid   1 mg Oral Daily   heparin   5,000 Units Subcutaneous Q8H   mirtazapine   15 mg Oral Daily   multivitamin with minerals  1 tablet Oral Daily   pantoprazole   40 mg Oral Daily   polyethylene glycol  17 g Oral Daily   revefenacin   175 mcg Nebulization Daily   senna  1 tablet Oral BID   sertraline   50 mg Oral Daily   thiamine   100 mg Oral Daily   Continuous Infusions:  cefTRIAXone  (ROCEPHIN )  IV 2 g (01/31/24 1706)   sodium PHOSPHATE  IVPB (in mmol)       LOS: 5 days     Teri Legacy, PA-S [employer]    To contact the attending provider between 7A-7P or the covering provider during after hours 7P-7A, please log into the web site [email or web address] and access using universal [place] password for that web site. If you do not have the password, please call the hospital operator.  02/01/2024, 8:09 AM   "

## 2024-02-01 NOTE — TOC Progression Note (Addendum)
 Transition of Care [facility]) - Progression Note    Patient Details  Name: Donna Watkins MRN: [id number] Date of Birth: 1960-02-02  Transition of Care [facility]) CM/SW Contact  [name], [place] Phone Number: 02/01/2024, 11:57 AM  Clinical Narrative:   CSW discussed PT rec for STR with patient, who is Ox4. Patient is agreeable at this time and would like CSW to follow up with bed offers. CSW inquired if patient would like CSW to follow up with family, patient stated no.  CSW will continue to follow.    Expected Discharge Plan: Skilled Nursing Facility Barriers to Discharge: Continued Medical Work up, SNF Pending bed offer, English As A Second Language Teacher               Expected Discharge Plan and Services In-house Referral: Clinical Social Work   Post Acute Care Choice: Skilled Nursing Facility Living arrangements for the past 2 months: Single Family Home                                       Social Drivers of Health (SDOH) Interventions SDOH Screenings   Food Insecurity: Patient Declined (01/29/2024)  Housing: Patient Declined (01/29/2024)  Transportation Needs: Patient Declined (01/29/2024)  Utilities: Patient Declined (01/29/2024)  Alcohol Screen: Low Risk (06/23/2022)  Depression (PHQ2-9): Low Risk (08/07/2022)  Financial Resource Strain: Low Risk (08/03/2022)  Physical Activity: Insufficiently Active (08/03/2022)   Received from [facility]  Social Connections: Unknown (01/29/2024)  Stress: No Stress Concern Present (08/03/2022)  Tobacco Use: Medium Risk (01/29/2024)  Health Literacy: Low Risk (08/03/2022)   Received from [facility]    Readmission Risk Interventions    03/27/2022    1:15 PM 02/06/2022    2:58 PM  Readmission Risk Prevention Plan  Transportation Screening Complete Complete  Home Care Screening  Complete  Medication Review (RN CM)  Complete  HRI or Home Care Consult Complete   Social Work Consult for Recovery Care Planning/Counseling  Complete   Palliative Care Screening Not Applicable   Medication Review Oceanographer) Complete

## 2024-02-01 NOTE — Progress Notes (Signed)
 Mobility Specialist Progress Note:    02/01/24 1145  Mobility  Activity Stood with assistance;Dangled on edge of bed (5x STS, Standing Marches)  Level of Assistance Contact guard assist, steadying assist  Assistive Device Other (Comment) (HHA)  Range of Motion/Exercises Active  Activity Response Tolerated fair  Mobility Referral Yes  Mobility visit 1 Mobility  Mobility Specialist Start Time (ACUTE ONLY) 1145  Mobility Specialist Stop Time (ACUTE ONLY) 1153  Mobility Specialist Time Calculation (min) (ACUTE ONLY) 8 min   Received pt laying in bed pleasant and agreeable to session. No c/o any symptoms. Pt able to transfer to EOB on own and stand w/ light to no assist. Pt able to perform stands and marches w/ HHA to balance. Session cut short d/t respiratory needing to do labs and put pt on BiPap for rising CO2. Left pt on EOB w/ RT in room and all needs met.   Donna Watkins Mobility Specialist Please Neurosurgeon or Rehab Office at [phone]

## 2024-02-01 NOTE — [place] FL2 (Signed)
 " [place]  MEDICAID FL2 LEVEL OF CARE FORM     IDENTIFICATION  Patient Name: Donna Watkins Birthdate: [birth year] Sex: female Admission Date (Current Location): 01/27/2024  [place] Number:  Ray[place] [id number] Facility and Address:  [facility] [place], [address], [place], [place] [address]      Provider Number: [id number]  Attending Physician Name and Address:  Jonel Lonni SQUIBB, *  Relative Name and Phone Number:  Trudy Jackolyn Ahumada, Emergency Contact  [phone]    Current Level of Care: Hospital Recommended Level of Care: Skilled Nursing Facility Prior Approval Number:    Date Approved/Denied:   PASRR Number: [id number]  Discharge Plan: SNF    Current Diagnoses: Patient Active Problem List   Diagnosis Date Noted   Pressure injury of skin 01/30/2024   Acute respiratory failure with hypoxia and hypercapnia (HCC) 01/27/2024   Acute encephalopathy 01/27/2024   Encounter for general adult medical examination with abnormal findings 05/12/2022   Malnutrition of moderate degree 03/27/2022   Sacral decubitus ulcer 03/26/2022   Elevated troponin 03/26/2022   Elevated d-dimer 03/26/2022   Abnormal transaminases 03/26/2022   Acute on chronic respiratory failure with hypoxia (HCC) 02/05/2022   Respiratory acidosis 02/05/2022   Acute on chronic respiratory failure with hypoxia and hypercapnia (HCC) 11/26/2021   Fall at home, initial encounter 11/26/2021   Near syncope 11/26/2021   Oropharyngeal candidiasis 11/26/2021   Elevated MCV 11/26/2021   Thrombocytopenia 11/26/2021   Hyperglycemia 11/26/2021   Nodule of lower lobe of right lung 11/26/2021   Squamous cell carcinoma of lung, left (HCC) 11/26/2021   GERD (gastroesophageal reflux disease) 11/26/2021   Mixed hyperlipidemia 11/26/2021   Underweight 11/26/2021   Failure to thrive in adult 11/26/2021   Generalized anxiety disorder 11/26/2021   Insomnia 11/26/2021    Protein-calorie malnutrition, moderate 11/26/2021   Dysphagia 02/20/2021   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) 12/09/2020   Squamous carcinoma of lung, left (HCC) 11/12/2020   Nodule of upper lobe of left lung 08/12/2020   Rash 08/28/2019   Encounter for screening colonoscopy 08/31/2012   End stage COPD (HCC) 06/30/2011   Pulmonary nodule seen on imaging study 06/15/2011   Polycythemia secondary to smoking 06/15/2011   Iron deficiency anemia 06/15/2011   Dehydration, severe 06/14/2011   Hypotensive episode 06/14/2011   Sinus bradycardia 06/14/2011   Arnold-Chiari malformation- mild 06/13/2011   COPD with acute exacerbation (HCC) 06/13/2011   Depression 06/13/2011   Weight loss, non-intentional 06/13/2011   Hypokalemia 06/13/2011   Acute respiratory failure with hypoxia (HCC) 06/13/2011   Macrocytosis 06/13/2011   Chronic respiratory failure with hypoxia and hypercapnia (HCC) 06/13/2011    Orientation RESPIRATION BLADDER Height & Weight     Self, Time, Situation, Place  O2 (3L O2 [place]) Continent, External catheter Weight: 99 lb 6.8 oz (45.1 kg) Height:  5' 3 (160 cm)  BEHAVIORAL SYMPTOMS/MOOD NEUROLOGICAL BOWEL NUTRITION STATUS      Continent Diet (Please see dc summary)  AMBULATORY STATUS COMMUNICATION OF NEEDS Skin   Extensive Assist Verbally PU Stage and Appropriate Care (Pressure Injury Sacrum Medial Stage 1)                       Personal Care Assistance Level of Assistance  Bathing, Feeding, Dressing Bathing Assistance:  (Please see dc summary) Feeding assistance:  (Please see dc summary) Dressing Assistance:  (Please see dc summary)     Functional Limitations Info  Sight, Hearing, Speech Sight Info: Impaired (eyeglasses) Hearing Info: Adequate Speech Info: Adequate    SPECIAL CARE FACTORS FREQUENCY  PT (By licensed PT), OT (By licensed OT)     PT Frequency: 5x week OT Frequency: 5x week            Contractures  Contractures Info: Not present    Additional Factors Info  Code Status, Allergies, Psychotropic Code Status Info: Full Allergies Info: NKA Psychotropic Info: Zoloft , Klonopin , gabapentin          Current Medications (02/01/2024):  This is the current hospital active medication list Current Facility-Administered Medications  Medication Dose Route Frequency Provider Last Rate Last Admin   acetaminophen  (TYLENOL ) tablet 650 mg  650 mg Oral Q4H PRN Hindman, Katherine G, RPH       arformoterol  (BROVANA ) nebulizer solution 15 mcg  15 mcg Nebulization BID Bowser, Grace E, NP   15 mcg at 02/01/24 9048   atorvastatin  (LIPITOR) tablet 20 mg  20 mg Oral Daily Gretta Doffing P, DO   20 mg at 02/01/24 1031   budesonide  (PULMICORT ) nebulizer solution 0.5 mg  0.5 mg Nebulization BID Bowser, Grace E, NP   0.5 mg at 02/01/24 0950   cefTRIAXone  (ROCEPHIN ) 2 g in sodium chloride  0.9 % 100 mL IVPB  2 g Intravenous Q24H Sharie Bourbon, MD 200 mL/hr at 01/31/24 1706 2 g at 01/31/24 1706   clonazePAM  (KLONOPIN ) tablet 0.5 mg  0.5 mg Oral Daily Danford, Lonni SQUIBB, MD   0.5 mg at 02/01/24 1031   feeding supplement (ENSURE PLUS HIGH PROTEIN) liquid 237 mL  237 mL Oral TID BM Sharie Bourbon, MD   237 mL at 02/01/24 1032   folic acid  (FOLVITE ) tablet 1 mg  1 mg Oral Daily Sharie Bourbon, MD   1 mg at 02/01/24 1031   gabapentin  (NEURONTIN ) capsule 100 mg  100 mg Oral TID Jonel Lonni SQUIBB, MD   100 mg at 02/01/24 1044   heparin  injection 5,000 Units  5,000 Units Subcutaneous Q8H Bowser, Ronnald BRAVO, NP   5,000 Units at 02/01/24 0544   hydrOXYzine  (ATARAX ) tablet 25 mg  25 mg Oral TID PRN Gretta Doffing P, DO   25 mg at 02/01/24 0544   ipratropium-albuterol  (DUONEB) 0.5-2.5 (3) MG/3ML nebulizer solution 3 mL  3 mL Nebulization Q4H PRN Bowser, Grace E, NP   3 mL at 01/30/24 1954   mirtazapine  (REMERON ) tablet 15 mg  15 mg Oral Daily Gretta Doffing P, DO   15 mg at 02/01/24 1031   multivitamin with minerals  tablet 1 tablet  1 tablet Oral Daily Sharie Bourbon, MD   1 tablet at 02/01/24 1031   ondansetron  (ZOFRAN ) tablet 4 mg  4 mg Oral Q6H PRN Hindman, Katherine G, RPH       Oral care mouth rinse  15 mL Mouth Rinse PRN Sharie Bourbon, MD       pantoprazole  (PROTONIX ) EC tablet 40 mg  40 mg Oral Daily Hindman, Katherine G, RPH   40 mg at 02/01/24 1031   polyethylene glycol (MIRALAX  / GLYCOLAX ) packet 17 g  17 g Oral Daily Hindman, Katherine G, RPH   17 g at 02/01/24 1031   polyethylene glycol (MIRALAX  / GLYCOLAX ) packet 17 g  17 g Oral Daily PRN Hindman, Katherine G, RPH       revefenacin  (YUPELRI ) nebulizer solution 175 mcg  175 mcg Nebulization Daily Daren Ronnald BRAVO, NP   175 mcg at 02/01/24 0950   senna (SENOKOT) tablet 8.6 mg  1 tablet Oral BID Hindman, Katherine G, RPH   8.6 mg at 02/01/24 1031   senna (SENOKOT) tablet 8.6 mg  1 tablet Oral BID PRN Hindman, Katherine G, RPH       sertraline  (ZOLOFT ) tablet 50 mg  50 mg Oral Daily Gretta Doffing P, DO   50 mg at 02/01/24 1031   sodium phosphate  30 mmol in sodium chloride  0.9 % 250 mL infusion  30 mmol Intravenous Once Danford, Christopher P, MD 43 mL/hr at 02/01/24 1051 30 mmol at 02/01/24 1051   thiamine  (VITAMIN B1) tablet 100 mg  100 mg Oral Daily Sharie Bourbon, MD   100 mg at 02/01/24 1031     Discharge Medications: Please see discharge summary for a list of discharge medications.  Relevant Imaging Results:  Relevant Lab Results:   Additional Information SSN [id number]  Sarai January C Roe Koffman, LCSWA     "

## 2024-02-01 NOTE — Consult Note (Signed)
 "                                                  Palliative Care Consult Note                                  Date: 02/01/2024   Patient Name: Donna Watkins  DOB:Jun 28, 1960  FMW:[id number]  Age / Sex:64 y.o., female  PCP: Donna Norleen PEDLAR, MD Referring Physician: Jonel Lonni Watkins, *  Reason for Consultation: Establishing goals of care  Past Medical History:  Diagnosis Date   Anxiety    panic attacks   Cancer [facility])    COPD (chronic obstructive pulmonary disease) (HCC) 03/01/2012   Depression    Emphysema of lung (HCC)    GERD (gastroesophageal reflux disease)    History of radiation therapy    Left Lung- 12/09/20-12/19/20- Dr. Lynwood Watkins   History of radiation therapy    Left lung- 05/19/22-05/26/22- Dr. Lynwood Watkins   Hyperlipidemia    Mild scoliosis    Oxygen deficiency    Oxygen dependent      Assessment & Plan:   HPI/Patient Profile: 64 y.o. 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  Per H&P by Bowser nurse practitioner on 01/27/2024, patient was somnolent and hard to wake with reported seizure-like activity, and desaturation to the 70s on her home oxygen.  Patient was intubated on 1/22-1/23 for acute respiratory failure. Patient transferred out of the ICU on 1/24, but had a rapid response on 1/25 due to becoming obtunded mostly secondary to her benzodiazepine use resulting in hypercarbia and altered mentation. Patient placed on BiPAP with rapid improvement to her mentation transferred back out of the ICU on 02/01/2024.  CT scan on 01/28/2024 demonstrated pneumonia in the left lower lobe predominance, cholelithiasis with calcified gallbladder, and emphysema. EEG completed on 01/29/2024 demonstrated no seizure-like activity.  Palliative  medicine consulted for goals of care conversation.  Met with patient, who shares that overall she has been doing well she continues to be able to do things she wants overall but notes that she has limitations due to her COPD, and would like to continue current medical interventions to prolong her life.  SUMMARY OF RECOMMENDATIONS   Full code, full scope despite encouragement and education on DNR given her severe COPD Continue current measures, patient remains open to all available and offered interventions to prolong her life at this time, patient does share that she would not want her life to be prolonged on life support if there were no reasonable way to get her off Next of kin decision maker is her sister Donna Watkins Order placed to have HCPOA documentation done to name her sister Donna Watkins as primary healthcare decision-maker Palliative medicine will continue to follow  Symptom Management:  Per primary team  Code Status: Full Code  Prognosis:  Unable to determine  Discharge Planning:  To Be Determined   Discussed with: Danford MD 02/01/2024 the patient would like to remain full code at this time and to continue current measures.  Subjective:   Reviewed  medical records, received report from team, assessed the patient and then meet at the patient's bedside to discuss diagnosis, prognosis, GOC, EOL wishes disposition and options.  I met with the patient at bedside without any visitors, later reached out to the patient's sister Donna Watkins.   We meet to discuss diagnosis prognosis, GOC, EOL wishes, disposition and options. Concept of Palliative Care was introduced as specialized medical care for people and their families living with serious illness.  If focuses on providing relief from the symptoms and stress of a serious illness.  The goal is to improve quality of life for both the patient and the family. Values and goals of care important to patient and family  were attempted to be elicited.  Created space and opportunity for patient  and family to explore thoughts and feelings regarding current medical situation   Natural trajectory and current clinical status were discussed. Questions and concerns addressed. Patient encouraged to call with questions or concerns.    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, she is very well aware of her limitations given her severe COPD, but shares that the quality of life that she currently has is acceptable  Life Review: - Patient shares that she is divorced and has a daughter Donna Watkins - She is there that she used to work at a medical sales representative, that this was her most enjoyable job that she worked there for about 11 to 12 years - Patient shares that she enjoys watching NASCAR races and she used to like bowling when she was physically able to  Baseline Status: -Is live with her sister since 2024, who is her primary caregiver, shares that her sister also has some health issues including arthritis, but overall they are able to manage together and cares for each other - Patient and sister shares that she has not has much trouble over the last year since her prior hospitalization in 2024, she is still able to perform activities of daily living including showering, dressing herself, and toileting without any assistance at home - Patient is able to ambulate without any assistive device, but is aware that the distance she can walk is limited by her COPD - She shares that she is able to do some light cooking such as heating up meals, or making cold meals like sandwiches - She is still able to go shopping with her sister, but requires to be on a electric scooter, and she is not able to reach for products from the shelves to fill her cart - Sister shares  that patient has not been able to cook a hot meal for very many years due to her COPD - Patient reports that her appetite has not been good over the last couple of weeks to months, but she denies any weight changes or weight loss  Today's Discussion: - Reviewed with the patient her overall health prior to admission, current admission, and next steps - Patient continues to share that she overall does not have any complaints over current quality of life, she still able to do what she wants, and that her current quality of life is very acceptable to her at this point and her sister -Patient shares that her current symptoms are very well-controlled with her medications and her nebulizers, she shares that her nebulizers remain very effective, but she does share that she needs  her nebulizers 2-3 times a day - Also reviewed her chronic Xanax  use, she shares that she averages about 2-4 times a day which helps her immensely for shortness of breath episodes but also for sleep, she shares that she has been taking Xanax  for about 15 years and it is prescribed by her primary care doctor -Reviewed with her that her hypercapnic episode 2 days ago was most likely due to her benzodiazepine use while in the hospital, shared with her that the medications that she received in the hospital is a little bit different and longer acting than the Xanax  that she normally uses spray contributed to her obtunded state - Reviewed her current admission including antibiotic use, and anxiolytics including Ativan  which may have resulted in her obtunded state on 01/30/2024 resulting in rapid response requiring her to wear a BiPAP for hypercapnia - Reviewed possible pathways for the patient including hospice or continued aggressive medical interventions - Overall stating that the patient wants to remain as independent as possible and functional as possible, and her goal is to continue to be open to medical interventions to prolong her life -  Reviewed CODE STATUS with the patient, shared with her that the medical recommendation given her severe COPD would be a DO NOT RESUSCITATE, given the low likelihood of successful resuscitation, and that the aggressive interventions would cause more harm than good, patient was not able to make decision at this time she shares that this is very difficult for her to consider that she will have more time to discuss with her family, reviewed that she will remain full code at this time - Reviewed healthcare decision maker for the patient in case she were to lose capacity, she shares that she would like her sister Donna Watkins to be next kin decision maker as she is her best friend and does very well with the patient would like going forward, patient open to completing advance directive paperwork to name her sister Donna as primary healthcare decision-maker -Urged patient to get in to have discussion with her family in regards to her healthcare in case things look for to deteriorate in the future and if the medical team is unable to get her well enough to a state where her quality of life is acceptable  - Spoke on phone with the patient's sister who reiterates the same sentiment that the patient has been doing over the last year since her last hospitalization 2024, she is able to do what she wants, and has not had many health issues over the last year - Sister also shared that the patient prior to admission had not been wearing her BiPAP for couple of days when she went to bed which may have resulted in her obtunded and altered mentation prior to hospitalization - Reviewed with the patient sister that she would like to name her as healthcare power of attorney going forward, she is agreeable to having that responsibility going forward as her sister is her best friend and she knows for better than anyone, and she is aware of what her sister would like, but I encouraged her to continue to have further  discussions to consider situations where if the patient does not return to an acceptable quality of life in the future if she gets sick again  Goals: - To return home  Review of Systems  Constitutional:  Positive for appetite change.    Objective:   Primary Diagnoses: Present on Admission:  Acute respiratory failure with hypoxia and hypercapnia (  HCC)  Acute encephalopathy  Protein-calorie malnutrition, moderate  Vital Signs:  BP 137/72 (BP Location: Right Arm)   Pulse (!) 102   Temp 98.9 F (37.2 C) (Oral)   Resp 20   Ht 5' 3 (1.6 m)   Wt 45.1 kg   LMP 05/06/2010   SpO2 96%   BMI 17.61 kg/m   Physical Exam Constitutional:      Comments: Chronically thin and frail appearing  HENT:     Head: Normocephalic and atraumatic.     Nose: Nose normal.     Mouth/Throat:     Mouth: Mucous membranes are dry.  Eyes:     Extraocular Movements: Extraocular movements intact.  Cardiovascular:     Rate and Rhythm: Normal rate.     Pulses: Normal pulses.  Pulmonary:     Effort: Pulmonary effort is normal.  Musculoskeletal:     Cervical back: Normal range of motion.  Skin:    General: Skin is warm and dry.  Neurological:     Mental Status: She is alert and oriented to person, place, and time.     Comments: Oriented to self, situation, and location.  Psychiatric:        Mood and Affect: Mood normal.     Palliative Assessment/Data: 50%   Thank you for allowing us  to participate in the care of Shaleigh E Lyter PMT will continue to support holistically.  I personally spent a total of 75 minutes in the care of the patient today including preparing to see the patient, getting/reviewing separately obtained history, performing a medically appropriate exam/evaluation, counseling and educating, placing orders, referring and communicating with other health care professionals, and documenting clinical information in the EHR.  Signed by: Fairy FORBES Shan DEVONNA Palliative Medicine  Team  Team Phone # [phone] (Nights/Weekends)  02/01/2024, 10:22 AM   "

## 2024-02-01 NOTE — Telephone Encounter (Signed)
 Outpatient follow up requested

## 2024-02-01 NOTE — Progress Notes (Signed)
" °   02/01/24 2348  BiPAP/CPAP/SIPAP  $ Face Mask Medium Yes  BiPAP/CPAP/SIPAP Pt Type Adult  BiPAP/CPAP/SIPAP Resmed  Mask Type Full face mask  Mask Size Medium  Respiratory Rate 18 breaths/min  Flow Rate 5 lpm (lpm)  Patient Home Machine No  Patient Home Mask No  Patient Home Tubing No  Auto Titrate Yes  Minimum cmH2O 8 cmH2O  Maximum cmH2O 20 cmH2O  CPAP/SIPAP surface wiped down Yes  Device Plugged into RED Power Outlet Yes  BiPAP/CPAP /SiPAP Vitals  Bilateral Breath Sounds Diminished    "

## 2024-02-01 NOTE — Progress Notes (Signed)
 "  NAME:  Donna Watkins, MRN:  [id number], DOB:  [birth year], LOS: 5 ADMISSION DATE:  01/27/2024, CONSULTATION DATE:  01/27/24 REFERRING MD:  Towana , CHIEF COMPLAINT:  AMS  History of Present Illness:   64 yo F PMH copd, chronic hypoxia on 4L, anxiety,chronic sacral wound, hx SCC lung s.p radiation,  who presented to [facility] ED 01/27/24 via EMS for AMS. Apparently family tried to awaken patient and reported sz like activity. She was hypoxic sats in 70s on her home 4L with EMS. EtCO2 was also high w EMS. In ED she was short of breath and altered. She was reportedly following commands. PCCM was called for transfer to [facility] & accepted in this setting  Prior to transfer, the patient apparently had a seizure vs sz like activity in CT & was intubated. It looks like she was given 1g keppra     Pertinent  Medical History  COPD Chronic hypoxic and hypercarbic respiratory failure  SCC lung s/p XRT  Significant Hospital Events: Including procedures, antibiotic start and stop dates in addition to other pertinent events   1/22 ?sz like activity at [facility]. Intubated. Txf to [facility]  1/24 txf out of ICU 1/25 rapid response: unresponsive after pt removed her own oxygen. Bagged w transient improvement in mentation then became minimally responsive again. Txf to ICU  1/27 transfer out of ICU  Interim History / Subjective:  Patient is feeling at her baseline.  Currently on 4-5 L of oxygen.  No other overnight events.   Objective    Blood pressure 137/72, pulse (!) 102, temperature 98 F (36.7 C), temperature source Oral, resp. rate 18, height 5' 3 (1.6 m), weight 45.1 kg, last menstrual period 05/06/2010, SpO2 96%.        Intake/Output Summary (Last 24 hours) at 02/01/2024 1256 Last data filed at 02/01/2024 1200 Gross per 24 hour  Intake 407.78 ml  Output 800 ml  Net -392.22 ml   Filed Weights   01/30/24 1915 01/31/24 0500 02/01/24 0452  Weight: 44.9 kg 44.9 kg 45.1 kg    Examination: General: Elderly  female not in distress. Lungs: clear to auscultation bilaterally.  Heart: regular rate rhythm, no murmur appreciated.  Abdomen: non tender, non distended. Normal BS.  Neuro: Alert and oriented x 3.  Moving all extremities.   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  Continue.  Has finished 5 days of steroids and has been discontinued. - Continue with ceftriaxone .  Flu and COVID-negative. - Patient follows Dr. Darlean as an outpatient.  Will request for outpatient follow-up. - Consider palliative care involvement. - She says she already has a BiPAP at home.  Noncompliant.  Encouraged to use.  Pulmonary will sign off.  Please call with questions.  Labs   CBC: Recent Labs  Lab 01/27/24 1014 01/27/24 1454 01/28/24 0348 01/29/24 0504 01/30/24 0546 01/30/24 2354 01/31/24 0056 02/01/24 0250  WBC 8.3   < > 5.6 10.4 11.0*  --  6.1 5.7  NEUTROABS 7.7  --   --   --   --   --   --   --   HGB 12.8   < > 9.4* 10.3* 11.0* 11.6* 10.8* 11.2*  HCT 46.1*   < > 32.2* 34.0* 36.7 34.0* 36.6 37.7  MCV 108.5*   < >  105.2* 101.2* 101.7*  --  101.9* 102.2*  PLT 113*   < > 74* 89* 124*  --  105* 126*   < > = values in this interval not displayed.    Basic Metabolic Panel: Recent Labs  Lab 01/27/24 2040 01/28/24 0348 01/29/24 0504 01/30/24 0546 01/30/24 2354 01/31/24 0056 02/01/24 0250  NA 145 140 139 145 143 144  --   K 3.8 3.8 4.1 3.7 4.0 4.0  --   CL 96* 99 99 98  --  95*  --   CO2 35* 33* 32 41*  --  43*  --   GLUCOSE 111* 108* 88 96  --  158*  --   BUN 20 18 15 19   --  23  --   CREATININE 0.51 0.54 0.37* 0.37*  --  0.37*  --   CALCIUM  8.5* 8.3* 9.0 8.9  --  8.5*  --   MG 2.4  --  1.7 1.8  --  2.3 1.7  PHOS  --   --  2.2* 2.0*  --  2.3* 2.1*    GFR: Estimated Creatinine Clearance: 51.9 mL/min (A) (by C-G formula based on SCr of 0.37 mg/dL (L)). Recent Labs  Lab 01/27/24 1015 01/27/24 1454 01/29/24 0504 01/30/24 0546 01/31/24 0056 02/01/24 0250  WBC  --    < > 10.4 11.0* 6.1 5.7  LATICACIDVEN 0.6  --   --   --   --   --    < > = values in this interval not displayed.    Liver Function Tests: Recent Labs  Lab 01/27/24 1014 01/31/24 0056  AST 22 26  ALT 16 21  ALKPHOS 83 62  BILITOT 0.7 0.3  PROT 7.1 5.9*  ALBUMIN 4.4 3.6    Total care time: 45 minutes   Care time was exclusive of separately billable procedures and treating other patients.  Care was necessary to treat or prevent imminent or life-threatening deterioration.   Care was time spent personally by me on the following activities: development of treatment plan with patient and/or surrogate as well as nursing, discussions with consultants, evaluation of patient's response to treatment, examination of patient, obtaining history from patient or surrogate, ordering and performing treatments and interventions, ordering and review of laboratory studies, ordering and review of radiographic studies and pulse oximetry.   Sammi JONETTA Fredericks, MD Pulmonary, Critical Care and Sleep Attending.   02/01/2024, 3:55 PM    "

## 2024-02-01 NOTE — Progress Notes (Signed)
 " Progress Note   Patient: Donna Watkins FMW:[id number] DOB: Dec 16, 1960 DOA: 01/27/2024     5 DOS: the patient was seen and examined on 02/01/2024 at 9:01 AM and 2:30 PM      Brief hospital course: 64 y.o. F with COPD and CRF on 4L home O2, FEV1 28% in 2018, hx squamous lung CA T1c s/p radiation in 2024, in remission, hx Chiari 1 malformation, anxiety on daily Xanax  and malnutrition who presented with SOB and seizure-like activity.  Intubated in the [facility] ER and transferred to [name] ICU.  Neurology consulted, LTM EEG negative.   Significant events: 1/22: Intubated and admitted to [facility] ICU from [facility]; Neurology consulted 1/23: Extubated 1/24: LTM EEG without epileptiform activity, Neurology doubt seizures, transferred OOU 1/25: Became encephalopathic in afternoon, found on floor with O2 off, pCO2 >120, transferred back to ICU on BiPAP 1/26: Mentation resolved after BiPAP overnight, transferred back OOU 1/27: Stable, pending placement        Assessment and Plan: Acute on chronic hypoxic hypercapnic respiratory failure due to acute COPD exacerbation Suspected pneumonia FEV1 28% and FVC 50% as far back as 2018.  CPAP dependent at night at home.  Baseline bicarb 40 and pCO2 ~90  See time course above.  Intubated on admission, quickly extubated but nearly intubated again on hospital day 4.    Still VERY tight.  There is legitimate concern that benzodiazepines (take Xanax  1 mg five times daily at home) suppressing respiratory drive  - Continue Solu-Medrol  40 BID - Continue bronchodilators, Brovana , Pulmicort , and Yupelri  - Continue Rocephin   - Consult Palliative Care    Acute metabolic encephalopathy Due to respiratory failure, likely alprazolam  withdrawal also  Benzodiazepine dependence Anxiety Takes Xanax  1 mg five times daily at home.  High risk for Xanax  withdrawal.  At admission, CCM transitioned from Xanax  to Lorazepam  to facilitate slow taper.  Became obtunded,  transferred back to ICU and benzodiazepines stopped.  Started on mirtazapine  and sertraline   Agree with benzodiazepine cessation, but patient is not on board with plan, and she is high risk for Xanax  withdrawal - Start clonazepam  0.5 mg once daily, plan to taper - Start gabapentin  100 mg TID - Stop Xanax  - Consult Palliative Care - Continue new mirtazaipne and sertraline    Seizure-like activity at presentation Evaluated by neurology.  Brain MRI normal.  Long-term EEG showed no epileptiform activity.  Neurology suspect this was myoclonic jerking in the setting of hypoxia, not seizure.  I am not so sure given her high dose Xanax .  Chiari I malformation   Hypernatremia Resolved with fluids   Moderate protein calorie malnutrition -Continue Ensure  Sacrum pressure injury, stage I present on admission -Sacral foam - Every 2 turns  Hyperlipidemia - Hold Lipitor  Thrombocytopenia Mild, improving           Subjective: Transferred back out of unit today.  Tells me she does not want to go to rehab.  Still appears mildly encephalopathic.  No fever.  Reports breathing is fiune. Palliative saw today.     Physical Exam: BP 132/85 (BP Location: Left Arm)   Pulse 65   Temp 98.5 F (36.9 C) (Oral)   Resp 20   Ht 5' 3 (1.6 m)   Wt 45.1 kg   LMP 05/06/2010   SpO2 99%   BMI 17.61 kg/m   Elderly adult female, lying in bed, appears weak and tired Tachycardic, no murmurs, no peripheral edema Respiratory effort seems increased, using accessory muscles, lung  sounds extremely diminished, no wheezing appreciated Abdomen soft, no tenderness to palpation  Oriented to self, hospital, but short-term memory seems impaired.  Upper extremity strength weak, lower extremity strength weak.  Symmetric.    Data Reviewed: BMP, CBC and ABG reviewed.  Echo normal.       Disposition: Status is: Inpatient 64 y.o. F with end-stage COPD, admitted with COPD flare, having poor recovery.   Palliative care consulted.  She is very weak and unable to stand without assistance, will need rehabilitation.  If her functional status improves in the coming days such that she can engage with physical therapy, she may be able to discharge to rehab by this weekend.        Author: Lonni SHAUNNA Dalton, MD 02/01/2024 6:33 PM  For on call review [email or web address].    "

## 2024-02-02 DIAGNOSIS — J9601 Acute respiratory failure with hypoxia: Secondary | ICD-10-CM | POA: Diagnosis not present

## 2024-02-02 DIAGNOSIS — J9602 Acute respiratory failure with hypercapnia: Secondary | ICD-10-CM | POA: Diagnosis not present

## 2024-02-02 LAB — BASIC METABOLIC PANEL WITH GFR
BUN: 13 mg/dL (ref 8–23)
CO2: >45 mmol/L — ABNORMAL HIGH (ref 22–32)
Calcium: 9 mg/dL (ref 8.9–10.3)
Chloride: 94 mmol/L — ABNORMAL LOW (ref 98–111)
Creatinine, Ser: 0.4 mg/dL — ABNORMAL LOW (ref 0.44–1.00)
GFR, Estimated: >60 mL/min
Glucose, Bld: 127 mg/dL — ABNORMAL HIGH (ref 70–99)
Potassium: 4.8 mmol/L (ref 3.5–5.1)
Sodium: 146 mmol/L — ABNORMAL HIGH (ref 135–145)

## 2024-02-02 LAB — CBC
HCT: 39.1 % (ref 36.0–46.0)
Hemoglobin: 11.7 g/dL — ABNORMAL LOW (ref 12.0–15.0)
MCH: 30.4 pg (ref 26.0–34.0)
MCHC: 29.9 g/dL — ABNORMAL LOW (ref 30.0–36.0)
MCV: 101.6 fL — ABNORMAL HIGH (ref 80.0–100.0)
Platelets: 143 10*3/uL — ABNORMAL LOW (ref 150–400)
RBC: 3.85 MIL/uL — ABNORMAL LOW (ref 3.87–5.11)
RDW: 11.9 % (ref 11.5–15.5)
WBC: 5.8 10*3/uL (ref 4.0–10.5)
nRBC: 0 % (ref 0.0–0.2)

## 2024-02-02 LAB — PHOSPHORUS: Phosphorus: 3.8 mg/dL (ref 2.5–4.6)

## 2024-02-02 MED ORDER — LACTULOSE 10 GM/15ML PO SOLN
20.0000 g | Freq: Two times a day (BID) | ORAL | Status: AC
  Administered 2024-02-02 (×2): 20 g via ORAL
  Filled 2024-02-02 (×2): qty 30

## 2024-02-02 MED ORDER — PREDNISONE 20 MG PO TABS
40.0000 mg | ORAL_TABLET | Freq: Every day | ORAL | Status: DC
  Administered 2024-02-03 – 2024-02-04 (×2): 40 mg via ORAL
  Filled 2024-02-02 (×2): qty 2

## 2024-02-02 MED ORDER — ALPRAZOLAM 0.5 MG PO TABS
0.5000 mg | ORAL_TABLET | Freq: Two times a day (BID) | ORAL | Status: DC | PRN
  Administered 2024-02-02 – 2024-02-03 (×4): 0.5 mg via ORAL
  Filled 2024-02-02 (×4): qty 1

## 2024-02-02 MED ORDER — METHYLPREDNISOLONE SODIUM SUCC 40 MG IJ SOLR
40.0000 mg | Freq: Two times a day (BID) | INTRAMUSCULAR | Status: AC
  Administered 2024-02-02: 40 mg via INTRAVENOUS
  Filled 2024-02-02: qty 1

## 2024-02-02 NOTE — Care Management Important Message (Signed)
 Important Message  Patient Details  Name: Donna Watkins MRN: [id number] Date of Birth: 03/01/1960   Important Message Given:  Yes - Medicare IM     Vonzell Arrie Sharps 02/02/2024, 12:08 PM

## 2024-02-02 NOTE — Progress Notes (Signed)
 Physical Therapy Treatment Patient Details Name: Donna Watkins MRN: [id number] DOB: 02/01/1960 Today's Date: 02/02/2024   History of Present Illness Pt is 64 yo presenting to [facility] on 1/22 due to pt was transferred from [facility] ED via EMS for AMS. Pt was short of breathe in ED with altered mental status with seizure vs. Seizure like activity in CT and was intubated. Pt was transferred out of the ICU on 1/24 and then back to ICU on 1/25 due to unresponsive with significant increase in CO2 levels. Pt was transitioned off BIBPAP over night of 1/25. PMH: chronic hypoxia on 4L O2 at home, anxiety, chronic sacral wound, hx SCC lunc s/p radiation.    PT Comments  Pt tolerated session well, ambulating household distances with AD and no physical assistance, but heavy verbal cueing for sequencing, and safety as pt attempts to sit without ensuring stable surface is behind her, and bumped into objects multiple times throughout session despite cues for scanning. Pt also continues to require cues for activity pacing, as pt often trying to push through BLE fatigue and worsening dyspnea, requiring prompting from therapist for seated rest break. PT will continue to treat pt while she is admitted. Patient will benefit from continued inpatient follow up therapy, <3 hours/day.    If plan is discharge home, recommend the following: A little help with walking and/or transfers;Assist for transportation;Assistance with cooking/housework;Supervision due to cognitive status;Help with stairs or ramp for entrance   Can travel by private vehicle     No  Equipment Recommendations  Other (comment) (defer to next setting)    Recommendations for Other Services       Precautions / Restrictions Precautions Precautions: Fall Recall of Precautions/Restrictions: Impaired Precaution/Restrictions Comments: Multiple times throughout session, pt attempting to sit or mobilize without prompting from therapist, leaving  walker behind, gettign caught up in lines/leads. Restrictions Weight Bearing Restrictions Per Provider Order: No     Mobility  Bed Mobility Overal bed mobility: Needs Assistance Bed Mobility: Supine to Sit     Supine to sit: Supervision     General bed mobility comments: supervision for safety    Transfers Overall transfer level: Needs assistance Equipment used: Rolling walker (2 wheels) Transfers: Sit to/from Stand Sit to Stand: Contact guard assist           General transfer comment: Pt completed 6 STS from recliner and 1 STS from EOB with RW and no physical assistance. Pt requires cueing for UE placement, pushing up from surface with LUE and stabilizing walker with RUE. Increased time to complete.    Ambulation/Gait Ambulation/Gait assistance: Contact guard assist, +2 safety/equipment Gait Distance (Feet): 60 Feet (x2 with seated rest break in between secondary to faituge and feeling SOB) Assistive device: Rolling walker (2 wheels) Gait Pattern/deviations: Step-through pattern, Drifts right/left, Decreased stride length, Trunk flexed, Narrow base of support Gait velocity: reduced Gait velocity interpretation: <1.31 ft/sec, indicative of household ambulator   General Gait Details: Pt demonstrates reciprocal gait pattern with anterior trunk lean and narrowed BOS. Pt with difficulty increasing step width despite frequent cueing from therapist. Pt drifts R/L, occasionally running into objects in hallway and in room despite cueing from therapist for scanning environment.   Stairs             Wheelchair Mobility     Tilt Bed    Modified Rankin (Stroke Patients Only)       Balance Overall balance assessment: Needs assistance Sitting-balance support: No upper extremity supported,  Feet supported Sitting balance-Leahy Scale: Fair Sitting balance - Comments: Pt able to don bilateral socks with no LOB or signs of instability   Standing balance support:  Bilateral upper extremity supported, During functional activity, Reliant on assistive device for balance Standing balance-Leahy Scale: Poor Standing balance comment: reliant on external support for improved stability                            Communication Communication Communication: No apparent difficulties  Cognition Arousal: Alert Behavior During Therapy: Impulsive   PT - Cognitive impairments: Problem solving, Safety/Judgement, Awareness                       PT - Cognition Comments: Multiple times throughout session, pt attempting to sit or mobilize without prompting from therapist, leaving walker behind, gettign caught up in lines/leads. Following commands: Impaired Following commands impaired: Follows one step commands inconsistently    Cueing Cueing Techniques: Verbal cues, Gestural cues, Tactile cues  Exercises      General Comments General comments (skin integrity, edema, etc.): SpO2 90-94% on 4L while ambulating      Pertinent Vitals/Pain Pain Assessment Pain Assessment: No/denies pain    Home Living                          Prior Function            PT Goals (current goals can now be found in the care plan section) Acute Rehab PT Goals Patient Stated Goal: to get better PT Goal Formulation: With patient Time For Goal Achievement: 02/14/24 Potential to Achieve Goals: Fair Progress towards PT goals: Progressing toward goals    Frequency    Min 2X/week      PT Plan      Co-evaluation              AM-PAC PT 6 Clicks Mobility   Outcome Measure  Help needed turning from your back to your side while in a flat bed without using bedrails?: A Little Help needed moving from lying on your back to sitting on the side of a flat bed without using bedrails?: A Little Help needed moving to and from a bed to a chair (including a wheelchair)?: A Little Help needed standing up from a chair using your arms (e.g., wheelchair  or bedside chair)?: A Little Help needed to walk in hospital room?: A Little Help needed climbing 3-5 steps with a railing? : Total 6 Click Score: 16    End of Session Equipment Utilized During Treatment: Gait belt;Oxygen Activity Tolerance: Patient tolerated treatment well Patient left: in chair;with call bell/phone within reach;with chair alarm set;with nursing/sitter in room Nurse Communication: Mobility status PT Visit Diagnosis: Other abnormalities of gait and mobility (R26.89);Unsteadiness on feet (R26.81);Muscle weakness (generalized) (M62.81)     Time: 8496-8473 PT Time Calculation (min) (ACUTE ONLY): 23 min  Charges:    $Gait Training: 8-22 mins $Therapeutic Activity: 8-22 mins PT General Charges $$ ACUTE PT VISIT: 1 Visit                     Leontine Hilt DPT Acute Rehab Services [phone] Prefer contact via chat    Leontine NOVAK Inessa Wardrop 02/02/2024, 5:09 PM

## 2024-02-02 NOTE — TOC Progression Note (Addendum)
 Transition of Care [facility]) - Progression Note    Patient Details  Name: Donna Watkins MRN: [id number] Date of Birth: 26-Aug-1960  Transition of Care [facility]) CM/SW Contact  [name], [place] Phone Number: 02/02/2024, 10:22 AM  Clinical Narrative:   CSW followed up with patient at bedside and provided medicare.gov ratings for accepting facilities. Patient chose [facility] at this time. CSW to start auth and inquire with MD about if patient will need bipap at facility. Per chart review, pt prefers nasal cannula.  11:21 AM Per MD, patient will need bipap at facility. CSW updated facility.  1:51 PM Auth pending, id [id number].  CSW will continue to follow.    Expected Discharge Plan: Skilled Nursing Facility Barriers to Discharge: Continued Medical Work up, English As A Second Language Teacher               Expected Discharge Plan and Services In-house Referral: Clinical Social Work   Post Acute Care Choice: Skilled Nursing Facility Living arrangements for the past 2 months: Single Family Home                                       Social Drivers of Health (SDOH) Interventions SDOH Screenings   Food Insecurity: Patient Declined (01/29/2024)  Housing: Patient Declined (01/29/2024)  Transportation Needs: Patient Declined (01/29/2024)  Utilities: Patient Declined (01/29/2024)  Alcohol Screen: Low Risk (06/23/2022)  Depression (PHQ2-9): Low Risk (08/07/2022)  Financial Resource Strain: Low Risk (08/03/2022)  Physical Activity: Insufficiently Active (08/03/2022)   Received from [facility]  Social Connections: Unknown (01/29/2024)  Stress: No Stress Concern Present (08/03/2022)  Tobacco Use: Medium Risk (01/29/2024)  Health Literacy: Low Risk (08/03/2022)   Received [facility]    Readmission Risk Interventions    03/27/2022    1:15 PM 02/06/2022    2:58 PM  Readmission Risk Prevention Plan  Transportation Screening Complete Complete  Home Care Screening  Complete   Medication Review (RN CM)  Complete  HRI or Home Care Consult Complete   Social Work Consult for Recovery Care Planning/Counseling Complete   Palliative Care Screening Not Applicable   Medication Review Oceanographer) Complete

## 2024-02-02 NOTE — Progress Notes (Signed)
 " PROGRESS NOTE    Donna Watkins  FMW:[id number] DOB: [birth year] DOA: 01/27/2024 PCP: Shona Norleen PEDLAR, MD  64 yo F PMH of end-stage copd, chronic hypoxia on 4L, anxiety,chronic sacral wound, hx SCC lung s.p radiation,  who presented to [facility] ED 01/27/24 via EMS for AMS. She was hypoxic sats in 70s on her home 4L with EMS. EtCO2 was also high w EMS. In ED she was short of breath and altered had a questionable seizure-like activity and CT and was intubated, admitted to ICU, transferred to [facility] -1/23: Extubated - 1/24 EEG negative for seizures, transferred out of ICU - 1/25, unresponsive after patient removed her own oxygen, bagged with transient improvement in mentation, became minimally responsive again, transferred back to ICU treated with BiPAP - Transferred back to [facility] on 1/26 - Palliative care meeting 1/27, wishes for full scope of treatment - 1/28 agreed to DNR  Subjective: -Feels a little better overall, breathing continues to improve  Assessment and Plan:  Acute on chronic hypoxic hypercapnic respiratory failure/COPD exacerbation End-stage COPD Suspected pneumonia FEV1 28% and FVC 50% as far back as 2018.  CPAP dependent at night at home.  Baseline bicarb 40 and pCO2 ~90 -See time course above.  Intubated on admission, quickly extubated but nearly intubated again on hospital day 4.   -Clinically improving, cut down IV steroids today, switch to prednisone  taper tomorrow - Continue bronchodilators, Brovana , Pulmicort , and Yupelri  - Will complete 7-day course of ceftriaxone  today - Palliative care consulted and following, sp may yesterday, wished for full code and full scope -I discussed this again today, she agrees to DNR  -Encouraged compliance with nightly BiPAP   Acute metabolic encephalopathy Due to respiratory failure, likely alprazolam  withdrawal also   Benzodiazepine dependence Anxiety Takes Xanax  1 mg 3-5 times daily at home. -High risk for Xanax  withdrawal.  At admission,  CCM transitioned from Xanax  to Lorazepam  to facilitate slow taper.  Became obtunded, transferred back to ICU and benzodiazepines stopped.  Started on mirtazapine  and sertraline   -Agree with need for much lower dose of benzodiazepines, but patient has been taking high-dose Xanax  for many years and is not agreeable to be tapered off now - Restarted on lower dose Xanax  0.5 mg twice daily as needed - Consulted Palliative Care - Continue mirtazapine     Seizure-like activity at presentation Evaluated by neurology.  Brain MRI normal.  Long-term EEG showed no epileptiform activity.  Neurology suspect this was myoclonic jerking in the setting of hypoxia, not seizure.   - Could have had a component of benzo withdrawal as well   Chiari I malformation    Hypernatremia Resolved with fluids    Moderate protein calorie malnutrition -Continue Ensure   Sacrum pressure injury, stage I present on admission -Sacral foam - Every 2 turns   Hyperlipidemia - Hold Lipitor   Thrombocytopenia Mild, improving   DVT prophylaxis: Heparin  subcutaneous Code Status: DNR Family Communication: None present Disposition Plan: SNF soon  Consultants:    Procedures:   Antimicrobials:    Objective: Vitals:   02/01/24 2300 02/02/24 0300 02/02/24 0720 02/02/24 0737  BP: (!) 143/74 121/70  (!) 140/73  Pulse: 92 73  80  Resp: 20 18 18 20   Temp: 98.4 F (36.9 C) 98.7 F (37.1 C)  98.5 F (36.9 C)  TempSrc: Oral Oral  Oral  SpO2: 100% 99%  95%  Weight:  44.6 kg    Height:  5' 3 (1.6 m)      Intake/Output Summary (  Last 24 hours) at 02/02/2024 1147 Last data filed at 02/02/2024 0700 Gross per 24 hour  Intake 737.17 ml  Output 1150 ml  Net -412.83 ml   Filed Weights   01/31/24 0500 02/01/24 0452 02/02/24 0300  Weight: 44.9 kg 45.1 kg 44.6 kg    Examination:  General exam: Appears calm and comfortable chronically ill-appearing, AAO x 3 Respiratory system: Poor air movement  bilaterally Cardiovascular system: S1 & S2 heard, RRR.  Abd: nondistended, soft and nontender.Normal bowel sounds heard. Central nervous system: Alert and oriented. No focal neurological deficits. Extremities: no edema Skin: No rashes Psychiatry: Flat affect    Data Reviewed:   CBC: Recent Labs  Lab 01/27/24 1014 01/27/24 1454 01/29/24 0504 01/30/24 0546 01/30/24 2354 01/31/24 0056 02/01/24 0250 02/02/24 0251  WBC 8.3   < > 10.4 11.0*  --  6.1 5.7 5.8  NEUTROABS 7.7  --   --   --   --   --   --   --   HGB 12.8   < > 10.3* 11.0* 11.6* 10.8* 11.2* 11.7*  HCT 46.1*   < > 34.0* 36.7 34.0* 36.6 37.7 39.1  MCV 108.5*   < > 101.2* 101.7*  --  101.9* 102.2* 101.6*  PLT 113*   < > 89* 124*  --  105* 126* 143*   < > = values in this interval not displayed.   Basic Metabolic Panel: Recent Labs  Lab 01/27/24 2040 01/28/24 0348 01/29/24 0504 01/30/24 0546 01/30/24 2354 01/31/24 0056 02/01/24 0250 02/02/24 0251  NA 145 140 139 145 143 144  --  146*  K 3.8 3.8 4.1 3.7 4.0 4.0  --  4.8  CL 96* 99 99 98  --  95*  --  94*  CO2 35* 33* 32 41*  --  43*  --  >45*  GLUCOSE 111* 108* 88 96  --  158*  --  127*  BUN 20 18 15 19   --  23  --  13  CREATININE 0.51 0.54 0.37* 0.37*  --  0.37*  --  0.40*  CALCIUM  8.5* 8.3* 9.0 8.9  --  8.5*  --  9.0  MG 2.4  --  1.7 1.8  --  2.3 1.7  --   PHOS  --   --  2.2* 2.0*  --  2.3* 2.1* 3.8   GFR: Estimated Creatinine Clearance: 51.3 mL/min (A) (by C-G formula based on SCr of 0.4 mg/dL (L)). Liver Function Tests: Recent Labs  Lab 01/27/24 1014 01/31/24 0056  AST 22 26  ALT 16 21  ALKPHOS 83 62  BILITOT 0.7 0.3  PROT 7.1 5.9*  ALBUMIN 4.4 3.6   No results for input(s): LIPASE, AMYLASE in the last 168 hours. Recent Labs  Lab 01/27/24 1019  AMMONIA 32   Coagulation Profile: No results for input(s): INR, PROTIME in the last 168 hours. Cardiac Enzymes: No results for input(s): CKTOTAL, CKMB, CKMBINDEX, TROPONINI in the  last 168 hours. BNP (last 3 results) No results for input(s): PROBNP in the last 8760 hours. HbA1C: No results for input(s): HGBA1C in the last 72 hours. CBG: Recent Labs  Lab 01/28/24 0753 01/28/24 1123 01/30/24 1745 01/30/24 2020 01/31/24 0830  GLUCAP 86 126* 184* 128* 148*   Lipid Profile: No results for input(s): CHOL, HDL, LDLCALC, TRIG, CHOLHDL, LDLDIRECT in the last 72 hours. Thyroid  Function Tests: No results for input(s): TSH, T4TOTAL, FREET4, T3FREE, THYROIDAB in the last 72 hours. Anemia Panel: No results for  input(s): VITAMINB12, FOLATE, FERRITIN, TIBC, IRON, RETICCTPCT in the last 72 hours. Urine analysis:    Component Value Date/Time   COLORURINE YELLOW 01/27/2024 1029   APPEARANCEUR CLEAR 01/27/2024 1029   LABSPEC 1.023 01/27/2024 1029   PHURINE 5.0 01/27/2024 1029   GLUCOSEU NEGATIVE 01/27/2024 1029   HGBUR SMALL (A) 01/27/2024 1029   BILIRUBINUR NEGATIVE 01/27/2024 1029   KETONESUR NEGATIVE 01/27/2024 1029   PROTEINUR 100 (A) 01/27/2024 1029   UROBILINOGEN 1.0 06/13/2011 0148   NITRITE NEGATIVE 01/27/2024 1029   LEUKOCYTESUR NEGATIVE 01/27/2024 1029   Sepsis Labs: @LABRCNTIP (procalcitonin:4,lacticidven:4)  ) Recent Results (from the past 240 hours)  Culture, blood (Routine X 2) w Reflex to ID Panel     Status: Abnormal   Collection Time: 01/27/24 10:20 AM   Specimen: BLOOD  Result Value Ref Range Status   Specimen Description   Final    BLOOD LEFT ANTECUBITAL Performed at [facility], [address], [place], [place] [address]    Special Requests   Final    BOTTLES DRAWN AEROBIC AND ANAEROBIC Blood Culture results may not be optimal due to an inadequate volume of blood received in culture bottles Performed at [facility], [address] [place], [place] [address]    Culture  Setup Time   Final    AEROBIC BOTTLE ONLY GRAM POSITIVE COCCI Gram Stain Report Called to,Read Back By and Verified With: A.  PUGH ON 01/28/2024 @12 :11PM BY T. HAMER  PHARMD GSABRA BRAZIER [phone] @ 1923 FH    Culture (A)  Final    STAPHYLOCOCCUS CAPITIS THE SIGNIFICANCE OF ISOLATING THIS ORGANISM FROM A SINGLE SET OF BLOOD CULTURES WHEN MULTIPLE SETS ARE DRAWN IS UNCERTAIN. PLEASE NOTIFY THE MICROBIOLOGY DEPARTMENT WITHIN ONE WEEK IF SPECIATION AND SENSITIVITIES ARE REQUIRED. Performed at [facility], [address], [place], [place] [address]    Report Status 01/29/2024 FINAL  Final  Blood Culture ID Panel (Reflexed)     Status: Abnormal   Collection Time: 01/27/24 10:20 AM  Result Value Ref Range Status   Enterococcus faecalis NOT DETECTED NOT DETECTED Final   Enterococcus Faecium NOT DETECTED NOT DETECTED Final   Listeria monocytogenes NOT DETECTED NOT DETECTED Final   Staphylococcus species DETECTED (A) NOT DETECTED Final    Comment: CRITICAL RESULT CALLED TO, READ BACK BY AND VERIFIED WITH: MAYA JUDITHANN BRAZIER [phone] @ 1923 FH    Staphylococcus aureus (BCID) NOT DETECTED NOT DETECTED Final   Staphylococcus epidermidis NOT DETECTED NOT DETECTED Final   Staphylococcus lugdunensis NOT DETECTED NOT DETECTED Final   Streptococcus species NOT DETECTED NOT DETECTED Final   Streptococcus agalactiae NOT DETECTED NOT DETECTED Final   Streptococcus pneumoniae NOT DETECTED NOT DETECTED Final   Streptococcus pyogenes NOT DETECTED NOT DETECTED Final   A.calcoaceticus-baumannii NOT DETECTED NOT DETECTED Final   Bacteroides fragilis NOT DETECTED NOT DETECTED Final   Enterobacterales NOT DETECTED NOT DETECTED Final   Enterobacter cloacae complex NOT DETECTED NOT DETECTED Final   Escherichia coli NOT DETECTED NOT DETECTED Final   Klebsiella aerogenes NOT DETECTED NOT DETECTED Final   Klebsiella oxytoca NOT DETECTED NOT DETECTED Final   Klebsiella pneumoniae NOT DETECTED NOT DETECTED Final   Proteus species NOT DETECTED NOT DETECTED Final   Salmonella species NOT DETECTED NOT DETECTED Final   Serratia marcescens NOT  DETECTED NOT DETECTED Final   Haemophilus influenzae NOT DETECTED NOT DETECTED Final   Neisseria meningitidis NOT DETECTED NOT DETECTED Final   Pseudomonas aeruginosa NOT DETECTED NOT DETECTED Final  Stenotrophomonas maltophilia NOT DETECTED NOT DETECTED Final   Candida albicans NOT DETECTED NOT DETECTED Final   Candida auris NOT DETECTED NOT DETECTED Final   Candida glabrata NOT DETECTED NOT DETECTED Final   Candida krusei NOT DETECTED NOT DETECTED Final   Candida parapsilosis NOT DETECTED NOT DETECTED Final   Candida tropicalis NOT DETECTED NOT DETECTED Final   Cryptococcus neoformans/gattii NOT DETECTED NOT DETECTED Final    Comment: Performed at [facility], [address] [place], [place] [address]  Culture, blood (Routine X 2) w Reflex to ID Panel     Status: None   Collection Time: 01/27/24 10:52 AM   Specimen: BLOOD  Result Value Ref Range Status   Specimen Description BLOOD BLOOD LEFT HAND  Final   Special Requests   Final    BOTTLES DRAWN AEROBIC AND ANAEROBIC Blood Culture adequate volume   Culture   Final    NO GROWTH 5 DAYS Performed at [facility], [address], [place], [place] [address]    Report Status 02/01/2024 FINAL  Final  MRSA Next Gen by PCR, Nasal     Status: None   Collection Time: 01/27/24  2:40 PM   Specimen: Nasal Mucosa; Nasal Swab  Result Value Ref Range Status   MRSA by PCR Next Gen NOT DETECTED NOT DETECTED Final    Comment: (NOTE) The GeneXpert MRSA Assay (FDA approved for NASAL specimens only), is one component of a comprehensive MRSA colonization surveillance program. It is not intended to diagnose MRSA infection nor to guide or monitor treatment for MRSA infections. Test performance is not FDA approved in patients less than 46 years old. Performed at [facility], [address], [place], [place] [address]   Respiratory (~20 pathogens) panel by PCR     Status: None   Collection Time: 01/27/24  3:38 PM   Specimen:  Nasopharyngeal Swab; Respiratory  Result Value Ref Range Status   Adenovirus NOT DETECTED NOT DETECTED Final   Coronavirus 229E NOT DETECTED NOT DETECTED Final    Comment: (NOTE) The Coronavirus on the Respiratory Panel, DOES NOT test for the novel  Coronavirus (2019 nCoV)    Coronavirus HKU1 NOT DETECTED NOT DETECTED Final   Coronavirus NL63 NOT DETECTED NOT DETECTED Final   Coronavirus OC43 NOT DETECTED NOT DETECTED Final   Metapneumovirus NOT DETECTED NOT DETECTED Final   Rhinovirus / Enterovirus NOT DETECTED NOT DETECTED Final   Influenza A NOT DETECTED NOT DETECTED Final   Influenza B NOT DETECTED NOT DETECTED Final   Parainfluenza Virus 1 NOT DETECTED NOT DETECTED Final   Parainfluenza Virus 2 NOT DETECTED NOT DETECTED Final   Parainfluenza Virus 3 NOT DETECTED NOT DETECTED Final   Parainfluenza Virus 4 NOT DETECTED NOT DETECTED Final   Respiratory Syncytial Virus NOT DETECTED NOT DETECTED Final   Bordetella pertussis NOT DETECTED NOT DETECTED Final   Bordetella Parapertussis NOT DETECTED NOT DETECTED Final   Chlamydophila pneumoniae NOT DETECTED NOT DETECTED Final   Mycoplasma pneumoniae NOT DETECTED NOT DETECTED Final    Comment: Performed at [facility], [address] [place], [place] [address]  Resp panel by RT-PCR (RSV, Flu A&B, Covid) Anterior Nasal Swab     Status: None   Collection Time: 01/27/24  3:38 PM   Specimen: Anterior Nasal Swab  Result Value Ref Range Status   SARS Coronavirus 2 by RT PCR NEGATIVE NEGATIVE Final   Influenza A by PCR NEGATIVE NEGATIVE Final   Influenza B by  PCR NEGATIVE NEGATIVE Final    Comment: (NOTE) The Xpert Xpress SARS-CoV-2/FLU/RSV plus assay is intended as an aid in the diagnosis of influenza from Nasopharyngeal swab specimens and should not be used as a sole basis for treatment. Nasal washings and aspirates are unacceptable for Xpert Xpress SARS-CoV-2/FLU/RSV testing.  Fact Sheet for  Patients: bloggercourse.com  Fact Sheet for Healthcare Providers: seriousbroker.it  This test is not yet approved or cleared by the [place]  FDA and has been authorized for detection and/or diagnosis of SARS-CoV-2 by FDA under an Emergency Use Authorization (EUA). This EUA will remain in effect (meaning this test can be used) for the duration of the COVID-19 declaration under Section 564(b)(1) of the Act, 21 U.S.C. section 360bbb-3(b)(1), unless the authorization is terminated or revoked.     Resp Syncytial Virus by PCR NEGATIVE NEGATIVE Final    Comment: (NOTE) Fact Sheet for Patients: bloggercourse.com  Fact Sheet for Healthcare Providers: seriousbroker.it  This test is not yet approved or cleared by the [place]  FDA and has been authorized for detection and/or diagnosis of SARS-CoV-2 by FDA under an Emergency Use Authorization (EUA). This EUA will remain in effect (meaning this test can be used) for the duration of the COVID-19 declaration under Section 564(b)(1) of the Act, 21 U.S.C. section 360bbb-3(b)(1), unless the authorization is terminated or revoked.  Performed at [facility], [address], [place], [place] [address]   Culture, Respiratory w Gram Stain     Status: None   Collection Time: 01/27/24  3:38 PM   Specimen: Tracheal Aspirate; Respiratory  Result Value Ref Range Status   Specimen Description TRACHEAL ASPIRATE  Final   Special Requests NONE  Final   Gram Stain   Final    FEW WBC PRESENT, PREDOMINANTLY PMN FEW GRAM POSITIVE COCCI IN PAIRS IN CHAINS    Culture   Final    ABUNDANT Consistent with normal respiratory flora. Performed at [facility], [address], [place], [place] [address]    Report Status 01/31/2024 FINAL  Final     Radiology Studies: DG CHEST PORT 1 VIEW Result Date: 02/01/2024 EXAM: 1 VIEW(S) XRAY OF THE  CHEST 02/01/2024 04:17:00 PM COMPARISON: 01/27/2024, 03/25/2022 CT chest 123/226 CLINICAL HISTORY: Chest crackles. FINDINGS: LUNGS AND PLEURA: Emphysema. Improving left retrocardiac opacity. Blunting of bilateral costophrenic angles with possible trace pleural effusions. No pneumothorax. HEART AND MEDIASTINUM: Aortic arch atherosclerosis. No acute abnormality of the cardiac and mediastinal silhouettes. BONES AND SOFT TISSUES: No acute osseous abnormality. IMPRESSION: 1. Improving left retrocardiac opacity. 2. Blunting of bilateral costophrenic angles with possible trace pleural effusions. Electronically signed by: Morgane Naveau MD 02/01/2024 11:45 PM EST RP Workstation: [id number]   ECHOCARDIOGRAM COMPLETE Result Date: 02/01/2024    ECHOCARDIOGRAM REPORT   Patient Name:   OLIVETTE BECKMANN Date of Exam: 02/01/2024 Medical Rec #:  [id number]         Height:       63.0 in Accession #:    [id number]        Weight:       99.4 lb Date of Birth:  [birth year]        BSA:          1.437 m Patient Age:    63 years          BP:           137/72 mmHg Patient Gender: F  HR:           87 bpm. Exam Location:  Inpatient Procedure: 2D Echo, Cardiac Doppler and Color Doppler (Both Spectral and Color            Flow Doppler were utilized during procedure). Indications:    Other cardiac sounds R01.2  History:        Patient has prior history of Echocardiogram examinations, most                 recent 11/26/2021. COPD and hx of cancer; Risk                 Factors:Dyslipidemia, Former Smoker and GERD.  Sonographer:    Koleen Popper RDCS Referring Phys: [id number] CHRISTOPHER P DANFORD  Sonographer Comments: Image acquisition challenging due to COPD and Image acquisition challenging due to patient body habitus. IMPRESSIONS  1. Left ventricular ejection fraction, by estimation, is 60 to 65%. The left ventricle has normal function. The left ventricle has no regional wall motion abnormalities. Left ventricular diastolic  parameters were normal.  2. Right ventricular systolic function is normal. The right ventricular size is normal.  3. The mitral valve is normal in structure. No evidence of mitral valve regurgitation.  4. The aortic valve is tricuspid. Aortic valve regurgitation is mild to moderate. No aortic stenosis is present.  5. The inferior vena cava is dilated in size with >50% respiratory variability, suggesting right atrial pressure of 8 mmHg.  6. Cannot exclude a small PFO. There is a small patent foramen ovale with predominantly left to right shunting across the atrial septum. Comparison(s): No significant change from prior study. Prior images reviewed side by side. FINDINGS  Left Ventricle: Left ventricular ejection fraction, by estimation, is 60 to 65%. The left ventricle has normal function. The left ventricle has no regional wall motion abnormalities. The left ventricular internal cavity size was normal in size. There is  no left ventricular hypertrophy. Left ventricular diastolic parameters were normal. Right Ventricle: The right ventricular size is normal. No increase in right ventricular wall thickness. Right ventricular systolic function is normal. Left Atrium: Left atrial size was normal in size. Right Atrium: Right atrial size was normal in size. Pericardium: There is no evidence of pericardial effusion. Mitral Valve: The mitral valve is normal in structure. No evidence of mitral valve regurgitation. Tricuspid Valve: The tricuspid valve is normal in structure. Tricuspid valve regurgitation is not demonstrated. Aortic Valve: The aortic valve is tricuspid. Aortic valve regurgitation is mild to moderate. Aortic regurgitation PHT measures 143 msec. No aortic stenosis is present. Pulmonic Valve: The pulmonic valve was grossly normal. Pulmonic valve regurgitation is not visualized. No evidence of pulmonic stenosis. Aorta: The aortic root is normal in size and structure. Venous: The inferior vena cava is dilated in  size with greater than 50% respiratory variability, suggesting right atrial pressure of 8 mmHg. IAS/Shunts: Cannot exclude a small PFO. A small patent foramen ovale is detected with predominantly left to right shunting across the atrial septum.  LEFT VENTRICLE PLAX 2D LVIDd:         5.00 cm      Diastology LVIDs:         3.40 cm      LV e' medial:    6.85 cm/s LV PW:         0.80 cm      LV E/e' medial:  9.6 LV IVS:        0.70 cm  LV e' lateral:   12.60 cm/s LVOT diam:     2.10 cm      LV E/e' lateral: 5.2 LV SV:         65 LV SV Index:   46 LVOT Area:     3.46 cm  LV Volumes (MOD) LV vol d, MOD A2C: 138.0 ml LV vol d, MOD A4C: 92.8 ml LV vol s, MOD A2C: 59.9 ml LV vol s, MOD A4C: 42.3 ml LV SV MOD A2C:     78.1 ml LV SV MOD A4C:     92.8 ml LV SV MOD BP:      64.3 ml RIGHT VENTRICLE             IVC RV Basal diam:  3.80 cm     IVC diam: 2.30 cm RV S prime:     20.10 cm/s TAPSE (M-mode): 2.4 cm LEFT ATRIUM             Index        RIGHT ATRIUM           Index LA diam:        2.30 cm 1.60 cm/m   RA Area:     11.70 cm LA Vol (A2C):   31.1 ml 21.65 ml/m  RA Volume:   27.40 ml  19.07 ml/m LA Vol (A4C):   15.3 ml 10.65 ml/m LA Biplane Vol: 21.6 ml 15.03 ml/m  AORTIC VALVE LVOT Vmax:   106.00 cm/s LVOT Vmean:  68.100 cm/s LVOT VTI:    0.189 m AI PHT:      143 msec  AORTA Ao Root diam: 3.30 cm MITRAL VALVE MV Area (PHT): 3.91 cm    SHUNTS MV Decel Time: 194 msec    Systemic VTI:  0.19 m MV E velocity: 65.70 cm/s  Systemic Diam: 2.10 cm MV A velocity: 81.30 cm/s MV E/A ratio:  0.81 Mihai Croitoru MD Electronically signed by Jerel Balding MD Signature Date/Time: 02/01/2024/4:50:36 PM    Final      Scheduled Meds:  arformoterol   15 mcg Nebulization BID   atorvastatin   20 mg Oral Daily   budesonide  (PULMICORT ) nebulizer solution  0.5 mg Nebulization BID   clonazePAM   0.5 mg Oral Daily   feeding supplement  237 mL Oral TID BM   folic acid   1 mg Oral Daily   gabapentin   100 mg Oral TID   heparin   5,000  Units Subcutaneous Q8H   lactulose   20 g Oral BID   methylPREDNISolone  (SOLU-MEDROL ) injection  40 mg Intravenous Q12H   mirtazapine   15 mg Oral Daily   multivitamin with minerals  1 tablet Oral Daily   pantoprazole   40 mg Oral Daily   polyethylene glycol  17 g Oral Daily   revefenacin   175 mcg Nebulization Daily   senna  1 tablet Oral BID   sertraline   50 mg Oral Daily   thiamine   100 mg Oral Daily   Continuous Infusions:  cefTRIAXone  (ROCEPHIN )  IV 2 g (02/01/24 1724)     LOS: 6 days    Time spent:                                Sigurd Pac, MD [facility]   02/02/2024, 11:47 AM    "

## 2024-02-02 NOTE — Plan of Care (Signed)
  Problem: Clinical Measurements: Goal: Diagnostic test results will improve Outcome: Progressing Goal: Cardiovascular complication will be avoided Outcome: Progressing   Problem: Elimination: Goal: Will not experience complications related to bowel motility Outcome: Progressing Goal: Will not experience complications related to urinary retention Outcome: Progressing

## 2024-02-03 DIAGNOSIS — J9601 Acute respiratory failure with hypoxia: Secondary | ICD-10-CM | POA: Diagnosis not present

## 2024-02-03 DIAGNOSIS — J9602 Acute respiratory failure with hypercapnia: Secondary | ICD-10-CM | POA: Diagnosis not present

## 2024-02-03 LAB — BLOOD GAS, VENOUS
Acid-Base Excess: 26.3 mmol/L — ABNORMAL HIGH (ref 0.0–2.0)
Bicarbonate: 57.5 mmol/L — ABNORMAL HIGH (ref 20.0–28.0)
Drawn by: 4956
O2 Saturation: 66.7 %
Patient temperature: 36.9
pCO2, Ven: 95 mmHg (ref 44–60)
pH, Ven: 7.39 (ref 7.25–7.43)
pO2, Ven: 38 mmHg (ref 32–45)

## 2024-02-03 LAB — BASIC METABOLIC PANEL WITH GFR
BUN: 17 mg/dL (ref 8–23)
CO2: >45 mmol/L — ABNORMAL HIGH (ref 22–32)
Calcium: 8.8 mg/dL — ABNORMAL LOW (ref 8.9–10.3)
Chloride: 91 mmol/L — ABNORMAL LOW (ref 98–111)
Creatinine, Ser: 0.36 mg/dL — ABNORMAL LOW (ref 0.44–1.00)
GFR, Estimated: >60 mL/min
Glucose, Bld: 134 mg/dL — ABNORMAL HIGH (ref 70–99)
Potassium: 4.1 mmol/L (ref 3.5–5.1)
Sodium: 145 mmol/L (ref 135–145)

## 2024-02-03 MED ORDER — PREDNISONE 20 MG PO TABS: 20.0000 mg | ORAL_TABLET | Freq: Every day | ORAL | Status: DC

## 2024-02-03 MED ORDER — FOLIC ACID 1 MG PO TABS: 1.0000 mg | ORAL_TABLET | Freq: Every day | ORAL | Status: AC

## 2024-02-03 MED ORDER — MIRTAZAPINE 15 MG PO TABS: 15.0000 mg | ORAL_TABLET | Freq: Every day | ORAL | Status: AC

## 2024-02-03 MED ORDER — SENNA 8.6 MG PO TABS: 1.0000 | ORAL_TABLET | Freq: Two times a day (BID) | ORAL | Status: AC

## 2024-02-03 MED ORDER — TRELEGY ELLIPTA 200-62.5-25 MCG/ACT IN AEPB: 1.0000 | INHALATION_SPRAY | Freq: Two times a day (BID) | RESPIRATORY_TRACT | Status: AC

## 2024-02-03 MED ORDER — ALPRAZOLAM 0.5 MG PO TABS: 0.5000 mg | ORAL_TABLET | Freq: Three times a day (TID) | ORAL | 0 refills | Status: DC | PRN

## 2024-02-03 NOTE — Progress Notes (Signed)
 Mobility Specialist Progress Note:    02/03/24 1420  Mobility  Activity Turned to back - supine (Ankle Pumps, Leg Ext, Heel Slides)  Level of Assistance Standby assist, set-up cues, supervision of patient - no hands on  Assistive Device None  Range of Motion/Exercises Active Assistive  Activity Response Tolerated poorly;Other (Comment) (Pt seemed more confused and having difficulty w/ direction)  Mobility Referral Yes  Mobility visit 1 Mobility  Mobility Specialist Start Time (ACUTE ONLY) 1420  Mobility Specialist Stop Time (ACUTE ONLY) 1429  Mobility Specialist Time Calculation (min) (ACUTE ONLY) 9 min     02/03/24 1420  Mobility  Activity Turned to back - supine (Ankle Pumps, Leg Ext, Heel Slides)  Level of Assistance Standby assist, set-up cues, supervision of patient - no hands on  Assistive Device None  Range of Motion/Exercises Active Assistive  Activity Response Tolerated poorly;Other (Comment) (Pt seemed more confused and having difficulty w/ direction)  Mobility Referral Yes  Mobility visit 1 Mobility  Mobility Specialist Start Time (ACUTE ONLY) 1420  Mobility Specialist Stop Time (ACUTE ONLY) 1429  Mobility Specialist Time Calculation (min) (ACUTE ONLY) 9 min   Received pt laying in bed agreeable to session. No c/o any symptoms but pt seemed more confused and having difficulty processing directions. Pt able to perform movements w/ assist. Pt able to perform movements decently. Left pt in bed w/ all needs met.   Venetia Keel Mobility Specialist Please Neurosurgeon or Rehab Office at [phone]

## 2024-02-03 NOTE — Plan of Care (Signed)
" °  Problem: Education: Goal: Knowledge of General Education information will improve Description: Including pain rating scale, medication(s)/side effects and non-pharmacologic comfort measures Outcome: Progressing   Problem: Clinical Measurements: Goal: Ability to maintain clinical measurements within normal limits will improve Outcome: Progressing Goal: Will remain free from infection Outcome: Progressing Goal: Cardiovascular complication will be avoided Outcome: Progressing   Problem: Activity: Goal: Risk for activity intolerance will decrease Outcome: Progressing   Problem: Nutrition: Goal: Adequate nutrition will be maintained Outcome: Progressing   Problem: Coping: Goal: Level of anxiety will decrease Outcome: Progressing   Problem: Elimination: Goal: Will not experience complications related to bowel motility Outcome: Progressing Goal: Will not experience complications related to urinary retention Outcome: Progressing   Problem: Pain Managment: Goal: General experience of comfort will improve and/or be controlled Outcome: Progressing   Problem: Safety: Goal: Ability to remain free from injury will improve Outcome: Progressing   Problem: Skin Integrity: Goal: Risk for impaired skin integrity will decrease Outcome: Progressing   Problem: Safety: Goal: Non-violent Restraint(s) Outcome: Progressing   Problem: Clinical Measurements: Goal: Diagnostic test results will improve Outcome: Not Progressing Goal: Respiratory complications will improve Outcome: Not Progressing pCO2 95   Problem: Health Behavior/Discharge Planning: Goal: Ability to manage health-related needs will improve Outcome: Not Met (add Reason)   "

## 2024-02-03 NOTE — TOC Progression Note (Addendum)
 Transition of Care [facility]) - Progression Note    Patient Details  Name: Donna Watkins MRN: [id number] Date of Birth: Apr 28, 1960  [facility]) CM/SW Contact  [name], [place] Phone Number: 02/03/2024, 8:28 AM  Clinical Narrative:   CSW checked patients auth. Auth approved 02/02/2024-02/04/2024. CSW awaiting medical readiness and for SNF to order/obtain bipap for patient prior to DC.  10:12 AM Per progression, patients sitter was dc'd today. Per Jon with MFA facilities/[facility], patient will have to remain 24 hours free of a sitterr prior to discharging to facility. Facility also obtained bipap for patient.  CSW will continue to follow.    Expected Discharge Plan: Skilled Nursing Facility Barriers to Discharge: Continued Medical Work up, English As A Second Language Teacher               Expected Discharge Plan and Services In-house Referral: Clinical Social Work   Post Acute Care Choice: Skilled Nursing Facility Living arrangements for the past 2 months: Single Family Home                                       Social Drivers of Health (SDOH) Interventions SDOH Screenings   Food Insecurity: Patient Declined (01/29/2024)  Housing: Patient Declined (01/29/2024)  Transportation Needs: Patient Declined (01/29/2024)  Utilities: Patient Declined (01/29/2024)  Alcohol Screen: Low Risk (06/23/2022)  Depression (PHQ2-9): Low Risk (08/07/2022)  Financial Resource Strain: Low Risk (08/03/2022)  Physical Activity: Insufficiently Active (08/03/2022)   Received from [facility]  Social Connections: Unknown (01/29/2024)  Stress: No Stress Concern Present (08/03/2022)  Tobacco Use: Medium Risk (01/29/2024)  Health Literacy: Low Risk (08/03/2022)   Received from [facility]    Readmission Risk Interventions    03/27/2022    1:15 PM 02/06/2022    2:58 PM  Readmission Risk Prevention Plan  Transportation Screening Complete Complete  Home Care Screening   Complete  Medication Review (RN CM)  Complete  HRI or Home Care Consult Complete   Social Work Consult for Recovery Care Planning/Counseling Complete   Palliative Care Screening Not Applicable   Medication Review Oceanographer) Complete

## 2024-02-03 NOTE — Progress Notes (Signed)
 Physical Therapy Treatment Patient Details Name: Donna Watkins MRN: [id number] DOB: 08-12-1960 Today's Date: 02/03/2024   History of Present Illness Pt is 64 yo presenting to [facility] on 1/22 due to pt was transferred from [facility] ED via EMS for AMS. Pt was short of breathe in ED with altered mental status with seizure vs. Seizure like activity in CT and was intubated. Pt was transferred out of the ICU on 1/24 and then back to ICU on 1/25 due to unresponsive with significant increase in CO2 levels. Pt was transitioned off BIBPAP over night of 1/25. PMH: chronic hypoxia on 4L O2 at home, anxiety, chronic sacral wound, hx SCC lunc s/p radiation.    PT Comments  Pt completed sequence of STS and short bouts of ambulation in room with AD and no physical assistance. Pt then participated in LE therex (seated marches and LAQs) with cues for technique and optimal mechanics. Throughout session, pt requiring heavy cueing for sequencing and safety as pt is impulsive, bumping into objects and attempting to mobilize without device or prompting. Of note, pt's cognition appears to be worse than in previous session. PT will continue to Watkins pt while she is admitted. Patient will benefit from continued inpatient follow up therapy, <3 hours/day.    If plan is discharge home, recommend the following: A little help with walking and/or transfers;Assist for transportation;Assistance with cooking/housework;Supervision due to cognitive status;Help with stairs or ramp for entrance   Can travel by private vehicle     No  Equipment Recommendations  Other (comment) (defer to next setting)    Recommendations for Other Services       Precautions / Restrictions Precautions Precautions: Fall Recall of Precautions/Restrictions: Impaired Precaution/Restrictions Comments: Pt attempting to mobilize without prompting from therapist, with therapist turning around to grab walker and pt is found standing up at  EOB. Restrictions Weight Bearing Restrictions Per Provider Order: No     Mobility  Bed Mobility Overal bed mobility: Needs Assistance Bed Mobility: Supine to Sit, Sit to Supine     Supine to sit: Supervision, HOB elevated Sit to supine: Supervision   General bed mobility comments: Supine to sit on L side of bed with HOB slightly elevated and no physical assistance. Sit to supine with HOB flat and no physical assistance.    Transfers Overall transfer level: Needs assistance Equipment used: Rolling walker (2 wheels) Transfers: Sit to/from Stand Sit to Stand: Contact guard assist           General transfer comment: Pt completed 1 STS from EOB without AD, impulsively standing without prompting from therapist, and then 3 STS from EOB with AD and no physical assistance. Pt often attempting to stand by pulling up on walker, or attempting to sit with BUE supported on walker requiring constant cueing for appropriate technique. Increased time to complete    Ambulation/Gait Ambulation/Gait assistance: Contact guard assist Gait Distance (Feet): 25 Feet (x3 with seated rest break in between) Assistive device: Rolling walker (2 wheels) Gait Pattern/deviations: Step-through pattern, Drifts right/left, Decreased stride length, Trunk flexed, Narrow base of support Gait velocity: reduced Gait velocity interpretation: <1.31 ft/sec, indicative of household ambulator   General Gait Details: Pt demonstrates reciprocal gait pattern with anterior trunk lean and reduced WB through walker, ambulating outside of its base. Pt often drifting R/L, bumping into objects in room.   Stairs             Wheelchair Mobility     Tilt Bed  Modified Rankin (Stroke Patients Only)       Balance Overall balance assessment: Needs assistance Sitting-balance support: No upper extremity supported, Feet supported Sitting balance-Leahy Scale: Good Sitting balance - Comments: seated EOB   Standing  balance support: Bilateral upper extremity supported, During functional activity, Reliant on assistive device for balance Standing balance-Leahy Scale: Poor Standing balance comment: reliant on external support for improved stability                            Communication Communication Communication: No apparent difficulties  Cognition Arousal: Alert Behavior During Therapy: Impulsive   PT - Cognitive impairments: Problem solving, Safety/Judgement, Awareness                       PT - Cognition Comments: Pt reporting recent incident in which someone told her she broke her oxygen and that she has never heard a sound like that before and then said, they apologize though so that's nice. Followed up with RN who said pt was found earlier attempting to wander in hallway, with nasal cannula and its extension tubing on, but is unsure of event pt is referring to. Following commands: Impaired Following commands impaired: Follows one step commands inconsistently    Cueing Cueing Techniques: Verbal cues, Gestural cues  Exercises General Exercises - Lower Extremity Long Arc Quad: AROM, Both, 10 reps Hip Flexion/Marching: AROM, Both, 10 reps    General Comments General comments (skin integrity, edema, etc.): SpO2 89% while ambulating on 4L. HR: 120s-144 bpm      Pertinent Vitals/Pain Pain Assessment Pain Assessment: No/denies pain    Home Living                          Prior Function            PT Goals (current goals can now be found in the care plan section) Acute Rehab PT Goals Patient Stated Goal: to get better PT Goal Formulation: With patient Time For Goal Achievement: 02/14/24 Potential to Achieve Goals: Fair Progress towards PT goals: Progressing toward goals    Frequency    Min 2X/week      PT Plan      Co-evaluation              AM-PAC PT 6 Clicks Mobility   Outcome Measure  Help needed turning from your back to  your side while in a flat bed without using bedrails?: A Little Help needed moving from lying on your back to sitting on the side of a flat bed without using bedrails?: A Little Help needed moving to and from a bed to a chair (including a wheelchair)?: A Little Help needed standing up from a chair using your arms (e.g., wheelchair or bedside chair)?: A Little Help needed to walk in hospital room?: A Little Help needed climbing 3-5 steps with a railing? : Total 6 Click Score: 16    End of Session Equipment Utilized During Treatment: Gait belt;Oxygen Activity Tolerance: Patient tolerated treatment well Patient left: in bed;with call bell/phone within reach;with bed alarm set Nurse Communication: Mobility status (pt's cognition seeming worse today) PT Visit Diagnosis: Other abnormalities of gait and mobility (R26.89);Unsteadiness on feet (R26.81);Muscle weakness (generalized) (M62.81)     Time: 8362-8341 PT Time Calculation (min) (ACUTE ONLY): 21 min  Charges:    $Therapeutic Activity: 8-22 mins PT General Charges $$ ACUTE PT VISIT: 1  Visit                     Leontine Hilt DPT Acute Rehab Services [phone] Prefer contact via chat    Leontine KATHEE Hilt 02/03/2024, 5:13 PM

## 2024-02-03 NOTE — Progress Notes (Signed)
 " Daily Progress Note   Date: 02/03/2024   Patient Name: Donna Watkins  DOB: February 28, 1960  MRN: [id number]  Age / Sex: 64 y.o., female  Attending Physician: Fairy Frames, MD Primary Care Physician: Shona Norleen PEDLAR, MD Admit Date: 01/27/2024 Length of Stay: 7 days  Reason for Follow-up: Establishing goals of care  Past Medical History:  Diagnosis Date   Anxiety    panic attacks   Cancer [facility])    COPD (chronic obstructive pulmonary disease) (HCC) 03/01/2012   Depression    Emphysema of lung (HCC)    GERD (gastroesophageal reflux disease)    History of radiation therapy    Left Lung- 12/09/20-12/19/20- Dr. Lynwood Nasuti   History of radiation therapy    Left lung- 05/19/22-05/26/22- Dr. Lynwood Nasuti   Hyperlipidemia    Mild scoliosis    Oxygen deficiency    Oxygen dependent     Assessment & Plan:   HPI/Patient Profile:   64 y.o. 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  Per H&P by [facility] nurse practitioner on 01/27/2024, patient was somnolent and hard to wake with reported seizure-like activity, and desaturation to the 70s on her home oxygen.  Patient was intubated on 1/22-1/23 for acute respiratory failure. Patient transferred out of the ICU on 1/24, but had a rapid response on 1/25 due to becoming obtunded mostly secondary to her benzodiazepine use resulting in hypercarbia and altered mentation. Patient placed on BiPAP with rapid improvement to her mentation transferred back out of the ICU on 02/01/2024.  CT scan on 01/28/2024 demonstrated pneumonia in the left lower lobe predominance, cholelithiasis with calcified gallbladder, and emphysema. EEG completed on 01/29/2024 demonstrated no seizure-like activity.   Palliative medicine consulted for goals  of care conversation.  SUMMARY OF RECOMMENDATIONS DNR-limited Continue current measures with hopes to participate in rehab and eventual return to home Documented HCPOA is [name] Living will in EMR, does not want life prolonged in state of incurable illness, unconsciousness without chance of recovery, or advanced dementia, patient would not want artificial feeding tube  Symptom Management:  Per primary team  Code Status: DNR - Limited (DNR/DNI)  Prognosis: Unable to determine  Discharge Planning: Short term rehab with goal of returning home  Subjective:   Subjective: Chart Reviewed. Updates received. Patient Assessed. Created space and opportunity for patient  and family to explore thoughts and feelings regarding current medical situation.  Today's Discussion:  Met with patient today and reviewed her code status and next steps regarding her discharge. Patient shared that she made her code status change and although it was hard for her, she thinks that it is the best thing for her going forward, and she hopes to have a couple of more years left. She is aware that it is hard to give a time frame but certainly I do not expect her to have any cardiac event any time soon and shared with her that although it is a difficult decision to make, it is a decision that the medical team agrees with.   Reviewed with her that the next step after leaving the hospital is to go to short term rehab so she can regain her strength while she has been in the hospital bed. Reviewed with her that every day she spends in bed  at the hospital she will need about 2 days of rehabilitation to regain her prior function. She expressed understanding but still expressed her worry and wonders why she is still in the hospital. Shared with her she is medically stable to discharge but a lot of time there are logistical issues that arise and is usually the problem that holds up patients from discharging.   Reviewed the  reason the patient came into the hospital this admission and shared with her that Jackolyn shared that the patient had not been wearing her CPAP for a couple of days and that it may have resulted in her admission. Reviewed with her that the medication she takes at home for her anxiety and to help her sleep may have also contributed to her deteriorating, she expressed understanding. Educated the importance of wearing her CPAP so that she can prevent another episode from occurring again and she expressed she will do her best to wear her CPAP.   Review of Systems  All other systems reviewed and are negative.   Objective:   Primary Diagnoses: Present on Admission:  Acute respiratory failure with hypoxia and hypercapnia (HCC)  Acute encephalopathy  Protein-calorie malnutrition, moderate   Vital Signs:  BP 124/73 (BP Location: Right Arm)   Pulse 92   Temp 98.5 F (36.9 C) (Oral)   Resp 16   Ht 5' 3 (1.6 m)   Wt 44.3 kg   LMP 05/06/2010   SpO2 98%   BMI 17.30 kg/m   Physical Exam Constitutional:      Comments: Thin appearing  HENT:     Head: Normocephalic and atraumatic.     Nose: Nose normal.     Mouth/Throat:     Mouth: Mucous membranes are dry.  Eyes:     Extraocular Movements: Extraocular movements intact.  Cardiovascular:     Rate and Rhythm: Normal rate.  Pulmonary:     Effort: Pulmonary effort is normal.     Comments: On 3 L/min [place] Skin:    General: Skin is warm and dry.  Neurological:     Mental Status: She is alert. Mental status is at baseline.  Psychiatric:        Mood and Affect: Mood normal.    Palliative Assessment/Data: 60%   Existing Vynca/ACP Documentation: HCPOA - [name], Living will in EMR  Thank you for allowing us  to participate in the care of Donna Watkins PMT will continue to support holistically.  I personally spent a total of 35 minutes in the care of the patient today including preparing to see the patient, performing a  medically appropriate exam/evaluation, counseling and educating, and documenting clinical information in the EHR.  Opal Dinning E Celine Dishman, PA-C  Palliative Medicine Team  Team Phone # [phone] (Nights/Weekends) 02/03/2024 10:10 AM  "

## 2024-02-03 NOTE — Plan of Care (Signed)

## 2024-02-04 LAB — BLOOD GAS, VENOUS
Acid-Base Excess: 26.2 mmol/L — ABNORMAL HIGH (ref 0.0–2.0)
Bicarbonate: 56.4 mmol/L — ABNORMAL HIGH (ref 20.0–28.0)
Drawn by: 8665
O2 Saturation: 76.3 %
Patient temperature: 36.8
pCO2, Ven: 84 mmHg (ref 44–60)
pH, Ven: 7.43 (ref 7.25–7.43)
pO2, Ven: 43 mmHg (ref 32–45)

## 2024-02-04 NOTE — Care Management Important Message (Signed)
 Important Message  Patient Details  Name: LIZBET CIRRINCIONE MRN: [id number] Date of Birth: 1960-02-08   Important Message Given:  Yes - Medicare IM     Vonzell Arrie Sharps 02/04/2024, 11:06 AM

## 2024-02-04 NOTE — Progress Notes (Signed)
 1600: Report successfully given to [facility] via phone.

## 2024-02-04 NOTE — TOC Transition Note (Signed)
 Transition of Care [facility]) - Discharge Note   Patient Details  Name: Donna Watkins MRN: [id number] Date of Birth: 1960-07-27  Transition of Care [facility]) CM/SW Contact:  [name], LCSWA Phone Number: 02/04/2024, 10:54 AM   Clinical Narrative:   Patient will DC to: [facility] Anticipated DC date: 02/04/24  Family notified: Patient stated she will notify family Transport by: [facility]   Per MD patient ready for DC to [facility]. RN to call report prior to discharge [phone] opt [phone]; going to room 610). RN, patient, patient's family, and facility notified of DC. Discharge Summary and FL2 sent to facility. DC packet on chart. Ambulance transport requested for patient 10:55 AM.   CSW will sign off for now as social work intervention is no longer needed. Please consult us  again if new needs arise.      Final next level of care: Skilled Nursing Facility Barriers to Discharge: Barriers Resolved   Patient Goals and CMS Choice Patient states their goals for this hospitalization and ongoing recovery are:: To try SNF STR CMS Medicare.gov Compare Post Acute Care list provided to:: Patient Choice offered to / list presented to : Patient [place] ownership interest in [facility].provided to:: Patient    Discharge Placement PASRR number recieved: 02/01/24            Patient [facility] bed at: [facility] Patient to be transferred to facility by: [facility] Name of family member notified: Patient informed CSW that she is going to notify family Patient and family notified of of transfer: 02/04/24  Discharge Plan and Services Additional resources added to the After Visit Summary for   In-house Referral: Clinical Social Work   Post Acute Care Choice: Skilled Nursing Facility                               Social Drivers of Health (SDOH) Interventions SDOH Screenings   Food Insecurity: Patient Declined (01/29/2024)  Housing: Patient  Declined (01/29/2024)  Transportation Needs: Patient Declined (01/29/2024)  Utilities: Patient Declined (01/29/2024)  Alcohol Screen: Low Risk (06/23/2022)  Depression (PHQ2-9): Low Risk (08/07/2022)  Financial Resource Strain: Low Risk (08/03/2022)  Physical Activity: Insufficiently Active (08/03/2022)   Received from [facility]  Social Connections: Unknown (01/29/2024)  Stress: No Stress Concern Present (08/03/2022)  Tobacco Use: Medium Risk (01/29/2024)  Health Literacy: Low Risk (08/03/2022)   Received from [facility]     Readmission Risk Interventions    03/27/2022    1:15 PM 02/06/2022    2:58 PM  Readmission Risk Prevention Plan  Transportation Screening Complete Complete  Home Care Screening  Complete  Medication Review (RN CM)  Complete  HRI or Home Care Consult Complete   Social Work Consult for Recovery Care Planning/Counseling Complete   Palliative Care Screening Not Applicable   Medication Review Oceanographer) Complete

## 2024-02-04 NOTE — Telephone Encounter (Signed)
 ATC X1. LMTCB

## 2024-02-04 NOTE — Progress Notes (Signed)
 1100: x3 attempts to provide report via phone to [facility] at [phone], opt 0. Each time after pressing option 0, no ring and call hung up.  Contacted Nursing Admitting Director and made her aware unable to get through on report line. She states she has sent a message to her team to have receiving nurse contact [facility] 3E unit to be connected to this RN for report.

## 2024-02-05 ENCOUNTER — Inpatient Hospital Stay (HOSPITAL_COMMUNITY)
Admission: EM | Admit: 2024-02-05 | Discharge: 2024-02-09 | DRG: 189 | Disposition: A | Discharge status: Skilled Nursing Facility | Attending: Family Medicine | Admitting: Family Medicine

## 2024-02-05 ENCOUNTER — Encounter (HOSPITAL_COMMUNITY): Payer: Self-pay

## 2024-02-05 ENCOUNTER — Emergency Department (HOSPITAL_COMMUNITY)

## 2024-02-05 DIAGNOSIS — Z8249 Family history of ischemic heart disease and other diseases of the circulatory system: Secondary | ICD-10-CM

## 2024-02-05 DIAGNOSIS — Z818 Family history of other mental and behavioral disorders: Secondary | ICD-10-CM

## 2024-02-05 DIAGNOSIS — G253 Myoclonus: Secondary | ICD-10-CM | POA: Diagnosis present

## 2024-02-05 DIAGNOSIS — M419 Scoliosis, unspecified: Secondary | ICD-10-CM | POA: Diagnosis present

## 2024-02-05 DIAGNOSIS — Z7983 Long term (current) use of bisphosphonates: Secondary | ICD-10-CM

## 2024-02-05 DIAGNOSIS — E440 Moderate protein-calorie malnutrition: Secondary | ICD-10-CM | POA: Diagnosis present

## 2024-02-05 DIAGNOSIS — J9621 Acute and chronic respiratory failure with hypoxia: Principal | ICD-10-CM | POA: Diagnosis present

## 2024-02-05 DIAGNOSIS — E785 Hyperlipidemia, unspecified: Secondary | ICD-10-CM | POA: Diagnosis present

## 2024-02-05 DIAGNOSIS — Z9981 Dependence on supplemental oxygen: Secondary | ICD-10-CM

## 2024-02-05 DIAGNOSIS — Z8261 Family history of arthritis: Secondary | ICD-10-CM

## 2024-02-05 DIAGNOSIS — J9622 Acute and chronic respiratory failure with hypercapnia: Secondary | ICD-10-CM | POA: Diagnosis present

## 2024-02-05 DIAGNOSIS — E870 Hyperosmolality and hypernatremia: Secondary | ICD-10-CM | POA: Diagnosis present

## 2024-02-05 DIAGNOSIS — R4182 Altered mental status, unspecified: Principal | ICD-10-CM

## 2024-02-05 DIAGNOSIS — G9341 Metabolic encephalopathy: Secondary | ICD-10-CM | POA: Diagnosis present

## 2024-02-05 DIAGNOSIS — I10 Essential (primary) hypertension: Secondary | ICD-10-CM | POA: Diagnosis present

## 2024-02-05 DIAGNOSIS — J439 Emphysema, unspecified: Secondary | ICD-10-CM | POA: Diagnosis present

## 2024-02-05 DIAGNOSIS — Z825 Family history of asthma and other chronic lower respiratory diseases: Secondary | ICD-10-CM

## 2024-02-05 DIAGNOSIS — L89151 Pressure ulcer of sacral region, stage 1: Secondary | ICD-10-CM | POA: Diagnosis present

## 2024-02-05 DIAGNOSIS — Z923 Personal history of irradiation: Secondary | ICD-10-CM

## 2024-02-05 DIAGNOSIS — R0902 Hypoxemia: Secondary | ICD-10-CM

## 2024-02-05 DIAGNOSIS — Z87891 Personal history of nicotine dependence: Secondary | ICD-10-CM

## 2024-02-05 DIAGNOSIS — F32A Depression, unspecified: Secondary | ICD-10-CM | POA: Diagnosis present

## 2024-02-05 DIAGNOSIS — Z91199 Patient's noncompliance with other medical treatment and regimen due to unspecified reason: Secondary | ICD-10-CM

## 2024-02-05 DIAGNOSIS — F419 Anxiety disorder, unspecified: Secondary | ICD-10-CM | POA: Diagnosis present

## 2024-02-05 DIAGNOSIS — F1320 Sedative, hypnotic or anxiolytic dependence, uncomplicated: Secondary | ICD-10-CM | POA: Diagnosis present

## 2024-02-05 DIAGNOSIS — Z66 Do not resuscitate: Secondary | ICD-10-CM | POA: Diagnosis present

## 2024-02-05 DIAGNOSIS — Z681 Body mass index (BMI) 19 or less, adult: Secondary | ICD-10-CM

## 2024-02-05 DIAGNOSIS — Z751 Person awaiting admission to adequate facility elsewhere: Secondary | ICD-10-CM

## 2024-02-05 DIAGNOSIS — D696 Thrombocytopenia, unspecified: Secondary | ICD-10-CM | POA: Diagnosis present

## 2024-02-05 LAB — URINALYSIS, ROUTINE W REFLEX MICROSCOPIC
Bilirubin Urine: NEGATIVE
Glucose, UA: NEGATIVE mg/dL
Hgb urine dipstick: NEGATIVE
Ketones, ur: NEGATIVE mg/dL
Leukocytes,Ua: NEGATIVE
Nitrite: NEGATIVE
Protein, ur: 30 mg/dL — AB
Specific Gravity, Urine: 1.024 (ref 1.005–1.030)
pH: 7 (ref 5.0–8.0)

## 2024-02-05 LAB — BLOOD GAS, ARTERIAL
Acid-Base Excess: 24.2 mmol/L — ABNORMAL HIGH (ref 0.0–2.0)
Bicarbonate: 53.1 mmol/L — ABNORMAL HIGH (ref 20.0–28.0)
Drawn by: 20012
O2 Content: 3.5 L/min
O2 Saturation: 98.7 %
Patient temperature: 36.9
pCO2 arterial: 80 mmHg (ref 32–48)
pH, Arterial: 7.43 (ref 7.35–7.45)
pO2, Arterial: 90 mmHg (ref 83–108)

## 2024-02-05 LAB — BASIC METABOLIC PANEL WITH GFR
BUN: 29 mg/dL — ABNORMAL HIGH (ref 8–23)
BUN: 30 mg/dL — ABNORMAL HIGH (ref 8–23)
CO2: >45 mmol/L — ABNORMAL HIGH (ref 22–32)
CO2: >45 mmol/L — ABNORMAL HIGH (ref 22–32)
Calcium: 10 mg/dL (ref 8.9–10.3)
Calcium: 9.8 mg/dL (ref 8.9–10.3)
Chloride: 95 mmol/L — ABNORMAL LOW (ref 98–111)
Chloride: 94 mmol/L — ABNORMAL LOW (ref 98–111)
Creatinine, Ser: 0.63 mg/dL (ref 0.44–1.00)
Creatinine, Ser: 0.64 mg/dL (ref 0.44–1.00)
GFR, Estimated: >60 mL/min
GFR, Estimated: >60 mL/min
Glucose, Bld: 127 mg/dL — ABNORMAL HIGH (ref 70–99)
Glucose, Bld: 100 mg/dL — ABNORMAL HIGH (ref 70–99)
Potassium: 3.9 mmol/L (ref 3.5–5.1)
Potassium: 4.2 mmol/L (ref 3.5–5.1)
Sodium: 146 mmol/L — ABNORMAL HIGH (ref 135–145)
Sodium: 147 mmol/L — ABNORMAL HIGH (ref 135–145)

## 2024-02-05 LAB — CBC
HCT: 43 % (ref 36.0–46.0)
Hemoglobin: 12.7 g/dL (ref 12.0–15.0)
MCH: 30.4 pg (ref 26.0–34.0)
MCHC: 29.5 g/dL — ABNORMAL LOW (ref 30.0–36.0)
MCV: 102.9 fL — ABNORMAL HIGH (ref 80.0–100.0)
Platelets: 243 10*3/uL (ref 150–400)
RBC: 4.18 MIL/uL (ref 3.87–5.11)
RDW: 12.3 % (ref 11.5–15.5)
WBC: 7.3 10*3/uL (ref 4.0–10.5)
nRBC: 0 % (ref 0.0–0.2)

## 2024-02-05 LAB — MRSA NEXT GEN BY PCR, NASAL: MRSA by PCR Next Gen: NOT DETECTED

## 2024-02-05 MED ORDER — SENNA 8.6 MG PO TABS
1.0000 | ORAL_TABLET | Freq: Two times a day (BID) | ORAL | Status: DC
  Administered 2024-02-06 – 2024-02-08 (×6): 8.6 mg via ORAL
  Filled 2024-02-05 (×7): qty 1

## 2024-02-05 MED ORDER — ALPRAZOLAM 0.5 MG PO TABS
0.5000 mg | ORAL_TABLET | Freq: Once | ORAL | Status: AC
  Administered 2024-02-05: 0.5 mg via ORAL
  Filled 2024-02-05: qty 1

## 2024-02-05 MED ORDER — PANTOPRAZOLE SODIUM 40 MG PO TBEC
40.0000 mg | DELAYED_RELEASE_TABLET | Freq: Every day | ORAL | Status: DC
  Administered 2024-02-05 – 2024-02-09 (×5): 40 mg via ORAL
  Filled 2024-02-05 (×5): qty 1

## 2024-02-05 MED ORDER — ENOXAPARIN SODIUM 30 MG/0.3ML IJ SOSY
30.0000 mg | PREFILLED_SYRINGE | Freq: Every day | INTRAMUSCULAR | Status: DC
  Administered 2024-02-05 – 2024-02-09 (×5): 30 mg via SUBCUTANEOUS
  Filled 2024-02-05 (×5): qty 0.3

## 2024-02-05 MED ORDER — BUDESON-GLYCOPYRROL-FORMOTEROL 160-9-4.8 MCG/ACT IN AERO: 2.0000 | INHALATION_SPRAY | Freq: Two times a day (BID) | RESPIRATORY_TRACT | Status: DC

## 2024-02-05 MED ORDER — ONDANSETRON HCL 4 MG PO TABS: 4.0000 mg | ORAL_TABLET | Freq: Four times a day (QID) | ORAL | Status: DC | PRN

## 2024-02-05 MED ORDER — ALPRAZOLAM 0.5 MG PO TABS
0.5000 mg | ORAL_TABLET | Freq: Three times a day (TID) | ORAL | Status: DC | PRN
  Administered 2024-02-05 – 2024-02-09 (×9): 0.5 mg via ORAL
  Filled 2024-02-05 (×10): qty 1

## 2024-02-05 MED ORDER — CHLORHEXIDINE GLUCONATE CLOTH 2 % EX PADS
6.0000 | MEDICATED_PAD | Freq: Every day | CUTANEOUS | Status: DC
  Administered 2024-02-05 – 2024-02-07 (×2): 6 via TOPICAL

## 2024-02-05 MED ORDER — PREDNISONE 20 MG PO TABS
40.0000 mg | ORAL_TABLET | Freq: Every day | ORAL | Status: DC
  Administered 2024-02-06 – 2024-02-09 (×4): 40 mg via ORAL
  Filled 2024-02-05 (×4): qty 2

## 2024-02-05 MED ORDER — IPRATROPIUM-ALBUTEROL 0.5-2.5 (3) MG/3ML IN SOLN
3.0000 mL | Freq: Once | RESPIRATORY_TRACT | Status: AC
  Administered 2024-02-05: 3 mL via RESPIRATORY_TRACT
  Filled 2024-02-05: qty 3

## 2024-02-05 MED ORDER — ONDANSETRON HCL 4 MG/2ML IJ SOLN: 4.0000 mg | Freq: Four times a day (QID) | INTRAMUSCULAR | Status: DC | PRN

## 2024-02-05 MED ORDER — BUDESON-GLYCOPYRROL-FORMOTEROL 160-9-4.8 MCG/ACT IN AERO
2.0000 | INHALATION_SPRAY | Freq: Two times a day (BID) | RESPIRATORY_TRACT | Status: DC
  Administered 2024-02-05 – 2024-02-06 (×2): 2 via RESPIRATORY_TRACT
  Filled 2024-02-05: qty 5.9

## 2024-02-05 MED ORDER — ATORVASTATIN CALCIUM 20 MG PO TABS
20.0000 mg | ORAL_TABLET | Freq: Every day | ORAL | Status: DC
  Administered 2024-02-06 – 2024-02-09 (×4): 20 mg via ORAL
  Filled 2024-02-05: qty 2
  Filled 2024-02-05 (×3): qty 1

## 2024-02-05 MED ORDER — ACETAMINOPHEN 650 MG RE SUPP: 650.0000 mg | Freq: Four times a day (QID) | RECTAL | Status: DC | PRN

## 2024-02-05 MED ORDER — SODIUM CHLORIDE 0.9 % IV SOLN: INTRAVENOUS | Status: DC

## 2024-02-05 MED ORDER — ACETAMINOPHEN 325 MG PO TABS: 650.0000 mg | ORAL_TABLET | Freq: Four times a day (QID) | ORAL | Status: DC | PRN

## 2024-02-05 MED ORDER — ALBUTEROL SULFATE (2.5 MG/3ML) 0.083% IN NEBU: 2.5000 mg | INHALATION_SOLUTION | RESPIRATORY_TRACT | Status: DC | PRN

## 2024-02-05 MED ORDER — FOLIC ACID 1 MG PO TABS
1.0000 mg | ORAL_TABLET | Freq: Every day | ORAL | Status: DC
  Administered 2024-02-06 – 2024-02-09 (×4): 1 mg via ORAL
  Filled 2024-02-05 (×4): qty 1

## 2024-02-05 NOTE — Plan of Care (Signed)

## 2024-02-05 NOTE — H&P (Signed)
 " History and Physical  Donna Watkins FMW:[id number] DOB: 1960/08/14 DOA: 02/05/2024  PCP: Shona Norleen PEDLAR, MD   Chief Complaint: Confusion, hypoxia  HPI: Donna Watkins is a 64 y.o. 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  She returns back from her facility with complaints of confusion, removing her oxygen which caused desaturation, nursing staff per EMS signout states that they are unable to manage her due to her confusion and not keeping on her oxygen.  Patient is somewhat confused, per nursing staff currently very pleasant but somewhat impulsive, taking off her oxygen, taking off her close, currently unable to tolerate BiPAP.  Discharge summary and extensive hospital course was reviewed, during her recent hospitalization, she was changed to DNR/DNI.  She was intubated on 1/22, extubated 1/23, on 1/25 she was unresponsive after she removed her own oxygen, and improved after she was transferred back to the ICU and treated with BiPAP.  On 1/28 she met with palliative care and agreed to DNR/DNI.  She was discharged back to facility yesterday 1/30 after completing 7 days of IV Rocephin , treated with bronchodilators and inhalers, discharged on prednisone  taper.  Significantly, she takes Xanax  total of 3 to 5 mg/day at home, she is at high risk for Xanax  withdrawal, during her last admission CCM had transitioned her from Xanax  to lorazepam  for slow taper, due to concern for obtundation benzodiazepines were stopped.  She was started on mirtazapine  during this hospitalization, and discharged from the hospital on Xanax  3 times daily as needed, with plan for slow taper.  Although the patient is pleasantly confused, she is answering  some questions appropriately, she denies any chest pain, cough, fevers, nausea, vomiting.  She is aware now that she is at the hospital, and is also able to tell me that she went to a nursing facility recently.  Review of Systems: Please see HPI for pertinent positives and negatives. A complete 10 system review of systems are otherwise negative.  Past Medical History:  Diagnosis Date   Anxiety    panic attacks   Cancer (HCC)    COPD (chronic obstructive pulmonary disease) (HCC) 03/01/2012   Depression    Emphysema of lung (HCC)    GERD (gastroesophageal reflux disease)    History of radiation therapy    Left Lung- 12/09/20-12/19/20- Dr. Lynwood Nasuti   History of radiation therapy    Left lung- 05/19/22-05/26/22- Dr. Lynwood Nasuti   Hyperlipidemia    Mild scoliosis    Oxygen deficiency    Oxygen dependent    Past Surgical History:  Procedure Laterality Date   BREAST BIOPSY Left 06/25/2022   MM LT BREAST BX W LOC DEV 1ST LESION IMAGE BX SPEC STEREO GUIDE 06/25/2022 [facility]   BRONCHIAL BIOPSY  11/04/2020   Procedure: BRONCHIAL BIOPSIES;  Surgeon: Shelah Lamar RAMAN, MD;  Location: [facility] ENDOSCOPY;  Service: Pulmonary;;   BRONCHIAL BRUSHINGS  11/04/2020   Procedure: BRONCHIAL BRUSHINGS;  Surgeon: Shelah Lamar RAMAN, MD;  Location: [facility] ENDOSCOPY;  Service: Pulmonary;;   BRONCHIAL NEEDLE ASPIRATION BIOPSY  11/04/2020   Procedure: BRONCHIAL NEEDLE ASPIRATION BIOPSIES;  Surgeon: Shelah Lamar RAMAN, MD;  Location: [facility] ENDOSCOPY;  Service: Pulmonary;;   COLONOSCOPY N/A 09/09/2012   Procedure: COLONOSCOPY;  Surgeon: Margo LITTIE Haddock, MD;  Location: [facility] ENDO SUITE;  Service: Endoscopy;  Laterality: N/A;  1:15-moved to 12:45 Melanie notified pt   COLONOSCOPY WITH PROPOFOL  N/A 11/09/2022   Procedure: COLONOSCOPY WITH PROPOFOL ;  Surgeon: Cindie Carlin POUR, DO;  Location: [facility] ENDO SUITE;  Service: Endoscopy;  Laterality: N/A;  845am, asa 3   FIDUCIAL MARKER PLACEMENT  11/04/2020   Procedure: FIDUCIAL  MARKER PLACEMENT;  Surgeon: Shelah Lamar RAMAN, MD;  Location: [facility] ENDOSCOPY;  Service: Pulmonary;;   POLYPECTOMY  11/09/2022   Procedure: POLYPECTOMY;  Surgeon: Cindie Carlin POUR, DO;  Location: [facility] ENDO SUITE;  Service: Endoscopy;;   TUBAL LIGATION     VIDEO BRONCHOSCOPY WITH ENDOBRONCHIAL NAVIGATION N/A 11/04/2020   Procedure: ROBOTIC  VIDEO BRONCHOSCOPY WITH ENDOBRONCHIAL NAVIGATION;  Surgeon: Shelah Lamar RAMAN, MD;  Location: [facility] ENDOSCOPY;  Service: Pulmonary;  Laterality: N/A;   Social History:  reports that she quit smoking about 12 years ago. Her smoking use included cigarettes. She started smoking about 42 years ago. She has a 75 pack-year smoking history. She has never used smokeless tobacco. She reports that she does not drink alcohol and does not use drugs.  Allergies[1]  Family History  Problem Relation Age of Onset   Heart disease Father    COPD Mother    Anxiety disorder Mother    Arthritis Sister    Colon cancer Neg Hx      Prior to Admission medications  Medication Sig Start Date End Date Taking? Authorizing Provider  albuterol  (VENTOLIN  HFA) 108 (90 Base) MCG/ACT inhaler Inhale 2 puffs into the lungs every 4 (four) hours as needed for wheezing or shortness of breath. 11/27/21   Johnson, Clanford L, MD  alendronate (FOSAMAX) 70 MG tablet Take 70 mg by mouth once a week. 09/10/22   [provider]  ALPRAZolam  (XANAX ) 0.5 MG tablet Take 1 tablet (0.5 mg total) by mouth 3 (three) times daily as needed for anxiety. 02/03/24   Fairy Frames, MD  atorvastatin  (LIPITOR) 20 MG tablet Take 1 tablet (20 mg total) by mouth daily. Restart on 04/03/22 04/03/22   Evonnie Lenis, MD  Fluticasone -Umeclidin-Vilant (TRELEGY ELLIPTA ) 200-62.5-25 MCG/ACT AEPB Inhale 1 puff into the lungs 2 (two) times daily. 02/03/24   Fairy Frames, MD  folic acid  (FOLVITE ) 1 MG tablet Take 1 tablet (1 mg total) by mouth daily. 02/03/24   Fairy Frames, MD  ipratropium-albuterol  (DUONEB) 0.5-2.5 (3) MG/3ML  SOLN Take 3 mLs by nebulization 3 (three) times daily. And as needed for SOB 11/27/21   Johnson, Clanford L, MD  mirtazapine  (REMERON ) 15 MG tablet Take 1 tablet (15 mg total) by mouth daily. 02/03/24   Fairy Frames, MD  Multiple Vitamins-Minerals (CENTRUM SILVER PO) Take 1 tablet by mouth daily.    [provider]  mupirocin ointment (BACTROBAN) 2 % Apply 1 Application topically. Patient not taking: Reported on 01/28/2024 07/01/22   [provider]  omeprazole (PRILOSEC) 20 MG capsule Take 20 mg by mouth daily.    [provider]  OXYGEN Place 3 L into the nose continuous.    [provider]  predniSONE  (DELTASONE ) 20 MG tablet Take 1-2 tablets (20-40 mg total) by mouth daily with breakfast. Take 40 mg for 2 days then 20 mg for 3 days then 10 mg for 3 days then stop 02/03/24   Joseph, Preetha, MD  senna (SENOKOT) 8.6 MG TABS tablet Take 1 tablet (8.6 mg total) by mouth 2 (two) times daily. 02/03/24   Fairy Frames, MD    Physical Exam: BP  139/83   Pulse 99   Temp 98.7 F (37.1 C) (Oral)   Resp (!) 23   LMP 05/06/2010   SpO2 100%  General:  Alert, oriented to self and place only, calm, in no acute distress but per nursing staff intermittently pleasantly agitated, taking off her close, removing her oxygen, etc Eyes: EOMI, clear conjuctivae, white sclerea Neck: supple, no masses, trachea mildline  Cardiovascular: RRR, no murmurs or rubs, no peripheral edema  Respiratory: clear to auscultation bilaterally, no wheezes, no crackles  Abdomen: soft, nontender, nondistended, normal bowel tones heard  Skin: dry, no rashes  Musculoskeletal: no joint effusions, normal range of motion  Psychiatric: appropriate affect, normal speech  Neurologic: extraocular muscles intact, clear speech, moving all extremities with intact sensorium         Labs on Admission:  Basic Metabolic Panel: Recent Labs  Lab 01/30/24 0546 01/30/24 2354 01/31/24 0056 02/01/24 0250  02/02/24 0251 02/03/24 0235 02/05/24 0451 02/05/24 0612  NA 145   < > 144  --  146* 145 146* 147*  K 3.7   < > 4.0  --  4.8 4.1 3.9 4.2  CL 98  --  95*  --  94* 91* 95* 94*  CO2 41*  --  43*  --  >45* >45* >45* >45*  GLUCOSE 96  --  158*  --  127* 134* 127* 100*  BUN 19  --  23  --  13 17 29* 30*  CREATININE 0.37*  --  0.37*  --  0.40* 0.36* 0.63 0.64  CALCIUM  8.9  --  8.5*  --  9.0 8.8* 10.0 9.8  MG 1.8  --  2.3 1.7  --   --   --   --   PHOS 2.0*  --  2.3* 2.1* 3.8  --   --   --    < > = values in this interval not displayed.   Liver Function Tests: Recent Labs  Lab 01/31/24 0056  AST 26  ALT 21  ALKPHOS 62  BILITOT 0.3  PROT 5.9*  ALBUMIN 3.6   No results for input(s): LIPASE, AMYLASE in the last 168 hours. No results for input(s): AMMONIA in the last 168 hours. CBC: Recent Labs  Lab 01/30/24 0546 01/30/24 2354 01/31/24 0056 02/01/24 0250 02/02/24 0251 02/05/24 0451  WBC 11.0*  --  6.1 5.7 5.8 7.3  HGB 11.0* 11.6* 10.8* 11.2* 11.7* 12.7  HCT 36.7 34.0* 36.6 37.7 39.1 43.0  MCV 101.7*  --  101.9* 102.2* 101.6* 102.9*  PLT 124*  --  105* 126* 143* 243   Cardiac Enzymes: No results for input(s): CKTOTAL, CKMB, CKMBINDEX, TROPONINI in the last 168 hours. BNP (last 3 results) No results for input(s): BNP in the last 8760 hours.  ProBNP (last 3 results) No results for input(s): PROBNP in the last 8760 hours.  CBG: Recent Labs  Lab 01/30/24 1745 01/30/24 2020 01/31/24 0830  GLUCAP 184* 128* 148*    Radiological Exams on Admission: DG Chest 2 View Result Date: 02/05/2024 EXAM: 2 VIEW(S) XRAY OF THE CHEST 02/05/2024 05:06:00 AM COMPARISON: 02/01/2024 CLINICAL HISTORY: Shortness of breath. FINDINGS: LUNGS AND PLEURA: Emphysema. Hyperinflation with flattened diaphragms, compatible with COPD. Blunting of right costophrenic angle. No focal pulmonary opacity. No pneumothorax. HEART AND MEDIASTINUM: Surgical clip in left suprahilar region. BONES  AND SOFT TISSUES: No acute osseous abnormality. IMPRESSION: 1. Emphysema and hyperinflation with flattened diaphragms, compatible with COPD. 2. Blunting of the right costophrenic angle, which may reflect  pleural parenchymal scarring versus small right effusion. . Electronically signed by: Waddell Calk MD 02/05/2024 07:14 AM EST RP Workstation: [id number]   Assessment/Plan Donna Watkins is a 64 y.o. 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  Acute on chronic hypoxic and hypercapnic respiratory failure End-stage COPD Acute metabolic encephalopathy Most likely due to removing her oxygen, resulting in acute on chronic hypoxia as well as hypercapnia.  Chest x-ray without worsening effusion or evidence of pneumonia, no fevers, no leukocytosis.  She has some hypernatremia but not significantly different from previous. -Inpatient admission -Monitor closely on stepdown unit -Patient was given a dose of p.o. Xanax , if she remains calm will place on BiPAP, this has helped her significantly before -Will have low threshold for involving pulmonology, though I note that the patient is now DNR/DNI -Continue prednisone  40 mg p.o. daily for now  Anxiety, benzodiazepine dependence -Continue Xanax  0.5 mg p.o. 3 times daily as needed, with plan for prolonged taper  Hypernatremia-this has been a chronic problem for the patient, currently her sodium level is not significantly different from baseline and I do not suspect this is causing her encephalopathy -Hydrate gently  with normal saline -Recheck sodium in the morning  Moderate protein calorie malnutrition-Ensure  Seizure-like activity-during her last admission, she had unremarkable MRI of the brain as well as prolonged EEG.  Neurology felt this was likely myoclonic jerking from her hypoxia.  No myoclonic jerking or loss of consciousness seen thus far this hospitalization.  Sacral pressure injury-stage I, present on admission -Sacral foam -Every 2 hours turns  Thrombocytopenia-resolved  DVT prophylaxis: Lovenox      Code Status: Limited: Do not attempt resuscitation (DNR) -DNR-LIMITED -Do Not Intubate/DNI   Consults called: None  Admission status: The appropriate patient status for this patient is INPATIENT. Inpatient status is judged to be reasonable and necessary in order to provide the required intensity of service to ensure the patient's safety. The patient's presenting symptoms, physical exam findings, and initial radiographic and laboratory data in the context of their chronic comorbidities is felt to place them at high risk for further clinical deterioration. Furthermore, it is not anticipated that the patient will be medically stable for discharge from the hospital within 2 midnights of admission.    I certify that at the point of admission it is my clinical judgment that the patient will require inpatient hospital care spanning beyond 2 midnights from the point of admission due to high intensity of service, high risk for further deterioration and high frequency of surveillance required  Time spent: 65 minutes  Tanikka Bresnan CHRISTELLA Gail MD [facility] Pager [phone]  If 7PM-7AM, please contact night-coverage [email or web address] Password [email or web address]  02/05/2024, 10:16 AM      [1] No Known Allergies  "

## 2024-02-05 NOTE — Progress Notes (Signed)
 PT was placed back on BIPAP after mental status became slightly altered. PT began removing equipment and is very fidgety. She was agreeable to wearing BIPAP

## 2024-02-05 NOTE — ED Provider Notes (Signed)
 " [place] [facility] AT [facility] Provider Note   CSN: [id number] Arrival date & time: 02/05/24  9570     Patient presents with: Shortness of Breath   Donna Watkins is a 64 y.o. female with history of chronic respiratory failure, on 4 L nasal cannula, squamous cell carcinoma of the lung presents to the ER from rehab for hypoxia.  Patient was found at the nursing home to be hypoxic and had taken off her oxygen.  They brought her to the Emergency Department to be reevaluated.  Patient currently has no complaints.  She is only oriented to person and not place, time, or situation.  Unfortunately, I have called [facility] 3 times without any success on getting a hold of anybody.  History is limited because of this, level 5 caveat.  Shortness of Breath      Prior to Admission medications  Medication Sig Start Date End Date Taking? Authorizing Provider  albuterol  (VENTOLIN  HFA) 108 (90 Base) MCG/ACT inhaler Inhale 2 puffs into the lungs every 4 (four) hours as needed for wheezing or shortness of breath. 11/27/21   Johnson, Clanford L, MD  alendronate (FOSAMAX) 70 MG tablet Take 70 mg by mouth once a week. 09/10/22   [provider]  ALPRAZolam  (XANAX ) 0.5 MG tablet Take 1 tablet (0.5 mg total) by mouth 3 (three) times daily as needed for anxiety. 02/03/24   Fairy Frames, MD  atorvastatin  (LIPITOR) 20 MG tablet Take 1 tablet (20 mg total) by mouth daily. Restart on 04/03/22 04/03/22   Evonnie Lenis, MD  Fluticasone -Umeclidin-Vilant (TRELEGY ELLIPTA ) 200-62.5-25 MCG/ACT AEPB Inhale 1 puff into the lungs 2 (two) times daily. 02/03/24   Fairy Frames, MD  folic acid  (FOLVITE ) 1 MG tablet Take 1 tablet (1 mg total) by mouth daily. 02/03/24   Fairy Frames, MD  ipratropium-albuterol  (DUONEB) 0.5-2.5 (3) MG/3ML SOLN Take 3 mLs by nebulization 3 (three) times daily. And as needed for SOB 11/27/21   Johnson, Clanford L, MD  mirtazapine  (REMERON ) 15 MG tablet Take 1  tablet (15 mg total) by mouth daily. 02/03/24   Fairy Frames, MD  Multiple Vitamins-Minerals (CENTRUM SILVER PO) Take 1 tablet by mouth daily.    [provider]  mupirocin ointment (BACTROBAN) 2 % Apply 1 Application topically. Patient not taking: Reported on 01/28/2024 07/01/22   [provider]  omeprazole (PRILOSEC) 20 MG capsule Take 20 mg by mouth daily.    [provider]  OXYGEN Place 3 L into the nose continuous.    [provider]  predniSONE  (DELTASONE ) 20 MG tablet Take 1-2 tablets (20-40 mg total) by mouth daily with breakfast. Take 40 mg for 2 days then 20 mg for 3 days then 10 mg for 3 days then stop 02/03/24   Joseph, Preetha, MD  senna (SENOKOT) 8.6 MG TABS tablet Take 1 tablet (8.6 mg total) by mouth 2 (two) times daily. 02/03/24   Fairy Frames, MD    Allergies: Patient has no known allergies.    Review of Systems  Unable to perform ROS: Mental status change    Updated Vital Signs BP (!) 105/93   Pulse 88   Temp 98.5 F (36.9 C) (Oral)   Resp 20   LMP 05/06/2010   SpO2 99%   Physical Exam Vitals and nursing note reviewed.  Constitutional:      Appearance: She is not toxic-appearing.     Comments: Cachectic, chronically ill-appearing but no acute distress  Cardiovascular:  Rate and Rhythm: Normal rate.  Pulmonary:     Effort: Pulmonary effort is normal.     Breath sounds: Decreased breath sounds present.     Comments: Diminished breath sounds throughout.  She is able to speak in full sentences and does not appear in any acute distress.  No tachypnea.  On 4 L nasal cannula. Skin:    General: Skin is warm and dry.  Neurological:     Mental Status: She is alert.     Comments: Patient is oriented to person only but not place or time or situation.     (all labs ordered are listed, but only abnormal results are displayed) Labs Reviewed  BASIC METABOLIC PANEL WITH GFR - Abnormal; Notable for the following components:       Result Value   Sodium 146 (*)    Chloride 95 (*)    CO2 >45 (*)    Glucose, Bld 127 (*)    BUN 29 (*)    All other components within normal limits  CBC - Abnormal; Notable for the following components:   MCV 102.9 (*)    MCHC 29.5 (*)    All other components within normal limits  BASIC METABOLIC PANEL WITH GFR - Abnormal; Notable for the following components:   Sodium 147 (*)    Chloride 94 (*)    CO2 >45 (*)    Glucose, Bld 100 (*)    BUN 30 (*)    All other components within normal limits  BLOOD GAS, ARTERIAL - Abnormal; Notable for the following components:   pCO2 arterial 80 (*)    Bicarbonate 53.1 (*)    Acid-Base Excess 24.2 (*)    All other components within normal limits    EKG: None  Radiology: DG Chest 2 View Result Date: 02/05/2024 EXAM: 2 VIEW(S) XRAY OF THE CHEST 02/05/2024 05:06:00 AM COMPARISON: 02/01/2024 CLINICAL HISTORY: Shortness of breath. FINDINGS: LUNGS AND PLEURA: Emphysema. Hyperinflation with flattened diaphragms, compatible with COPD. Blunting of right costophrenic angle. No focal pulmonary opacity. No pneumothorax. HEART AND MEDIASTINUM: Surgical clip in left suprahilar region. BONES AND SOFT TISSUES: No acute osseous abnormality. IMPRESSION: 1. Emphysema and hyperinflation with flattened diaphragms, compatible with COPD. 2. Blunting of the right costophrenic angle, which may reflect pleural parenchymal scarring versus small right effusion. . Electronically signed by: Waddell Calk MD 02/05/2024 07:14 AM EST RP Workstation: [id number]   .Critical Care  Performed by: Bernis Ernst, PA-C Authorized by: Bernis Ernst, PA-C   Critical care provider statement:    Critical care time (minutes):  30   Critical care was necessary to treat or prevent imminent or life-threatening deterioration of the following conditions:  Respiratory failure   Critical care was time spent personally by me on the following activities:  Development of treatment plan with patient  or surrogate, discussions with consultants, evaluation of patient's response to treatment, examination of patient, ordering and review of laboratory studies, ordering and review of radiographic studies, ordering and performing treatments and interventions, pulse oximetry, re-evaluation of patient's condition and review of old charts   Care discussed with: admitting provider   Comments:     Hypercapnia, altered mental status, requiring BiPAP and admission    Medications Ordered in the ED  ALPRAZolam  (XANAX ) tablet 0.5 mg (has no administration in time range)    Clinical Course as of 02/05/24 0913  Sat Feb 05, 2024  0858 I have called [facility] x2. Will ring until the call is disconnected.  [RR]  [phone]  Personally evaluated patient, patient oriented only to self, is tachycardic to the low 100s on exam, does have significant hypercarbia as well as hyponatremia.  Chart review reveals that patient was listed as A&O x 3 during prior hospitalization, currently oriented only x 1, last patient is appropriate for admission for altered mental status, possibly related to underlying acute on chronic hypercarbia, the patient is notably not acidotic so a component of this is certainly chronic. [LS]  0909 Called RT, Paula, about BiPAP. The patient was recently restarted on a low dose of Xanax  for her long term use of high dose Xanax . Dose ordered here in the ER.  [RR]    Clinical Course User Index [LS] Rogelia Jerilynn RAMAN, MD [RR] Bernis Ernst, PA-C   Medical Decision Making Amount and/or Complexity of Data Reviewed Labs: ordered. Radiology: ordered.  Risk Prescription drug management. Decision regarding hospitalization.   64 y.o. female presents to the ER for evaluation of hypoxia. Differential diagnosis includes but is not limited to COPD, viral illness, compliancy. Vital signs unremarkable, on her at home 4L [place] satting well without any increase work of breathing. Physical exam as noted above.    On previous chart evaluation, multiple charts mention the patient is ANO x 3.  Today, she is only oriented to person only, not place or time or reason for being here.  She speaks in full sentences and does not appear in any acute respiratory distress.  I have ordered an ABG and have called the dayshift as they were having trouble with night shift to obtain this.  ABG is currently pending.  Will order basic lab work as well.  I independently reviewed and interpreted the patient's labs.  ABG shows pH of 7.43.  Patient's pCO2 is 80.  It appears that a few days ago it was 92 and before that it was 86.  CBC without leukocytosis or anemia.  BMP shows a sodium 147, chloride 94.  CO2 greater than 45.  Glucose of 100 with a BUN of 30.  No other electrolyte abnormality.  Chest x-ray shows 1. Emphysema and hyperinflation with flattened diaphragms, compatible with COPD. 2. Blunting of the right costophrenic angle, which may reflect pleural parenchymal scarring versus small right effusion. Per radiologist's interpretation.    Given previous charts mention patient is ANO x 3 and patient only oriented to person only, I have placed the patient on BiPAP to help with her mental status.  It appears that she is recently been weaned off of high-dose benzodiazepines was restarted at a dose of Xanax  0.5 mg twice daily as needed.  She is starting to have some antsiness and is getting up out of the bed.  I have ordered her dose.  She is satting well on her at home oxygen and again does not appear in any acute respiratory distress.  But given her altered mental status will need to be admitted.  I discussed this case with my attending physician who cosigned this note including patient's presenting symptoms, physical exam, and planned diagnostics and interventions. Attending physician stated agreement with plan or made changes to plan which were implemented.   Attending physician assessed patient at bedside.  Portions of this  report may have been transcribed using voice recognition software. Every effort was made to ensure accuracy; however, inadvertent computerized transcription errors may be present.    Final diagnoses:  Altered mental status, unspecified altered mental status type  Hypoxia    ED Discharge Orders     None  Bernis Ernst, PA-C 02/05/24 1018  "

## 2024-02-05 NOTE — ED Notes (Signed)
 After receiving report, nurse went to check on pt. Pt found with dried blood on her arms, hands, abdomen, and face where she had pulled out her IV. Pt also found in brief that had been urinated in multiple times and the brief had began to fall apart. Nurse and medic gave pt a bed bath and replaced linens.

## 2024-02-05 NOTE — ED Triage Notes (Signed)
 Pt comes via [facility] from [facility],  pt was initially hypoxic because she had took her 4L of oxygen off, staff stated that they  can not watch her and make her wear it pt oxygen increased from 90% to 99% when placed back on her oxygen, facility wanted her sent for evaluation.

## 2024-02-05 NOTE — Progress Notes (Signed)
 RT responded to notification of bipap order. RT approached room with the bipap, pt was sitting up sideways in the bed, gown off shoulders, notified RN & PA, pt is not appropriate for BIPAP at this time. Once pt is medicated, notify RT to try bipap placement.

## 2024-02-05 NOTE — Progress Notes (Signed)
" °   02/05/24 1037  BiPAP/CPAP/SIPAP  $ Non-Invasive Ventilator  Non-Invasive Vent Initial  $ Face Mask Small Yes  BiPAP/CPAP/SIPAP Pt Type Adult  BiPAP/CPAP/SIPAP (S)  SERVO (Air: PC)  Mask Type Full face mask  Dentures removed? Not applicable  Mask Size (S)  Small  Set Rate (S)  18 breaths/min  Respiratory Rate 29 breaths/min  IPAP (S)  8 cmH20  EPAP (S)  5 cmH2O  Pressure Support 3 cmH20  PEEP 5 cmH20  FiO2 (%) (S)  35 %  Minute Ventilation 7.1  Leak 45  Peak Inspiratory Pressure (PIP) 8  Tidal Volume (Vt) 590  Patient Home Machine No  Patient Home Mask No  Patient Home Tubing No  Auto Titrate No  Press High Alarm 25 cmH2O  Nasal massage performed No (comment)  CPAP/SIPAP surface wiped down Yes  Device Plugged into RED Power Outlet Yes  Oxygen Percent 35 %  BiPAP/CPAP /SiPAP Vitals  Pulse Rate 93  Resp (!) 23  SpO2 100 %  Bilateral Breath Sounds Diminished  MEWS Score/Color  MEWS Score 1  MEWS Score Color Green    "

## 2024-02-05 NOTE — Progress Notes (Signed)
" °   02/05/24 1155  BiPAP/CPAP/SIPAP  BiPAP/CPAP/SIPAP (S)  SERVO (Air: PC. Transported pt from ER 3 to ICU 1238, full E cylinder, Sp02 100%, safe transport.)    "

## 2024-02-06 ENCOUNTER — Other Ambulatory Visit: Payer: Self-pay

## 2024-02-06 LAB — BASIC METABOLIC PANEL WITH GFR
BUN: 29 mg/dL — ABNORMAL HIGH (ref 8–23)
CO2: >45 mmol/L — ABNORMAL HIGH (ref 22–32)
Calcium: 9.1 mg/dL (ref 8.9–10.3)
Chloride: 95 mmol/L — ABNORMAL LOW (ref 98–111)
Creatinine, Ser: 0.43 mg/dL — ABNORMAL LOW (ref 0.44–1.00)
GFR, Estimated: >60 mL/min
Glucose, Bld: 98 mg/dL (ref 70–99)
Potassium: 5 mmol/L (ref 3.5–5.1)
Sodium: 145 mmol/L (ref 135–145)

## 2024-02-06 LAB — CBC
HCT: 36.7 % (ref 36.0–46.0)
Hemoglobin: 11 g/dL — ABNORMAL LOW (ref 12.0–15.0)
MCH: 30.8 pg (ref 26.0–34.0)
MCHC: 30 g/dL (ref 30.0–36.0)
MCV: 102.8 fL — ABNORMAL HIGH (ref 80.0–100.0)
Platelets: 173 10*3/uL (ref 150–400)
RBC: 3.57 MIL/uL — ABNORMAL LOW (ref 3.87–5.11)
RDW: 12 % (ref 11.5–15.5)
WBC: 4.9 10*3/uL (ref 4.0–10.5)
nRBC: 0 % (ref 0.0–0.2)

## 2024-02-06 MED ORDER — BUDESONIDE 0.5 MG/2ML IN SUSP
0.5000 mg | Freq: Two times a day (BID) | RESPIRATORY_TRACT | Status: DC
  Administered 2024-02-06 – 2024-02-09 (×6): 0.5 mg via RESPIRATORY_TRACT
  Filled 2024-02-06 (×6): qty 2

## 2024-02-06 MED ORDER — REVEFENACIN 175 MCG/3ML IN SOLN
175.0000 ug | Freq: Every day | RESPIRATORY_TRACT | Status: DC
  Administered 2024-02-06 – 2024-02-09 (×4): 175 ug via RESPIRATORY_TRACT
  Filled 2024-02-06 (×4): qty 3

## 2024-02-06 MED ORDER — ARFORMOTEROL TARTRATE 15 MCG/2ML IN NEBU
15.0000 ug | INHALATION_SOLUTION | Freq: Two times a day (BID) | RESPIRATORY_TRACT | Status: DC
  Administered 2024-02-06 – 2024-02-09 (×6): 15 ug via RESPIRATORY_TRACT
  Filled 2024-02-06 (×6): qty 2

## 2024-02-06 NOTE — Plan of Care (Signed)
   Problem: Safety: Goal: Ability to remain free from injury will improve Outcome: Progressing   Problem: Skin Integrity: Goal: Risk for impaired skin integrity will decrease Outcome: Progressing

## 2024-02-06 NOTE — Progress Notes (Signed)
 " PROGRESS NOTE    SYRIAH Watkins  FMW:[id number] DOB: 01-Jan-1961 DOA: 02/05/2024 PCP: Shona Norleen PEDLAR, MD   Brief Narrative:  This 64 y.o. 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 on 02/04/24 after a prolonged stay during which she was intubated due to acute on chronic hypoxic and hypercapnic respiratory failure, COPD exacerbation and treated for suspected pneumonia.  She returned back from her facility with complaints of confusion, removing her oxygen which caused desaturation.  Nursing home staff reports that they were unable to manage her due to her confusion and not keeping her oxygen. Of Note during her hospitalization, She was intubated on 1/22, extubated 1/23, on 1/25 she was unresponsive after she removed her own oxygen, and improved after she was transferred back to the ICU and treated with BiPAP. On 1/28 she met with palliative care and agreed to DNR/DNI. She was discharged back to facility yesterday 1/30 after completing 7 days of IV Rocephin , treated with bronchodilators and inhalers, discharged on prednisone  taper.  Patient was admitted for further evaluation.  Pulmonology is consulted.  Assessment & Plan:   Principal Problem:   Acute on chronic respiratory failure with hypoxia and hypercapnia (HCC)  Acute on chronic hypoxic and hypercapnic respiratory failure: End-stage COPD: Acute metabolic encephalopathy: Patient presented from nursing home with confusion and acute hypoxia. Most likely due to removing her oxygen, resulting in acute on chronic hypoxia as well as hypercapnia.   Chest x-ray without worsening effusion or evidence of pneumonia. She denies any fevers, no leukocytosis.  She has some hypernatremia but not significantly different from previous. Monitor closely in stepdown unit. Patient was placed on BiPAP which subsequently made her better. Pulmonology is  consulted,  awaiting recommendation. Continue prednisone  40 mg p.o. daily.   Anxiety, benzodiazepine dependence Continue Xanax  0.5 mg p.o. 3 times daily as needed, with plan for prolonged taper.   Hypernatremia: This has been a chronic problem for the patient, currently her sodium level is not significantly different from baseline and I do not suspect this is causing her encephalopathy Continue IV hydration with normal saline. Serum sodium improved.   Moderate protein calorie malnutrition; Ensure.   Seizure-like activity:  During her last admission, she had unremarkable MRI of the brain as well as prolonged EEG.   Neurology felt this was likely myoclonic jerking from her hypoxia.   No myoclonic jerking or loss of consciousness seen thus far this hospitalization.   Sacral pressure injury-stage I, present on admission -Sacral foam -Every 2 hours turns   Thrombocytopenia; resolved.    DVT prophylaxis: Lovenox  Code Status: Full code Family Communication: No family at bedside Disposition Plan:    Status is: Inpatient Remains inpatient appropriate because: Patient was readmitted for acute on chronic hypoxic and hypercapnic respiratory failure in the setting of advanced COPD.  Pulmonology is consulted.  Patient is now requiring BiPAP Patient is not medically ready for discharge yet    Consultants:  Pulmonology  Procedures: None  Antimicrobials:  Anti-infectives (From admission, onward)    None      Subjective: Patient was seen and examined at bedside.  Overnight events noted. She is lying comfortably,  remains on 4 L of supplemental oxygen saturating 93%. Last night she was given BiPAP and now its off for now.  Objective: Vitals:   02/06/24 0400 02/06/24 0500 02/06/24 0600 02/06/24 0800  BP: (!) 112/57   (!) 100/45  Pulse: (!) 53 63 (!) 59 62  Resp: (!) 21 19 18 20   Temp: 98.7 F (37.1 C)     TempSrc: Axillary     SpO2: 99% 94% 97% 98%  Weight:      Height:         Intake/Output Summary (Last 24 hours) at 02/06/2024 1021 Last data filed at 02/06/2024 0600 Gross per 24 hour  Intake 98.41 ml  Output 200 ml  Net -101.59 ml   Filed Weights   02/05/24 1223  Weight: 42 kg    Examination:  General exam: Appears calm and comfortable, deconditioned, not in any acute distress. Respiratory system: CTA Bilaterally. Respiratory effort normal.  RR 16 Cardiovascular system: S1 & S2 heard, RRR. No JVD, murmurs, rubs, gallops or clicks. Gastrointestinal system: Abdomen is non distended, soft and non tender. Normal bowel sounds heard. Central nervous system: Alert and oriented x 3. No focal neurological deficits. Extremities: No edema, no cyanosis, no clubbing. Skin: No rashes, lesions or ulcers Psychiatry: Judgement and insight appear normal. Mood & affect appropriate.   Data Reviewed: I have personally reviewed following labs and imaging studies  CBC: Recent Labs  Lab 01/31/24 0056 02/01/24 0250 02/02/24 0251 02/05/24 0451 02/06/24 0308  WBC 6.1 5.7 5.8 7.3 4.9  HGB 10.8* 11.2* 11.7* 12.7 11.0*  HCT 36.6 37.7 39.1 43.0 36.7  MCV 101.9* 102.2* 101.6* 102.9* 102.8*  PLT 105* 126* 143* 243 173   Basic Metabolic Panel: Recent Labs  Lab 01/31/24 0056 02/01/24 0250 02/02/24 0251 02/03/24 0235 02/05/24 0451 02/05/24 0612 02/06/24 0308  NA 144  --  146* 145 146* 147* 145  K 4.0  --  4.8 4.1 3.9 4.2 5.0  CL 95*  --  94* 91* 95* 94* 95*  CO2 43*  --  >45* >45* >45* >45* >45*  GLUCOSE 158*  --  127* 134* 127* 100* 98  BUN 23  --  13 17 29* 30* 29*  CREATININE 0.37*  --  0.40* 0.36* 0.63 0.64 0.43*  CALCIUM  8.5*  --  9.0 8.8* 10.0 9.8 9.1  MG 2.3 1.7  --   --   --   --   --   PHOS 2.3* 2.1* 3.8  --   --   --   --    GFR: Estimated Creatinine Clearance: 48.3 mL/min (A) (by C-G formula based on SCr of 0.43 mg/dL (L)). Liver Function Tests: Recent Labs  Lab 01/31/24 0056  AST 26  ALT 21  ALKPHOS 62  BILITOT 0.3  PROT 5.9*  ALBUMIN 3.6    No results for input(s): LIPASE, AMYLASE in the last 168 hours. No results for input(s): AMMONIA in the last 168 hours. Coagulation Profile: No results for input(s): INR, PROTIME in the last 168 hours. Cardiac Enzymes: No results for input(s): CKTOTAL, CKMB, CKMBINDEX, TROPONINI in the last 168 hours. BNP (last 3 results) No results for input(s): PROBNP in the last 8760 hours. HbA1C: No results for input(s): HGBA1C in the last 72 hours. CBG: Recent Labs  Lab 01/30/24 1745 01/30/24 2020 01/31/24 0830  GLUCAP 184* 128* 148*   Lipid Profile: No results for input(s): CHOL, HDL, LDLCALC, TRIG, CHOLHDL, LDLDIRECT in the last 72 hours. Thyroid  Function Tests: No results for input(s): TSH, T4TOTAL, FREET4, T3FREE, THYROIDAB in the last 72 hours. Anemia Panel: No results for input(s): VITAMINB12, FOLATE, FERRITIN, TIBC, IRON, RETICCTPCT in the last 72 hours. Sepsis Labs: No results for input(s): PROCALCITON, LATICACIDVEN in the last 168 hours.  Recent Results (from the past 240 hours)  Culture, blood (Routine X 2) w Reflex to ID Panel     Status: None   Collection Time: 01/27/24 10:52 AM   Specimen: BLOOD  Result Value Ref Range Status   Specimen Description BLOOD BLOOD LEFT HAND  Final   Special Requests   Final    BOTTLES DRAWN AEROBIC AND ANAEROBIC Blood Culture adequate volume   Culture   Final    NO GROWTH 5 DAYS Performed at [facility], [address], [place], [place] [address]    Report Status 02/01/2024 FINAL  Final  MRSA Next Gen by PCR, Nasal     Status: None   Collection Time: 01/27/24  2:40 PM   Specimen: Nasal Mucosa; Nasal Swab  Result Value Ref Range Status   MRSA by PCR Next Gen NOT DETECTED NOT DETECTED Final    Comment: (NOTE) The GeneXpert MRSA Assay (FDA approved for NASAL specimens only), is one component of a comprehensive MRSA colonization surveillance program. It is not intended to  diagnose MRSA infection nor to guide or monitor treatment for MRSA infections. Test performance is not FDA approved in patients less than 77 years old. Performed at [facility], [address] [place], [place] [address]   Respiratory (~20 pathogens) panel by PCR     Status: None   Collection Time: 01/27/24  3:38 PM   Specimen: Nasopharyngeal Swab; Respiratory  Result Value Ref Range Status   Adenovirus NOT DETECTED NOT DETECTED Final   Coronavirus 229E NOT DETECTED NOT DETECTED Final    Comment: (NOTE) The Coronavirus on the Respiratory Panel, DOES NOT test for the novel  Coronavirus (2019 nCoV)    Coronavirus HKU1 NOT DETECTED NOT DETECTED Final   Coronavirus NL63 NOT DETECTED NOT DETECTED Final   Coronavirus OC43 NOT DETECTED NOT DETECTED Final   Metapneumovirus NOT DETECTED NOT DETECTED Final   Rhinovirus / Enterovirus NOT DETECTED NOT DETECTED Final   Influenza A NOT DETECTED NOT DETECTED Final   Influenza B NOT DETECTED NOT DETECTED Final   Parainfluenza Virus 1 NOT DETECTED NOT DETECTED Final   Parainfluenza Virus 2 NOT DETECTED NOT DETECTED Final   Parainfluenza Virus 3 NOT DETECTED NOT DETECTED Final   Parainfluenza Virus 4 NOT DETECTED NOT DETECTED Final   Respiratory Syncytial Virus NOT DETECTED NOT DETECTED Final   Bordetella pertussis NOT DETECTED NOT DETECTED Final   Bordetella Parapertussis NOT DETECTED NOT DETECTED Final   Chlamydophila pneumoniae NOT DETECTED NOT DETECTED Final   Mycoplasma pneumoniae NOT DETECTED NOT DETECTED Final    Comment: Performed at [facility], [address], [place], [place] [address]  Resp panel by RT-PCR (RSV, Flu A&B, Covid) Anterior Nasal Swab     Status: None   Collection Time: 01/27/24  3:38 PM   Specimen: Anterior Nasal Swab  Result Value Ref Range Status   SARS Coronavirus 2 by RT PCR NEGATIVE NEGATIVE Final   Influenza A by PCR NEGATIVE NEGATIVE Final   Influenza B by PCR NEGATIVE NEGATIVE Final    Comment:  (NOTE) The Xpert Xpress SARS-CoV-2/FLU/RSV plus assay is intended as an aid in the diagnosis of influenza from Nasopharyngeal swab specimens and should not be used as a sole basis for treatment. Nasal washings and aspirates are unacceptable for Xpert Xpress SARS-CoV-2/FLU/RSV testing.  Fact Sheet for Patients: bloggercourse.com  Fact Sheet for Healthcare Providers: seriousbroker.it  This test is not yet approved or cleared by the [place]  FDA and  has been authorized for detection and/or diagnosis of SARS-CoV-2 by FDA under an Emergency Use Authorization (EUA). This EUA will remain in effect (meaning this test can be used) for the duration of the COVID-19 declaration under Section 564(b)(1) of the Act, 21 U.S.C. section 360bbb-3(b)(1), unless the authorization is terminated or revoked.     Resp Syncytial Virus by PCR NEGATIVE NEGATIVE Final    Comment: (NOTE) Fact Sheet for Patients: bloggercourse.com  Fact Sheet for Healthcare Providers: seriousbroker.it  This test is not yet approved or cleared by the [place]  FDA and has been authorized for detection and/or diagnosis of SARS-CoV-2 by FDA under an Emergency Use Authorization (EUA). This EUA will remain in effect (meaning this test can be used) for the duration of the COVID-19 declaration under Section 564(b)(1) of the Act, 21 U.S.C. section 360bbb-3(b)(1), unless the authorization is terminated or revoked.  Performed at [facility], [address], [place], [place] [address]   Culture, Respiratory w Gram Stain     Status: None   Collection Time: 01/27/24  3:38 PM   Specimen: Tracheal Aspirate; Respiratory  Result Value Ref Range Status   Specimen Description TRACHEAL ASPIRATE  Final   Special Requests NONE  Final   Gram Stain   Final    FEW WBC PRESENT, PREDOMINANTLY PMN FEW GRAM POSITIVE COCCI IN PAIRS  IN CHAINS    Culture   Final    ABUNDANT Consistent with normal respiratory flora. Performed at [facility], [address] [place], [place] [address]    Report Status 01/31/2024 FINAL  Final  MRSA Next Gen by PCR, Nasal     Status: None   Collection Time: 02/05/24 12:26 PM   Specimen: Urine, Clean Catch; Nasal Swab  Result Value Ref Range Status   MRSA by PCR Next Gen NOT DETECTED NOT DETECTED Final    Comment: (NOTE) The GeneXpert MRSA Assay (FDA approved for NASAL specimens only), is one component of a comprehensive MRSA colonization surveillance program. It is not intended to diagnose MRSA infection nor to guide or monitor treatment for MRSA infections. Test performance is not FDA approved in patients less than 26 years old. Performed at [facility], [address], [place], [place] [address]    Radiology Studies: DG Chest 2 View Result Date: 02/05/2024 EXAM: 2 VIEW(S) XRAY OF THE CHEST 02/05/2024 05:06:00 AM COMPARISON: 02/01/2024 CLINICAL HISTORY: Shortness of breath. FINDINGS: LUNGS AND PLEURA: Emphysema. Hyperinflation with flattened diaphragms, compatible with COPD. Blunting of right costophrenic angle. No focal pulmonary opacity. No pneumothorax. HEART AND MEDIASTINUM: Surgical clip in left suprahilar region. BONES AND SOFT TISSUES: No acute osseous abnormality. IMPRESSION: 1. Emphysema and hyperinflation with flattened diaphragms, compatible with COPD. 2. Blunting of the right costophrenic angle, which may reflect pleural parenchymal scarring versus small right effusion. . Electronically signed by: Taylor Stroud MD 02/05/2024 07:14 AM EST RP Workstation: [id number]   Scheduled Meds:  atorvastatin   20 mg Oral Daily   budesonide -glycopyrrolate -formoterol   2 puff Inhalation BID   Chlorhexidine  Gluconate Cloth  6 each Topical Daily   enoxaparin  (LOVENOX ) injection  30 mg Subcutaneous Daily   folic acid   1 mg Oral Daily   pantoprazole   40 mg Oral  Daily   predniSONE   40 mg Oral Q breakfast   senna  1 tablet Oral BID   Continuous Infusions:   LOS: 1 day    Time spent: 50 mins    Darcel Dawley, MD [facility]   If  7PM-7AM, please contact night-coverage  "

## 2024-02-07 LAB — MISC LABCORP TEST (SEND OUT)
LabCorp test name: 83935
Labcorp test code: 83935

## 2024-02-07 NOTE — Evaluation (Signed)
 Occupational Therapy Evaluation Patient Details Name: Donna Watkins MRN: [id number] DOB: [birth year] Today's Date: 02/07/2024   History of Present Illness   64 y.o. 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  She returned back from her facility with complaints of confusion, removing her oxygen which caused desaturation.  Nursing home staff reports that they were unable to manage her due to her confusion and not keeping her oxygen.     Clinical Impressions Pt admitted with the above concerns. Pt currently with functional limitations due to the deficits listed below (see OT Problem List). Pt recently discharged from [facility] to SNF for short term rehab. Plan is to return to SNF at discharge. Pt will benefit from acute skilled OT to increase their safety and independence with ADL and functional mobility for ADL to facilitate discharge. Patient will benefit from continued inpatient follow up therapy, <3 hours/day.       If plan is discharge home, recommend the following:   A little help with walking and/or transfers;A little help with bathing/dressing/bathroom;Help with stairs or ramp for entrance     Functional Status Assessment   Patient has had a recent decline in their functional status and demonstrates the ability to make significant improvements in function in a reasonable and predictable amount of time.     Equipment Recommendations   Other (comment) (defer to next venue of care)      Precautions/Restrictions   Precautions Precautions: Fall Recall of Precautions/Restrictions: Impaired Restrictions Weight Bearing Restrictions Per Provider Order: No     Mobility Bed Mobility Overal  bed mobility: Modified Independent             General bed mobility comments: No assist for bed mobility. Pt was able to transition to left side of bed to transfer to [facility]. After toileting, pt got into bed on left side and pivoted to the right side to transfer to recliner.    Transfers Overall transfer level: Needs assistance Equipment used: None Transfers: Sit to/from Stand, Bed to chair/wheelchair/BSC Sit to Stand: Contact guard assist     Step pivot transfers: Contact guard assist     General transfer comment: Slight imbalance noted although RW not required to complete step pivot transfer.      Balance Overall balance assessment: Mild deficits observed, not formally tested         ADL either performed or assessed with clinical judgement   ADL       Grooming: Wash/dry face;Brushing hair;Minimal assistance;Sitting   Upper Body Bathing: Set up;Sitting   Lower Body Bathing: Contact guard assist;Sitting/lateral leans   Upper Body Dressing : Set up;Sitting   Lower Body Dressing: Contact guard assist;Sitting/lateral leans   Toilet Transfer: Contact guard assist;BSC/3in1;Stand-pivot   Toileting- Clothing Manipulation and Hygiene: Set up;Sitting/lateral lean        Vision Baseline Vision/History: 1 Wears glasses Ability to See in Adequate Light: 0 Adequate Patient Visual Report: No change from baseline Vision Assessment?: No apparent visual deficits     Perception Perception: Within Functional Limits       Praxis Praxis: WFL       Pertinent Vitals/Pain Pain Assessment Pain Assessment: No/denies pain     Extremity/Trunk Assessment Upper Extremity Assessment Upper Extremity Assessment: Generalized  weakness;Right hand dominant   Lower Extremity Assessment Lower Extremity Assessment: Generalized weakness   Cervical / Trunk Assessment Cervical / Trunk Assessment: Kyphotic   Communication Communication Communication: No apparent difficulties    Cognition Arousal: Alert Behavior During Therapy: WFL for tasks assessed/performed (slightly impulsive) Cognition: No apparent impairments    Following commands: Intact       Cueing  General Comments   Cueing Techniques: Verbal cues  3L O2 via [place] on during session.   Exercises     Shoulder Instructions      Home Living Family/patient expects to be discharged to:: Skilled nursing facility Living Arrangements: Other relatives (sister) Available Help at Discharge: Family;Available 24 hours/day Type of Home: House Home Access: Stairs to enter   Entrance Stairs-Rails: Right;Left;Can reach both Home Layout: One level     Bathroom Shower/Tub: Producer, Television/film/video: Standard     Home Equipment: Grab bars - tub/shower;Shower Counsellor (2 wheels);Wheelchair - manual              OT Problem List: Decreased strength;Impaired balance (sitting and/or standing);Decreased safety awareness;Decreased activity tolerance   OT Treatment/Interventions: Self-care/ADL training;Therapeutic exercise;Therapeutic activities;DME and/or AE instruction;Patient/family education;Balance training      OT Goals(Current goals can be found in the care plan section)   Acute Rehab OT Goals Patient Stated Goal: to use the bathroom OT Goal Formulation: With patient Time For Goal Achievement: 02/21/24 Potential to Achieve Goals: Good   OT Frequency:  Min 2X/week       AM-PAC OT 6 Clicks Daily Activity     Outcome Measure Help from another person eating meals?: None Help from another person taking care of personal grooming?: None Help from another person toileting, which includes using toliet, bedpan, or urinal?: A Little Help from another person bathing (including washing, rinsing, drying)?: A Little Help from another person to put on and taking off regular upper body clothing?: A Little Help from another person to put on and taking off regular lower body clothing?:  A Little 6 Click Score: 20   End of Session Equipment Utilized During Treatment: Oxygen  Activity Tolerance: Patient tolerated treatment well Patient left: in chair;with call bell/phone within reach;with chair alarm set  OT Visit Diagnosis: Muscle weakness (generalized) (M62.81)                Time: 8953-8891 OT Time Calculation (min): 22 min Charges:  OT General Charges $OT Visit: 1 Visit OT Evaluation $OT Eval Low Complexity: 1 Low  Leita Howell, OTR/L,CBIS  Supplemental OT - [facility] and [employer] Secure Chat Preferred    Jasen Hartstein, Leita BIRCH 02/07/2024, 11:18 AM

## 2024-02-07 NOTE — Plan of Care (Signed)

## 2024-02-07 NOTE — Progress Notes (Signed)
 " PROGRESS NOTE    Donna Watkins  FMW:[id number] DOB: 08-Jan-1960 DOA: 02/05/2024 PCP: Shona Norleen PEDLAR, MD   Brief Narrative:  This 64 y.o. 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 on 02/04/24 after a prolonged stay during which she was intubated due to acute on chronic hypoxic and hypercapnic respiratory failure, COPD exacerbation and treated for suspected pneumonia.  She returned back from her facility with complaints of confusion, removing her oxygen which caused desaturation.  Nursing home staff reports that they were unable to manage her due to her confusion and not keeping her oxygen. Of Note during her hospitalization, She was intubated on 1/22, extubated 1/23, on 1/25 she was unresponsive after she removed her own oxygen, and improved after she was transferred back to the ICU and treated with BiPAP. On 1/28 she met with palliative care and agreed to DNR/DNI. She was discharged back to facility yesterday 1/30 after completing 7 days of IV Rocephin , treated with bronchodilators and inhalers, discharged on prednisone  taper.  Patient was admitted for further evaluation.  Pulmonology is consulted.  Assessment & Plan:   Principal Problem:   Acute on chronic respiratory failure with hypoxia and hypercapnia (HCC)  Acute on chronic hypoxic and hypercapnic respiratory failure: End-stage COPD: Acute metabolic encephalopathy: Patient presented from nursing home with confusion and acute hypoxia. Most likely due to removing her oxygen, resulting in acute on chronic hypoxia as well as hypercapnia.   Chest x-ray without worsening effusion or evidence of pneumonia. She denies any fevers, no leukocytosis.   She has some hypernatremia but not significantly different from previous. Monitor closely in progressive unit. Patient was placed on BiPAP which subsequently made her better. Continue BiPAP as  needed and at night. Pulmonology is consulted,  awaiting recommendation. Continue prednisone  40 mg p.o. daily.   Anxiety, benzodiazepine dependence: Continue Xanax  0.5 mg p.o. 3 times daily as needed, with plan for prolonged taper.   Hypernatremia: This has been a chronic problem for the patient, currently her sodium level is not significantly different from baseline and I do not suspect this is causing her encephalopathy. Continue IV hydration with normal saline. Serum sodium improved.   Moderate protein calorie malnutrition; Ensure.   Seizure-like activity:  During her last admission, she had unremarkable MRI of the brain as well as prolonged EEG.   Neurology felt this was likely myoclonic jerking from her hypoxia.   No myoclonic jerking or loss of consciousness seen thus far this hospitalization.   Sacral pressure injury-stage I, present on admission -Sacral foam -Every 2 hours turns   Thrombocytopenia; resolved.    DVT prophylaxis: Lovenox  Code Status: Full code Family Communication: No family at bedside Disposition Plan:    Status is: Inpatient Remains inpatient appropriate because: Patient was readmitted for acute on chronic hypoxic and hypercapnic respiratory failure in the setting of advanced COPD.  Pulmonology is consulted.  Patient is now requiring BiPAP Patient is not medically ready for discharge yet    Consultants:  Pulmonology  Procedures: None  Antimicrobials:  Anti-infectives (From admission, onward)    None      Subjective: Patient was seen and examined at bedside.  Overnight events noted. She is lying comfortably in bed ,  remains on 4 L of supplemental oxygen saturating 93%. Last night she was given BiPAP and now its off for now.  Objective: Vitals:   02/06/24 2200 02/07/24 0318 02/07/24  0801 02/07/24 0806  BP:  126/70    Pulse:  (!) 57    Resp: 20 20  16   Temp:  98.6 F (37 C)    TempSrc:  Oral    SpO2:  97% 98%   Weight:       Height:        Intake/Output Summary (Last 24 hours) at 02/07/2024 1049 Last data filed at 02/07/2024 0930 Gross per 24 hour  Intake 480 ml  Output 410 ml  Net 70 ml   Filed Weights   02/05/24 1223  Weight: 42 kg    Examination:  General exam: Appears calm and comfortable, deconditioned, not in any acute distress. Respiratory system: CTA Bilaterally. Respiratory effort normal.  RR 15 Cardiovascular system: S1 & S2 heard, RRR. No JVD, murmurs, rubs, gallops or clicks. Gastrointestinal system: Abdomen is non distended, soft and non tender. Normal bowel sounds heard. Central nervous system: Alert and oriented x 3. No focal neurological deficits. Extremities: No edema, no cyanosis, no clubbing. Skin: No rashes, lesions or ulcers Psychiatry: Judgement and insight appear normal. Mood & affect appropriate.   Data Reviewed: I have personally reviewed following labs and imaging studies  CBC: Recent Labs  Lab 02/01/24 0250 02/02/24 0251 02/05/24 0451 02/06/24 0308  WBC 5.7 5.8 7.3 4.9  HGB 11.2* 11.7* 12.7 11.0*  HCT 37.7 39.1 43.0 36.7  MCV 102.2* 101.6* 102.9* 102.8*  PLT 126* 143* 243 173   Basic Metabolic Panel: Recent Labs  Lab 02/01/24 0250 02/02/24 0251 02/03/24 0235 02/05/24 0451 02/05/24 0612 02/06/24 0308  NA  --  146* 145 146* 147* 145  K  --  4.8 4.1 3.9 4.2 5.0  CL  --  94* 91* 95* 94* 95*  CO2  --  >45* >45* >45* >45* >45*  GLUCOSE  --  127* 134* 127* 100* 98  BUN  --  13 17 29* 30* 29*  CREATININE  --  0.40* 0.36* 0.63 0.64 0.43*  CALCIUM   --  9.0 8.8* 10.0 9.8 9.1  MG 1.7  --   --   --   --   --   PHOS 2.1* 3.8  --   --   --   --    GFR: Estimated Creatinine Clearance: 48.3 mL/min (A) (by C-G formula based on SCr of 0.43 mg/dL (L)). Liver Function Tests: No results for input(s): AST, ALT, ALKPHOS, BILITOT, PROT, ALBUMIN in the last 168 hours.  No results for input(s): LIPASE, AMYLASE in the last 168 hours. No results for input(s):  AMMONIA in the last 168 hours. Coagulation Profile: No results for input(s): INR, PROTIME in the last 168 hours. Cardiac Enzymes: No results for input(s): CKTOTAL, CKMB, CKMBINDEX, TROPONINI in the last 168 hours. BNP (last 3 results) No results for input(s): PROBNP in the last 8760 hours. HbA1C: No results for input(s): HGBA1C in the last 72 hours. CBG: No results for input(s): GLUCAP in the last 168 hours.  Lipid Profile: No results for input(s): CHOL, HDL, LDLCALC, TRIG, CHOLHDL, LDLDIRECT in the last 72 hours. Thyroid  Function Tests: No results for input(s): TSH, T4TOTAL, FREET4, T3FREE, THYROIDAB in the last 72 hours. Anemia Panel: No results for input(s): VITAMINB12, FOLATE, FERRITIN, TIBC, IRON, RETICCTPCT in the last 72 hours. Sepsis Labs: No results for input(s): PROCALCITON, LATICACIDVEN in the last 168 hours.  Recent Results (from the past 240 hours)  MRSA Next Gen by PCR, Nasal     Status: None   Collection Time: 02/05/24 12:26 PM  Specimen: Urine, Clean Catch; Nasal Swab  Result Value Ref Range Status   MRSA by PCR Next Gen NOT DETECTED NOT DETECTED Final    Comment: (NOTE) The GeneXpert MRSA Assay (FDA approved for NASAL specimens only), is one component of a comprehensive MRSA colonization surveillance program. It is not intended to diagnose MRSA infection nor to guide or monitor treatment for MRSA infections. Test performance is not FDA approved in patients less than 51 years old. Performed at [facility], [address] [place], [place] [address]    Radiology Studies: No results found.  Scheduled Meds:  arformoterol   15 mcg Nebulization BID   atorvastatin   20 mg Oral Daily   budesonide  (PULMICORT ) nebulizer solution  0.5 mg Nebulization BID   Chlorhexidine  Gluconate Cloth  6 each Topical Daily   enoxaparin  (LOVENOX ) injection  30 mg Subcutaneous Daily   folic acid   1 mg Oral  Daily   pantoprazole   40 mg Oral Daily   predniSONE   40 mg Oral Q breakfast   revefenacin   175 mcg Nebulization Daily   senna  1 tablet Oral BID   Continuous Infusions:   LOS: 2 days    Time spent: 35 mins    Darcel Dawley, MD [facility]   If 7PM-7AM, please contact night-coverage  "

## 2024-02-07 NOTE — Evaluation (Signed)
 Physical Therapy Evaluation Patient Details Name: Donna Watkins MRN: [id number] DOB: 1960-04-08 Today's Date: 02/07/2024  History of Present Illness  64 y.o. 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  She returned back from her facility with complaints of confusion, removing her oxygen which caused desaturation.  Nursing home staff reports that they were unable to manage her due to her confusion and not keeping her oxygen.  Clinical Impression  On eval, pt was CGA-Min A for mobility. She ambulated ~100 feet with a rollator. O2 dropped to 85% on 3L with ambulation. Once back in room, briefly placed pt on 4L on O2 tank to aid sat recovery. With time and cues for pursed lip breathing, sats recovered to 97%-placed pt back on 3L wall O2. Per chart review, pt admitted from SNF. Patient will benefit from continued inpatient follow up therapy, <3 hours/day.       If plan is discharge home, recommend the following: A little help with walking and/or transfers;Assist for transportation;Assistance with cooking/housework;Supervision due to cognitive status;Help with stairs or ramp for entrance   Can travel by private vehicle   Yes    Equipment Recommendations  (TBD at next venue)  Recommendations for Other Services       Functional Status Assessment Patient has had a recent decline in their functional status and demonstrates the ability to make significant improvements in function in a reasonable and predictable amount of time.     Precautions / Restrictions Precautions Precautions: Fall Precaution/Restrictions Comments: O2 dep at baseline-monitor O2 Restrictions Weight Bearing Restrictions Per Provider  Order: No      Mobility  Bed Mobility Overal bed mobility: Needs Assistance Bed Mobility: Supine to Sit, Sit to Supine     Supine to sit: Supervision, HOB elevated Sit to supine: Supervision, HOB elevated   General bed mobility comments: Supv for safety, lines.    Transfers Overall transfer level: Needs assistance Equipment used: Rollator (4 wheels) Transfers: Sit to/from Stand Sit to Stand: Contact guard assist           General transfer comment: Cues for safety.    Ambulation/Gait Ambulation/Gait assistance: Min assist, Contact guard assist Gait Distance (Feet): 100 Feet Assistive device: Rollator (4 wheels) Gait Pattern/deviations: Step-through pattern, Decreased stride length       General Gait Details: Intermittent assist to steady, especially as distance increased and pt began to have dyspnea. O2 85% on 3L with ambulation (likely requires 4L for activity). Cues for safety.  Stairs            Wheelchair Mobility     Tilt Bed    Modified Rankin (Stroke Patients Only)       Balance Overall balance assessment: Needs assistance         Standing balance support: During functional activity, Reliant on assistive device for balance Standing balance-Leahy Scale: Fair                               Pertinent Vitals/Pain Pain Assessment Pain Assessment: No/denies pain    Home Living Family/patient expects to be discharged to:: Skilled nursing facility Living Arrangements: Other relatives Available Help at Discharge: Family;Available 24 hours/day Type of Home:  House Home Access: Stairs to enter Entrance Stairs-Rails: Right;Left;Can reach both Entrance Stairs-Number of Steps: 4 steps in back with bil side rails, 4 steps in front without rails.   Home Layout: One level Home Equipment: Grab bars - tub/shower;Shower Counsellor (2 wheels);Wheelchair - manual Additional Comments: Pt is a poor historian did state she lived with  her sister unclear on amount of steps, whether it was a house.( Information was taken from previous note.)    Prior Function Prior Level of Function : Needs assist;Patient poor historian/Family not available                     Extremity/Trunk Assessment   Upper Extremity Assessment Upper Extremity Assessment: Defer to OT evaluation    Lower Extremity Assessment Lower Extremity Assessment: Generalized weakness    Cervical / Trunk Assessment Cervical / Trunk Assessment: Kyphotic  Communication   Communication Communication: No apparent difficulties    Cognition Arousal: Alert Behavior During Therapy: WFL for tasks assessed/performed                           PT - Cognition Comments: mildly impulsive Following commands: Intact Following commands impaired: Only follows one step commands consistently     Cueing Cueing Techniques: Verbal cues     General Comments General comments (skin integrity, edema, etc.): 3L O2 via [place] on during session.    Exercises     Assessment/Plan    PT Assessment Patient needs continued PT services  PT Problem List Decreased activity tolerance;Decreased balance;Decreased mobility;Decreased strength;Decreased coordination;Decreased safety awareness       PT Treatment Interventions DME instruction;Gait training;Functional mobility training;Therapeutic activities;Therapeutic exercise;Patient/family education;Balance training    PT Goals (Current goals can be found in the Care Plan section)  Acute Rehab PT Goals Patient Stated Goal: to get better PT Goal Formulation: With patient Time For Goal Achievement: 02/21/24 Potential to Achieve Goals: Good    Frequency Min 2X/week     Co-evaluation               AM-PAC PT 6 Clicks Mobility  Outcome Measure Help needed turning from your back to your side while in a flat bed without using bedrails?: None Help needed moving from lying on your back to sitting on the side  of a flat bed without using bedrails?: None Help needed moving to and from a bed to a chair (including a wheelchair)?: A Little Help needed standing up from a chair using your arms (e.g., wheelchair or bedside chair)?: A Little Help needed to walk in hospital room?: A Little Help needed climbing 3-5 steps with a railing? : A Lot 6 Click Score: 19    End of Session Equipment Utilized During Treatment: Gait belt;Oxygen Activity Tolerance: Patient tolerated treatment well Patient left: in bed;with call bell/phone within reach;with bed alarm set   PT Visit Diagnosis: Difficulty in walking, not elsewhere classified (R26.2);Other abnormalities of gait and mobility (R26.89)    Time: 8574-8557 PT Time Calculation (min) (ACUTE ONLY): 17 min   Charges:   PT Evaluation $PT Eval Low Complexity: 1 Low   PT General Charges $$ ACUTE PT VISIT: 1 Visit           Dannial SQUIBB, PT Acute Rehabilitation  Office: [phone]

## 2024-02-08 LAB — BASIC METABOLIC PANEL WITH GFR
Anion gap: 3 — ABNORMAL LOW (ref 5–15)
BUN: 16 mg/dL (ref 8–23)
CO2: 44 mmol/L — ABNORMAL HIGH (ref 22–32)
Calcium: 9.1 mg/dL (ref 8.9–10.3)
Chloride: 94 mmol/L — ABNORMAL LOW (ref 98–111)
Creatinine, Ser: 0.42 mg/dL — ABNORMAL LOW (ref 0.44–1.00)
GFR, Estimated: >60 mL/min
Glucose, Bld: 97 mg/dL (ref 70–99)
Potassium: 4 mmol/L (ref 3.5–5.1)
Sodium: 141 mmol/L (ref 135–145)

## 2024-02-08 LAB — MAGNESIUM: Magnesium: 2 mg/dL (ref 1.7–2.4)

## 2024-02-08 LAB — CBC
HCT: 37.7 % (ref 36.0–46.0)
Hemoglobin: 11.1 g/dL — ABNORMAL LOW (ref 12.0–15.0)
MCH: 30 pg (ref 26.0–34.0)
MCHC: 29.4 g/dL — ABNORMAL LOW (ref 30.0–36.0)
MCV: 101.9 fL — ABNORMAL HIGH (ref 80.0–100.0)
Platelets: 197 10*3/uL (ref 150–400)
RBC: 3.7 MIL/uL — ABNORMAL LOW (ref 3.87–5.11)
RDW: 12.1 % (ref 11.5–15.5)
WBC: 7.8 10*3/uL (ref 4.0–10.5)
nRBC: 0 % (ref 0.0–0.2)

## 2024-02-08 LAB — PHOSPHORUS: Phosphorus: 2.8 mg/dL (ref 2.5–4.6)

## 2024-02-08 NOTE — Progress Notes (Signed)
 Mobility Specialist - Progress Note  Pre-mobility: 93 bpm HR, 94% SpO2 ([place] 3L) During mobility: 139 bpm HR, 87% SpO2 ([place] 4L) Post-mobility: 83 bpm HR, 91% SPO2 ([place] 3L)   02/08/24 1239  Mobility  Activity Ambulated with assistance  Level of Assistance Contact guard assist, steadying assist  Assistive Device None  Distance Ambulated (ft) 50 ft  Range of Motion/Exercises Active  Activity Response Tolerated fair  Mobility visit 1 Mobility  Mobility Specialist Start Time (ACUTE ONLY) 1228  Mobility Specialist Stop Time (ACUTE ONLY) 1239  Mobility Specialist Time Calculation (min) (ACUTE ONLY) 11 min   Pt was found in bed and agreeable to mobilize. Increased to [facility] during ambulation per RN. Pt had x1 standing rest break upon exiting room due to increased HR. C/o of palpitations with ambulation. Upon returning to room SPO2 decreased to 87% although able to increase >90% within 30 sec with encouraged pursed lip breathing. At EOS returned to bed with all needs met, declined sitting on recliner chair. Call bell in reach. RN notified.   Erminio Leos,  Mobility Specialist Can be reached via Secure Chat

## 2024-02-08 NOTE — Plan of Care (Signed)

## 2024-02-08 NOTE — Progress Notes (Signed)
 Mobility Specialist - Progress Note  Pre-mobility: 101 bpm HR, 95% SpO2 ([place] 3L) During mobility: 144 bpm HR, 89% SpO2 ([place] 4L) Post-mobility: 101 bpm HR, 96% SPO2 ([place] 3L)   02/08/24 1531  Mobility  Activity Ambulated with assistance  Level of Assistance Contact guard assist, steadying assist  Assistive Device Other (Comment) (Hallway Rail)  Distance Ambulated (ft) 50 ft  Range of Motion/Exercises Active  Activity Response Tolerated fair  Mobility visit 1 Mobility  Mobility Specialist Start Time (ACUTE ONLY) 1515  Mobility Specialist Stop Time (ACUTE ONLY) 1531  Mobility Specialist Time Calculation (min) (ACUTE ONLY) 16 min   Pt was found bed and agreeable to mobilize. Had x1 seated rest break for ~1 min due to increase HR and able to lower to 120 bpm. C/o of palpitations. At EOS returned to bed with all needs met. Call bell in reach. RN notified.   Erminio Leos,  Mobility Specialist Can be reached via Secure Chat

## 2024-02-09 ENCOUNTER — Other Ambulatory Visit (HOSPITAL_COMMUNITY): Payer: Self-pay

## 2024-02-09 MED ORDER — PREDNISONE 20 MG PO TABS: 20.0000 mg | ORAL_TABLET | Freq: Every day | ORAL | 0 refills | Status: AC

## 2024-02-09 MED ORDER — ALPRAZOLAM 0.5 MG PO TABS: 0.5000 mg | ORAL_TABLET | Freq: Three times a day (TID) | ORAL | 0 refills | Status: AC | PRN

## 2024-02-09 MED ORDER — PREDNISONE 20 MG PO TABS
20.0000 mg | ORAL_TABLET | Freq: Every day | ORAL | 0 refills | Status: DC
  Filled 2024-02-09: qty 3, 3d supply, fill #0

## 2024-02-09 NOTE — Telephone Encounter (Signed)
 Appointment has been made for 3/3 with Dr.[email or web address]

## 2024-02-09 NOTE — Progress Notes (Signed)
 Attempted to call and give report to [facility], no answer. Will try again later.

## 2024-02-09 NOTE — Progress Notes (Signed)
 PTAR left with patient. Attempted to call and give report to [facility] for last time and no answer. Line rang and directed to be placed on hold. This RN was on hold for about 18 minutes with no answer. Report not able to be given despite 6 attempts.

## 2024-02-09 NOTE — TOC Transition Note (Signed)
 Transition of Care [facility]) - Discharge Note   Patient Details  Name: Donna Watkins MRN: [id number] Date of Birth: 03/01/1960  Transition of Care [facility]) CM/SW Contact:  Tawni CHRISTELLA Eva, LCSW Phone Number: 02/09/2024, 11:38 AM   Clinical Narrative:     Pt auth was approved shara PI#[id number] from 02/07/2024-02/11/2024. pt to d/c to [facility] for SNF placement. Pt's room is 312 , RN to call report to [phone] ex [phone]. PTAR called , no further , ICM needs.   Final next level of care: Skilled Nursing Facility Barriers to Discharge: Barriers Resolved   Patient Goals and CMS Choice Patient states their goals for this hospitalization and ongoing recovery are:: SNF to get stonger CMS Medicare.gov Compare Post Acute Care list provided to:: Patient Choice offered to / list presented to : Patient      Discharge Placement                    Patient and family notified of of transfer: 02/09/24  Discharge Plan and Services Additional resources added to the After Visit Summary for                                       Social Drivers of Health (SDOH) Interventions SDOH Screenings   Food Insecurity: No Food Insecurity (02/05/2024)  Housing: Low Risk (02/05/2024)  Transportation Needs: No Transportation Needs (02/05/2024)  Utilities: Not At Risk (02/05/2024)  Alcohol Screen: Low Risk (06/23/2022)  Depression (PHQ2-9): Low Risk (08/07/2022)  Financial Resource Strain: Low Risk (08/03/2022)  Physical Activity: Insufficiently Active (08/03/2022)   Received from [facility]  Social Connections: Unknown (01/29/2024)  Stress: No Stress Concern Present (08/03/2022)  Tobacco Use: Medium Risk (02/05/2024)  Health Literacy: Low Risk (08/03/2022)   Received from [facility]     Readmission Risk Interventions    03/27/2022    1:15 PM 02/06/2022    2:58 PM  Readmission Risk Prevention Plan  Transportation Screening Complete Complete  Home Care Screening  Complete   Medication Review (RN CM)  Complete  HRI or Home Care Consult Complete   Social Work Consult for Recovery Care Planning/Counseling Complete   Palliative Care Screening Not Applicable   Medication Review Oceanographer) Complete

## 2024-02-09 NOTE — Plan of Care (Signed)
  Problem: Clinical Measurements: Goal: Ability to maintain clinical measurements within normal limits will improve Outcome: Progressing   Problem: Activity: Goal: Risk for activity intolerance will decrease Outcome: Progressing   Problem: Coping: Goal: Level of anxiety will decrease Outcome: Progressing   Problem: Safety: Goal: Ability to remain free from injury will improve Outcome: Progressing   Problem: Skin Integrity: Goal: Risk for impaired skin integrity will decrease Outcome: Progressing   

## 2024-02-09 NOTE — Progress Notes (Signed)
 Mobility Specialist - Progress Note  ([place] 3L) Pre-mobility: 66 bpm HR, 97% SpO2 During mobility: 139 bpm HR, 90% SpO2 Post-mobility: 110 bpm HR, 93% SPO2   02/09/24 1010  Mobility  Activity Ambulated with assistance  Level of Assistance Contact guard assist, steadying assist  Assistive Device Other (Comment) (Hallway Rail)  Distance Ambulated (ft) 50 ft  Range of Motion/Exercises Active  Activity Response Tolerated fair  Mobility visit 1 Mobility  Mobility Specialist Start Time (ACUTE ONLY) 0955  Mobility Specialist Stop Time (ACUTE ONLY) 1010  Mobility Specialist Time Calculation (min) (ACUTE ONLY) 15 min   Pt was found in bed and agreeable to mobilize. Grew SOB with ambulation and c/o palpitations. At EOS returned to bed with all needs met. Call bell in reach. RN notified of session.   Erminio Leos,  Mobility Specialist Can be reached via Secure Chat

## 2024-02-09 NOTE — Progress Notes (Signed)
 Attempted to call report a second time, still no answer.

## 2024-02-09 NOTE — Discharge Summary (Addendum)
 " Physician Discharge Summary   Patient: Donna Watkins MRN: [id number] DOB: [birth year]  Admit date:     02/05/2024  Discharge date: 02/09/24  Discharge Physician: Elgie Butter   PCP: Shona Norleen PEDLAR, MD   Recommendations at discharge:  Please follow up with outpatient palliative care Please follow up pulmonology in 2 weeks.  Please follow up with CBC and bmp in one week.    Discharge Diagnoses: Principal Problem:   Acute on chronic respiratory failure with hypoxia and hypercapnia (HCC) Hypertension Hyperlipidemia Anxiety Depression.   Hospital Course: Donna Watkins is a 64 year old female with a past medical history significant for HTN, HLD, chronic hypoxic respiratory failure, end-stage COPD, severe anxiety, and depression who presented to the ED at [facility] on 1/31 for c/o confusion.  Of note patient was recently discharged from [facility] 1/30 (I.e. just 1 day earlier) after prolonged hospital stay resulting in need for intubation for acute hypercapnic respiratory failure with underlying COPD exacerbation and presumed pneumonia.      Assessment and Plan:     Acute on chronic hypoxic and hypercapnic respiratory failure: End-stage COPD: Acute metabolic encephalopathy: Patient presented from nursing home with confusion and acute hypoxia. Most likely due to removing her oxygen, resulting in acute on chronic hypoxia as well as hypercapnia.   Chest x-ray without worsening effusion or evidence of pneumonia. She denies any fevers, no leukocytosis.   She has some hypernatremia but not significantly different from previous. Monitor closely in progressive unit. Patient was placed on BiPAP which subsequently made her better. Continue BiPAP as needed and at night. Pulmonology is consulted, continue BiPAP as needed and at night. Recommended she would be allowed to take dose of Xanax  prior to putting on mask each night.  When ready for discharge resume Trelegy Ellipta . Plan a  quick taper of prednisone .   Anxiety, benzodiazepine dependence: Continue Xanax  0.5 mg p.o. 3 times daily as needed, with plan for prolonged taper.   Hypernatremia: This has been a chronic problem for the patient, currently her sodium level is not significantly different from baseline and I do not suspect this is causing her encephalopathy. Resolved.   Moderate protein calorie malnutrition; Ensure.   Seizure-like activity:  During her last admission, she had unremarkable MRI of the brain as well as prolonged EEG.   Neurology felt this was likely myoclonic jerking from her hypoxia.   No myoclonic jerking or loss of consciousness seen thus far this hospitalization.   Sacral pressure injury-stage I, present on admission -Sacral foam -Every 2 hours turns   Thrombocytopenia; resolved.      Consultants: PCCM Procedures performed: none  Disposition: Skilled nursing facility Diet recommendation:   DISCHARGE MEDICATION: Allergies as of 02/09/2024   No Known Allergies      Medication List     TAKE these medications    albuterol  108 (90 Base) MCG/ACT inhaler Commonly known as: VENTOLIN  HFA Inhale 2 puffs into the lungs every 4 (four) hours as needed for wheezing or shortness of breath.   alendronate 70 MG tablet Commonly known as: FOSAMAX Take 70 mg by mouth every Monday. Take with a full glass of water  on an empty stomach.   ALPRAZolam  0.5 MG tablet Commonly known as: XANAX  Take 1 tablet (0.5 mg total) by mouth every 8 (eight) hours as needed for up to 3 days for anxiety.   atorvastatin  20 MG tablet Commonly known as: LIPITOR Take 1 tablet (20 mg total) by mouth daily. Restart on 04/03/22  CENTRUM SILVER PO Take 1 tablet by mouth daily with breakfast.   folic acid  1 MG tablet Commonly known as: FOLVITE  Take 1 tablet (1 mg total) by mouth daily.   ipratropium-albuterol  0.5-2.5 (3) MG/3ML Soln Commonly known as: DUONEB Take 3 mLs by nebulization 3 (three) times  daily. And as needed for SOB What changed:  when to take this reasons to take this additional instructions   mirtazapine  15 MG tablet Commonly known as: REMERON  Take 1 tablet (15 mg total) by mouth daily. What changed: when to take this   mupirocin ointment 2 % Commonly known as: BACTROBAN Apply 1 Application topically See admin instructions. Apply to the affected area in the morning   omeprazole 20 MG capsule Commonly known as: PRILOSEC Take 20 mg by mouth daily before breakfast.   OXYGEN Inhale 3 L/min into the lungs continuous.   predniSONE  20 MG tablet Commonly known as: DELTASONE  Take 1 tablet (20 mg total) by mouth daily with breakfast for 3 days. Start taking on: February 10, 2024 What changed:  how much to take additional instructions   senna 8.6 MG Tabs tablet Commonly known as: SENOKOT Take 1 tablet (8.6 mg total) by mouth 2 (two) times daily. What changed: when to take this   Trelegy Ellipta  200-62.5-25 MCG/ACT Aepb Generic drug: Fluticasone -Umeclidin-Vilant Inhale 1 puff into the lungs 2 (two) times daily. What changed: when to take this        Discharge Exam: Filed Weights   02/05/24 1223  Weight: 42 kg  General exam: Appears calm and comfortable  Respiratory system: Clear to auscultation. Respiratory effort normal. Cardiovascular system: S1 & S2 heard, RRR.  Gastrointestinal system: Abdomen is nondistended, soft and nontender.  Central nervous system: Alert and oriented.  Extremities: Symmetric 5 x 5 power. Skin: No rashes,  Psychiatry:  Mood & affect appropriate.    Condition at discharge: fair  The results of significant diagnostics from this hospitalization (including imaging, microbiology, ancillary and laboratory) are listed below for reference.   Imaging Studies: DG Chest 2 View Result Date: 02/05/2024 EXAM: 2 VIEW(S) XRAY OF THE CHEST 02/05/2024 05:06:00 AM COMPARISON: 02/01/2024 CLINICAL HISTORY: Shortness of breath. FINDINGS: LUNGS  AND PLEURA: Emphysema. Hyperinflation with flattened diaphragms, compatible with COPD. Blunting of right costophrenic angle. No focal pulmonary opacity. No pneumothorax. HEART AND MEDIASTINUM: Surgical clip in left suprahilar region. BONES AND SOFT TISSUES: No acute osseous abnormality. IMPRESSION: 1. Emphysema and hyperinflation with flattened diaphragms, compatible with COPD. 2. Blunting of the right costophrenic angle, which may reflect pleural parenchymal scarring versus small right effusion. . Electronically signed by: Waddell Calk MD 02/05/2024 07:14 AM EST RP Workstation: [id number]   DG CHEST PORT 1 VIEW Result Date: 02/01/2024 EXAM: 1 VIEW(S) XRAY OF THE CHEST 02/01/2024 04:17:00 PM COMPARISON: 01/27/2024, 03/25/2022 CT chest 123/226 CLINICAL HISTORY: Chest crackles. FINDINGS: LUNGS AND PLEURA: Emphysema. Improving left retrocardiac opacity. Blunting of bilateral costophrenic angles with possible trace pleural effusions. No pneumothorax. HEART AND MEDIASTINUM: Aortic arch atherosclerosis. No acute abnormality of the cardiac and mediastinal silhouettes. BONES AND SOFT TISSUES: No acute osseous abnormality. IMPRESSION: 1. Improving left retrocardiac opacity. 2. Blunting of bilateral costophrenic angles with possible trace pleural effusions. Electronically signed by: Morgane Naveau MD 02/01/2024 11:45 PM EST RP Workstation: [id number]   ECHOCARDIOGRAM COMPLETE Result Date: 02/01/2024    ECHOCARDIOGRAM REPORT   Patient Name:   DONNAMARIE SHANKLES Date of Exam: 02/01/2024 Medical Rec #:  [id number]         Height:  63.0 in Accession #:    [id number]        Weight:       99.4 lb Date of Birth:  [birth year]        BSA:          1.437 m Patient Age:    63 years          BP:           137/72 mmHg Patient Gender: F                 HR:           87 bpm. Exam Location:  Inpatient Procedure: 2D Echo, Cardiac Doppler and Color Doppler (Both Spectral and Color            Flow Doppler were utilized during  procedure). Indications:    Other cardiac sounds R01.2  History:        Patient has prior history of Echocardiogram examinations, most                 recent 11/26/2021. COPD and hx of cancer; Risk                 Factors:Dyslipidemia, Former Smoker and GERD.  Sonographer:    Koleen Popper RDCS Referring Phys: [id number] CHRISTOPHER P DANFORD  Sonographer Comments: Image acquisition challenging due to COPD and Image acquisition challenging due to patient body habitus. IMPRESSIONS  1. Left ventricular ejection fraction, by estimation, is 60 to 65%. The left ventricle has normal function. The left ventricle has no regional wall motion abnormalities. Left ventricular diastolic parameters were normal.  2. Right ventricular systolic function is normal. The right ventricular size is normal.  3. The mitral valve is normal in structure. No evidence of mitral valve regurgitation.  4. The aortic valve is tricuspid. Aortic valve regurgitation is mild to moderate. No aortic stenosis is present.  5. The inferior vena cava is dilated in size with >50% respiratory variability, suggesting right atrial pressure of 8 mmHg.  6. Cannot exclude a small PFO. There is a small patent foramen ovale with predominantly left to right shunting across the atrial septum. Comparison(s): No significant change from prior study. Prior images reviewed side by side. FINDINGS  Left Ventricle: Left ventricular ejection fraction, by estimation, is 60 to 65%. The left ventricle has normal function. The left ventricle has no regional wall motion abnormalities. The left ventricular internal cavity size was normal in size. There is  no left ventricular hypertrophy. Left ventricular diastolic parameters were normal. Right Ventricle: The right ventricular size is normal. No increase in right ventricular wall thickness. Right ventricular systolic function is normal. Left Atrium: Left atrial size was normal in size. Right Atrium: Right atrial size was normal in  size. Pericardium: There is no evidence of pericardial effusion. Mitral Valve: The mitral valve is normal in structure. No evidence of mitral valve regurgitation. Tricuspid Valve: The tricuspid valve is normal in structure. Tricuspid valve regurgitation is not demonstrated. Aortic Valve: The aortic valve is tricuspid. Aortic valve regurgitation is mild to moderate. Aortic regurgitation PHT measures 143 msec. No aortic stenosis is present. Pulmonic Valve: The pulmonic valve was grossly normal. Pulmonic valve regurgitation is not visualized. No evidence of pulmonic stenosis. Aorta: The aortic root is normal in size and structure. Venous: The inferior vena cava is dilated in size with greater than 50% respiratory variability, suggesting right atrial pressure of 8 mmHg. IAS/Shunts: Cannot exclude a small PFO. A small patent  foramen ovale is detected with predominantly left to right shunting across the atrial septum.  LEFT VENTRICLE PLAX 2D LVIDd:         5.00 cm      Diastology LVIDs:         3.40 cm      LV e' medial:    6.85 cm/s LV PW:         0.80 cm      LV E/e' medial:  9.6 LV IVS:        0.70 cm      LV e' lateral:   12.60 cm/s LVOT diam:     2.10 cm      LV E/e' lateral: 5.2 LV SV:         65 LV SV Index:   46 LVOT Area:     3.46 cm  LV Volumes (MOD) LV vol d, MOD A2C: 138.0 ml LV vol d, MOD A4C: 92.8 ml LV vol s, MOD A2C: 59.9 ml LV vol s, MOD A4C: 42.3 ml LV SV MOD A2C:     78.1 ml LV SV MOD A4C:     92.8 ml LV SV MOD BP:      64.3 ml RIGHT VENTRICLE             IVC RV Basal diam:  3.80 cm     IVC diam: 2.30 cm RV S prime:     20.10 cm/s TAPSE (M-mode): 2.4 cm LEFT ATRIUM             Index        RIGHT ATRIUM           Index LA diam:        2.30 cm 1.60 cm/m   RA Area:     11.70 cm LA Vol (A2C):   31.1 ml 21.65 ml/m  RA Volume:   27.40 ml  19.07 ml/m LA Vol (A4C):   15.3 ml 10.65 ml/m LA Biplane Vol: 21.6 ml 15.03 ml/m  AORTIC VALVE LVOT Vmax:   106.00 cm/s LVOT Vmean:  68.100 cm/s LVOT VTI:    0.189 m  AI PHT:      143 msec  AORTA Ao Root diam: 3.30 cm MITRAL VALVE MV Area (PHT): 3.91 cm    SHUNTS MV Decel Time: 194 msec    Systemic VTI:  0.19 m MV E velocity: 65.70 cm/s  Systemic Diam: 2.10 cm MV A velocity: 81.30 cm/s MV E/A ratio:  0.81 Mihai Croitoru MD Electronically signed by Jerel Balding MD Signature Date/Time: 02/01/2024/4:50:36 PM    Final    Overnight EEG with video Result Date: 01/28/2024 Shelton Arlin KIDD, MD     01/29/2024  9:58 AM Patient Name: JAKAIYA NETHERLAND MRN: [id number] Epilepsy Attending: Arlin KIDD Shelton Referring Physician/Provider: Daren Ronnald FORBES, NP Duration: 01/27/2024 1756 to 01/28/2024 1756 Patient history: 64 y.o. female with hx of end-stage COPD, hyperlipidemia, lung cancer, anxiety and depression who presents with altered mental status and possible seizure activity. EEG to evaluate for seizure Level of alertness: comatose/ lethargic --->awake AEDs during EEG study: Propofol  Technical aspects: This EEG study was done with scalp electrodes positioned according to the 10-20 International system of electrode placement. Electrical activity was reviewed with band pass filter of 1-70Hz , sensitivity of 7 uV/mm, display speed of 34mm/sec with a 60Hz  notched filter applied as appropriate. EEG data were recorded continuously and digitally stored.  Video monitoring was available and reviewed as appropriate. Description: EEG showed continuous generalized  3 to 6 Hz theta-delta slowing admixed with 12-15hz  beta activity distributed symmetrically and diffusely. As sedation was weaned, eeg improved and showed continuous generalized predominantly 5-7hz  theta slowing. Hyperventilation and photic stimulation were not performed.   ABNORMALITY - Continuous slow, generalized IMPRESSION: This study is suggestive of generalized cerebral dysfunction (encephalopathy) nonspecific etiology but likely related to sedation. No seizures or epileptiform discharges were seen throughout the recording. Arlin MALVA Krebs   MR BRAIN W WO CONTRAST Result Date: 01/28/2024 EXAM: MRI BRAIN WITH CONTRAST 01/28/2024 03:15:24 AM TECHNIQUE: Multiplanar multisequence MRI of the head/brain was performed with the administration of intravenous contrast. COMPARISON: MRI Head 08/23/20 CLINICAL HISTORY: Seizure disorder, clinical change FINDINGS: BRAIN AND VENTRICLES: No acute infarct. No acute intracranial hemorrhage. No mass effect or midline shift. No hydrocephalus. Similar Chiari malformation with approximately 6 mm of inferior tonsillar descent. Mild patchy white matter T2 hyperintensities, compatible with chronic microvascular ischemic change. Normal flow voids. ORBITS: No significant abnormality. SINUSES: No significant abnormality. BONES AND SOFT TISSUES: Normal bone marrow signal. No soft tissue abnormality. IMPRESSION: 1. No acute abnormality. 2. Unchanged Chiari malformation. Electronically signed by: Glendia Molt MD 01/28/2024 03:43 AM EST RP Workstation: [id number]   CT CHEST ABDOMEN PELVIS W CONTRAST Result Date: 01/28/2024 EXAM: CT CHEST, ABDOMEN AND PELVIS WITH CONTRAST 01/28/2024 03:23:58 AM TECHNIQUE: CT of the chest, abdomen and pelvis was performed with the administration of 100 mL of iohexol  (OMNIPAQUE ) 300 MG/ML solution. Multiplanar reformatted images are provided for review. Automated exposure control, iterative reconstruction, and/or weight based adjustment of the mA/kV was utilized to reduce the radiation dose to as low as reasonably achievable. COMPARISON: CT chest dated 11/02/2023 and CT abdomen/pelvis dated 03/25/2022. CLINICAL HISTORY: Sepsis. FINDINGS: CHEST: MEDIASTINUM AND LYMPH NODES: Heart and pericardium are unremarkable. Endotracheal tube terminates 5 cm above the carina. The central airways are clear. No mediastinal, hilar or axillary lymphadenopathy. Mild thoracic aortic atherosclerosis. LUNGS AND PLEURA: Severe centrilobular and paraseptal emphysematous changes, upper lung predominant. Mild patchy  opacities in the lingula and left lower lobe, suspicious for pneumonia. Mild right basilar opacity, also likely reflecting pneumonia in the clinical context. No pleural effusion. No pneumothorax. ABDOMEN AND PELVIS: LIVER: Subcentimeter right hepatic cyst, benign. GALLBLADDER AND BILE DUCTS: Cholelithiasis with gallbladder wall calcification, without associated inflammatory changes. No biliary ductal dilatation. SPLEEN: No acute abnormality. PANCREAS: No acute abnormality. ADRENAL GLANDS: No acute abnormality. KIDNEYS, URETERS AND BLADDER: No stones in the kidneys or ureters. No hydronephrosis. No perinephric or periureteral stranding. Bladder decompressed by an indwelling foley catheter. GI AND BOWEL: Enteric tube terminates in the mid stomach. Stomach demonstrates no acute abnormality. There is no bowel obstruction. REPRODUCTIVE ORGANS: Uterus is within normal limits. No acute abnormality. PERITONEUM AND RETROPERITONEUM: No ascites. No free air. VASCULATURE: Aorta is normal in caliber. Mild abdominal aortic atherosclerosis. ABDOMINAL AND PELVIS LYMPH NODES: No lymphadenopathy. BONES AND SOFT TISSUES: No acute osseous abnormality. No focal soft tissue abnormality. IMPRESSION: 1. Multifocal pneumonia, left lower lobe predominant. 2. Cholelithiasis with gallbladder wall calcification, without associated inflammatory changes. 3. Additional ancillary findings, as above. 4. Aortic Atherosclerosis (ICD10-I70.0). 5. Emphysema (ICD10-J43.9). Electronically signed by: Pinkie Pebbles MD 01/28/2024 03:34 AM EST RP Workstation: [id number]   DG Chest Port 1 View Result Date: 01/27/2024 CLINICAL DATA:  Endotracheal tube evaluation. EXAM: PORTABLE CHEST 1 VIEW COMPARISON:  01/27/2024 at 1:24 p.m. FINDINGS: Endotracheal tube has tip with 2 cm above the carina. Nasogastric tube has side-port over the stomach in the left upper quadrant with tip not visualized.  Lungs are adequately inflated worsening opacification over the left  base/retrocardiac region likely an effusion with associated basilar atelectasis. Infection over the mid to lower left lung is possible. Remainder of the lungs are unchanged. Cardiomediastinal silhouette and remainder of the exam is unchanged. IMPRESSION: 1. Worsening opacification over the left base/retrocardiac region likely an effusion with associated basilar atelectasis. Infection over the mid to lower left lung is possible. 2. Tubes and lines as described. Electronically Signed   By: Toribio Agreste M.D.   On: 01/27/2024 16:45   DG Chest Portable 1 View Result Date: 01/27/2024 CLINICAL DATA:  Status post intubation and orogastric tube placement. EXAM: PORTABLE CHEST 1 VIEW COMPARISON:  January 27, 2024 (10:58 a.m.) FINDINGS: Limited study secondary to patient rotation. Endotracheal tube is seen with its distal tip approximately 8.9 cm from the carina. An orogastric tube is in place with its distal end extending into the body of the stomach. The heart size and mediastinal contours are within normal limits. Low lung volumes are noted on the left with mild elevation of the left hemidiaphragm. Mild to moderate severity left basilar atelectasis and/or infiltrate is seen. Mild to moderate severity scarring, atelectasis and/or infiltrate is seen along the medial aspect of the left upper lobe, adjacent to the superior mediastinum. A small adjacent radiopaque surgical clip is present. There is a small left pleural effusion. The right lung is clear. No pneumothorax is identified. The visualized skeletal structures are unremarkable. IMPRESSION: 1. Endotracheal tube and orogastric tube positioning, as described above. 2. Mild to moderate severity left basilar atelectasis and/or infiltrate. 3. Mild to moderate severity scarring, atelectasis and/or infiltrate along the medial aspect of the left upper lobe. 4. Small left pleural effusion. Electronically Signed   By: Suzen Dials M.D.   On: 01/27/2024 13:37   CT Head Wo  Contrast Result Date: 01/27/2024 EXAM: CT HEAD WITHOUT CONTRAST 01/27/2024 01:19:37 PM TECHNIQUE: CT of the head was performed without the administration of intravenous contrast. Automated exposure control, iterative reconstruction, and/or weight based adjustment of the mA/kV was utilized to reduce the radiation dose to as low as reasonably achievable. COMPARISON: None available. CLINICAL HISTORY: Mental status change, unknown cause FINDINGS: BRAIN AND VENTRICLES: No acute hemorrhage. No evidence of acute infarct. No hydrocephalus. No extra-axial collection. No mass effect or midline shift. Similar 6-7 mm inferior cerebellar tonsillar descent at the foramen magnum, compatible with Chiari I malformation. ORBITS: No acute abnormality. SINUSES: No acute abnormality. SOFT TISSUES AND SKULL: No acute soft tissue abnormality. No skull fracture. IMPRESSION: 1. No acute intracranial abnormality. 2. Stable Chiari I malformation. Electronically signed by: Glendia Molt MD 01/27/2024 01:30 PM EST RP Workstation: [id number]   DG Chest Port 1 View Result Date: 01/27/2024 CLINICAL DATA:  Possible seizure.  History of left lung cancer. EXAM: PORTABLE CHEST 1 VIEW COMPARISON:  Chest x-ray 03/25/2022 and CT 11/02/2023 FINDINGS: Patient is rotated to the left. Lungs are adequately inflated. Postsurgical changes over the medial suprahilar region which are stable given differences in patient positioning. No focal lobar consolidation or effusion. Cardiomediastinal silhouette and remainder of the exam is unchanged. IMPRESSION: 1. No acute cardiopulmonary disease. 2. Stable postsurgical changes over the medial suprahilar region. Electronically Signed   By: Toribio Agreste M.D.   On: 01/27/2024 11:10    Microbiology: Results for orders placed or performed during the hospital encounter of 02/05/24  MRSA Next Gen by PCR, Nasal     Status: None   Collection Time: 02/05/24 12:26 PM   Specimen:  Urine, Clean Catch; Nasal Swab  Result  Value Ref Range Status   MRSA by PCR Next Gen NOT DETECTED NOT DETECTED Final    Comment: (NOTE) The GeneXpert MRSA Assay (FDA approved for NASAL specimens only), is one component of a comprehensive MRSA colonization surveillance program. It is not intended to diagnose MRSA infection nor to guide or monitor treatment for MRSA infections. Test performance is not FDA approved in patients less than 15 years old. Performed at [facility], [address], [place], [place] [address]     Labs: CBC: Recent Labs  Lab 02/05/24 0451 02/06/24 0308 02/08/24 0359  WBC 7.3 4.9 7.8  HGB 12.7 11.0* 11.1*  HCT 43.0 36.7 37.7  MCV 102.9* 102.8* 101.9*  PLT 243 173 197   Basic Metabolic Panel: Recent Labs  Lab 02/03/24 0235 02/05/24 0451 02/05/24 0612 02/06/24 0308 02/08/24 0359  NA 145 146* 147* 145 141  K 4.1 3.9 4.2 5.0 4.0  CL 91* 95* 94* 95* 94*  CO2 >45* >45* >45* >45* 44*  GLUCOSE 134* 127* 100* 98 97  BUN 17 29* 30* 29* 16  CREATININE 0.36* 0.63 0.64 0.43* 0.42*  CALCIUM  8.8* 10.0 9.8 9.1 9.1  MG  --   --   --   --  2.0  PHOS  --   --   --   --  2.8   Liver Function Tests: No results for input(s): AST, ALT, ALKPHOS, BILITOT, PROT, ALBUMIN in the last 168 hours. CBG: No results for input(s): GLUCAP in the last 168 hours.  Discharge time spent: 39 minutes.  Signed: Niguel Moure, MD [facility] 02/09/2024 "

## 2024-02-09 NOTE — Care Management Important Message (Signed)
 Important Message  Patient Details IM Letter given. Name: Donna Watkins MRN: [id number] Date of Birth: 10/12/1960   Important Message Given:  Yes - Medicare IM     Melba Ates 02/09/2024, 1:48 PM

## 2024-02-09 NOTE — Progress Notes (Signed)
 Attempted to call report 2 more times, no answer. Currently on hold trying for a 5th time.

## 2024-03-07 ENCOUNTER — Ambulatory Visit: Admitting: Internal Medicine

## 2024-05-15 ENCOUNTER — Ambulatory Visit: Admitting: Radiation Oncology
